# Patient Record
Sex: Male | Born: 1980
Health system: Southern US, Community
[De-identification: ages and names within clinical notes are randomized; demographics above are authoritative.]

## PROBLEM LIST (undated history)

## (undated) DIAGNOSIS — E079 Disorder of thyroid, unspecified: Secondary | ICD-10-CM

## (undated) DIAGNOSIS — C189 Malignant neoplasm of colon, unspecified: Secondary | ICD-10-CM

## (undated) DIAGNOSIS — I219 Acute myocardial infarction, unspecified: Secondary | ICD-10-CM

## (undated) DIAGNOSIS — F419 Anxiety disorder, unspecified: Secondary | ICD-10-CM

## (undated) DIAGNOSIS — M21371 Foot drop, right foot: Secondary | ICD-10-CM

## (undated) DIAGNOSIS — F4310 Post-traumatic stress disorder, unspecified: Secondary | ICD-10-CM

## (undated) DIAGNOSIS — I2699 Other pulmonary embolism without acute cor pulmonale: Secondary | ICD-10-CM

## (undated) DIAGNOSIS — K5090 Crohn's disease, unspecified, without complications: Secondary | ICD-10-CM

## (undated) DIAGNOSIS — F32A Depression, unspecified: Secondary | ICD-10-CM

## (undated) HISTORY — DX: Depression, unspecified: F32.A

## (undated) HISTORY — PX: COLON SURGERY: SHX602

## (undated) HISTORY — DX: Anxiety disorder, unspecified: F41.9

## (undated) HISTORY — DX: Post-traumatic stress disorder, unspecified: F43.10

## (undated) HISTORY — PX: OSTOMY: SHX5997

## (undated) HISTORY — DX: Foot drop, right foot: M21.371

## (undated) HISTORY — PX: ABDOMINAL SURGERY: SHX537

## (undated) HISTORY — PX: SMALL INTESTINE SURGERY: SHX150

## (undated) HISTORY — DX: Acute myocardial infarction, unspecified: I21.9

---

## 2019-04-18 ENCOUNTER — Encounter (HOSPITAL_COMMUNITY): Payer: Self-pay | Admitting: Urgent Care

## 2019-04-18 ENCOUNTER — Other Ambulatory Visit: Payer: Self-pay

## 2019-04-18 ENCOUNTER — Ambulatory Visit (HOSPITAL_COMMUNITY)
Admission: EM | Admit: 2019-04-18 | Discharge: 2019-04-18 | Disposition: A | Discharge status: Home / Self Care | Payer: Self-pay | Attending: Urgent Care | Admitting: Urgent Care

## 2019-04-18 DIAGNOSIS — K047 Periapical abscess without sinus: Secondary | ICD-10-CM

## 2019-04-18 DIAGNOSIS — R220 Localized swelling, mass and lump, head: Secondary | ICD-10-CM

## 2019-04-18 DIAGNOSIS — R519 Headache, unspecified: Secondary | ICD-10-CM

## 2019-04-18 HISTORY — DX: Disorder of thyroid, unspecified: E07.9

## 2019-04-18 HISTORY — DX: Crohn's disease, unspecified, without complications: K50.90

## 2019-04-18 MED ORDER — NAPROXEN 500 MG PO TABS: 500.0000 mg | ORAL_TABLET | Freq: Two times a day (BID) | ORAL | 0 refills | Status: DC

## 2019-04-18 MED ORDER — AMOXICILLIN-POT CLAVULANATE 875-125 MG PO TABS: 1.0000 | ORAL_TABLET | Freq: Two times a day (BID) | ORAL | 0 refills | Status: DC

## 2019-04-18 NOTE — ED Provider Notes (Signed)
[place]   MRN: [id number] DOB: 1980/10/06  Subjective:   Juan Hester is a 39 y.o. male presenting for 2-day history of cute onset moderate to severe worsening right sided facial pain, worse over lower jaw.  Patient states that he has broken teeth/molars on that side.  Denies having any dental care.  Denies taking chronic medications.  Allergies  Allergen Reactions  . Sulfa Antibiotics     Past Medical History:  Diagnosis Date  . Crohn disease ([place])   . Thyroid disease      Past Surgical History:  Procedure Laterality Date  . ABDOMINAL SURGERY    . COLON SURGERY    . SMALL INTESTINE SURGERY      History reviewed. No pertinent family history.  Social History   Tobacco Use  . Smoking status: Current Every Day Smoker    Packs/day: 1.00  . Smokeless tobacco: Never Used  Substance Use Topics  . Alcohol use: Yes  . Drug use: Not on file    Review of Systems  Constitutional: Negative for fever and malaise/fatigue.  HENT: Negative for congestion, ear pain, sinus pain and sore throat.   Eyes: Negative for discharge and redness.  Respiratory: Negative for cough, hemoptysis, shortness of breath and wheezing.   Cardiovascular: Negative for chest pain.  Gastrointestinal: Negative for abdominal pain, diarrhea, nausea and vomiting.  Genitourinary: Negative for dysuria, flank pain and hematuria.  Musculoskeletal: Negative for myalgias.  Skin: Negative for rash.  Neurological: Negative for dizziness, weakness and headaches.  Psychiatric/Behavioral: Negative for depression and substance abuse.     Objective:   Vitals: BP 118/79 (BP Location: Left Arm)   Pulse 78   Temp 98.2 F (36.8 C) (Oral)   Resp 16   Wt 260 lb (117.9 kg)   SpO2 99%   Physical Exam Constitutional:      General: He is not in acute distress.    Appearance: Normal appearance. He is well-developed and normal weight. He is not ill-appearing, toxic-appearing or diaphoretic.  HENT:     Head: Normocephalic and atraumatic.      Right Ear: Tympanic membrane, ear canal and external ear normal. There is no impacted cerumen.     Left Ear: Tympanic membrane, ear canal and external ear normal. There is no impacted cerumen.     Nose: Nose normal. No congestion or rhinorrhea.     Mouth/Throat:     Mouth: Mucous membranes are moist.     Pharynx: Oropharynx is clear. No oropharyngeal exudate or posterior oropharyngeal erythema.   Eyes:     General: No scleral icterus.       Right eye: No discharge.        Left eye: No discharge.     Extraocular Movements: Extraocular movements intact.     Conjunctiva/sclera: Conjunctivae normal.     Pupils: Pupils are equal, round, and reactive to light.  Cardiovascular:     Rate and Rhythm: Normal rate.  Pulmonary:     Effort: Pulmonary effort is normal.  Musculoskeletal:     Cervical back: Normal range of motion and neck supple. No rigidity. No muscular tenderness.  Neurological:     General: No focal deficit present.     Mental Status: He is alert and oriented to person, place, and time.  Psychiatric:        Mood and Affect: Mood normal.        Behavior: Behavior normal.        Thought Content: Thought content normal.  Judgment: Judgment normal.      Assessment and Plan :   1. Dental abscess   2. Facial pain   3. Facial swelling     Start Augmentin for dental abscess, use naproxen for pain and inflammation. Emphasized need for dental surgeon consult. Counseled patient on potential for adverse effects with medications prescribed/recommended today, strict ER and return-to-clinic precautions discussed, patient verbalized understanding.   Jaynee Eagles, [place] 04/18/19 1232

## 2019-04-18 NOTE — ED Triage Notes (Signed)
Pt states he has abscess on the left side of his face x 2 days.

## 2019-04-18 NOTE — Discharge Instructions (Addendum)
[facility] [phone] extension [phone]  Dr. Donn Pierini [phone] [place] [phone] [address] [facility] [place].  [facility] [phone] extension [phone] [place], [place], [address] First come first serve for the first 10 clients.  May do simple extractions only, no wisdom teeth or surgery.  You may try the second for Thursday of the month starting at [employer] You may call the school to see if they are still helping to provide dental care for emergent cases.

## 2019-04-26 ENCOUNTER — Encounter (HOSPITAL_COMMUNITY): Payer: Self-pay | Admitting: Emergency Medicine

## 2019-04-26 ENCOUNTER — Other Ambulatory Visit: Payer: Self-pay

## 2019-04-26 ENCOUNTER — Emergency Department (HOSPITAL_COMMUNITY)
Admission: EM | Admit: 2019-04-26 | Discharge: 2019-04-27 | Disposition: A | Discharge status: Home / Self Care | Payer: Self-pay | Attending: Emergency Medicine | Admitting: Emergency Medicine

## 2019-04-26 DIAGNOSIS — F17210 Nicotine dependence, cigarettes, uncomplicated: Secondary | ICD-10-CM | POA: Insufficient documentation

## 2019-04-26 DIAGNOSIS — Z433 Encounter for attention to colostomy: Secondary | ICD-10-CM | POA: Insufficient documentation

## 2019-04-26 DIAGNOSIS — Z85038 Personal history of other malignant neoplasm of large intestine: Secondary | ICD-10-CM | POA: Insufficient documentation

## 2019-04-26 NOTE — ED Triage Notes (Signed)
Patient requesting flange for his colostomy , he ran out of his supplies this week .

## 2019-04-26 NOTE — ED Notes (Signed)
A11 sent to main supply.

## 2019-04-27 ENCOUNTER — Telehealth: Payer: Self-pay | Admitting: Surgery

## 2019-04-27 NOTE — ED Notes (Signed)
Patient verbalizes understanding of discharge instructions. Opportunity for questioning and answers were provided. Armband removed by staff, pt discharged from ED. Pt. ambulatory and discharged home.  

## 2019-04-27 NOTE — Telephone Encounter (Signed)
ED CM received call concerning needing assistance with obtaining colostomy bags. ED CM provided patient with 2 hollister bags from the charity supply.

## 2019-04-27 NOTE — ED Provider Notes (Signed)
[facility] EMERGENCY DEPARTMENT Provider Note   CSN: [id number] Arrival date & time: 04/26/19  1839     History Chief Complaint  Patient presents with  . Requesting Colostomy Supplies    Juan Hester is a 39 y.o. male.  The history is provided by the patient and medical records.    39 y.o. M with hx of thryoid disease, crohn's disease, colon cancer s/p colostomy, presenting to the ED needing flange for colostomy bag.  States he was recently released from prison and currently does not have any insurance to help get his supplies.  He did sent off paperwork for his medicaid today.  He has 2 boxes of bags at home but no flanges.  He has interviews this week but cannot leave the house without having bag attached.  No other issues/complaints.  Flange [phone] is what he generally uses.  Past Medical History:  Diagnosis Date  . Crohn disease ([place])   . Thyroid disease     There are no problems to display for this patient.   Past Surgical History:  Procedure Laterality Date  . ABDOMINAL SURGERY    . COLON SURGERY    . SMALL INTESTINE SURGERY         No family history on file.  Social History   Tobacco Use  . Smoking status: Current Every Day Smoker    Packs/day: 1.00  . Smokeless tobacco: Never Used  Substance Use Topics  . Alcohol use: Yes  . Drug use: Not on file    Home Medications Prior to Admission medications   Medication Sig Start Date End Date Taking? Authorizing Provider  amoxicillin-clavulanate (AUGMENTIN) 875-125 MG tablet Take 1 tablet by mouth every 12 (twelve) hours. 04/18/19   Jaynee Eagles, PA-C  naproxen (NAPROSYN) 500 MG tablet Take 1 tablet (500 mg total) by mouth 2 (two) times daily. 04/18/19   Jaynee Eagles, PA-C    Allergies    Sulfa antibiotics  Review of Systems   Review of Systems  Constitutional:       Colostomy supplies  All other systems reviewed and are negative.   Physical Exam Updated Vital Signs BP 135/85 (BP  Location: Right Arm)   Pulse 79   Temp 97.8 F (36.6 C) (Oral)   Resp 13   SpO2 98%   Physical Exam Vitals and nursing note reviewed.  Constitutional:      Appearance: He is well-developed.  HENT:     Head: Normocephalic and atraumatic.  Eyes:     Conjunctiva/sclera: Conjunctivae normal.     Pupils: Pupils are equal, round, and reactive to light.  Cardiovascular:     Rate and Rhythm: Normal rate and regular rhythm.     Heart sounds: Normal heart sounds.  Pulmonary:     Effort: Pulmonary effort is normal.     Breath sounds: Normal breath sounds.  Abdominal:     General: Bowel sounds are normal.     Palpations: Abdomen is soft.  Musculoskeletal:        General: Normal range of motion.     Cervical back: Normal range of motion.  Skin:    General: Skin is warm and dry.  Neurological:     Mental Status: He is alert and oriented to person, place, and time.     ED Results / Procedures / Treatments   Labs (all labs ordered are listed, but only abnormal results are displayed) Labs Reviewed - No data to display  EKG None  Radiology  No results found.  Procedures Procedures (including critical care time)  Medications Ordered in ED Medications - No data to display  ED Course  I have reviewed the triage vital signs and the nursing notes.  Pertinent labs & imaging results that were available during my care of the patient were reviewed by me and considered in my medical decision making (see chart for details).    MDM Rules/Calculators/A&P  39 year old male presenting to the ED requesting supplies for his colostomy. History of colon cancer recently released from prison, does not have any insurance or medical care set up currently. He did send off paperwork for Medicaid but knows there will be a bit of a wait. He has colostomy bags but no flange.  He uses #[id number] but we do not have that particular one at this facility so we have offered most similar.  I have messaged case  management to help facilitate getting proper supplies arranged.  He can try to follow-up at the wellness clinic for now.  He may return here for any new/acute changes.  Final Clinical Impression(s) / ED Diagnoses Final diagnoses:  Encounter for attention to colostomy [facility])    Rx / DC Orders ED Discharge Orders    None       Larene Pickett, PA-C 04/27/19 0319    Merryl Hacker, MD 04/27/19 [phone]

## 2019-04-27 NOTE — Discharge Instructions (Addendum)
I have placed consult to case management to see if we can help get you the supplies you need.  They will contact you in the morning to help facilitate this. In the meantime, you can try to follow-up at the free clinic.  I have attached their contact information.

## 2019-05-05 ENCOUNTER — Ambulatory Visit: Payer: Self-pay | Attending: Family Medicine | Admitting: Physician Assistant

## 2019-05-05 ENCOUNTER — Other Ambulatory Visit: Payer: Self-pay

## 2019-05-05 DIAGNOSIS — E079 Disorder of thyroid, unspecified: Secondary | ICD-10-CM

## 2019-05-05 DIAGNOSIS — Z433 Encounter for attention to colostomy: Secondary | ICD-10-CM

## 2019-05-05 DIAGNOSIS — K50919 Crohn's disease, unspecified, with unspecified complications: Secondary | ICD-10-CM

## 2019-05-05 DIAGNOSIS — Z131 Encounter for screening for diabetes mellitus: Secondary | ICD-10-CM

## 2019-05-05 DIAGNOSIS — Z13220 Encounter for screening for lipoid disorders: Secondary | ICD-10-CM

## 2019-05-05 NOTE — Progress Notes (Signed)
Patient ID: Juan Hester, male   DOB: 1981/03/15, 39 y.o.   MRN: [id number]   Virtual Visit via Telephone Note  I connected with Juan Hester on 05/05/19 at 10:50 AM EST by telephone and verified that I am speaking with the correct person using two identifiers.   I discussed the limitations, risks, security and privacy concerns of performing an evaluation and management service by telephone and the availability of in person appointments. I also discussed with the patient that there may be a patient responsible charge related to this service. The patient expressed understanding and agreed to proceed.   History of Present Illness: Patient needing to establish care after release from prison.  Needs help getting colostomy supplies.  Needs labs.  Really hasn't had regular medical care in a while.  He has drank soda this morning but has not eaten.  Denies any other concerns or issues today.  No fever.  No abdominal pain.  Seen in ED  04/26/2019: 39 y.o. M with hx of thryoid disease, crohn's disease, colon cancer s/p colostomy, presenting to the ED needing flange for colostomy bag.  States he was recently released from prison and currently does not have any insurance to help get his supplies.  He did sent off paperwork for his medicaid today.  He has 2 boxes of bags at home but no flanges.  He has interviews this week but cannot leave the house without having bag attached.  No other issues/complaints.   Observations/Objective:  NAD.  A&Ox3   Assessment and Plan: 1. Crohn's disease with complication, unspecified gastrointestinal tract location [facility]) Supplies given.  Currently applying for medicaid.  I also advised him - Comprehensive metabolic panel - CBC with Differential/Platelet - Ambulatory referral to Social Work  2. Colostomy, evaluate ([place]) Supplies given - CBC with Differential/Platelet - Ambulatory referral to Social Work  3. Thyroid disease Was never put on meds-will assess -  Thyroid Panel With TSH  4. Screening cholesterol level - Lipid panel  5. Screening for diabetes mellitus I have had a lengthy discussion and provided education about insulin resistance and the intake of too much sugar/refined carbohydrates.  I have advised the patient to work at a goal of eliminating sugary drinks, candy, desserts, sweets, refined sugars, processed foods, and white carbohydrates.  The patient expresses understanding.   - Hemoglobin A1c    Follow Up Instructions: Assign PCP 4-6 weeks   I discussed the assessment and treatment plan with the patient. The patient was provided an opportunity to ask questions and all were answered. The patient agreed with the plan and demonstrated an understanding of the instructions.   The patient was advised to call back or seek an in-person evaluation if the symptoms worsen or if the condition fails to improve as anticipated.  I provided 11 minutes of non-face-to-face time during this encounter.   Freeman Caldron, PA-C

## 2019-05-05 NOTE — Progress Notes (Signed)
Pt. Is here for a hospital follow up.

## 2019-05-06 LAB — CBC WITH DIFFERENTIAL/PLATELET
Basophils Absolute: 0.1 10*3/uL (ref 0.0–0.2)
Basos: 1 %
EOS (ABSOLUTE): 0.2 10*3/uL (ref 0.0–0.4)
Eos: 2 %
Hematocrit: 47.4 % (ref 37.5–51.0)
Hemoglobin: 16.2 g/dL (ref 13.0–17.7)
Immature Grans (Abs): 0.1 10*3/uL (ref 0.0–0.1)
Immature Granulocytes: 1 %
Lymphocytes Absolute: 1.3 10*3/uL (ref 0.7–3.1)
Lymphs: 15 %
MCH: 30.5 pg (ref 26.6–33.0)
MCHC: 34.2 g/dL (ref 31.5–35.7)
MCV: 89 fL (ref 79–97)
Monocytes Absolute: 0.8 10*3/uL (ref 0.1–0.9)
Monocytes: 9 %
Neutrophils Absolute: 6.2 10*3/uL (ref 1.4–7.0)
Neutrophils: 72 %
Platelets: 202 10*3/uL (ref 150–450)
RBC: 5.32 x10E6/uL (ref 4.14–5.80)
RDW: 14.4 % (ref 11.6–15.4)
WBC: 8.7 10*3/uL (ref 3.4–10.8)

## 2019-05-06 LAB — COMPREHENSIVE METABOLIC PANEL
ALT: 61 IU/L — ABNORMAL HIGH (ref 0–44)
AST: 44 IU/L — ABNORMAL HIGH (ref 0–40)
Albumin/Globulin Ratio: 1.5 (ref 1.2–2.2)
Albumin: 4.4 g/dL (ref 4.0–5.0)
Alkaline Phosphatase: 164 IU/L — ABNORMAL HIGH (ref 39–117)
BUN/Creatinine Ratio: 13 (ref 9–20)
BUN: 16 mg/dL (ref 6–20)
Bilirubin Total: 0.4 mg/dL (ref 0.0–1.2)
CO2: 20 mmol/L (ref 20–29)
Calcium: 9.1 mg/dL (ref 8.7–10.2)
Chloride: 106 mmol/L (ref 96–106)
Creatinine, Ser: 1.28 mg/dL — ABNORMAL HIGH (ref 0.76–1.27)
GFR calc Af Amer: 82 mL/min/{1.73_m2} (ref >59)
GFR calc non Af Amer: 71 mL/min/{1.73_m2} (ref >59)
Globulin, Total: 3 g/dL (ref 1.5–4.5)
Glucose: 78 mg/dL (ref 65–99)
Potassium: 4 mmol/L (ref 3.5–5.2)
Sodium: 142 mmol/L (ref 134–144)
Total Protein: 7.4 g/dL (ref 6.0–8.5)

## 2019-05-06 LAB — LIPID PANEL
Chol/HDL Ratio: 2.8 ratio (ref 0.0–5.0)
Cholesterol, Total: 155 mg/dL (ref 100–199)
HDL: 55 mg/dL (ref >39)
LDL Chol Calc (NIH): 67 mg/dL (ref 0–99)
Triglycerides: 202 mg/dL — ABNORMAL HIGH (ref 0–149)
VLDL Cholesterol Cal: 33 mg/dL (ref 5–40)

## 2019-05-06 LAB — THYROID PANEL WITH TSH
Free Thyroxine Index: 3 (ref 1.2–4.9)
T3 Uptake Ratio: 33 % (ref 24–39)
T4, Total: 9 ug/dL (ref 4.5–12.0)
TSH: 0.493 u[IU]/mL (ref 0.450–4.500)

## 2019-05-06 LAB — HEMOGLOBIN A1C
Est. average glucose Bld gHb Est-mCnc: 97 mg/dL
Hgb A1c MFr Bld: 5 % (ref 4.8–5.6)

## 2019-05-07 ENCOUNTER — Telehealth (INDEPENDENT_AMBULATORY_CARE_PROVIDER_SITE_OTHER): Payer: Self-pay

## 2019-05-07 NOTE — Telephone Encounter (Signed)
Patient verified date of birth. He was advised to drink more water as his kidney test appears like he is a little dehydrated. Liver tests are slightly elevated; patient advised to avoid alcohol and products containing tylenol. All other results normal. He verbalized understanding of results. Nat Christen, CMA

## 2019-05-07 NOTE — Telephone Encounter (Signed)
-----   Message from [place] Donovan, [place] sent at 05/06/2019 10:56 AM EST ----- Please call patient.  Tell him to drink more water as his kidney test appears like he is a little dehydrated.  Avoid alcohol and products containing tylenol as liver tests are slightly elevated.  No anemia/blood count is normal.  No diabetes/blood sugars are normal.  Electrolytes are normal.  Thyroid studies are also normal.  Follow-up as planned.  Thanks, Freeman Caldron, PA-C

## 2019-05-22 ENCOUNTER — Emergency Department (HOSPITAL_COMMUNITY): Payer: Self-pay

## 2019-05-22 ENCOUNTER — Emergency Department (HOSPITAL_COMMUNITY)
Admission: EM | Admit: 2019-05-22 | Discharge: 2019-05-22 | Disposition: A | Discharge status: Home / Self Care | Payer: Self-pay | Attending: Emergency Medicine | Admitting: Emergency Medicine

## 2019-05-22 ENCOUNTER — Encounter (HOSPITAL_COMMUNITY): Payer: Self-pay | Admitting: *Deleted

## 2019-05-22 ENCOUNTER — Other Ambulatory Visit: Payer: Self-pay

## 2019-05-22 DIAGNOSIS — F17210 Nicotine dependence, cigarettes, uncomplicated: Secondary | ICD-10-CM | POA: Insufficient documentation

## 2019-05-22 DIAGNOSIS — C7800 Secondary malignant neoplasm of unspecified lung: Secondary | ICD-10-CM | POA: Insufficient documentation

## 2019-05-22 DIAGNOSIS — Z79899 Other long term (current) drug therapy: Secondary | ICD-10-CM | POA: Insufficient documentation

## 2019-05-22 DIAGNOSIS — M549 Dorsalgia, unspecified: Secondary | ICD-10-CM

## 2019-05-22 DIAGNOSIS — R202 Paresthesia of skin: Secondary | ICD-10-CM | POA: Insufficient documentation

## 2019-05-22 DIAGNOSIS — Z85038 Personal history of other malignant neoplasm of large intestine: Secondary | ICD-10-CM | POA: Insufficient documentation

## 2019-05-22 DIAGNOSIS — M545 Low back pain: Secondary | ICD-10-CM | POA: Insufficient documentation

## 2019-05-22 DIAGNOSIS — R109 Unspecified abdominal pain: Secondary | ICD-10-CM | POA: Insufficient documentation

## 2019-05-22 DIAGNOSIS — I2699 Other pulmonary embolism without acute cor pulmonale: Secondary | ICD-10-CM | POA: Insufficient documentation

## 2019-05-22 LAB — CBC WITH DIFFERENTIAL/PLATELET
Abs Immature Granulocytes: 0.06 10*3/uL (ref 0.00–0.07)
Basophils Absolute: 0.1 10*3/uL (ref 0.0–0.1)
Basophils Relative: 1 %
Eosinophils Absolute: 0.4 10*3/uL (ref 0.0–0.5)
Eosinophils Relative: 4 %
HCT: 45.9 % (ref 39.0–52.0)
Hemoglobin: 16.5 g/dL (ref 13.0–17.0)
Immature Granulocytes: 1 %
Lymphocytes Relative: 9 %
Lymphs Abs: 1 10*3/uL (ref 0.7–4.0)
MCH: 30.2 pg (ref 26.0–34.0)
MCHC: 35.9 g/dL (ref 30.0–36.0)
MCV: 83.9 fL (ref 80.0–100.0)
Monocytes Absolute: 0.9 10*3/uL (ref 0.1–1.0)
Monocytes Relative: 8 %
Neutro Abs: 8.6 10*3/uL — ABNORMAL HIGH (ref 1.7–7.7)
Neutrophils Relative %: 77 %
Platelets: 199 10*3/uL (ref 150–400)
RBC: 5.47 MIL/uL (ref 4.22–5.81)
RDW: 13.6 % (ref 11.5–15.5)
WBC: 11 10*3/uL — ABNORMAL HIGH (ref 4.0–10.5)
nRBC: 0 % (ref 0.0–0.2)

## 2019-05-22 LAB — COMPREHENSIVE METABOLIC PANEL
ALT: 58 U/L — ABNORMAL HIGH (ref 0–44)
AST: 47 U/L — ABNORMAL HIGH (ref 15–41)
Albumin: 3.6 g/dL (ref 3.5–5.0)
Alkaline Phosphatase: 161 U/L — ABNORMAL HIGH (ref 38–126)
Anion gap: 13 (ref 5–15)
BUN: 13 mg/dL (ref 6–20)
CO2: 22 mmol/L (ref 22–32)
Calcium: 8.7 mg/dL — ABNORMAL LOW (ref 8.9–10.3)
Chloride: 105 mmol/L (ref 98–111)
Creatinine, Ser: 1.22 mg/dL (ref 0.61–1.24)
GFR calc Af Amer: >60 mL/min (ref >60)
GFR calc non Af Amer: >60 mL/min (ref >60)
Glucose, Bld: 102 mg/dL — ABNORMAL HIGH (ref 70–99)
Potassium: 3.6 mmol/L (ref 3.5–5.1)
Sodium: 140 mmol/L (ref 135–145)
Total Bilirubin: 0.8 mg/dL (ref 0.3–1.2)
Total Protein: 7.5 g/dL (ref 6.5–8.1)

## 2019-05-22 LAB — D-DIMER, QUANTITATIVE: D-Dimer, Quant: 0.75 ug/mL-FEU — ABNORMAL HIGH (ref 0.00–0.50)

## 2019-05-22 MED ORDER — IOHEXOL 300 MG/ML  SOLN
125.0000 mL | Freq: Once | INTRAMUSCULAR | Status: AC | PRN
  Administered 2019-05-22: 125 mL via INTRAVENOUS

## 2019-05-22 MED ORDER — ONDANSETRON HCL 4 MG/2ML IJ SOLN
4.0000 mg | Freq: Once | INTRAMUSCULAR | Status: AC
  Administered 2019-05-22: 19:00:00 4 mg via INTRAVENOUS
  Filled 2019-05-22: qty 2

## 2019-05-22 MED ORDER — FENTANYL CITRATE (PF) 100 MCG/2ML IJ SOLN
100.0000 ug | Freq: Once | INTRAMUSCULAR | Status: AC
  Administered 2019-05-22: 19:00:00 100 ug via INTRAVENOUS
  Filled 2019-05-22: qty 2

## 2019-05-22 MED ORDER — SODIUM CHLORIDE 0.9 % IV BOLUS
1000.0000 mL | Freq: Once | INTRAVENOUS | Status: AC
  Administered 2019-05-22: 19:00:00 1000 mL via INTRAVENOUS

## 2019-05-22 MED ORDER — RIVAROXABAN (XARELTO) VTE STARTER PACK (15 & 20 MG): ORAL_TABLET | ORAL | 0 refills | Status: DC

## 2019-05-22 MED ORDER — METHOCARBAMOL 500 MG PO TABS: 500.0000 mg | ORAL_TABLET | Freq: Two times a day (BID) | ORAL | 0 refills | Status: DC

## 2019-05-22 MED ORDER — RIVAROXABAN 15 MG PO TABS
15.0000 mg | ORAL_TABLET | Freq: Once | ORAL | Status: AC
  Administered 2019-05-22: 15 mg via ORAL
  Filled 2019-05-22: qty 1

## 2019-05-22 MED ORDER — IOHEXOL 350 MG/ML SOLN
100.0000 mL | Freq: Once | INTRAVENOUS | Status: AC | PRN
  Administered 2019-05-22: 23:00:00 100 mL via INTRAVENOUS

## 2019-05-22 NOTE — ED Notes (Signed)
Pt pulse ox post ambulation was 98% on RA.

## 2019-05-22 NOTE — ED Provider Notes (Addendum)
[facility] EMERGENCY DEPARTMENT Provider Note   CSN: [id number] Arrival date & time: 05/22/19  1545     History Chief Complaint  Patient presents with  . Back Pain    Juan Hester is a 39 y.o. male past medical history of Crohn disease, colon cancer with colostomy, thyroid disease who presents for evaluation of left-sided back pain that began yesterday.  He states that he was standing at the kitchen yesterday and said that he started having some left lower back pain.  He felt like it spasmed hand caught him for about 20 to 30 seconds.  He states that he has continued to have pain.  He has been able to walk but does report worsening pain with ambulation.  He states that today, he felt like the pain was shooting up his left side of his back and into his arm.  He also reports feeling like his left upper extremity is "tingly.  He has no difficulty moving the arm but just states it "feels weird."  He denies any preceding trauma, injury to his back.  He does have a history of colon cancer and he told me that the cancer recently spread to his lungs.  He states he has a an appointment with a pulmonology and oncology doctor here but has not yet seen them.  He states he did have chemo but no radiation.  He denies any brain radiation. Denies fevers, weight loss, weakness of upper and lower extremities, bowel/bladder incontinence, saddle anesthesia, history of back surgery, history of IVDA.  He denies any abdominal pain, nausea/vomiting, dysuria, hematuria, chest pain, difficulty breathing.  The history is provided by the patient.       Past Medical History:  Diagnosis Date  . Crohn disease ([place])   . Thyroid disease     Patient Active Problem List   Diagnosis Date Noted  . Crohn's disease with complication ([place]) 99/83/3825  . Colostomy, evaluate ([place]) 05/05/2019    Past Surgical History:  Procedure Laterality Date  . ABDOMINAL SURGERY    . COLON SURGERY    . SMALL  INTESTINE SURGERY         History reviewed. No pertinent family history.  Social History   Tobacco Use  . Smoking status: Current Every Day Smoker    Packs/day: 1.00  . Smokeless tobacco: Never Used  Substance Use Topics  . Alcohol use: Yes  . Drug use: Not on file    Home Medications Prior to Admission medications   Medication Sig Start Date End Date Taking? Authorizing Provider  acetaminophen (TYLENOL) 500 MG tablet Take 1,000 mg by mouth every 6 (six) hours as needed for mild pain, moderate pain or headache.   Yes [provider]  ARIPIPRAZOLE PO Take 1 tablet by mouth daily.   Yes [provider]  DULoxetine HCl (CYMBALTA PO) Take 1 capsule by mouth daily.   Yes [provider]  inFLIXimab (REMICADE) 100 MG injection Inject 100 mg into the vein every 6 (six) weeks.   Yes [provider]  loperamide (IMODIUM) 2 MG capsule Take 2 mg by mouth as needed for diarrhea or loose stools.   Yes [provider]  naproxen (NAPROSYN) 500 MG tablet Take 1 tablet (500 mg total) by mouth 2 (two) times daily. 04/18/19  Yes Jaynee Eagles, PA-C  SIMETHICONE PO Take 1 tablet by mouth as needed (gas).   Yes [provider]  methocarbamol (ROBAXIN) 500 MG tablet Take 1 tablet (500 mg  total) by mouth 2 (two) times daily. 05/22/19   Volanda Napoleon, PA-C  Rivaroxaban 15 & 20 MG TBPK Follow package directions: Take one 80m tablet by mouth twice a day. On day 22, switch to one 271mtablet once a day. Take with food. 05/22/19   LaVolanda NapoleonPA-C    Allergies    Azathioprine, Ciprofloxacin, Metronidazole, and Sulfa antibiotics  Review of Systems   Review of Systems  Constitutional: Negative for fever.  Respiratory: Negative for cough and shortness of breath.   Cardiovascular: Negative for chest pain.  Gastrointestinal: Negative for abdominal pain, nausea and vomiting.  Genitourinary: Negative for dysuria and hematuria.  Musculoskeletal:  Positive for back pain.  Neurological: Positive for numbness (tingling). Negative for weakness and headaches.  All other systems reviewed and are negative.   Physical Exam Updated Vital Signs BP (!) 135/98   Pulse 78   Temp 98.1 F (36.7 C) (Oral)   Resp 18   Ht 6' 1"  (1.854 m)   Wt 117.9 kg   SpO2 98%   BMI 34.30 kg/m   Physical Exam Vitals and nursing note reviewed.  Constitutional:      Appearance: Normal appearance. He is well-developed.  HENT:     Head: Normocephalic and atraumatic.  Eyes:     General: Lids are normal.     Conjunctiva/sclera: Conjunctivae normal.     Pupils: Pupils are equal, round, and reactive to light.  Neck:     Comments: Full flexion/extension and lateral movement of neck fully intact. No bony midline tenderness. No deformities or crepitus.  Cardiovascular:     Rate and Rhythm: Normal rate and regular rhythm.     Pulses: Normal pulses.          Radial pulses are 2+ on the right side and 2+ on the left side.     Heart sounds: Normal heart sounds. No murmur. No friction rub. No gallop.   Pulmonary:     Effort: Pulmonary effort is normal.     Breath sounds: Normal breath sounds.     Comments: Lungs clear to auscultation bilaterally.  Symmetric chest rise.  No wheezing, rales, rhonchi. Abdominal:     Palpations: Abdomen is soft. Abdomen is not rigid.     Tenderness: There is no abdominal tenderness. There is no guarding.     Comments: Abdomen is soft, non-distended, non-tender. No rigidity, No guarding. No peritoneal signs.  Musculoskeletal:        General: Normal range of motion.     Cervical back: Full passive range of motion without pain.     Comments: No midline T-spine tenderness.  Tenderness palpation noted to the midline lower lumbar region that extends into the paraspinal muscles of the lower lumbar region on the left.  No deformity or crepitus noted.  Skin:    General: Skin is warm and dry.     Capillary Refill: Capillary refill takes  less than 2 seconds.  Neurological:     Mental Status: He is alert and oriented to person, place, and time.     Comments: Cranial nerves III-XII intact Follows commands, Moves all extremities  5/5 strength to BUE and BLE  Patient states that he can feel me touching him on his left upper extremity.  But he states he has a "tingling" type sensation diffusely.  Otherwise sensation intact throughout all major nerve distributions No gait abnormalities  No slurred speech. No facial droop.   Psychiatric:  Speech: Speech normal.     ED Results / Procedures / Treatments   Labs (all labs ordered are listed, but only abnormal results are displayed) Labs Reviewed  COMPREHENSIVE METABOLIC PANEL - Abnormal; Notable for the following components:      Result Value   Glucose, Bld 102 (*)    Calcium 8.7 (*)    AST 47 (*)    ALT 58 (*)    Alkaline Phosphatase 161 (*)    All other components within normal limits  CBC WITH DIFFERENTIAL/PLATELET - Abnormal; Notable for the following components:   WBC 11.0 (*)    Neutro Abs 8.6 (*)    All other components within normal limits  D-DIMER, QUANTITATIVE (NOT AT [facility]) - Abnormal; Notable for the following components:   D-Dimer, Quant 0.75 (*)    All other components within normal limits    EKG None  Radiology CT Head Wo Contrast  Result Date: 05/22/2019 CLINICAL DATA:  History of cancer EXAM: CT HEAD WITHOUT CONTRAST TECHNIQUE: Contiguous axial images were obtained from the base of the skull through the vertex without intravenous contrast. COMPARISON:  None. FINDINGS: Brain: No acute intracranial abnormality. Specifically, no hemorrhage, hydrocephalus, mass lesion, acute infarction, or significant intracranial injury. Vascular: No hyperdense vessel or unexpected calcification. Skull: No acute calvarial abnormality. Sinuses/Orbits: Kozel thickening within the paranasal sinuses. No air-fluid levels. Orbital soft tissues unremarkable. Other: None  IMPRESSION: No intracranial abnormality. Chronic sinusitis. Electronically Signed   By: Rolm Baptise M.D.   On: 05/22/2019 20:35   CT Chest W Contrast  Result Date: 05/22/2019 CLINICAL DATA:  Mid back pain. Flank pain. History of Crohn's disease. EXAM: CT CHEST, ABDOMEN, AND PELVIS WITH CONTRAST CT THORACIC AND LUMBAR SPINE WITHOUT CONTRAST. TECHNIQUE: Multidetector CT imaging of the chest, abdomen and pelvis was performed following the standard protocol during bolus administration of intravenous contrast. Multiplanar CT images of the thoracic and lumbar spine were reconstructed from contemporary CT of the Chest, Abdomen, and Pelvis CONTRAST:  177m OMNIPAQUE IOHEXOL 300 MG/ML  SOLN COMPARISON:  None. FINDINGS: CT CHEST FINDINGS Cardiovascular: There is aneurysmal dilatation of the ascending aorta which currently measures approximately 4 cm. There is no evidence for a thoracic aortic dissection. There is a questionable acute pulmonary embolus involving a segmental branch of the left lower lobe (axial series 3, image 38). Evaluation for pulmonary emboli is significantly limited by technique and contrast timing. There is a left-sided Port-A-Cath with tip terminating near the cavoatrial junction. Mediastinum/Nodes: --No mediastinal or hilar lymphadenopathy. --No axillary lymphadenopathy. --No supraclavicular lymphadenopathy. --Normal thyroid gland. --The esophagus is unremarkable Lungs/Pleura: There is a 3 mm pulmonary nodule in the peripheral left lower lobe (axial series 4, image 118). The lungs are otherwise clear without evidence for pneumothorax or focal infiltrate. The trachea is unremarkable. Musculoskeletal: No chest wall abnormality. No acute or significant osseous findings. CT ABDOMEN PELVIS FINDINGS Hepatobiliary: The liver is normal. Cholelithiasis without acute inflammation.There is no biliary ductal dilation. Pancreas: Normal contours without ductal dilatation. No peripancreatic fluid collection.  Spleen: The spleen is enlarged measuring approximately 13.4 cm craniocaudad. Adrenals/Urinary Tract: --Adrenal glands: No adrenal hemorrhage. --Right kidney/ureter: No hydronephrosis or perinephric hematoma. --Left kidney/ureter: No hydronephrosis or perinephric hematoma. --Urinary bladder: Unremarkable. Stomach/Bowel: --Stomach/Duodenum: No hiatal hernia or other gastric abnormality. Normal duodenal course and caliber. --Small bowel: There is wall thickening without evidence for active inflammation involving the terminal ileum. This is consistent with the patient's history of Crohn's disease. There is no evidence for active Crohn's disease  on this exam. There is no evidence for a small bowel obstruction. --Colon: The patient is status post prior partial colectomy. There is a left lower quadrant colostomy in place. There is no evidence for obstruction. The visualized portions of the colon are relatively decompressed which likely accounts for some apparent wall thickening. There is a fat containing peristomal hernia. --Appendix: The appendix is not reliably identified and may be surgically absent. Vascular/Lymphatic: Normal course and caliber of the major abdominal vessels. --No retroperitoneal lymphadenopathy. --No mesenteric lymphadenopathy. --No pelvic or inguinal lymphadenopathy. Reproductive: Unremarkable Other: There is ill-defined soft tissue in the presacral region measuring approximately 4.5 x 4.6 cm. This may represent post treatment changes from prior partial colectomy. There is a small fat containing umbilical hernia. There is no significant free air or free fluid. Musculoskeletal. No acute displaced fractures. IMPRESSION: 1. Questionable acute pulmonary embolus involving a segmental branch of the left lower lobe. Evaluation for pulmonary emboli is significantly limited by technique and contrast timing. 2. Ascending thoracic aortic aneurysm currently measures approximately 4 cm. Recommend annual imaging  followup by CTA or MRA. This recommendation follows 2010 ACCF/AHA/AATS/ACR/ASA/SCA/SCAI/SIR/STS/SVM Guidelines for the Diagnosis and Management of Patients with Thoracic Aortic Disease. Circulation. 2010; 121: N629-B284. Aortic aneurysm NOS (ICD10-I71.9) 3. 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 There is a fat containing peristomal hernia without evidence for obstruction. There is no evidence for active Crohn's disease on this exam. 4. Ill-defined soft tissue in the presacral region may represent post treatment changes from prior partial colectomy. There are no prior studies to compare to. Comparison to outside prior studies would be useful to confirm stability of this finding. 5. Splenomegaly. 6. Cholelithiasis without evidence for acute cholecystitis. 7. No acute fracture involving the thoracic or lumbar spine. 8. There is a 3 mm pulmonary nodule in the left lower lobe. No follow-up needed if patient is low-risk. Non-contrast chest CT can be considered in 12 months if patient is high-risk. This recommendation follows the consensus statement: Guidelines for Management of Incidental Pulmonary Nodules Detected on CT Images: From the Fleischner Society 2017; Radiology 2017; 284:228-243. 9. Well-positioned left subclavian Port-A-Cath. These results were called by telephone at the time of interpretation on 05/22/2019 at 8:49 pm to provider [facility] , who verbally acknowledged these results. Electronically Signed   By: Constance Holster M.D.   On: 05/22/2019 20:54   CT Angio Chest PE W and/or Wo Contrast  Result Date: 05/22/2019 CLINICAL DATA:  Abnormal D-dimer. Back pain and flank pain. History of Crohn's disease. EXAM: CT ANGIOGRAPHY CHEST WITH CONTRAST TECHNIQUE: Multidetector CT imaging of the chest was performed using the standard protocol during bolus administration of intravenous contrast. Multiplanar CT image reconstructions and MIPs were obtained to evaluate the  vascular anatomy. CONTRAST:  12m OMNIPAQUE IOHEXOL 350 MG/ML SOLN COMPARISON:  None. FINDINGS: Cardiovascular: Pulmonary arterial opacification is excellent. The study shows a moderate sized embolus in the left lower lobe pulmonary arterial tree serving the lingular region. There is a single small embolus in the right lower lobe pulmonary arterial tree. No aortic pathology. Heart size is normal. No pericardial fluid. No coronary artery calcification. Mediastinum/Nodes: No mass or adenopathy. Lungs/Pleura: The lungs are clear. Subcentimeter air cyst in the posterior left lower lobe. No pleural disease. Upper Abdomen: Negative Musculoskeletal: Normal Review of the MIP images confirms the above findings. IMPRESSION: Examination is positive for a single identifiable embolus on each side, visible in the left lower lobe pulmonary arterial tree, involving  the branches serving the lingula and in the right lower lobe pulmonary artery branches. No massive or submassive embolic disease or evidence of right ventricular strain. Electronically Signed   By: Nelson Chimes M.D.   On: 05/22/2019 22:52   CT Cervical Spine Wo Contrast  Result Date: 05/22/2019 CLINICAL DATA:  History of cancer EXAM: CT CERVICAL SPINE WITHOUT CONTRAST TECHNIQUE: Multidetector CT imaging of the cervical spine was performed without intravenous contrast. Multiplanar CT image reconstructions were also generated. COMPARISON:  None. FINDINGS: Alignment: Normal Skull base and vertebrae: No acute fracture. No primary bone lesion or focal pathologic process. Soft tissues and spinal canal: No prevertebral fluid or swelling. No visible canal hematoma. Disc levels:  Normal Upper chest: Negative Other: None IMPRESSION: Normal study.  In Electronically Signed   By: Rolm Baptise M.D.   On: 05/22/2019 20:36   CT ABDOMEN PELVIS W CONTRAST  Result Date: 05/22/2019 CLINICAL DATA:  Mid back pain. Flank pain. History of Crohn's disease. EXAM: CT CHEST, ABDOMEN, AND  PELVIS WITH CONTRAST CT THORACIC AND LUMBAR SPINE WITHOUT CONTRAST. TECHNIQUE: Multidetector CT imaging of the chest, abdomen and pelvis was performed following the standard protocol during bolus administration of intravenous contrast. Multiplanar CT images of the thoracic and lumbar spine were reconstructed from contemporary CT of the Chest, Abdomen, and Pelvis CONTRAST:  159m OMNIPAQUE IOHEXOL 300 MG/ML  SOLN COMPARISON:  None. FINDINGS: CT CHEST FINDINGS Cardiovascular: There is aneurysmal dilatation of the ascending aorta which currently measures approximately 4 cm. There is no evidence for a thoracic aortic dissection. There is a questionable acute pulmonary embolus involving a segmental branch of the left lower lobe (axial series 3, image 38). Evaluation for pulmonary emboli is significantly limited by technique and contrast timing. There is a left-sided Port-A-Cath with tip terminating near the cavoatrial junction. Mediastinum/Nodes: --No mediastinal or hilar lymphadenopathy. --No axillary lymphadenopathy. --No supraclavicular lymphadenopathy. --Normal thyroid gland. --The esophagus is unremarkable Lungs/Pleura: There is a 3 mm pulmonary nodule in the peripheral left lower lobe (axial series 4, image 118). The lungs are otherwise clear without evidence for pneumothorax or focal infiltrate. The trachea is unremarkable. Musculoskeletal: No chest wall abnormality. No acute or significant osseous findings. CT ABDOMEN PELVIS FINDINGS Hepatobiliary: The liver is normal. Cholelithiasis without acute inflammation.There is no biliary ductal dilation. Pancreas: Normal contours without ductal dilatation. No peripancreatic fluid collection. Spleen: The spleen is enlarged measuring approximately 13.4 cm craniocaudad. Adrenals/Urinary Tract: --Adrenal glands: No adrenal hemorrhage. --Right kidney/ureter: No hydronephrosis or perinephric hematoma. --Left kidney/ureter: No hydronephrosis or perinephric hematoma. --Urinary  bladder: Unremarkable. Stomach/Bowel: --Stomach/Duodenum: No hiatal hernia or other gastric abnormality. Normal duodenal course and caliber. --Small bowel: There is wall thickening without evidence for active inflammation involving the terminal ileum. This is consistent with the patient's history of Crohn's disease. There is no evidence for active Crohn's disease on this exam. There is no evidence for a small bowel obstruction. --Colon: The patient is status post prior partial colectomy. There is a left lower quadrant colostomy in place. There is no evidence for obstruction. The visualized portions of the colon are relatively decompressed which likely accounts for some apparent wall thickening. There is a fat containing peristomal hernia. --Appendix: The appendix is not reliably identified and may be surgically absent. Vascular/Lymphatic: Normal course and caliber of the major abdominal vessels. --No retroperitoneal lymphadenopathy. --No mesenteric lymphadenopathy. --No pelvic or inguinal lymphadenopathy. Reproductive: Unremarkable Other: There is ill-defined soft tissue in the presacral region measuring approximately 4.5 x 4.6 cm. This may  represent post treatment changes from prior partial colectomy. There is a small fat containing umbilical hernia. There is no significant free air or free fluid. Musculoskeletal. No acute displaced fractures. IMPRESSION: 1. Questionable acute pulmonary embolus involving a segmental branch of the left lower lobe. Evaluation for pulmonary emboli is significantly limited by technique and contrast timing. 2. Ascending thoracic aortic aneurysm currently measures approximately 4 cm. Recommend annual imaging followup by CTA or MRA. This recommendation follows 2010 ACCF/AHA/AATS/ACR/ASA/SCA/SCAI/SIR/STS/SVM Guidelines for the Diagnosis and Management of Patients with Thoracic Aortic Disease. Circulation. 2010; 121: Y850-Y774. Aortic aneurysm NOS (ICD10-I71.9) 3. Status post prior partial  colectomy with left lower quadrant colostomy in place. There is a fat containing peristomal hernia without evidence for obstruction. There is no evidence for active Crohn's disease on this exam. 4. Ill-defined soft tissue in the presacral region may represent post treatment changes from prior partial colectomy. There are no prior studies to compare to. Comparison to outside prior studies would be useful to confirm stability of this finding. 5. Splenomegaly. 6. Cholelithiasis without evidence for acute cholecystitis. 7. No acute fracture involving the thoracic or lumbar spine. 8. There is a 3 mm pulmonary nodule in the left lower lobe. No follow-up needed if patient is low-risk. Non-contrast chest CT can be considered in 12 months if patient is high-risk. This recommendation follows the consensus statement: Guidelines for Management of Incidental Pulmonary Nodules Detected on CT Images: From the Fleischner Society 2017; Radiology 2017; 284:228-243. 9. Well-positioned left subclavian Port-A-Cath. These results were called by telephone at the time of interpretation on 05/22/2019 at 8:49 pm to provider [facility] , who verbally acknowledged these results. Electronically Signed   By: Constance Holster M.D.   On: 05/22/2019 20:54   CT T-SPINE NO CHARGE  Result Date: 05/22/2019 CLINICAL DATA:  Mid back pain. Flank pain. History of Crohn's disease. EXAM: CT CHEST, ABDOMEN, AND PELVIS WITH CONTRAST CT THORACIC AND LUMBAR SPINE WITHOUT CONTRAST. TECHNIQUE: Multidetector CT imaging of the chest, abdomen and pelvis was performed following the standard protocol during bolus administration of intravenous contrast. Multiplanar CT images of the thoracic and lumbar spine were reconstructed from contemporary CT of the Chest, Abdomen, and Pelvis CONTRAST:  114m OMNIPAQUE IOHEXOL 300 MG/ML  SOLN COMPARISON:  None. FINDINGS: CT CHEST FINDINGS Cardiovascular: There is aneurysmal dilatation of the ascending aorta which currently  measures approximately 4 cm. There is no evidence for a thoracic aortic dissection. There is a questionable acute pulmonary embolus involving a segmental branch of the left lower lobe (axial series 3, image 38). Evaluation for pulmonary emboli is significantly limited by technique and contrast timing. There is a left-sided Port-A-Cath with tip terminating near the cavoatrial junction. Mediastinum/Nodes: --No mediastinal or hilar lymphadenopathy. --No axillary lymphadenopathy. --No supraclavicular lymphadenopathy. --Normal thyroid gland. --The esophagus is unremarkable Lungs/Pleura: There is a 3 mm pulmonary nodule in the peripheral left lower lobe (axial series 4, image 118). The lungs are otherwise clear without evidence for pneumothorax or focal infiltrate. The trachea is unremarkable. Musculoskeletal: No chest wall abnormality. No acute or significant osseous findings. CT ABDOMEN PELVIS FINDINGS Hepatobiliary: The liver is normal. Cholelithiasis without acute inflammation.There is no biliary ductal dilation. Pancreas: Normal contours without ductal dilatation. No peripancreatic fluid collection. Spleen: The spleen is enlarged measuring approximately 13.4 cm craniocaudad. Adrenals/Urinary Tract: --Adrenal glands: No adrenal hemorrhage. --Right kidney/ureter: No hydronephrosis or perinephric hematoma. --Left kidney/ureter: No hydronephrosis or perinephric hematoma. --Urinary bladder: Unremarkable. Stomach/Bowel: --Stomach/Duodenum: No hiatal hernia or other gastric abnormality. Normal duodenal  course and caliber. --Small bowel: There is wall thickening without evidence for active inflammation involving the terminal ileum. This is consistent with the patient's history of Crohn's disease. There is no evidence for active Crohn's disease on this exam. There is no evidence for a small bowel obstruction. --Colon: The patient is status post prior partial colectomy. There is a left lower quadrant colostomy in place. There  is no evidence for obstruction. The visualized portions of the colon are relatively decompressed which likely accounts for some apparent wall thickening. There is a fat containing peristomal hernia. --Appendix: The appendix is not reliably identified and may be surgically absent. Vascular/Lymphatic: Normal course and caliber of the major abdominal vessels. --No retroperitoneal lymphadenopathy. --No mesenteric lymphadenopathy. --No pelvic or inguinal lymphadenopathy. Reproductive: Unremarkable Other: There is ill-defined soft tissue in the presacral region measuring approximately 4.5 x 4.6 cm. This may represent post treatment changes from prior partial colectomy. There is a small fat containing umbilical hernia. There is no significant free air or free fluid. Musculoskeletal. No acute displaced fractures. IMPRESSION: 1. Questionable acute pulmonary embolus involving a segmental branch of the left lower lobe. Evaluation for pulmonary emboli is significantly limited by technique and contrast timing. 2. Ascending thoracic aortic aneurysm currently measures approximately 4 cm. Recommend annual imaging followup by CTA or MRA. This recommendation follows 2010 ACCF/AHA/AATS/ACR/ASA/SCA/SCAI/SIR/STS/SVM Guidelines for the Diagnosis and Management of Patients with Thoracic Aortic Disease. Circulation. 2010; 121: B638-L373. Aortic aneurysm NOS (ICD10-I71.9) 3. 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 There is a fat containing peristomal hernia without evidence for obstruction. There is no evidence for active Crohn's disease on this exam. 4. Ill-defined soft tissue in the presacral region may represent post treatment changes from prior partial colectomy. There are no prior studies to compare to. Comparison to outside prior studies would be useful to confirm stability of this finding. 5. Splenomegaly. 6. Cholelithiasis without evidence for acute cholecystitis. 7. No acute fracture  involving the thoracic or lumbar spine. 8. There is a 3 mm pulmonary nodule in the left lower lobe. No follow-up needed if patient is low-risk. Non-contrast chest CT can be considered in 12 months if patient is high-risk. This recommendation follows the consensus statement: Guidelines for Management of Incidental Pulmonary Nodules Detected on CT Images: From the Fleischner Society 2017; Radiology 2017; 284:228-243. 9. Well-positioned left subclavian Port-A-Cath. These results were called by telephone at the time of interpretation on 05/22/2019 at 8:49 pm to provider [facility] , who verbally acknowledged these results. Electronically Signed   By: Constance Holster M.D.   On: 05/22/2019 20:54   CT L-SPINE NO CHARGE  Result Date: 05/22/2019 CLINICAL DATA:  Mid back pain. Flank pain. History of Crohn's disease. EXAM: CT CHEST, ABDOMEN, AND PELVIS WITH CONTRAST CT THORACIC AND LUMBAR SPINE WITHOUT CONTRAST. TECHNIQUE: Multidetector CT imaging of the chest, abdomen and pelvis was performed following the standard protocol during bolus administration of intravenous contrast. Multiplanar CT images of the thoracic and lumbar spine were reconstructed from contemporary CT of the Chest, Abdomen, and Pelvis CONTRAST:  137m OMNIPAQUE IOHEXOL 300 MG/ML  SOLN COMPARISON:  None. FINDINGS: CT CHEST FINDINGS Cardiovascular: There is aneurysmal dilatation of the ascending aorta which currently measures approximately 4 cm. There is no evidence for a thoracic aortic dissection. There is a questionable acute pulmonary embolus involving a segmental branch of the left lower lobe (axial series 3, image 38). Evaluation for pulmonary emboli is significantly limited by technique and contrast timing. There  is a left-sided Port-A-Cath with tip terminating near the cavoatrial junction. Mediastinum/Nodes: --No mediastinal or hilar lymphadenopathy. --No axillary lymphadenopathy. --No supraclavicular lymphadenopathy. --Normal thyroid gland.  --The esophagus is unremarkable Lungs/Pleura: There is a 3 mm pulmonary nodule in the peripheral left lower lobe (axial series 4, image 118). The lungs are otherwise clear without evidence for pneumothorax or focal infiltrate. The trachea is unremarkable. Musculoskeletal: No chest wall abnormality. No acute or significant osseous findings. CT ABDOMEN PELVIS FINDINGS Hepatobiliary: The liver is normal. Cholelithiasis without acute inflammation.There is no biliary ductal dilation. Pancreas: Normal contours without ductal dilatation. No peripancreatic fluid collection. Spleen: The spleen is enlarged measuring approximately 13.4 cm craniocaudad. Adrenals/Urinary Tract: --Adrenal glands: No adrenal hemorrhage. --Right kidney/ureter: No hydronephrosis or perinephric hematoma. --Left kidney/ureter: No hydronephrosis or perinephric hematoma. --Urinary bladder: Unremarkable. Stomach/Bowel: --Stomach/Duodenum: No hiatal hernia or other gastric abnormality. Normal duodenal course and caliber. --Small bowel: There is wall thickening without evidence for active inflammation involving the terminal ileum. This is consistent with the patient's history of Crohn's disease. There is no evidence for active Crohn's disease on this exam. There is no evidence for a small bowel obstruction. --Colon: The patient is status post prior partial colectomy. There is a left lower quadrant colostomy in place. There is no evidence for obstruction. The visualized portions of the colon are relatively decompressed which likely accounts for some apparent wall thickening. There is a fat containing peristomal hernia. --Appendix: The appendix is not reliably identified and may be surgically absent. Vascular/Lymphatic: Normal course and caliber of the major abdominal vessels. --No retroperitoneal lymphadenopathy. --No mesenteric lymphadenopathy. --No pelvic or inguinal lymphadenopathy. Reproductive: Unremarkable Other: There is ill-defined soft tissue in  the presacral region measuring approximately 4.5 x 4.6 cm. This may represent post treatment changes from prior partial colectomy. There is a small fat containing umbilical hernia. There is no significant free air or free fluid. Musculoskeletal. No acute displaced fractures. IMPRESSION: 1. Questionable acute pulmonary embolus involving a segmental branch of the left lower lobe. Evaluation for pulmonary emboli is significantly limited by technique and contrast timing. 2. Ascending thoracic aortic aneurysm currently measures approximately 4 cm. Recommend annual imaging followup by CTA or MRA. This recommendation follows 2010 ACCF/AHA/AATS/ACR/ASA/SCA/SCAI/SIR/STS/SVM Guidelines for the Diagnosis and Management of Patients with Thoracic Aortic Disease. Circulation. 2010; 121: P295-J884. Aortic aneurysm NOS (ICD10-I71.9) 3. 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 There is a fat containing peristomal hernia without evidence for obstruction. There is no evidence for active Crohn's disease on this exam. 4. Ill-defined soft tissue in the presacral region may represent post treatment changes from prior partial colectomy. There are no prior studies to compare to. Comparison to outside prior studies would be useful to confirm stability of this finding. 5. Splenomegaly. 6. Cholelithiasis without evidence for acute cholecystitis. 7. No acute fracture involving the thoracic or lumbar spine. 8. There is a 3 mm pulmonary nodule in the left lower lobe. No follow-up needed if patient is low-risk. Non-contrast chest CT can be considered in 12 months if patient is high-risk. This recommendation follows the consensus statement: Guidelines for Management of Incidental Pulmonary Nodules Detected on CT Images: From the Fleischner Society 2017; Radiology 2017; 284:228-243. 9. Well-positioned left subclavian Port-A-Cath. These results were called by telephone at the time of interpretation on 05/22/2019  at 8:49 pm to provider [facility] , who verbally acknowledged these results. Electronically Signed   By: Constance Holster M.D.   On: 05/22/2019 20:54  Procedures Procedures (including critical care time)  Medications Ordered in ED Medications  sodium chloride 0.9 % bolus 1,000 mL (0 mLs Intravenous Stopped 05/22/19 2220)  fentaNYL (SUBLIMAZE) injection 100 mcg (100 mcg Intravenous Given 05/22/19 1854)  ondansetron (ZOFRAN) injection 4 mg (4 mg Intravenous Given 05/22/19 1854)  iohexol (OMNIPAQUE) 300 MG/ML solution 125 mL (125 mLs Intravenous Contrast Given 05/22/19 1917)  iohexol (OMNIPAQUE) 350 MG/ML injection 100 mL (100 mLs Intravenous Contrast Given 05/22/19 2236)  Rivaroxaban (XARELTO) tablet 15 mg (15 mg Oral Given 05/22/19 2334)    ED Course  I have reviewed the triage vital signs and the nursing notes.  Pertinent labs & imaging results that were available during my care of the patient were reviewed by me and considered in my medical decision making (see chart for details).    MDM Rules/Calculators/A&P                      39 year old male who presents for evaluation of back pain that began yesterday.  No preceding trauma, injury, fall.  No fevers.  No numbness/weakness.  On initial ED arrival, he is afebrile, nontoxic-appearing.  Vital signs are stable.  He has midline tenderness noted to the lower lumbar region that extend the left paraspinal muscles.  He has no midline T-spine tenderness but he feels like the pain shoots up into his left side.  Reports some associated tingling in his left upper extremity.  He does have a history of colon cancer for which he received chemo and had a colostomy.  He did not get any brain radiation.  He states he was recently told that the cancer is is in his lungs nail but has not started treatment.  Given his extensive history, will plan for imaging to ensure that this is not metastasis that is causing his symptoms.  CBC shows slight leukocytosis of  11.0.  CMP shows alk phos of 161.  Slight elevation of his liver enzymes at AST's-47 and ALT-58.  CT head negative for intracranial abnormality. CT C spine negative for any acute normality.  CT chest/abd/pelvis with contrast noted to have a questionable acute PE involving a segmental branch of the left lower lobe. He also has a ascending thoracic aortic aneurysm currently measures at 4 cm. He has known colostomy in place. He has also a 3 mm pulmonary nodule noted in the left lower lobe. No acute findings for T or L spine tenderness noted.  I discussed this finding with the radiologist.  The radiologist recommended obtaining a D-dimer.  If the D-dimer is negative, no further intervention needed but of his was positive, he would need a CTA to ensure that this is not a blood clot.  I discussed this with patient.  I discussed with him regarding all the results and follow-up.  He knew about the pulmonary nodules and stated that he had been told them before.  He did not know about the ascending thoracic aortic aneurysm.  He is agreeable.  Patient states that he cannot remember exactly what they told him regarding his lung cancer.  He states that this was done in [place].  He states he knows there was a spot in the left lung in the right lung.  D-dimer is positive.  We will plan to get a CTA.  CTA shows positive for single identifiable embolus on each side.  What is visible in the left lower lobe arterial tree involving the branches.  The other one is in the  right lobe pulmonary artery branches.  No massive or submassive embolic disease.  No evidence of right heart strain.  Patient has a PESI score of 78.  Which puts him in the low risk category.  Patient ambulated in the ED maintaining O2 sats of 98%.  I discussed with Dr. Darl Householder who personally evaluated patient.  At this time, patient is very well-appearing.  He does not have any chest pain or difficulty breathing.  He is hemodynamically stable with no signs of  tachycardia or hypoxia.  Will plan for case manager consult to ensure that he gets follow-up.  Patient started on the Xarelto starter pack.  I discussed with patient regarding the importance of follow-up.  Instructed patient that he will need to get further medication.  He is instructed to come back for any chest pain, difficulty breathing. Patient had ample opportunity for questions and discussion. All patient's questions were answered with full understanding. Strict return precautions discussed. Patient expresses understanding and agreement to plan.   Portions of this note were generated with Lobby[employer]. Dictation errors may occur despite best attempts at proofreading.  Final Clinical Impression(s) / ED Diagnoses Final diagnoses:  Back pain  Other acute pulmonary embolism, unspecified whether acute cor pulmonale present [facility])    Rx / DC Orders ED Discharge Orders         Ordered    Rivaroxaban 15 & 20 MG TBPK     05/22/19 2315    methocarbamol (ROBAXIN) 500 MG tablet  2 times daily     05/22/19 2325           Volanda Napoleon, PA-C 05/22/19 2355    Volanda Napoleon, PA-C 05/23/19 0018    Drenda Freeze, MD 05/26/19 [phone]

## 2019-05-22 NOTE — ED Triage Notes (Signed)
EMS reported Pt has Lt sided back pain that shoot up his body to his left arm.

## 2019-05-22 NOTE — ED Notes (Signed)
Patient verbalizes understanding of discharge instructions. Opportunity for questioning and answers were provided. Armband removed by staff, pt discharged from ED. Pt. ambulatory and discharged home.  

## 2019-05-22 NOTE — Discharge Instructions (Addendum)
As we discussed, we found pulmonary blood clots in your chest CTA today.  This needs treated for blood thinners.  I provided you with a prescription to initially start off but you will need another prescription.  As we discussed, case manager will help you arrange for a primary care appointment.  If you have not heard from them in a week, call the [facility] wellness clinic as you will need a refill of your medications.  Additionally, your back pain may be musculoskeletal in nature.  I provided you a muscle relaxer.  It is very important that you get follow-up with a primary care doctor.  I provided you a referral to [facility] wellness clinic.  Additionally, I reach out to her case manager who has your information will try and arrange you for an appointment.  If at any time, you have chest pain, difficulty breathing, you need to return to the emergency department immediately.

## 2019-05-22 NOTE — ED Triage Notes (Signed)
BP 16/11, HR 111, Resp 18 , SPO2 98 RA

## 2019-05-27 ENCOUNTER — Telehealth: Payer: Self-pay

## 2019-05-27 NOTE — Telephone Encounter (Signed)
Call placed to patient regarding ostomy supplies.  He said that he has the application for [facility] and need to complete it. In the meantime, he needs flanges.  Informed him that this clinic has some samples  63m and he thinks those will work.. informed him that he can come to the clinic to pick them up

## 2019-05-28 ENCOUNTER — Telehealth: Payer: Self-pay | Admitting: *Deleted

## 2019-05-28 NOTE — Telephone Encounter (Signed)
Patient stopped by the front office and picked up ostomy supplies from Care management coordinator.

## 2019-05-31 NOTE — Progress Notes (Signed)
Patient ID: Juan Hester, male   DOB: 1980-06-19, 39 y.o.   MRN: [id number]     Juan Hester, is a 39 y.o. male  TUU:[id number]  PHX:[id number]  DOB - [birth year]  Subjective:  Chief Complaint and HPI: Juan Hester is a 39 y.o. male here today  a follow up visit ED 05/22/2019 for PE.  He was started on xarelto.  However, he did not get the prescription due to cost.  He denies SOB, CP.  He continues to have back pain and the robaxin at 524m bid is not helping.  He requests soma.    [place]  He does not drink alcohol.  Denies abdominal pain except occasionally associated with Crohns dz.    From ED A/P: 39year old male who presents for evaluation of back pain that began yesterday.  No preceding trauma, injury, fall.  No fevers.  No numbness/weakness.  On initial ED arrival, he is afebrile, nontoxic-appearing.  Vital signs are stable.  He has midline tenderness noted to the lower lumbar region that extend the left paraspinal muscles.  He has no midline T-spine tenderness but he feels like the pain shoots up into his left side.  Reports some associated tingling in his left upper extremity.  He does have a history of colon cancer for which he received chemo and had a colostomy.  He did not get any brain radiation.  He states he was recently told that the cancer is is in his lungs nail but has not started treatment.  Given his extensive history, will plan for imaging to ensure that this is not metastasis that is causing his symptoms.  CBC shows slight leukocytosis of 11.0.  CMP shows alk phos of 161.  Slight elevation of his liver enzymes at AST's-47 and ALT-58.  CT head negative for intracranial abnormality. CT C spine negative for any acute normality.  CT chest/abd/pelvis with contrast noted to have a questionable acute PE involving a segmental branch of the left lower lobe. He also has a ascending thoracic aortic aneurysm currently  measures at 4 cm. He has known colostomy in place. He has also a 3 mm pulmonary nodule noted in the left lower lobe. No acute findings for T or L spine tenderness noted.  I discussed this finding with the radiologist.  The radiologist recommended obtaining a D-dimer.  If the D-dimer is negative, no further intervention needed but of his was positive, he would need a CTA to ensure that this is not a blood clot.  I discussed this with patient.  I discussed with him regarding all the results and follow-up.  He knew about the pulmonary nodules and stated that he had been told them before.  He did not know about the ascending thoracic aortic aneurysm.  He is agreeable.  Patient states that he cannot remember exactly what they told him regarding his lung cancer.  [place]  He states he knows there was a spot in the left lung in the right lung.  D-dimer is positive.  We will plan to get a CTA.  CTA shows positive for single identifiable embolus on each side.  What is visible in the left lower lobe arterial tree involving the branches.  The other one is in the right lobe pulmonary artery branches.  No massive or submassive embolic disease.  No evidence of right heart strain.  Patient has a PESI score of 78.  Which puts him in the low risk category.  Patient ambulated in the ED maintaining O2 sats of 98%.  I discussed with Dr. Darl Householder who personally evaluated patient.  At this time, patient is very well-appearing.  He does not have any chest pain or difficulty breathing.  He is hemodynamically stable with no signs of tachycardia or hypoxia.  Will plan for case manager consult to ensure that he gets follow-up.  Patient started on the Xarelto starter pack.  I discussed with patient regarding the importance of follow-up.  Instructed patient that he will need to get further medication.  He is instructed to come back for any chest pain, difficulty breathing. Patient had ample  opportunity for questions and discussion. All patient's questions were answered with full understanding. Strict return precautions discussed. Patient expresses understanding and agreement to plan.    ED/Hospital notes reviewed.   Social History:  Moved here 2 months ago from [place].  Not working  ROS:   Constitutional:  No f/c, No night sweats, No unexplained weight loss. EENT:  No vision changes, No blurry vision, No hearing changes. No mouth, throat, or ear problems.  Respiratory: No cough, No SOB Cardiac: No CP, no palpitations GI:  No abd pain, No N/V/D. GU: No Urinary s/sx Musculoskeletal: +back pain Neuro: No headache, no dizziness, no motor weakness.  Skin: No rash Endocrine:  No polydipsia. No polyuria.  Psych: Denies SI/HI  No problems updated.  ALLERGIES: Allergies  Allergen Reactions  . Azathioprine Other (See Comments)    Pt does not recall reaction   . Ciprofloxacin Other (See Comments)    Pt does not recall reaction   . Metronidazole Other (See Comments)    Pt does not recall reaction   . Sulfa Antibiotics Other (See Comments)    Pt does not recall reaction     PAST MEDICAL HISTORY: Past Medical History:  Diagnosis Date  . Crohn disease ([place])   . Thyroid disease     MEDICATIONS AT HOME: Prior to Admission medications   Medication Sig Start Date End Date Taking? Authorizing Provider  benztropine (COGENTIN) 2 MG tablet Take 2 mg by mouth daily.   Yes [provider]  hydrochlorothiazide (HYDRODIURIL) 25 MG tablet Take 25 mg by mouth daily.   Yes [provider]  loperamide (IMODIUM) 2 MG capsule Take 2 mg by mouth as needed for diarrhea or loose stools.   Yes [provider]  methocarbamol (ROBAXIN) 500 MG tablet Take 2 tablets (1,000 mg total) by mouth every 6 (six) hours as needed for muscle spasms. 06/02/19  Yes Marijayne Rauth M, PA-C  OLANZapine (ZYPREXA) 20 MG tablet Take 20 mg by mouth at bedtime.   Yes [provider]  SIMETHICONE PO Take 1 tablet by mouth as needed (gas).   Yes [provider]  acetaminophen (TYLENOL) 500 MG tablet Take 1,000 mg by mouth every 6 (six) hours as needed for mild pain, moderate pain or headache.    [provider]  ARIPIPRAZOLE PO Take 1 tablet by mouth daily.    [provider]  DULoxetine HCl (CYMBALTA PO) Take 1 capsule by mouth daily.    [provider]  inFLIXimab (REMICADE) 100 MG injection Inject 100 mg into the vein every 6 (six) weeks.    [provider]  naproxen (NAPROSYN) 500 MG tablet Take 1 tablet (500 mg total) by mouth 2 (two) times daily. Patient not taking: Reported on 06/02/2019 04/18/19   Jaynee Eagles, PA-C  rivaroxaban (XARELTO) 20 MG TABS tablet Take 1 tablet (20 mg total) by mouth daily with supper. After completing starter pack 06/02/19   Argentina Donovan, PA-C  Rivaroxaban 15 & 20 MG TBPK Follow package directions: Take one 62m tablet by mouth twice a day. On day 22, switch to one 231mtablet once a day. Take with food. 06/02/19   McArgentina DonovanPA-C     Objective:  EXAM:   Vitals:   06/02/19 1026  BP: 122/81  Pulse: 90  Resp: 17  Temp: 98 F (36.7 C)  SpO2: 97%  Weight: 262 lb (118.8 kg)  Height: 6' 1"  (1.854 m)    General appearance : A&OX3. NAD. Non-toxic-appearing HEENT: Atraumatic and Normocephalic.  PERRLA. EOM intact.  Neck: supple, no JVD. No cervical lymphadenopathy. No thyromegaly Chest/Lungs:  Breathing-non-labored, Good air entry bilaterally, breath sounds normal without rales, rhonchi, or wheezing  CVS: S1 S2 regular, no murmurs, gallops, rubs  Extremities: Bilateral Lower Ext shows no edema, both legs are warm to touch with = pulse throughout Back:  Full S&ROM.  Seems pain free with distraction.  There is paraspinus spasm in the thoracolumbar region.  Neg SLR B.   Neurology:  CN II-XII grossly intact, Non focal.   Psych:  TP linear. J/I WNL. Normal speech. Appropriate  eye contact and affect.  Skin:  No Rash  Data Review Lab Results  Component Value Date   HGBA1C 5.0 05/05/2019     Assessment & Plan   1. Multiple subsegmental pulmonary emboli without acute cor pulmonale (HCC) Must start xarelto!!!!  He will be able to get this for $10 or less here.   - rivaroxaban (XARELTO) 20 MG TABS tablet; Take 1 tablet (20 mg total) by mouth daily with supper. After completing starter pack  Dispense: 30 tablet; Refill: 4 - Rivaroxaban 15 & 20 MG TBPK; Follow package directions: Take one 1584mablet by mouth twice a day. On day 22, switch to one 26m52mblet once a day. Take with food.  Dispense: 51 each; Refill: 0  2. Muscle spasm Will increase dose - methocarbamol (ROBAXIN) 500 MG tablet; Take 2 tablets (1,000 mg total) by mouth every 6 (six) hours as needed for muscle spasms.  Dispense: 90 tablet; Refill: 0  3. [facility]'s disease with complication, [facility] tract [facility] once financial aid/medicaid is set up(in process)  4. Depression, unspecified depression type Continue current regimen-will also need referral for this - Ambulatory referral to Social Work  5. Hospital discharge follow-up No changes   Patient have been counseled extensively about nutrition and exercise  Return in about 1 month (around 06/30/2019) for assign PCP.  The patient was given clear instructions to go to ER or return to medical center if symptoms don't improve, worsen or new problems develop. The patient verbalized understanding. The patient was told to call to get lab results if they haven't heard anything in the next week.     AngeFreeman Caldron-C [facility] [place]/17/2021, 10:58 AM

## 2019-06-02 ENCOUNTER — Telehealth: Payer: Self-pay

## 2019-06-02 ENCOUNTER — Ambulatory Visit (HOSPITAL_BASED_OUTPATIENT_CLINIC_OR_DEPARTMENT_OTHER): Payer: Self-pay | Admitting: Licensed Clinical Social Worker

## 2019-06-02 ENCOUNTER — Other Ambulatory Visit: Payer: Self-pay

## 2019-06-02 ENCOUNTER — Ambulatory Visit: Payer: Self-pay | Attending: Family Medicine | Admitting: Physician Assistant

## 2019-06-02 VITALS — BP 122/81 | HR 90 | Temp 98.0°F | Resp 17 | Ht 73.0 in | Wt 262.0 lb

## 2019-06-02 DIAGNOSIS — F32A Depression, unspecified: Secondary | ICD-10-CM

## 2019-06-02 DIAGNOSIS — F411 Generalized anxiety disorder: Secondary | ICD-10-CM

## 2019-06-02 DIAGNOSIS — K50919 Crohn's disease, unspecified, with unspecified complications: Secondary | ICD-10-CM

## 2019-06-02 DIAGNOSIS — F331 Major depressive disorder, recurrent, moderate: Secondary | ICD-10-CM

## 2019-06-02 DIAGNOSIS — I2694 Multiple subsegmental pulmonary emboli without acute cor pulmonale: Secondary | ICD-10-CM

## 2019-06-02 DIAGNOSIS — Z09 Encounter for follow-up examination after completed treatment for conditions other than malignant neoplasm: Secondary | ICD-10-CM

## 2019-06-02 DIAGNOSIS — M62838 Other muscle spasm: Secondary | ICD-10-CM

## 2019-06-02 DIAGNOSIS — F329 Major depressive disorder, single episode, unspecified: Secondary | ICD-10-CM

## 2019-06-02 MED ORDER — RIVAROXABAN (XARELTO) VTE STARTER PACK (15 & 20 MG): ORAL_TABLET | ORAL | 0 refills | Status: DC

## 2019-06-02 MED ORDER — METHOCARBAMOL 500 MG PO TABS: 1000.0000 mg | ORAL_TABLET | Freq: Four times a day (QID) | ORAL | 0 refills | Status: DC | PRN

## 2019-06-02 MED ORDER — RIVAROXABAN 20 MG PO TABS: 20.0000 mg | ORAL_TABLET | Freq: Every day | ORAL | 4 refills | Status: DC

## 2019-06-02 MED FILL — METHOCARBAMOL 500 MG TABS: 500 | 11 days supply | Qty: 90 | Fill #0

## 2019-06-02 MED FILL — XARELTO 15 MG TABLET: 15 | 21 days supply | Qty: 42 | Fill #0

## 2019-06-02 NOTE — Telephone Encounter (Signed)
Met with the patient when he was in the clinic today.  He said that the flanges that he picked up last week are slightly small but will suffice.  He has submitted the application to [facility] for uninsured assistance with ostomy supplies. He has also applied for medicaid. Explained to him that if he is denied a referral can be made to [employer] [place] to guide him through the appeal process. He will need to let this CM or LCSW know about DSS final  decision regarding the application.

## 2019-06-02 NOTE — BH Specialist Note (Signed)
Integrated Behavioral Health Initial Visit  MRN: [id number] Name: Juan Hester  Number of [place] Clinician visits:: 1/6 Session Start time: 11:00 AM  Session End time: 11:20 AM Total time: 20  Type of Service: [place] Interpretor:No. Interpretor Name and Language: NA   Warm Hand Off Completed.       SUBJECTIVE: Juan Hester is a 39 y.o. male accompanied by self Patient was referred by Weyman Pedro for resources and anxiety. Patient reports the following symptoms/concerns: Pt reports difficulty managing anxiety. He returned to [place] approximately two months ago after serving a two year sentence out of state. Pt reports panic attacks, racing thoughts, and difficulty sleeping (2-3 hrs daily) Duration of problem: Ongoing, Pt was diagnosed with Anxiety and Depression in 2006; Severity of problem: moderate  OBJECTIVE: Mood: Anxious and Pleasant and Affect: Appropriate Risk of harm to self or others: No plan to harm self or others Pt has hx of auditory and visual hallucinations; however, symptoms have resolved since taking zyprexa  LIFE CONTEXT: Family and Social: Pt resides with mother School/Work: Pt has applied for [employer] and disability Self-Care: Pt is open to initiating psychiatry to assist in the management of mental health Life Changes: Pt has difficulty managing chronic physical and mental health conditions  GOALS ADDRESSED: Patient will: 1. Reduce symptoms of: anxiety and depression 2. Increase knowledge and/or ability of: coping skills and healthy habits  3. Demonstrate ability to: Increase healthy adjustment to current life circumstances and Increase adequate support systems for patient/family  INTERVENTIONS: Interventions utilized: Solution-Focused Strategies, Supportive Counseling, Psychoeducation and/or Health Education and Link to [employer]  Standardized Assessments completed: GAD-7 and PHQ  2&9  ASSESSMENT: Patient currently experiencing depression and anxiety triggered by psychosocial stressors. Pt has hx of auditory and visual hallucinations; however, symptoms have resolved since taking zyprexa. Denies SI/HI   Patient may benefit from psychiatry and therapy. Healthy coping skills were discussed and identified. Pt's parole officer has completed a referral to [facility] and assessment is scheduled for 06/03/19. Pt was encouraged to contact LCSW if a referral for behavioral health services is needed after his assessment. Pt verbalized understanding.  PLAN: 1. Follow up with behavioral health clinician on : Contact LCSW with behavioral health and/or resource needs 2. Behavioral recommendations: Utilize healthy coping skills and follow up with [facility] assessment for behavioral health services 3. Referral(s): [place] (In Clinic) 4. "From scale of 1-10, how likely are you to follow plan?":   Rebekah Chesterfield, LCSW 06/11/19 3:27 PM

## 2019-06-02 NOTE — Patient Instructions (Signed)
START YOUR XARELTO  Pulmonary Embolism  A pulmonary embolism (PE) is a sudden blockage or decrease of blood flow in one or both lungs. Most blockages come from a blood clot that forms in the vein of a lower leg, thigh, or arm (deep vein thrombosis, DVT) and travels to the lungs. A clot is blood that has thickened into a gel or solid. PE is a dangerous and life-threatening condition that needs to be treated right away. What are the causes? This condition is usually caused by a blood clot that forms in a vein and moves to the lungs. In rare cases, it may be caused by air, fat, part of a tumor, or other tissue that moves through the veins and into the lungs. What increases the risk? The following factors may make you more likely to develop this condition:  Experiencing a traumatic injury, such as breaking a hip or leg.  Having: ? A spinal cord injury. ? Orthopedic surgery, especially hip or knee replacement. ? Any major surgery. ? A stroke. ? DVT. ? Blood clots or blood clotting disease. ? Long-term (chronic) lung or heart disease. ? Cancer treated with chemotherapy. ? A central venous catheter.  Taking medicines that contain estrogen. These include birth control pills and hormone replacement therapy.  Being: ? Pregnant. ? In the period of time after your baby is delivered (postpartum). ? Older than age 90. ? Overweight. ? A smoker, especially if you have other risks. What are the signs or symptoms? Symptoms of this condition usually start suddenly and include:  Shortness of breath during activity or at rest.  Coughing, coughing up blood, or coughing up blood-tinged mucus.  Chest pain that is often worse with deep breaths.  Rapid or irregular heartbeat.  Feeling light-headed or dizzy.  Fainting.  Feeling anxious.  Fever.  Sweating.  Pain and swelling in a leg. This is a symptom of DVT, which can lead to PE. How is this diagnosed? This condition may be diagnosed  based on:  Your medical history.  A physical exam.  Blood tests.  CT pulmonary angiogram. This test checks blood flow in and around your lungs.  Ventilation-perfusion scan, also called a lung VQ scan. This test measures air flow and blood flow to the lungs.  An ultrasound of the legs. How is this treated? Treatment for this condition depends on many factors, such as the cause of your PE, your risk for bleeding or developing more clots, and other medical conditions you have. Treatment aims to remove, dissolve, or stop blood clots from forming or growing larger. Treatment may include:  Medicines, such as: ? Blood thinning medicines (anticoagulants) to stop clots from forming. ? Medicines that dissolve clots (thrombolytics).  Procedures, such as: ? Using a flexible tube to remove a blood clot (embolectomy) or to deliver medicine to destroy it (catheter-directed thrombolysis). ? Inserting a filter into a large vein that carries blood to the heart (inferior vena cava). This filter (vena cava filter) catches blood clots before they reach the lungs. ? Surgery to remove the clot (surgical embolectomy). This is rare. You may need a combination of immediate, long-term (up to 3 months after diagnosis), and extended (more than 3 months after diagnosis) treatments. Your treatment may continue for several months (maintenance therapy). You and your health care provider will work together to choose the treatment program that is best for you. Follow these instructions at home: Medicines  Take over-the-counter and prescription medicines only as told by your health care  provider.  If you are taking an anticoagulant medicine: ? Take the medicine every day at the same time each day. ? Understand what foods and drugs interact with your medicine. ? Understand the side effects of this medicine, including excessive bruising or bleeding. Ask your health care provider or pharmacist about other side  effects. General instructions  Wear a medical alert bracelet or carry a medical alert card that says you have had a PE and lists what medicines you take.  Ask your health care provider when you may return to your normal activities. Avoid sitting or lying for a long time without moving.  Maintain a healthy weight. Ask your health care provider what weight is healthy for you.  Do not use any products that contain nicotine or tobacco, such as cigarettes, e-cigarettes, and chewing tobacco. If you need help quitting, ask your health care provider.  Talk with your health care provider about any travel plans. It is important to make sure that you are still able to take your medicine while on trips.  Keep all follow-up visits as told by your health care provider. This is important. Contact a health care provider if:  You missed a dose of your blood thinner medicine. Get help right away if:  You have: ? New or increased pain, swelling, warmth, or redness in an arm or leg. ? Numbness or tingling in an arm or leg. ? Shortness of breath during activity or at rest. ? A fever. ? Chest pain. ? A rapid or irregular heartbeat. ? A severe headache. ? Vision changes. ? A serious fall or accident, or you hit your head. ? Stomach (abdominal) pain. ? Blood in your vomit, stool, or urine. ? A cut that will not stop bleeding.  You cough up blood.  You feel light-headed or dizzy.  You cannot move your arms or legs.  You are confused or have memory loss. These symptoms may represent a serious problem that is an emergency. Do not wait to see if the symptoms will go away. Get medical help right away. Call your local emergency services (911 in the [place] Do not drive yourself to the hospital. Summary  A pulmonary embolism (PE) is a sudden blockage or decrease of blood flow in one or both lungs. PE is a dangerous and life-threatening condition that needs to be treated right away.  Treatments for this  condition usually include medicines to thin your blood (anticoagulants) or medicines to break apart blood clots (thrombolytics).  If you are given blood thinners, it is important to take the medicine every day at the same time each day.  Understand what foods and drugs interact with any medicines that you are taking.  If you have signs of PE or DVT, call your local emergency services (911 in the [place] This information is not intended to replace advice given to you by your health care provider. Make sure you discuss any questions you have with your health care provider. Document Revised: 01/07/2018 Document Reviewed: 01/07/2018 Elsevier Patient Education  2020 [employer].

## 2019-06-02 NOTE — Progress Notes (Signed)
Hospital f /u   Pt stated currently taking Zyprexa/ updated med in the chart

## 2019-06-03 DIAGNOSIS — F17210 Nicotine dependence, cigarettes, uncomplicated: Secondary | ICD-10-CM | POA: Insufficient documentation

## 2019-06-03 DIAGNOSIS — F4310 Post-traumatic stress disorder, unspecified: Secondary | ICD-10-CM | POA: Insufficient documentation

## 2019-06-03 DIAGNOSIS — F250 Schizoaffective disorder, bipolar type: Secondary | ICD-10-CM | POA: Insufficient documentation

## 2019-06-06 ENCOUNTER — Emergency Department (HOSPITAL_COMMUNITY)
Admission: EM | Admit: 2019-06-06 | Discharge: 2019-06-06 | Disposition: A | Discharge status: Home / Self Care | Payer: Self-pay | Attending: Emergency Medicine | Admitting: Emergency Medicine

## 2019-06-06 ENCOUNTER — Encounter (HOSPITAL_COMMUNITY): Payer: Self-pay | Admitting: Emergency Medicine

## 2019-06-06 ENCOUNTER — Emergency Department (HOSPITAL_COMMUNITY): Payer: Self-pay

## 2019-06-06 DIAGNOSIS — F17200 Nicotine dependence, unspecified, uncomplicated: Secondary | ICD-10-CM | POA: Insufficient documentation

## 2019-06-06 DIAGNOSIS — Z85038 Personal history of other malignant neoplasm of large intestine: Secondary | ICD-10-CM | POA: Insufficient documentation

## 2019-06-06 DIAGNOSIS — I2699 Other pulmonary embolism without acute cor pulmonale: Secondary | ICD-10-CM | POA: Insufficient documentation

## 2019-06-06 DIAGNOSIS — Z7901 Long term (current) use of anticoagulants: Secondary | ICD-10-CM | POA: Insufficient documentation

## 2019-06-06 DIAGNOSIS — Z79899 Other long term (current) drug therapy: Secondary | ICD-10-CM | POA: Insufficient documentation

## 2019-06-06 HISTORY — DX: Other pulmonary embolism without acute cor pulmonale: I26.99

## 2019-06-06 LAB — BASIC METABOLIC PANEL
Anion gap: 11 (ref 5–15)
BUN: 12 mg/dL (ref 6–20)
CO2: 23 mmol/L (ref 22–32)
Calcium: 8.8 mg/dL — ABNORMAL LOW (ref 8.9–10.3)
Chloride: 106 mmol/L (ref 98–111)
Creatinine, Ser: 1.29 mg/dL — ABNORMAL HIGH (ref 0.61–1.24)
GFR calc Af Amer: >60 mL/min (ref >60)
GFR calc non Af Amer: >60 mL/min (ref >60)
Glucose, Bld: 102 mg/dL — ABNORMAL HIGH (ref 70–99)
Potassium: 3.4 mmol/L — ABNORMAL LOW (ref 3.5–5.1)
Sodium: 140 mmol/L (ref 135–145)

## 2019-06-06 LAB — CBC
HCT: 45.5 % (ref 39.0–52.0)
Hemoglobin: 15.6 g/dL (ref 13.0–17.0)
MCH: 30.2 pg (ref 26.0–34.0)
MCHC: 34.3 g/dL (ref 30.0–36.0)
MCV: 88.2 fL (ref 80.0–100.0)
Platelets: 194 10*3/uL (ref 150–400)
RBC: 5.16 MIL/uL (ref 4.22–5.81)
RDW: 14.3 % (ref 11.5–15.5)
WBC: 12.1 10*3/uL — ABNORMAL HIGH (ref 4.0–10.5)
nRBC: 0 % (ref 0.0–0.2)

## 2019-06-06 LAB — PROTIME-INR
INR: 1 (ref 0.8–1.2)
Prothrombin Time: 12.9 seconds (ref 11.4–15.2)

## 2019-06-06 LAB — TROPONIN I (HIGH SENSITIVITY): Troponin I (High Sensitivity): <2 ng/L (ref <18)

## 2019-06-06 MED ORDER — RIVAROXABAN 15 MG PO TABS
15.0000 mg | ORAL_TABLET | Freq: Once | ORAL | Status: AC
  Administered 2019-06-06: 15 mg via ORAL
  Filled 2019-06-06: qty 1

## 2019-06-06 MED ORDER — SODIUM CHLORIDE 0.9% FLUSH
3.0000 mL | Freq: Once | INTRAVENOUS | Status: AC
  Administered 2019-06-06: 22:00:00 3 mL via INTRAVENOUS

## 2019-06-06 NOTE — ED Provider Notes (Addendum)
[facility] EMERGENCY DEPARTMENT Provider Note   CSN: [id number] Arrival date & time: 06/06/19  2039     History Chief Complaint  Patient presents with  . Shortness of Breath    Juan Hester is a 39 y.o. male.  Patient with history of colon cancer, no current treatment, patient did have chemo and surgery, recent diagnosis of pulmonary emboli in the emergency room however patient unable to fill prescription due to financial reasons.  Initially the cost was substantial from 1 pharmacy greater than $500 however patient did go to [facility] wellness and they managed to work with social worker to get for $10 however patient said he still cannot afford it.  Patient's had persistent shortness of breath since that time.        Past Medical History:  Diagnosis Date  . Crohn disease ([place])   . PE (pulmonary thromboembolism) ([place])   . Thyroid disease     Patient Active Problem List   Diagnosis Date Noted  . Crohn's disease with complication ([place]) 50/93/2671  . Colostomy, evaluate ([place]) 05/05/2019    Past Surgical History:  Procedure Laterality Date  . ABDOMINAL SURGERY    . COLON SURGERY    . SMALL INTESTINE SURGERY         No family history on file.  Social History   Tobacco Use  . Smoking status: Current Every Day Smoker    Packs/day: 1.00  . Smokeless tobacco: Never Used  Substance Use Topics  . Alcohol use: Yes  . Drug use: Not Currently    Home Medications Prior to Admission medications   Medication Sig Start Date End Date Taking? Authorizing Provider  acetaminophen (TYLENOL) 500 MG tablet Take 1,000 mg by mouth every 6 (six) hours as needed for mild pain, moderate pain or headache.   Yes [provider]  benztropine (COGENTIN) 2 MG tablet Take 2 mg by mouth daily.   Yes [provider]  hydrochlorothiazide (HYDRODIURIL) 25 MG tablet Take 25 mg by mouth daily.   Yes [provider]  loperamide (IMODIUM) 2 MG  capsule Take 2 mg by mouth as needed for diarrhea or loose stools.   Yes [provider]  methocarbamol (ROBAXIN) 500 MG tablet Take 2 tablets (1,000 mg total) by mouth every 6 (six) hours as needed for muscle spasms. 06/02/19  Yes McClung, Angela M, PA-C  OLANZapine (ZYPREXA) 20 MG tablet Take 20 mg by mouth at bedtime.   Yes [provider]  simethicone (MYLICON) 80 MG chewable tablet Chew 80 mg by mouth every 6 (six) hours as needed for flatulence.   Yes [provider]  inFLIXimab (REMICADE) 100 MG injection Inject 100 mg into the vein every 6 (six) weeks.    [provider]  naproxen (NAPROSYN) 500 MG tablet Take 1 tablet (500 mg total) by mouth 2 (two) times daily. Patient not taking: Reported on 06/02/2019 04/18/19   Jaynee Eagles, PA-C  rivaroxaban (XARELTO) 20 MG TABS tablet Take 1 tablet (20 mg total) by mouth daily with supper. After completing starter pack 06/02/19   Argentina Donovan, PA-C  Rivaroxaban 15 & 20 MG TBPK Follow package directions: Take one 34m tablet by mouth twice a day. On day 22, switch to one 243mtablet once a day. Take with food. 06/02/19   McArgentina DonovanPA-C    Allergies    Azathioprine, Ciprofloxacin, Metronidazole, and Sulfa antibiotics  Review of Systems   Review of Systems  Constitutional:  Negative for chills and fever.  HENT: Negative for congestion.   Eyes: Negative for visual disturbance.  Respiratory: Positive for shortness of breath.   Cardiovascular: Negative for chest pain.  Gastrointestinal: Negative for abdominal pain and vomiting.  Genitourinary: Negative for dysuria and flank pain.  Musculoskeletal: Positive for back pain. Negative for neck pain and neck stiffness.  Skin: Negative for rash.  Neurological: Negative for light-headedness and headaches.    Physical Exam Updated Vital Signs BP (!) 125/96   Pulse 92   Temp 98.4 F (36.9 C) (Oral)   Resp 15   Ht 6' 1"  (1.854 m)   Wt 118 kg   SpO2 97%    BMI 34.32 kg/m   Physical Exam Vitals and nursing note reviewed.  Constitutional:      Appearance: He is well-developed.  HENT:     Head: Normocephalic and atraumatic.  Eyes:     General:        Right eye: No discharge.        Left eye: No discharge.     Conjunctiva/sclera: Conjunctivae normal.  Neck:     Trachea: No tracheal deviation.  Cardiovascular:     Rate and Rhythm: Normal rate and regular rhythm.  Pulmonary:     Effort: Pulmonary effort is normal.     Breath sounds: Normal breath sounds.  Abdominal:     General: There is no distension.     Palpations: Abdomen is soft.     Tenderness: There is no abdominal tenderness. There is no guarding.  Musculoskeletal:        General: Normal range of motion.     Cervical back: Normal range of motion and neck supple.     Right lower leg: No edema.     Left lower leg: No edema.  Skin:    General: Skin is warm.     Findings: No rash.  Neurological:     Mental Status: He is alert and oriented to person, place, and time.     ED Results / Procedures / Treatments   Labs (all labs ordered are listed, but only abnormal results are displayed) Labs Reviewed  BASIC METABOLIC PANEL - Abnormal; Notable for the following components:      Result Value   Potassium 3.4 (*)    Glucose, Bld 102 (*)    Creatinine, Ser 1.29 (*)    Calcium 8.8 (*)    All other components within normal limits  CBC - Abnormal; Notable for the following components:   WBC 12.1 (*)    All other components within normal limits  PROTIME-INR  TROPONIN I (HIGH SENSITIVITY)  TROPONIN I (HIGH SENSITIVITY)    EKG EKG Interpretation  Date/Time:  Sunday June 06 2019 20:44:42 EST Ventricular Rate:  106 PR Interval:  146 QRS Duration: 84 QT Interval:  342 QTC Calculation: 454 R Axis:   -20 Text Interpretation: Sinus tachycardia Otherwise normal ECG Confirmed by Elnora Morrison [phone]) on 06/06/2019 10:02:02 PM   Radiology DG Chest 2 View  Result Date:  06/06/2019 CLINICAL DATA:  39 year old male with chest pain. EXAM: CHEST - 2 VIEW COMPARISON:  Chest CT dated 05/22/2019. FINDINGS: Left pectoral Port-A-Cath with tip close to the cavoatrial junction. The lungs are clear. There is no pleural effusion or pneumothorax. The cardiac silhouette is within normal limits. No acute osseous pathology. IMPRESSION: No active cardiopulmonary disease. Electronically Signed   By: Anner Crete M.D.   On: 06/06/2019 21:08    Procedures Procedures (including critical  care time)  Medications Ordered in ED Medications  Rivaroxaban (XARELTO) tablet 15 mg (has no administration in time range)  sodium chloride flush (NS) 0.9 % injection 3 mL (3 mLs Intravenous Given 06/06/19 2145)    ED Course  I have reviewed the triage vital signs and the nursing notes.  Pertinent labs & imaging results that were available during my care of the patient were reviewed by me and considered in my medical decision making (see chart for details).    MDM Rules/Calculators/A&P                     Patient with colon cancer history presents for persistent shortness of breath and back pain since being diagnosed with pulmonary emboli.  Patient has not been able to fill his prescription and has had persistent symptoms.  Discussed the importance of filling prescription to help prevent worsening blood clots which may lead to death.  Patient currently lives with his mother.  Patient had blood work done on arrival overall unremarkable with normal hemoglobin, normal troponin.  Xarelto dose will be given in the emergency room.  We will send another message to social worker Mariann Laster to follow-up tomorrow morning. Pt has appt at 10 am tomorrow.   Patient stable for close outpatient follow-up to get prescription filled tomorrow. Final Clinical Impression(s) / ED Diagnoses Final diagnoses:  Pulmonary embolism and infarction [facility])    Rx / DC Orders ED Discharge Orders    None       Elnora Morrison, MD 06/06/19 2245    Elnora Morrison, MD 06/06/19 [phone]

## 2019-06-06 NOTE — ED Notes (Signed)
Pt taken for x ray.

## 2019-06-06 NOTE — Discharge Instructions (Addendum)
See [facility] and wellness for xarelto prescription. Do not take nsaids while on xarelto (ie ibuprofen, motrin, aleve, naproxen.Marland Kitchen) This is very important to thin your blood as you have blood clots that can end your life.

## 2019-06-06 NOTE — ED Triage Notes (Addendum)
Pt transported from home by [facility] for shob, back pain, pt dx with PE 1 week ago, pt unable to get medications d/t financial difficulty, IV est. Pt reports he still has not started Xarelto, pt states he does not have $10 to get meds. Pt states CP progressively worse since Friday.

## 2019-06-06 NOTE — ED Notes (Signed)
Patient verbalizes understanding of discharge instructions. Opportunity for questioning and answers were provided. Armband removed by staff, pt discharged from ED ambulatory to home.  

## 2019-06-07 LAB — TROPONIN I (HIGH SENSITIVITY): Troponin I (High Sensitivity): <2 ng/L (ref <18)

## 2019-06-10 ENCOUNTER — Telehealth: Payer: Self-pay

## 2019-06-10 NOTE — Telephone Encounter (Signed)
Message received from Laurena Slimmer, RN CM requesting assistance for patient with his xarelto as he did not have $10 for the medication.    Informed her that this CM  spoke to Kelli/ [facility] pharmacy tech who administers  the Patient Assistance Program. She said that the patient was eligible for a free prescription for the first fill. He did pick the xarelto up yesterday at no cost. He completed the application for financial assistance through the drug company when he was here. Vida Roller is processing the application now. If approved, he can receive xarelto free for 1 year. She said that may take a few weeks for approval.  She said he picked up the xarelto 15 mg which is to be taken for 3 weeks prior to increasing to 20 mg.  He would have to pay $10 for the 20 mg tablets if he is not approved by the drug company by then. He needs to apply for the [facility] Blue card and if approved it would be $10 but he could charge it.  They will not waive the $10.   Call placed to the patient and he confirmed that he has the xarelto and it was free of charge.  This CM explained to him that he needs to establish care at [facility]- he has an appt 07/01/2019 with Dr Chapman Fitch.   He also needs to apply for the [place] which will provide assistance with medication costs.  This is done with the [facility] Prior to meeting with the financial counselor, he needs to have a picture ID , a notarized letter of support as he has no income (this letter needs to include the name of the person providing support and how they are assisting), a copy of a utility bill with the current address, a copy of his food stamps award letter as well as a non -filing letter from the IRS as he has no income.  He stated that the did file taxes in 04/2019 for 2020 , even though he had no income, because he wanted to receive a stimulus check. Informed him that this CM will need check with the financial counselor regarding how to proceed with a non-filing  letter if he has filed for no income.

## 2019-06-17 MED FILL — !CYMBALTA 60MG CAPSULE: 60 | 30 days supply | Qty: 30 | Fill #0

## 2019-06-17 MED FILL — BENZTROPINE MES 2 MG TABLET: 2 | 30 days supply | Qty: 30 | Fill #0

## 2019-06-17 MED FILL — OLANZapine 20 MG TABS: 20 | 30 days supply | Qty: 30 | Fill #0

## 2019-06-17 MED FILL — DIVALPROEX SOD DR 500 MG TA: 500 | 30 days supply | Qty: 60 | Fill #0

## 2019-06-23 ENCOUNTER — Telehealth: Payer: Self-pay

## 2019-06-23 NOTE — Telephone Encounter (Signed)
Call placed to the patient and reminded him of the need to complete the [facility] Card application in order to obtain financial assistance with obtaining his medications at [facility]. Informed him that as per [facility] financial counselor he will need to have a copy of the tax return that he filed.  When he has all information collected he can call the financial counselor and schedule an appointment to meet to complete the [facility] Card application.  He stated that he understood.

## 2019-07-01 ENCOUNTER — Ambulatory Visit: Payer: Self-pay

## 2019-07-08 ENCOUNTER — Telehealth: Payer: Self-pay

## 2019-07-08 MED FILL — OLANZapine 20 MG TABS: 20 | 30 days supply | Qty: 30 | Fill #0

## 2019-07-08 NOTE — Telephone Encounter (Signed)
Call placed to reschedule appointment with Dr Chapman Fitch.   He has paperwork that needs to be completed for disability application.  He said that he will need time to make arrangements for transportation.   Appointment scheduled with Dr Chapman Fitch - 07/21/2019 @ 1050

## 2019-07-11 ENCOUNTER — Emergency Department (HOSPITAL_COMMUNITY): Payer: Self-pay

## 2019-07-11 ENCOUNTER — Other Ambulatory Visit: Payer: Self-pay

## 2019-07-11 ENCOUNTER — Emergency Department (HOSPITAL_COMMUNITY)
Admission: EM | Admit: 2019-07-11 | Discharge: 2019-07-11 | Disposition: A | Discharge status: Home / Self Care | Payer: Self-pay | Attending: Emergency Medicine | Admitting: Emergency Medicine

## 2019-07-11 ENCOUNTER — Encounter (HOSPITAL_COMMUNITY): Payer: Self-pay | Admitting: Emergency Medicine

## 2019-07-11 DIAGNOSIS — R071 Chest pain on breathing: Secondary | ICD-10-CM

## 2019-07-11 DIAGNOSIS — R0789 Other chest pain: Secondary | ICD-10-CM | POA: Insufficient documentation

## 2019-07-11 DIAGNOSIS — F17210 Nicotine dependence, cigarettes, uncomplicated: Secondary | ICD-10-CM | POA: Insufficient documentation

## 2019-07-11 DIAGNOSIS — Z79899 Other long term (current) drug therapy: Secondary | ICD-10-CM | POA: Insufficient documentation

## 2019-07-11 DIAGNOSIS — Z7901 Long term (current) use of anticoagulants: Secondary | ICD-10-CM | POA: Insufficient documentation

## 2019-07-11 DIAGNOSIS — R0602 Shortness of breath: Secondary | ICD-10-CM | POA: Insufficient documentation

## 2019-07-11 DIAGNOSIS — Z20822 Contact with and (suspected) exposure to covid-19: Secondary | ICD-10-CM | POA: Insufficient documentation

## 2019-07-11 LAB — BASIC METABOLIC PANEL
Anion gap: 13 (ref 5–15)
BUN: 13 mg/dL (ref 6–20)
CO2: 24 mmol/L (ref 22–32)
Calcium: 8.4 mg/dL — ABNORMAL LOW (ref 8.9–10.3)
Chloride: 102 mmol/L (ref 98–111)
Creatinine, Ser: 1.18 mg/dL (ref 0.61–1.24)
GFR calc Af Amer: >60 mL/min (ref >60)
GFR calc non Af Amer: >60 mL/min (ref >60)
Glucose, Bld: 122 mg/dL — ABNORMAL HIGH (ref 70–99)
Potassium: 3.6 mmol/L (ref 3.5–5.1)
Sodium: 139 mmol/L (ref 135–145)

## 2019-07-11 LAB — CBC
HCT: 41.8 % (ref 39.0–52.0)
Hemoglobin: 14.5 g/dL (ref 13.0–17.0)
MCH: 30.6 pg (ref 26.0–34.0)
MCHC: 34.7 g/dL (ref 30.0–36.0)
MCV: 88.2 fL (ref 80.0–100.0)
Platelets: 116 10*3/uL — ABNORMAL LOW (ref 150–400)
RBC: 4.74 MIL/uL (ref 4.22–5.81)
RDW: 13.2 % (ref 11.5–15.5)
WBC: 8.3 10*3/uL (ref 4.0–10.5)
nRBC: 0 % (ref 0.0–0.2)

## 2019-07-11 LAB — TROPONIN I (HIGH SENSITIVITY)
Troponin I (High Sensitivity): <2 ng/L (ref <18)
Troponin I (High Sensitivity): 3 ng/L (ref <18)

## 2019-07-11 LAB — POC SARS CORONAVIRUS 2 AG -  ED: SARS Coronavirus 2 Ag: NEGATIVE

## 2019-07-11 LAB — CBG MONITORING, ED: Glucose-Capillary: 116 mg/dL — ABNORMAL HIGH (ref 70–99)

## 2019-07-11 MED ORDER — ONDANSETRON HCL 4 MG/2ML IJ SOLN
4.0000 mg | Freq: Once | INTRAMUSCULAR | Status: AC
  Administered 2019-07-11: 4 mg via INTRAVENOUS
  Filled 2019-07-11: qty 2

## 2019-07-11 MED ORDER — MORPHINE SULFATE (PF) 4 MG/ML IV SOLN
4.0000 mg | Freq: Once | INTRAVENOUS | Status: AC
  Administered 2019-07-11: 4 mg via INTRAVENOUS
  Filled 2019-07-11: qty 1

## 2019-07-11 MED ORDER — IOHEXOL 350 MG/ML SOLN
75.0000 mL | Freq: Once | INTRAVENOUS | Status: AC | PRN
  Administered 2019-07-11: 75 mL via INTRAVENOUS

## 2019-07-11 NOTE — Discharge Instructions (Addendum)
Make sure to take your Xarelto medication.  You will need to continue to take 1 tablet daily.  Your doctor did order refills for that medication.  You can take tylenol as needed for pain

## 2019-07-11 NOTE — ED Triage Notes (Addendum)
-  PT presents by [facility] with reports of chest pain that started several weeks ago; increasingly worse in the last few days -Central chest pain radiates to back, rated 6/10 -also says he has nausea, denies vomiting  -He also reports shortness of breath with exertion. Says he was recently diagnosed with PE and takes Eliquis

## 2019-07-11 NOTE — ED Provider Notes (Signed)
[facility] EMERGENCY DEPARTMENT Provider Note   CSN: [id number] Arrival date & time: 07/11/19  0092     History Chief Complaint  Patient presents with  . Chest Pain  . Shortness of Breath    Juan Hester is a 39 y.o. male.  The history is provided by the patient.  Chest Pain Pain location:  Substernal area Pain quality: sharp   Pain radiates to:  Upper back Pain severity:  Moderate Onset quality:  Gradual Duration:  3 weeks Timing:  Constant Progression:  Worsening Context: breathing   Relieved by:  Nothing Worsened by:  Nothing Associated symptoms: shortness of breath   Shortness of Breath Associated symptoms: chest pain   Patient states he started having chest pain several weeks ago.  He was diagnosed with a pulmonary embolism on February 6.  He ended up having a CT scan that showed a single-vessel pulmonary embolism.  Patient was discharged home on oral anticoagulation.  Patient ended up returning back to the ED on February 21 because he was having financial issues affording the Xarelto.  Case management was involved and they ended up assisting him in getting his medications.  He was also given follow-up with [facility].  Patient presents to the ED because he now feels like he is having difficulty with his breathing.  This has progressed since his diagnosis.  He denies any fevers or chills.  No known Covid exposure.  He has noted some leg swelling.  Patient has been taking his anticoagulant medication     Past Medical History:  Diagnosis Date  . Crohn disease ([place])   . PE (pulmonary thromboembolism) ([place])   . Thyroid disease     Patient Active Problem List   Diagnosis Date Noted  . Crohn's disease with complication ([place]) 33/00/7622  . Colostomy, evaluate ([place]) 05/05/2019    Past Surgical History:  Procedure Laterality Date  . ABDOMINAL SURGERY    . COLON SURGERY    . SMALL INTESTINE SURGERY         No family history on  file.  Social History   Tobacco Use  . Smoking status: Current Every Day Smoker    Packs/day: 1.00  . Smokeless tobacco: Never Used  Substance Use Topics  . Alcohol use: Yes  . Drug use: Not Currently    Home Medications Prior to Admission medications   Medication Sig Start Date End Date Taking? Authorizing Provider  acetaminophen (TYLENOL) 500 MG tablet Take 1,000 mg by mouth every 6 (six) hours as needed for mild pain, moderate pain or headache.   Yes [provider]  divalproex (DEPAKOTE) 500 MG DR tablet Take 500 mg by mouth 2 (two) times daily. 06/17/19  Yes [provider]  DULoxetine (CYMBALTA) 60 MG capsule Take 60 mg by mouth every morning. 07/08/19  Yes [provider]  hydrochlorothiazide (HYDRODIURIL) 25 MG tablet Take 25 mg by mouth daily.   Yes [provider]  inFLIXimab (REMICADE) 100 MG injection Inject 100 mg into the vein every 6 (six) weeks.   Yes [provider]  loperamide (IMODIUM) 2 MG capsule Take 2 mg by mouth as needed for diarrhea or loose stools.   Yes [provider]  methocarbamol (ROBAXIN) 500 MG tablet Take 2 tablets (1,000 mg total) by mouth every 6 (six) hours as needed for muscle spasms. 06/02/19  Yes McClung, Angela M, PA-C  OLANZapine (ZYPREXA) 20 MG tablet Take 20 mg by mouth at bedtime.  Yes [provider]  rivaroxaban (XARELTO) 20 MG TABS tablet Take 1 tablet (20 mg total) by mouth daily with supper. After completing starter pack 06/02/19  Yes McClung, Angela M, PA-C  simethicone (MYLICON) 80 MG chewable tablet Chew 80 mg by mouth every 6 (six) hours as needed for flatulence.   Yes [provider]  Rivaroxaban 15 & 20 MG TBPK Follow package directions: Take one 1m tablet by mouth twice a day. On day 22, switch to one 222mtablet once a day. Take with food. Patient not taking: Reported on 07/11/2019 06/02/19   McArgentina DonovanPA-C    Allergies    Azathioprine, Ciprofloxacin,  Metronidazole, and Sulfa antibiotics  Review of Systems   Review of Systems  Respiratory: Positive for shortness of breath.   Cardiovascular: Positive for chest pain.  All other systems reviewed and are negative.   Physical Exam Updated Vital Signs BP (!) 130/94   Pulse 87   Temp 98 F (36.7 C) (Oral)   Resp 20   Ht 1.854 m (6' 1" )   Wt 117.9 kg   SpO2 95%   BMI 34.30 kg/m   Physical Exam Vitals and nursing note reviewed.  Constitutional:      General: He is not in acute distress.    Appearance: He is well-developed.  HENT:     Head: Normocephalic and atraumatic.     Right Ear: External ear normal.     Left Ear: External ear normal.  Eyes:     General: No scleral icterus.       Right eye: No discharge.        Left eye: No discharge.     Conjunctiva/sclera: Conjunctivae normal.  Neck:     Trachea: No tracheal deviation.  Cardiovascular:     Rate and Rhythm: Normal rate and regular rhythm.  Pulmonary:     Effort: Pulmonary effort is normal. No respiratory distress.     Breath sounds: Normal breath sounds. No stridor. No wheezing or rales.  Chest:     Chest wall: No edema.  Abdominal:     General: Bowel sounds are normal. There is no distension.     Palpations: Abdomen is soft.     Tenderness: There is no abdominal tenderness. There is no guarding or rebound.     Comments: Colostomy left lower quadrant  Musculoskeletal:        General: No tenderness.     Cervical back: Neck supple.     Right lower leg: Edema present.     Left lower leg: Edema present.     Comments: Mild pitting edema bilateral lower extremities  Skin:    General: Skin is warm and dry.     Findings: No rash.  Neurological:     Mental Status: He is alert.     Cranial Nerves: No cranial nerve deficit (no facial droop, extraocular movements intact, no slurred speech).     Sensory: No sensory deficit.     Motor: No abnormal muscle tone or seizure activity.     Coordination: Coordination normal.      ED Results / Procedures / Treatments   Labs (all labs ordered are listed, but only abnormal results are displayed) Labs Reviewed  BASIC METABOLIC PANEL - Abnormal; Notable for the following components:      Result Value   Glucose, Bld 122 (*)    Calcium 8.4 (*)    All other components within normal limits  CBC - Abnormal; Notable for  the following components:   Platelets 116 (*)    All other components within normal limits  CBG MONITORING, ED - Abnormal; Notable for the following components:   Glucose-Capillary 116 (*)    All other components within normal limits  POC SARS CORONAVIRUS 2 AG -  ED  TROPONIN I (HIGH SENSITIVITY)  TROPONIN I (HIGH SENSITIVITY)    EKG EKG Interpretation  Date/Time:  Sunday July 11 2019 09:15:47 EDT Ventricular Rate:  86 PR Interval:    QRS Duration: 94 QT Interval:  391 QTC Calculation: 468 R Axis:   -6 Text Interpretation: Sinus rhythm Low voltage, precordial leads No significant change since last tracing Confirmed by Dorie Rank [phone]) on 07/11/2019 9:27:26 AM   Radiology CT Angio Chest PE W and/or Wo Contrast  Result Date: 07/11/2019 CLINICAL DATA:  Chest pain and shortness of breath EXAM: CT ANGIOGRAPHY CHEST WITH CONTRAST TECHNIQUE: Multidetector CT imaging of the chest was performed using the standard protocol during bolus administration of intravenous contrast. Multiplanar CT image reconstructions and MIPs were obtained to evaluate the vascular anatomy. CONTRAST:  28m OMNIPAQUE IOHEXOL 350 MG/ML SOLN COMPARISON:  May 22, 2019 FINDINGS: Cardiovascular: Satisfactory opacification of the pulmonary arteries to the segmental level. No small filling defect is identified in a subsegmental right lower lobe pulmonary artery image 189 series 7 consistent with chronic infarct slightly decreased compared to prior CT of May 22, 2019. No acute pulmonary embolus is identified. Normal heart size. No pericardial effusion. Mediastinum/Nodes: No  enlarged mediastinal, hilar, or axillary lymph nodes. Thyroid gland, trachea, and esophagus demonstrate no significant findings. Lungs/Pleura: Atelectasis of the by a lateral posterior lung bases and bilateral posterior upper lobes are identified. There is no focal pneumonia. There are probable minimal bilateral pleural effusions. Upper Abdomen: Diffuse low density of the liver is identified without focal liver lesion. No acute abnormality is identified in the visualized upper abdominal structures. There is probably a gallstone in the gallbladder. No inflammation is noted around the gallbladder. Musculoskeletal: No acute abnormality. Review of the MIP images confirms the above findings. IMPRESSION: 1. No acute pulmonary embolus. 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 2. Atelectasis of the lateral posterior lung bases and bilateral posterior upper lobes. Probable minimal bilateral pleural effusions. 3. Fatty infiltration of liver. Electronically Signed   By: WAbelardo DieselM.D.   On: 07/11/2019 12:42   DG Chest Portable 1 View  Result Date: 07/11/2019 CLINICAL DATA:  Patient with chest pain. EXAM: PORTABLE CHEST 1 VIEW COMPARISON:  Chest radiograph 06/06/2019 FINDINGS: Port-A-Cath tip projects over the superior vena cava. Monitoring leads overlie the patient. Stable cardiac and mediastinal contours. Low lung volumes. No consolidative pulmonary opacities. No pleural effusion or pneumothorax. IMPRESSION: No acute cardiopulmonary process. Electronically Signed   By: DLovey NewcomerM.D.   On: 07/11/2019 09:53    Procedures Procedures (including critical care time)  Medications Ordered in ED Medications  morphine 4 MG/ML injection 4 mg (4 mg Intravenous Given 07/11/19 1019)  ondansetron (ZOFRAN) injection 4 mg (4 mg Intravenous Given 07/11/19 1116)  iohexol (OMNIPAQUE) 350 MG/ML injection 75 mL (75 mLs Intravenous  Contrast Given 07/11/19 1209)    ED Course  I have reviewed the triage vital signs and the nursing notes.  Pertinent labs & imaging results that were available during my care of the patient were reviewed by me and considered in my medical decision making (see chart for details).  Clinical Course as of Jul 10 1405  Sun Jul 11, 2019  1120 Laboratory tests and chest x-ray without acute findings   [JK]    Clinical Course User Index [JK] Dorie Rank, MD   MDM Rules/Calculators/A&P                      Pt presented to the ED with chest pain.  Hx of recent PE.  ED workup reassuring.  No sign of recurrent PE.  No PNA.  ACS unlikely.  Stable for dc.  Discussed importance of him taking his medications.  He needs to continue to take that daily and will be on it for several months.  Per EMR he does have refills available for that medication. Final Clinical Impression(s) / ED Diagnoses Final diagnoses:  Chest pain on breathing    Rx / DC Orders ED Discharge Orders    None       Dorie Rank, MD 07/11/19 1407

## 2019-07-11 NOTE — ED Notes (Signed)
Patient verbalizes understanding of discharge instructions. Opportunity for questioning and answers were provided. Armband removed by staff, pt discharged from ED.  

## 2019-07-14 ENCOUNTER — Ambulatory Visit: Payer: Self-pay | Admitting: Nurse Practitioner

## 2019-07-19 MED FILL — BENZTROPINE MES 2 MG TABLET: 2 | 30 days supply | Qty: 30 | Fill #0

## 2019-07-19 MED FILL — DULoxetine HCL 60 MG CPEP: 60 | 30 days supply | Qty: 30 | Fill #0

## 2019-07-19 MED FILL — DIVALPROEX SOD DR 500 MG TA: 500 | 30 days supply | Qty: 60 | Fill #0

## 2019-07-20 ENCOUNTER — Emergency Department (HOSPITAL_COMMUNITY): Payer: Self-pay

## 2019-07-20 ENCOUNTER — Emergency Department (HOSPITAL_COMMUNITY)
Admission: EM | Admit: 2019-07-20 | Discharge: 2019-07-20 | Disposition: A | Discharge status: Home / Self Care | Payer: Self-pay | Attending: Emergency Medicine | Admitting: Emergency Medicine

## 2019-07-20 ENCOUNTER — Other Ambulatory Visit: Payer: Self-pay

## 2019-07-20 DIAGNOSIS — Z5321 Procedure and treatment not carried out due to patient leaving prior to being seen by health care provider: Secondary | ICD-10-CM | POA: Insufficient documentation

## 2019-07-20 DIAGNOSIS — M25512 Pain in left shoulder: Secondary | ICD-10-CM | POA: Insufficient documentation

## 2019-07-20 DIAGNOSIS — M549 Dorsalgia, unspecified: Secondary | ICD-10-CM | POA: Insufficient documentation

## 2019-07-20 DIAGNOSIS — M79605 Pain in left leg: Secondary | ICD-10-CM | POA: Insufficient documentation

## 2019-07-20 NOTE — Progress Notes (Signed)
Subjective:    Patient ID: Juan Hester, male    DOB: 1980-07-11, 39 y.o.   MRN: [id number]  This is a complex 39 year old male who just arrived here from the [place] area and currently is living with relatives who presents for evaluation of pulmonary embolism history, Crohn's disease, history of colon carcinoma status post sigmoid resection with colostomy formation, and need for paperwork regarding patient's work status.  The patient currently is living in a hotel.  Patient arrived here in December.  The patient in [place] was under care by gastroenterologist was on Remicade for Crohn's disease and also being followed for his oncology status.  He is due for a colonoscopy at this time.  He works at [employer] he has history of bipolar disorder and had previously been on Cogentin Zyprexa, Depakote and Cymbalta.  Patient is also had major depression as well in the past.  The patient states he has significant volume output from his colostomy with a diarrheal type syndrome and he does have Imodium for this.  The patient has abdominal pain at times.  He has chronic fatigue.  He cannot stand and one place for prolonged periods of time.  He fell at work and his lower back has been difficult for him since that fall.  He was in the emergency room and underwent CT scan of the head and this was negative.  The patient is needing refills on his medications and he does not have insurance at this time.  Note there was an ER visit in February where a pulmonary embolism was diagnosed the patient infarct in the right lower lobe now is on Xarelto and needs refills on this medication  Patient also needs follow-up lab work at this visit  Past Medical History:  Diagnosis Date  . Crohn disease ([place])   . PE (pulmonary thromboembolism) ([place])   . Thyroid disease      History reviewed. No pertinent family history.   Social History   Socioeconomic History  . Marital status: Single    Spouse name: Not on file  .  Number of children: Not on file  . Years of education: Not on file  . Highest education level: Not on file  Occupational History  . Not on file  Tobacco Use  . Smoking status: Current Every Day Smoker    Packs/day: 1.00  . Smokeless tobacco: Never Used  Substance and Sexual Activity  . Alcohol use: Yes  . Drug use: Not Currently  . Sexual activity: Yes  Other Topics Concern  . Not on file  Social History Narrative  . Not on file   Social Determinants of Health   Financial Resource Strain:   . Difficulty of Paying Living Expenses:   Food Insecurity:   . Worried About Charity fundraiser in the Last Year:   . Arboriculturist in the Last Year:   Transportation Needs:   . Film/video editor (Medical):   Marland Kitchen Lack of Transportation (Non-Medical):   Physical Activity:   . Days of Exercise per Week:   . Minutes of Exercise per Session:   Stress:   . Feeling of Stress :   Social Connections:   . Frequency of Communication with Friends and Family:   . Frequency of Social Gatherings with Friends and Family:   . Attends Religious Services:   . Active Member of Clubs or Organizations:   . Attends Archivist Meetings:   Marland Kitchen Marital Status:   Intimate  Partner Violence:   . Fear of Current or Ex-Partner:   . Emotionally Abused:   Marland Kitchen Physically Abused:   . Sexually Abused:      Allergies  Allergen Reactions  . Azathioprine Other (See Comments)    Pt does not recall reaction   . Ciprofloxacin Other (See Comments)    Pt does not recall reaction   . Metronidazole Other (See Comments)    Pt does not recall reaction   . Sulfa Antibiotics Other (See Comments)    Pt does not recall reaction   . Barium-Containing Compounds Rash     Outpatient Medications Prior to Visit  Medication Sig Dispense Refill  . simethicone (MYLICON) 80 MG chewable tablet Chew 80 mg by mouth every 6 (six) hours as needed for flatulence.    . divalproex (DEPAKOTE) 500 MG DR tablet Take 500 mg by  mouth 2 (two) times daily.    . DULoxetine (CYMBALTA) 60 MG capsule Take 60 mg by mouth every morning.    . hydrochlorothiazide (HYDRODIURIL) 25 MG tablet Take 25 mg by mouth daily.    Marland Kitchen loperamide (IMODIUM) 2 MG capsule Take 2 mg by mouth as needed for diarrhea or loose stools.    . rivaroxaban (XARELTO) 20 MG TABS tablet Take 1 tablet (20 mg total) by mouth daily with supper. After completing starter pack 30 tablet 4  . acetaminophen (TYLENOL) 500 MG tablet Take 1,000 mg by mouth every 6 (six) hours as needed for mild pain, moderate pain or headache.    . inFLIXimab (REMICADE) 100 MG injection Inject 100 mg into the vein every 6 (six) weeks.    . methocarbamol (ROBAXIN) 500 MG tablet Take 2 tablets (1,000 mg total) by mouth every 6 (six) hours as needed for muscle spasms. (Patient not taking: Reported on 07/21/2019) 90 tablet 0  . OLANZapine (ZYPREXA) 20 MG tablet Take 20 mg by mouth at bedtime.    . Rivaroxaban 15 & 20 MG TBPK Follow package directions: Take one 28m tablet by mouth twice a day. On day 22, switch to one 221mtablet once a day. Take with food. (Patient not taking: Reported on 07/11/2019) 51 each 0   No facility-administered medications prior to visit.      Review of Systems  Constitutional: Positive for activity change.  HENT: Negative.   Eyes: Negative.   Respiratory: Negative for cough, shortness of breath and wheezing.   Cardiovascular: Negative for chest pain, palpitations and leg swelling.  Gastrointestinal: Positive for abdominal pain, diarrhea and nausea. Negative for abdominal distention, anal bleeding, blood in stool, constipation, rectal pain and vomiting.  Endocrine: Negative.   Genitourinary: Negative.   Musculoskeletal: Positive for back pain.  Neurological: Negative.   Psychiatric/Behavioral: Positive for decreased concentration, dysphoric mood and sleep disturbance. The patient is nervous/anxious.        Objective:   Physical Exam Vitals:   07/21/19  1034  BP: 109/83  Pulse: 99  Temp: 98.7 F (37.1 C)  SpO2: 96%  Weight: 264 lb 3.2 oz (119.8 kg)  Height: 6' 1"  (1.854 m)    Gen: Pleasant, well-nourished, in no distress,  normal affect  ENT: No lesions,  mouth clear,  oropharynx clear, no postnasal drip  Neck: No JVD, no TMG, no carotid bruits  Lungs: No use of accessory muscles, no dullness to percussion, clear without rales or rhonchi, Port-A-Cath placed in chest  Cardiovascular: RRR, heart sounds normal, no murmur or gallops, no peripheral edema  Abdomen: soft and NT, no  HSM,  BS normal, colostomy bag in place with watery stool in the bag  Musculoskeletal: No deformities, no cyanosis or clubbing  Neuro: alert, non focal  Skin: Warm, no lesions or rashes  CT Head Wo Contrast  Result Date: 07/20/2019 CLINICAL DATA:  Fall with head injury. Anticoagulated on Xarelto. EXAM: CT HEAD WITHOUT CONTRAST TECHNIQUE: Contiguous axial images were obtained from the base of the skull through the vertex without intravenous contrast. COMPARISON:  05/22/2019 head CT. FINDINGS: Brain: No evidence of parenchymal hemorrhage or extra-axial fluid collection. No mass lesion, mass effect, or midline shift. No CT evidence of acute infarction. Cerebral volume is age appropriate. No ventriculomegaly. Vascular: No acute abnormality. Skull: No evidence of calvarial fracture. Sinuses/Orbits: No fluid levels. Mucoperiosteal thickening in the maxillary sinuses bilaterally. Other:  The mastoid air cells are unopacified. IMPRESSION: 1. No evidence of acute intracranial abnormality. No evidence of calvarial fracture. 2. Paranasal sinusitis, chronic appearing. Electronically Signed   By: Ilona Sorrel M.D.   On: 07/20/2019 12:02    CT of chest previously obtained was reviewed and showed no pulmonary emboli and no nodules.  Note patient stated he thought there were nodules presents in previous CTs.  He has an old pulmonary infarct in the right lower lobe area this may  been where the nodule was present is not visualized now      Assessment & Plan:  I personally reviewed all images and lab data in the [facility] system as well as any outside material available during this office visit and agree with the  radiology impressions.   Multiple subsegmental pulmonary emboli without acute cor pulmonale (HCC) History of multiple segmental pulmonary emboli February 2021 now on Xarelto  Until we can determine the patient's cancer status we will continue Xarelto for now 20 mg daily and obtain refills for this patient  Crohn's disease with complication ([place]) History of Crohn's disease with complications patient was on Remicade  We will have the patient apply for financial assistance in the meantime will refer to gastroenterology for further evaluation  Acute bilateral low back pain without sciatica Status post fall with acute bilateral lower back pain  Exam is unrevealing  Plan will be to prescribe Lidoderm patch and cyclobenzaprine  Bipolar 1 disorder ([place]) Bipolar disorder currently treated in the past with Zyprexa Depakote and Cogentin we will refill these medications this visit and I have asked the patient to connect with licensed clinical social worker at this visit so we can get this patient into mental health care  Colostomy, evaluate ([place]) Colostomy needs to be reevaluated by gastroenterology  History of colon cancer History of colon cancer  The patient will need repeat examination to see what his colon cancer status is at this time   Diagnoses and all orders for this visit:  Multiple subsegmental pulmonary emboli without acute cor pulmonale (HCC) -     rivaroxaban (XARELTO) 20 MG TABS tablet; Take 1 tablet (20 mg total) by mouth daily with supper.  Bipolar 1 disorder (HCC)  Acute bilateral low back pain without sciatica  History of colon cancer -     Ambulatory referral to Gastroenterology  Crohn's disease of small and large intestines with  complication ([place]) -     Ambulatory referral to Gastroenterology  Colostomy, evaluate ([place]) -     Ambulatory referral to Gastroenterology  Other orders -     divalproex (DEPAKOTE) 500 MG DR tablet; Take 1 tablet (500 mg total) by mouth 2 (two) times daily. -  DULoxetine (CYMBALTA) 60 MG capsule; Take 1 capsule (60 mg total) by mouth every morning. -     hydrochlorothiazide (HYDRODIURIL) 25 MG tablet; Take 1 tablet (25 mg total) by mouth daily. -     loperamide (IMODIUM) 2 MG capsule; Take 1 capsule (2 mg total) by mouth as needed for diarrhea or loose stools. -     OLANZapine (ZYPREXA) 20 MG tablet; Take 1 tablet (20 mg total) by mouth at bedtime. -     cyclobenzaprine (FLEXERIL) 10 MG tablet; Take 1 tablet (10 mg total) by mouth 3 (three) times daily as needed for muscle spasms. -     lidocaine (LIDODERM) 5 %; Place 1 patch onto the skin daily. Remove & Discard patch within 12 hours or as directed by MD   Note I spent 15 minutes filling out the patient's paperwork for his attorney

## 2019-07-20 NOTE — ED Triage Notes (Addendum)
Pt here for continuing back, shoulder, and L leg pain after fall at work two days ago. Pt slipped in a puddle of water and hit shoulders and head, did not pass out. Reports nausea today. Pt currently on Xarelto for PE.

## 2019-07-20 NOTE — ED Notes (Signed)
No answer x 2 .

## 2019-07-21 ENCOUNTER — Other Ambulatory Visit: Payer: Self-pay

## 2019-07-21 ENCOUNTER — Ambulatory Visit: Payer: Self-pay | Attending: Family Medicine | Admitting: Critical Care Medicine

## 2019-07-21 ENCOUNTER — Encounter: Payer: Self-pay | Admitting: Critical Care Medicine

## 2019-07-21 ENCOUNTER — Ambulatory Visit: Payer: Self-pay | Admitting: Family Medicine

## 2019-07-21 VITALS — BP 109/83 | HR 99 | Temp 98.7°F | Ht 73.0 in | Wt 264.2 lb

## 2019-07-21 DIAGNOSIS — F319 Bipolar disorder, unspecified: Secondary | ICD-10-CM

## 2019-07-21 DIAGNOSIS — M545 Low back pain, unspecified: Secondary | ICD-10-CM

## 2019-07-21 DIAGNOSIS — I2694 Multiple subsegmental pulmonary emboli without acute cor pulmonale: Secondary | ICD-10-CM

## 2019-07-21 DIAGNOSIS — Z85038 Personal history of other malignant neoplasm of large intestine: Secondary | ICD-10-CM | POA: Insufficient documentation

## 2019-07-21 DIAGNOSIS — Z433 Encounter for attention to colostomy: Secondary | ICD-10-CM

## 2019-07-21 DIAGNOSIS — K50819 Crohn's disease of both small and large intestine with unspecified complications: Secondary | ICD-10-CM

## 2019-07-21 HISTORY — DX: Multiple subsegmental thrombotic pulmonary emboli without acute cor pulmonale: I26.94

## 2019-07-21 MED ORDER — DIVALPROEX SODIUM 500 MG PO DR TAB: 500.0000 mg | DELAYED_RELEASE_TABLET | Freq: Two times a day (BID) | ORAL | 3 refills | Status: DC

## 2019-07-21 MED ORDER — HYDROCHLOROTHIAZIDE 25 MG PO TABS: 25.0000 mg | ORAL_TABLET | Freq: Every day | ORAL | 4 refills | Status: DC

## 2019-07-21 MED ORDER — LOPERAMIDE HCL 2 MG PO CAPS: 2.0000 mg | ORAL_CAPSULE | ORAL | 3 refills | Status: DC | PRN

## 2019-07-21 MED ORDER — LIDOCAINE 5 % EX PTCH: 1.0000 | MEDICATED_PATCH | CUTANEOUS | 0 refills | Status: DC

## 2019-07-21 MED ORDER — RIVAROXABAN 20 MG PO TABS: 20.0000 mg | ORAL_TABLET | Freq: Every day | ORAL | 4 refills | Status: DC

## 2019-07-21 MED ORDER — DULOXETINE HCL 60 MG PO CPEP: 60.0000 mg | ORAL_CAPSULE | Freq: Every morning | ORAL | 4 refills | Status: DC

## 2019-07-21 MED ORDER — CYCLOBENZAPRINE HCL 10 MG PO TABS: 10.0000 mg | ORAL_TABLET | Freq: Three times a day (TID) | ORAL | 0 refills | Status: DC | PRN

## 2019-07-21 MED ORDER — OLANZAPINE 20 MG PO TABS: 20.0000 mg | ORAL_TABLET | Freq: Every day | ORAL | 4 refills | Status: DC

## 2019-07-21 MED FILL — LIDOCAINE PATCH 5%: 5 | 30 days supply | Qty: 30 | Fill #0

## 2019-07-21 MED FILL — CYCLOBENZAPRINE 10 MG TAB: 10 | 30 days supply | Qty: 30 | Fill #0

## 2019-07-21 MED FILL — HYDROCHLOROTHIAZIDE 25 MG T: 25 | 30 days supply | Qty: 30 | Fill #0

## 2019-07-21 MED FILL — XARELTO 20 MG TABLET: 20 | 30 days supply | Qty: 30 | Fill #0

## 2019-07-21 NOTE — Assessment & Plan Note (Signed)
History of Crohn's disease with complications patient was on Remicade  We will have the patient apply for financial assistance in the meantime will refer to gastroenterology for further evaluation

## 2019-07-21 NOTE — Assessment & Plan Note (Signed)
History of colon cancer  The patient will need repeat examination to see what his colon cancer status is at this time

## 2019-07-21 NOTE — Assessment & Plan Note (Signed)
Status post fall with acute bilateral lower back pain  Exam is unrevealing  Plan will be to prescribe Lidoderm patch and cyclobenzaprine

## 2019-07-21 NOTE — Progress Notes (Signed)
Discuss forms that needs to be completed  Per pt he have not been able to afford his Cymbalta, Remicade, Imodium, Robaxin and Zyprexa so he have not been taking them

## 2019-07-21 NOTE — Patient Instructions (Signed)
Refills to our pharmacy  Referral to GI made

## 2019-07-21 NOTE — Assessment & Plan Note (Signed)
Bipolar disorder currently treated in the past with Zyprexa Depakote and Cogentin we will refill these medications this visit and I have asked the patient to connect with licensed clinical social worker at this visit so we can get this patient into mental health care

## 2019-07-21 NOTE — Assessment & Plan Note (Signed)
History of multiple segmental pulmonary emboli February 2021 now on Xarelto  Until we can determine the patient's cancer status we will continue Xarelto for now 20 mg daily and obtain refills for this patient

## 2019-07-21 NOTE — Assessment & Plan Note (Signed)
Colostomy needs to be reevaluated by gastroenterology

## 2019-08-12 ENCOUNTER — Other Ambulatory Visit: Payer: Self-pay

## 2019-08-12 ENCOUNTER — Ambulatory Visit: Payer: Self-pay | Attending: Critical Care Medicine | Admitting: Critical Care Medicine

## 2019-08-12 ENCOUNTER — Encounter: Payer: Self-pay | Admitting: Critical Care Medicine

## 2019-08-12 DIAGNOSIS — I2694 Multiple subsegmental pulmonary emboli without acute cor pulmonale: Secondary | ICD-10-CM

## 2019-08-12 DIAGNOSIS — M5441 Lumbago with sciatica, right side: Secondary | ICD-10-CM

## 2019-08-12 DIAGNOSIS — M5442 Lumbago with sciatica, left side: Secondary | ICD-10-CM

## 2019-08-12 MED ORDER — PREDNISONE 20 MG PO TABS: ORAL_TABLET | ORAL | 0 refills | Status: DC

## 2019-08-12 MED ORDER — NAPROXEN 500 MG PO TABS: 500.0000 mg | ORAL_TABLET | Freq: Two times a day (BID) | ORAL | 0 refills | Status: DC

## 2019-08-12 MED FILL — NAPROXEN 500 MG TABLET: 500 | 30 days supply | Qty: 60 | Fill #0

## 2019-08-12 MED FILL — predniSONE 20 MG TABS: 20 | 5 days supply | Qty: 10 | Fill #0

## 2019-08-12 NOTE — Assessment & Plan Note (Signed)
Continue Xarelto as prescribed  We will watch for potential adverse reactions with Xarelto and the use of Naprosyn  Return for an in office exam later in May

## 2019-08-12 NOTE — Progress Notes (Signed)
Subjective:    Patient ID: Juan Hester, male    DOB: [birth year], 39 y.o.   MRN: [id number] Virtual Visit via Telephone Note  I connected with Juan Hester on 08/12/19 at  8:30 AM EDT by telephone and verified that I am speaking with the correct person using two identifiers.   Consent:  I discussed the limitations, risks, security and privacy concerns of performing an evaluation and management service by telephone and the availability of in person appointments. I also discussed with the patient that there may be a patient responsible charge related to this service. The patient expressed understanding and agreed to proceed.  Location of patient: The patient is at home  Location of provider: I am in the office  Persons participating in the televisit with the patient.   No one else on the call    History of Present Illness:   This is a complex 39 year old male who just arrived here from the [place] area and currently is living with relatives who presents for evaluation of pulmonary embolism history, Crohn's disease, history of colon carcinoma status post sigmoid resection with colostomy formation, and need for paperwork regarding patient's work status.  The patient currently is living in a hotel.  Patient arrived here in December.  The patient in [place] was under care by gastroenterologist was on Remicade for Crohn's disease and also being followed for his oncology status.  He is due for a colonoscopy at this time.  He works at [employer] he has history of bipolar disorder and had previously been on Cogentin Zyprexa, Depakote and Cymbalta.  Patient is also had major depression as well in the past.  The patient states he has significant volume output from his colostomy with a diarrheal type syndrome and he does have Imodium for this.  The patient has abdominal pain at times.  He has chronic fatigue.  He cannot stand and one place for prolonged periods of time.  He fell at work and his  lower back has been difficult for him since that fall.  He was in the emergency room and underwent CT scan of the head and this was negative.  The patient is needing refills on his medications and he does not have insurance at this time.  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  The patient states his back pain is worsening.  He did fall at work and March.  He states the pain was tolerable but now markedly worse if he stands for long periods of time.  He was not able to use a Lidoderm patch as he cannot reach the center part of his back.  He notes pain shooting down both legs.  He has some difficulty walking as well at this time.  There has not been any lumbar imaging studies done although when he went to the emergency room previously there was a CT scan of the abdomen was done and did not show changes in the lumbar spine but this was a limited study  Patient states his cyclobenzaprine is not useful but he is not taking it on a regular basis The patient has no change in his breathing status and is  maintaining his anticoagulant for history of pulmonary emboli.  The patient is yet to achieve a gastroenterology appointment as he needs the orange card first.  He has his financial paperwork together so make an appointment for the financial counselor  Back Pain Associated symptoms include abdominal pain. Pertinent negatives include no chest pain.   Past Medical History:  Diagnosis Date  . Crohn disease ([place])   . PE (pulmonary thromboembolism) ([place])   . Thyroid disease      History reviewed. No pertinent family history.   Social History   Socioeconomic History  . Marital status: Single    Spouse  name: Not on file  . Number of children: Not on file  . Years of education: Not on file  . Highest education level: Not on file  Occupational History  . Not on file  Tobacco Use  . Smoking status: Current Every Day Smoker    Packs/day: 1.00  . Smokeless tobacco: Never Used  Substance and Sexual Activity  . Alcohol use: Yes  . Drug use: Not Currently  . Sexual activity: Yes  Other Topics Concern  . Not on file  Social History Narrative  . Not on file   Social Determinants of Health   Financial Resource Strain:   . Difficulty of Paying Living Expenses:   Food Insecurity:   . Worried About Charity fundraiser in the Last Year:   . Arboriculturist in the Last Year:   Transportation Needs:   . Film/video editor (Medical):   Marland Kitchen Lack of Transportation (Non-Medical):   Physical Activity:   . Days of Exercise per Week:   . Minutes of Exercise per Session:   Stress:   . Feeling of Stress :   Social Connections:   . Frequency of Communication with Friends and Family:   . Frequency of Social Gatherings with Friends and Family:   . Attends Religious Services:   . Active Member of Clubs or Organizations:   . Attends Archivist Meetings:   Marland Kitchen Marital Status:   Intimate Partner Violence:   . Fear of Current or Ex-Partner:   . Emotionally Abused:   Marland Kitchen Physically Abused:   . Sexually Abused:      Allergies  Allergen Reactions  . Azathioprine Other (See Comments)    Pt does not recall reaction   . Ciprofloxacin Other (See Comments)    Pt does not recall reaction   . Metronidazole Other (See Comments)    Pt does not recall reaction   . Sulfa Antibiotics Other (See Comments)    Pt does not recall reaction   . Barium-Containing Compounds Rash     Outpatient Medications Prior to Visit  Medication Sig Dispense Refill  . cyclobenzaprine (FLEXERIL) 10 MG tablet Take 1 tablet (10 mg total) by mouth 3 (three) times daily as needed for muscle spasms. 30 tablet 0  .  divalproex (DEPAKOTE) 500 MG DR tablet Take 1 tablet (500 mg total) by mouth 2 (two) times daily. 60 tablet 3  . DULoxetine (CYMBALTA) 60 MG capsule Take 1 capsule (60 mg total) by mouth every morning. 30 capsule 4  . hydrochlorothiazide (HYDRODIURIL) 25 MG tablet Take 1 tablet (25 mg total) by mouth daily. 30 tablet 4  . inFLIXimab (REMICADE) 100 MG injection Inject 100 mg into the vein every 6 (six) weeks.    Marland Kitchen loperamide (IMODIUM) 2 MG capsule Take 1 capsule (2 mg total) by mouth as needed for diarrhea or loose  stools. 30 capsule 3  . OLANZapine (ZYPREXA) 20 MG tablet Take 1 tablet (20 mg total) by mouth at bedtime. 30 tablet 4  . rivaroxaban (XARELTO) 20 MG TABS tablet Take 1 tablet (20 mg total) by mouth daily with supper. 30 tablet 4  . simethicone (MYLICON) 80 MG chewable tablet Chew 80 mg by mouth every 6 (six) hours as needed for flatulence.    . lidocaine (LIDODERM) 5 % Place 1 patch onto the skin daily. Remove & Discard patch within 12 hours or as directed by MD 30 patch 0   No facility-administered medications prior to visit.      Review of Systems  Constitutional: Positive for activity change.  HENT: Negative.   Eyes: Negative.   Respiratory: Negative for cough, shortness of breath and wheezing.   Cardiovascular: Negative for chest pain, palpitations and leg swelling.  Gastrointestinal: Positive for abdominal pain, diarrhea and nausea. Negative for abdominal distention, anal bleeding, blood in stool, constipation, rectal pain and vomiting.  Endocrine: Negative.   Genitourinary: Negative.   Musculoskeletal: Positive for back pain.  Neurological: Negative.   Psychiatric/Behavioral: Positive for decreased concentration, dysphoric mood and sleep disturbance. The patient is nervous/anxious.        Objective:   Physical Exam  There were no vitals filed for this visit. No exam, telephone visit  CT of chest previously obtained was reviewed and showed no pulmonary emboli and  no nodules.  Note patient stated he thought there were nodules presents in previous CTs.  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   Acute bilateral low back pain without sciatica Acute back pain status post fall in the lumbosacral area with bilateral radiculopathy  Plan is to obtain a lumbosacral spine series and to begin naproxen 500 mg twice daily and use cyclobenzaprine on a scheduled basis 3 times daily also give pulse prednisone 40 mg a day for 5 days and discontinue  Once the patient achieves an orange card will make a referral to orthopedics and gastroenterology  Multiple subsegmental pulmonary emboli without acute cor pulmonale ([place]) Continue Xarelto as prescribed  We will watch for potential adverse reactions with Xarelto and the use of Naprosyn  Return for an in office exam later in May   Norval was seen today for back pain.  Diagnoses and all orders for this visit:  Acute bilateral low back pain with bilateral sciatica -     DG Lumbar Spine Complete; Future  Multiple subsegmental pulmonary emboli without acute cor pulmonale (HCC)  Other orders -     naproxen (NAPROSYN) 500 MG tablet; Take 1 tablet (500 mg total) by mouth 2 (two) times daily with a meal. -     predniSONE (DELTASONE) 20 MG tablet; Take 2 tablets daily for 5 days then stop    Follow Up Instructions: Patient knows to get a lumbar sacral spine film and we did send prescriptions to our pharmacy   I discussed the assessment and treatment plan with the patient. The patient was provided an opportunity to ask questions and all were answered. The patient agreed with the plan and demonstrated an understanding of the  instructions.   The patient was  advised to call back or seek an in-person evaluation if the symptoms worsen or if the condition fails to improve as anticipated.  I provided 30 minutes of non-face-to-face time during this encounter  including  median intraservice time , review of notes, labs, imaging, medications  and explaining diagnosis and management to the patient .    Asencion Noble, MD

## 2019-08-12 NOTE — Assessment & Plan Note (Signed)
Acute back pain status post fall in the lumbosacral area with bilateral radiculopathy  Plan is to obtain a lumbosacral spine series and to begin naproxen 500 mg twice daily and use cyclobenzaprine on a scheduled basis 3 times daily also give pulse prednisone 40 mg a day for 5 days and discontinue  Once the patient achieves an orange card will make a referral to orthopedics and gastroenterology

## 2019-08-12 NOTE — Progress Notes (Signed)
Had a slip at work and injured back.

## 2019-08-16 ENCOUNTER — Telehealth: Payer: Self-pay | Admitting: Critical Care Medicine

## 2019-08-16 MED ORDER — FUROSEMIDE 20 MG PO TABS: 20.0000 mg | ORAL_TABLET | Freq: Every day | ORAL | 3 refills | Status: DC

## 2019-08-16 MED FILL — NAPROXEN 500 MG TABLET: 500 | 30 days supply | Qty: 60 | Fill #0

## 2019-08-16 MED FILL — predniSONE 20 MG TABS: 20 | 5 days supply | Qty: 10 | Fill #0

## 2019-08-16 NOTE — Telephone Encounter (Signed)
The patient will start lasix 44m daily  Please add the patient to my schedule Wednesday face to face

## 2019-08-16 NOTE — Telephone Encounter (Signed)
Patient called in and requested to speak with pcp regarding the extreme swelling in his feet and legs. Patient stated he is unable to wear his shoes currently.Patient wondered if it was edema. Patient requested for a call at your earliest convenience.

## 2019-08-16 NOTE — Telephone Encounter (Signed)
Patinet called in and requested for an update regarding his MRI of the spine. Please follow up at your earliest convenience.

## 2019-08-17 ENCOUNTER — Emergency Department (HOSPITAL_COMMUNITY): Payer: Self-pay

## 2019-08-17 ENCOUNTER — Encounter (HOSPITAL_COMMUNITY): Payer: Self-pay

## 2019-08-17 ENCOUNTER — Emergency Department (HOSPITAL_COMMUNITY)
Admission: EM | Admit: 2019-08-17 | Discharge: 2019-08-17 | Disposition: A | Discharge status: Home / Self Care | Payer: Self-pay | Attending: Emergency Medicine | Admitting: Emergency Medicine

## 2019-08-17 DIAGNOSIS — F17210 Nicotine dependence, cigarettes, uncomplicated: Secondary | ICD-10-CM | POA: Insufficient documentation

## 2019-08-17 DIAGNOSIS — M546 Pain in thoracic spine: Secondary | ICD-10-CM | POA: Insufficient documentation

## 2019-08-17 DIAGNOSIS — R39198 Other difficulties with micturition: Secondary | ICD-10-CM | POA: Insufficient documentation

## 2019-08-17 DIAGNOSIS — M545 Low back pain, unspecified: Secondary | ICD-10-CM

## 2019-08-17 DIAGNOSIS — Z79899 Other long term (current) drug therapy: Secondary | ICD-10-CM | POA: Insufficient documentation

## 2019-08-17 DIAGNOSIS — Z86711 Personal history of pulmonary embolism: Secondary | ICD-10-CM | POA: Insufficient documentation

## 2019-08-17 DIAGNOSIS — Z933 Colostomy status: Secondary | ICD-10-CM | POA: Insufficient documentation

## 2019-08-17 DIAGNOSIS — R600 Localized edema: Secondary | ICD-10-CM | POA: Insufficient documentation

## 2019-08-17 DIAGNOSIS — E079 Disorder of thyroid, unspecified: Secondary | ICD-10-CM | POA: Insufficient documentation

## 2019-08-17 DIAGNOSIS — Z7901 Long term (current) use of anticoagulants: Secondary | ICD-10-CM | POA: Insufficient documentation

## 2019-08-17 NOTE — Telephone Encounter (Signed)
Patient is currently in the ED to be evaluated for back pain.

## 2019-08-17 NOTE — ED Provider Notes (Addendum)
Care assumed from S. Upstill PA-C at shift change pending neuro exam, chest xray, post void residual, rectal exam.  See her note for full H&P.    Briefly this is a 39 yo male with history of PE on Xarelto, colon cancer s/p colostomy presenting to emergency department with multiple complaints including back pain x 5 weeks after mechanical fall from standing, urinary retention x 2 weeks. Per patient PCP is planning for MRI this week but patient was unable to wait because of worsening pain.     Physical Exam  BP 116/60 (BP Location: Right Arm)   Pulse 100   Temp 98.2 F (36.8 C) (Oral)   Resp 16   SpO2 100%   Physical Exam   PE: Constitutional: well-developed, well-nourished, no apparent distress HENT: normocephalic, atraumatic. no cervical adenopathy Cardiovascular: normal rate and rhythm, distal pulses intact Pulmonary/Chest: effort normal; breath sounds clear and equal bilaterally; no wheezes or rales Abdominal: soft and non tender. Colostomy left lower quadrant Musculoskeletal: full ROM. Bilateral lower extremity edema Neurological: alert with goal directed thinking Mental Status:  Alert, oriented, thought content appropriate, able to give a coherent history. Speech fluent without evidence of aphasia. Able to follow 2 step commands without difficulty.  Cranial Nerves:  II:  Peripheral visual fields grossly normal, pupils equal, round, reactive to light III,IV, VI: ptosis not present, extra-ocular motions intact bilaterally  V,VII: smile symmetric, facial light touch sensation equal VIII: hearing grossly normal to voice  X: uvula elevates symmetrically  XI: bilateral shoulder shrug symmetric and strong XII: midline tongue extension without fassiculations Motor:  Normal tone. 5/5 in upper and lower extremities bilaterally including strong and equal grip strength and dorsiflexion/plantar flexion Sensory: Pinprick and light touch normal in all extremities.  Deep Tendon Reflexes: 2+  and symmetric in the biceps and patella Cerebellar: normal finger-to-nose with bilateral upper extremities Gait: normal gait and balance CV: distal pulses palpable throughout  Skin: warm and dry, no rash, no diaphoresis Psychiatric: normal mood and affect, normal behavior       ED Course/Procedures   DG Chest 2 View  Result Date: 08/17/2019 CLINICAL DATA:  Peripheral edema. EXAM: CHEST - 2 VIEW COMPARISON:  07/11/2019 and 06/06/2019 FINDINGS: Port-A-Cath in place with the tip in the superior vena cava just below the carina in good position. The heart size and pulmonary vascularity normal. The lungs are clear. No bone abnormality. No effusions. IMPRESSION: No acute abnormalities. Electronically Signed   By: Lorriane Shire M.D.   On: 08/17/2019 08:05    Clinical Course as of Aug 16 1000  Tue Aug 17, 2019  0730 Patient refused rectal exam   [KA]  0903 Consulted case management to discuss patient needing ostomy supplies.    [KA]    Clinical Course User Index [KA] Laakea Pereira, Harley Hallmark, PA-C     MDM  Patient received in sign out. Please see previous provider note to include MDM to this point   I viewed pt's chest xray and it does not suggest acute infectious processes or signs of volume overload. Patient is not hypoxic. His lungs are clear to auscultation in all fields. He was noted to be tachycardic to 115 in triage, during my exam and multiple reassessments heart rate has been <100, consistently in the 90s. He reports compliance with his Xarelto and has not missed any doses. Do not feel emergent CT scan is needed at this time.  Patient adamantly refused rectal exam. He tells me he would need to be  sedated in order to have it performed. I discussed the risks of not having it performed as we are concerned for cauda equina. He is agreeable to a visual exam. Exam was performed with chaperone present. He is able to squeeze his buttocks and denies saddle anesthesia. Neuro exam is otherwise  unremarkable. He is 2+ patellar reflexes bilaterally. He ambulates with steady gait.  Post void residual was performed and is 0. With his symptom of urinary retention x 2 weeks, a 0 post void residual, normal neuro exam feel that cauda equina is unlikely cause of back pain.   Patient needed ostomy supplies as he ran out at home and does not receive more until 08/28/2019. Case management was able to assist and provided patient with supplies.   The patient appears reasonably screened and/or stabilized for discharge and I doubt any other medical condition or other [facility] requiring further screening, evaluation, or treatment in the ED at this time prior to discharge. The patient is safe for discharge with strict return precautions discussed. Recommend pcp follow up for further evaluation of his pain. Patient is agreeable with plan of care.   Portions of this note were generated with Lobbyist. Dictation errors may occur despite best attempts at proofreading.     Cherre Robins, PA-C 08/17/19 1120    Flint Melter 08/17/19 1121    Maudie Flakes, MD 08/18/19 1030

## 2019-08-17 NOTE — ED Notes (Signed)
Pt given new flange and pt replaced himself. Social worker is working on any other supplies we may be able to send home with him.

## 2019-08-17 NOTE — ED Triage Notes (Signed)
Pt comes via [facility] for back pain that has been going on for 5 weeks after a fall he had at work, pt now having incontinence of bladder. Pt has appt with PCP on Wed who wants to get MRI but was unable to make it until then due to the pain.

## 2019-08-17 NOTE — ED Notes (Signed)
Pt came back in to ED from being outside and stated, "did you figure it out yet."

## 2019-08-17 NOTE — ED Notes (Signed)
Pt moved into trauma bay to get undressed and into a gown. Pt given a urinal and instructed to try and urinate and afterwards will do bladder scan.

## 2019-08-17 NOTE — ED Notes (Signed)
Ordered pt ostomy supplies from [employer]

## 2019-08-17 NOTE — ED Notes (Signed)
Patient verbalizes understanding of discharge instructions. Opportunity for questioning and answers were provided. Armband removed by staff, pt discharged from ED ambulatory to bus stop

## 2019-08-17 NOTE — ED Notes (Addendum)
Pt was asking how much longer it will be. Pt leaned on nurses desk and starting flicking things off his shirt towards EMT. Pt also had feces on hands. EMT asked pt to not be flicking things off his shirt towards tech. Pt stated, "what the fuck is wrong with you" and walked out.

## 2019-08-17 NOTE — ED Notes (Signed)
ED Provider at bedside. 

## 2019-08-17 NOTE — Progress Notes (Signed)
Patient came in due to no supplies for ostomy. History of chrones and colon Ca. Surgery was in [place] .Recently moved here to [place]. Is engaged with [employer]. Just got off the phone with [employer] before we spoke. They are sending him three months worth of suppliesPhylange. He has plenty of bags. Currently awaiting SSI and Medicaid approval. Consulted with Wound Ostomy Nurse Judeen Hammans. Will give him some phalanges today to get him through until shipment comes from [place]. He should then have all supplies needed. Will also Give him bus pass to get back to place of origin, across town at [facility]. Patient voiced that he is  extremely appreciative of all staff are doing to assist him

## 2019-08-17 NOTE — Discharge Instructions (Addendum)
Follow-up with your primary care doctor to further work-up and evaluate your back pain.  Return to the emergency room for any new or worsening symptoms.

## 2019-08-17 NOTE — ED Provider Notes (Signed)
[facility] Provider Note   CSN: [id number] Arrival date & time: 08/17/19  0418     History Chief Complaint  Patient presents with  . Back Pain    Juan Hester is a 39 y.o. male.  Patient with history of colon CA s/p colostomy, bipolar DO, recent bilateral low back and left thoracic back pain, being followed and evaluated by PCP Joya Gaskins), presents tonight with several concerns: He has ongoing pain in his back. His lower back pain is bilateral, constant, worse with activity, worsening the longer he is up and active. His thoracic back pain is left sided and he describes intermittent sharp shooting pain that is debilitating for several seconds then gets better. He reports that last night (08/16/19) he had an episode of the thoracic pain that caused his whole body to stiffen and become "rigid" to where he could not move. He also complains of difficulty urinating described as a quick onset feeling of having urgency to urinate but cannot go when he gets to the bathroom. There has been no incontinence. The last time he urinated was around 9:00 pm 08/16/19 and he reports full control. The difficulty urinating has been ongoing for the past 2 weeks. He also reports increased bilateral lower extremity edema, worsening over the last 2 weeks. He has had exertional SOB since being diagnosed with PE recently that he reports is worse now. He also complains that his colostomy bag is leaking and is requesting new supplies.  He denies fever at any time. No cough or chest pain. He reports having nausea with vomiting but only when his pain is extreme. He has been able to eat and drink per usual and take his medications regularly.  The history is provided by the patient. No language interpreter was used.  Back Pain Associated symptoms: no abdominal pain, no chest pain and no fever        Past Medical History:  Diagnosis Date  . Crohn disease ([place])   . PE (pulmonary  thromboembolism) ([place])   . Thyroid disease     Patient Active Problem List   Diagnosis Date Noted  . Multiple subsegmental pulmonary emboli without acute cor pulmonale ([place]) 07/21/2019  . Bipolar 1 disorder ([place]) 07/21/2019  . Acute bilateral low back pain without sciatica 07/21/2019  . History of colon cancer 07/21/2019  . Crohn's disease with complication ([place]) 02/06/8526  . Colostomy, evaluate ([place]) 05/05/2019    Past Surgical History:  Procedure Laterality Date  . ABDOMINAL SURGERY    . COLON SURGERY    . SMALL INTESTINE SURGERY         No family history on file.  Social History   Tobacco Use  . Smoking status: Current Every Day Smoker    Packs/day: 1.00  . Smokeless tobacco: Never Used  Substance Use Topics  . Alcohol use: Yes  . Drug use: Not Currently    Home Medications Prior to Admission medications   Medication Sig Start Date End Date Taking? Authorizing Provider  cyclobenzaprine (FLEXERIL) 10 MG tablet Take 1 tablet (10 mg total) by mouth 3 (three) times daily as needed for muscle spasms. 07/21/19   [name], MD  divalproex (DEPAKOTE) 500 MG DR tablet Take 1 tablet (500 mg total) by mouth 2 (two) times daily. 07/21/19   [name], MD  DULoxetine (CYMBALTA) 60 MG capsule Take 1 capsule (60 mg total) by mouth every morning. 07/21/19   [name], MD  furosemide (LASIX)  20 MG tablet Take 1 tablet (20 mg total) by mouth daily. 08/16/19   [name], MD  inFLIXimab (REMICADE) 100 MG injection Inject 100 mg into the vein every 6 (six) weeks.    [provider]  loperamide (IMODIUM) 2 MG capsule Take 1 capsule (2 mg total) by mouth as needed for diarrhea or loose stools. 07/21/19   [name], MD  naproxen (NAPROSYN) 500 MG tablet Take 1 tablet (500 mg total) by mouth 2 (two) times daily with a meal. 08/12/19   [name], MD  OLANZapine (ZYPREXA) 20 MG tablet Take 1 tablet (20 mg total) by mouth at bedtime. 07/21/19   [name], MD  predniSONE (DELTASONE) 20 MG tablet Take 2 tablets daily for 5 days then stop 08/12/19   [name], MD  rivaroxaban (XARELTO) 20 MG TABS tablet Take 1 tablet (20 mg total) by mouth daily with supper. 07/21/19   [name], MD  simethicone (MYLICON) 80 MG chewable tablet Chew 80 mg by mouth every 6 (six) hours as needed for flatulence.    [provider]    Allergies    Azathioprine, Ciprofloxacin, Metronidazole, Sulfa antibiotics, and Barium-containing compounds  Review of Systems   Review of Systems  Constitutional: Negative for chills and fever.  HENT: Negative.   Respiratory: Positive for shortness of breath.   Cardiovascular: Positive for leg swelling. Negative for chest pain.  Gastrointestinal: Negative for abdominal pain.  Genitourinary: Positive for difficulty urinating.  Musculoskeletal: Positive for back pain.  Skin: Negative.   Neurological: Negative.     Physical Exam Updated Vital Signs BP 125/81 (BP Location: Left Arm)   Pulse (!) 115   Temp 98.2 F (36.8 C) (Oral)   Resp 17   SpO2 97%   Physical Exam Vitals and nursing note reviewed.  Constitutional:      Appearance: He is well-developed.  HENT:     Head: Normocephalic.  Cardiovascular:     Rate and Rhythm: Regular rhythm. Tachycardia present.  Pulmonary:     Effort: Pulmonary effort is normal.     Breath sounds: Normal breath sounds. No wheezing, rhonchi or rales.  Abdominal:     General: Bowel sounds are normal.     Palpations: Abdomen is soft.     Tenderness: There is no abdominal tenderness. There is no guarding or rebound.  Musculoskeletal:        General: Normal range of motion.     Cervical back: Normal range of motion and neck supple.     Right lower leg: Edema present.     Left lower leg: Edema present.     Comments: Full lower extremity strength that is symmetric bilaterally.   Skin:    General: Skin is warm and dry.     Findings: No rash.   Neurological:     Mental Status: He is alert and oriented to person, place, and time.     Sensory: No sensory deficit.     ED Results / Procedures / Treatments   Labs (all labs ordered are listed, but only abnormal results are displayed) Labs Reviewed - No data to display  EKG None  Radiology No results found.  Procedures Procedures (including critical care time)  Medications Ordered in ED Medications - No data to display  ED Course  I have reviewed the triage vital signs and the nursing notes.  Pertinent labs & imaging results that were available during my care of the patient  were reviewed by me and considered in my medical decision making (see chart for details).    MDM Rules/Calculators/A&P                      Patient to the ED with multiple complaints as per HPI.   He reports worsening back pain with 2 week onset of urinary urgency, being unable to urinate at times, normal urination at others. In the setting of back pain will evaluate the patient for signs of cauda equina. He will need measurement of post-void residual, full neuro exam including eval of rectal tone. He is currently in a hallway bed and a full neuro exam is not possible. Have requested movement into a room for exam.   Chest x-ray performed due to peripheral edema and c/o worsening SOB. No hypoxia. He reports compliance with Xarelto for recently diagnosed PE. With peripheral edema, feel he needs evaluation for pulmonary edema, but if no hypoxia with ambulation, negative CXR, do not feel he will need repeat CTA.    Colostomy supplies are being sought by nursing.   Patient care signed out to [facility], [facility]--C, to complete evaluation and determine appropriate disposition.   Final Clinical Impression(s) / ED Diagnoses Final diagnoses:  None   1. Back pain 2. Peripheral edema 3. Colostomy complication  Rx / DC Orders ED Discharge Orders    None       Charlann Lange, PA-C 08/17/19  2003    Merryl Hacker, MD 09/12/19 2303

## 2019-08-18 ENCOUNTER — Ambulatory Visit (HOSPITAL_COMMUNITY)
Admission: RE | Admit: 2019-08-18 | Discharge: 2019-08-18 | Disposition: A | Discharge status: Home / Self Care | Payer: Self-pay | Source: Ambulatory Visit | Attending: Critical Care Medicine | Admitting: Critical Care Medicine

## 2019-08-18 ENCOUNTER — Other Ambulatory Visit: Payer: Self-pay | Admitting: Family Medicine

## 2019-08-18 ENCOUNTER — Encounter: Payer: Self-pay | Admitting: Critical Care Medicine

## 2019-08-18 ENCOUNTER — Other Ambulatory Visit: Payer: Self-pay

## 2019-08-18 ENCOUNTER — Ambulatory Visit: Payer: Self-pay | Attending: Critical Care Medicine | Admitting: Critical Care Medicine

## 2019-08-18 VITALS — BP 118/85 | HR 92 | Temp 98.0°F | Resp 18 | Ht 73.0 in | Wt 274.0 lb

## 2019-08-18 DIAGNOSIS — R600 Localized edema: Secondary | ICD-10-CM

## 2019-08-18 DIAGNOSIS — M545 Low back pain, unspecified: Secondary | ICD-10-CM

## 2019-08-18 DIAGNOSIS — R0609 Other forms of dyspnea: Secondary | ICD-10-CM

## 2019-08-18 DIAGNOSIS — Z114 Encounter for screening for human immunodeficiency virus [HIV]: Secondary | ICD-10-CM

## 2019-08-18 DIAGNOSIS — M5442 Lumbago with sciatica, left side: Secondary | ICD-10-CM | POA: Insufficient documentation

## 2019-08-18 DIAGNOSIS — R0600 Dyspnea, unspecified: Secondary | ICD-10-CM

## 2019-08-18 DIAGNOSIS — M5441 Lumbago with sciatica, right side: Secondary | ICD-10-CM | POA: Insufficient documentation

## 2019-08-18 MED ORDER — TRAMADOL HCL 50 MG PO TABS: 50.0000 mg | ORAL_TABLET | Freq: Three times a day (TID) | ORAL | 0 refills | Status: DC | PRN

## 2019-08-18 MED ORDER — NAPROXEN 500 MG PO TABS: 500.0000 mg | ORAL_TABLET | Freq: Two times a day (BID) | ORAL | 0 refills | Status: DC

## 2019-08-18 MED ORDER — METHOCARBAMOL 500 MG PO TABS: 500.0000 mg | ORAL_TABLET | Freq: Four times a day (QID) | ORAL | 1 refills | Status: DC | PRN

## 2019-08-18 MED ORDER — TRAMADOL HCL 50 MG PO TABS: 50.0000 mg | ORAL_TABLET | Freq: Three times a day (TID) | ORAL | 0 refills | Status: AC | PRN

## 2019-08-18 MED ORDER — FUROSEMIDE 20 MG PO TABS: 20.0000 mg | ORAL_TABLET | Freq: Every day | ORAL | 3 refills | Status: DC

## 2019-08-18 MED FILL — METHOCARBAMOL 500 MG TABS: 500 | 15 days supply | Qty: 60 | Fill #0

## 2019-08-18 MED FILL — traMADol HCL 50 MG TABS: 50 | 7 days supply | Qty: 20 | Fill #0

## 2019-08-18 NOTE — Patient Instructions (Addendum)
Start furosemide one daily Trial tramadol one every 8 hours as needed for pain,  Robaxin one 4 times a day for back spasm  Labs to day to evaluate your breathing and swelling and to do an HIV screen  Xray of back: obtain today at [facility] hospital department of radiology  Get your financial paperwork filled out and make appt with Clifton James the financial counselor so we can get you into Gastroenterology and a spine specialist and MRI of spine  Stop smoking, use nicotine lozenges 46m three times a day to stop smoking  Return Dr WJoya GaskinsVideo visit one week   Smoking Tobacco Information, Adult Smoking tobacco can be harmful to your health. Tobacco contains a poisonous (toxic), colorless chemical called nicotine. Nicotine is addictive. It changes the brain and can make it hard to stop smoking. Tobacco also has other toxic chemicals that can hurt your body and raise your risk of many cancers. How can smoking tobacco affect me? Smoking tobacco puts you at risk for:  Cancer. Smoking is most commonly associated with lung cancer, but can also lead to cancer in other parts of the body.  Chronic obstructive pulmonary disease (COPD). This is a long-term lung condition that makes it hard to breathe. It also gets worse over time.  High blood pressure (hypertension), heart disease, stroke, or heart attack.  Lung infections, such as pneumonia.  Cataracts. This is when the lenses in the eyes become clouded.  Digestive problems. This may include peptic ulcers, heartburn, and gastroesophageal reflux disease (GERD).  Oral health problems, such as gum disease and tooth loss.  Loss of taste and smell. Smoking can affect your appearance by causing:  Wrinkles.  Yellow or stained teeth, fingers, and fingernails. Smoking tobacco can also affect your social life, because:  It may be challenging to find places to smoke when away from home. Many workplaces, [employer] hotels, and public places are  tobacco-free.  Smoking is expensive. This is due to the cost of tobacco and the long-term costs of treating health problems from smoking.  Secondhand smoke may affect those around you. Secondhand smoke can cause lung cancer, breathing problems, and heart disease. Children of smokers have a higher risk for: ? Sudden infant death syndrome (SIDS). ? Ear infections. ? Lung infections. If you currently smoke tobacco, quitting now can help you:  Lead a longer and healthier life.  Look, smell, breathe, and feel better over time.  Save money.  Protect others from the harms of secondhand smoke. What actions can I take to prevent health problems? Quit smoking   Do not start smoking. Quit if you already do.  Make a plan to quit smoking and commit to it. Look for programs to help you and ask your health care provider for recommendations and ideas.  Set a date and write down all the reasons you want to quit.  Let your friends and family know you are quitting so they can help and support you. Consider finding friends who also want to quit. It can be easier to quit with someone else, so that you can support each other.  Talk with your health care provider about using nicotine replacement medicines to help you quit, such as gum, lozenges, patches, sprays, or pills.  Do not replace cigarette smoking with electronic cigarettes, which are commonly called e-cigarettes. The safety of e-cigarettes is not known, and some may contain harmful chemicals.  If you try to quit but return to smoking, stay positive. It is common to slip  up when you first quit, so take it one day at a time.  Be prepared for cravings. When you feel the urge to smoke, chew gum or suck on hard candy. Lifestyle  Stay busy and take care of your body.  Drink enough fluid to keep your urine pale yellow.  Get plenty of exercise and eat a healthy diet. This can help prevent weight gain after quitting.  Monitor your eating habits.  Quitting smoking can cause you to have a larger appetite than when you smoke.  Find ways to relax. Go out with friends or family to a movie or a restaurant where people do not smoke.  Ask your health care provider about having regular tests (screenings) to check for cancer. This may include blood tests, imaging tests, and other tests.  Find ways to manage your stress, such as meditation, yoga, or exercise. Where to find support To get support to quit smoking, consider:  Asking your health care provider for more information and resources.  Taking classes to learn more about quitting smoking.  Looking for local organizations that offer resources about quitting smoking.  Joining a support group for people who want to quit smoking in your local community.  Calling the smokefree.gov counselor helpline: 1-800-Quit-Now (731)212-1830) Where to find more information You may find more information about quitting smoking from:  HelpGuide.org: www.helpguide.org  https://hall.com/: smokefree.gov  American Lung Association: www.lung.org Contact a health care provider if you:  Have problems breathing.  Notice that your lips, nose, or fingers turn blue.  Have chest pain.  Are coughing up blood.  Feel faint or you pass out.  Have other health changes that cause you to worry. Summary  Smoking tobacco can negatively affect your health, the health of those around you, your finances, and your social life.  Do not start smoking. Quit if you already do. If you need help quitting, ask your health care provider.  Think about joining a support group for people who want to quit smoking in your local community. There are many effective programs that will help you to quit this behavior. This information is not intended to replace advice given to you by your health care provider. Make sure you discuss any questions you have with your health care provider. Document Revised: 12/25/2018 Document  Reviewed: 04/16/2016 Elsevier Patient Education  2020 [employer].

## 2019-08-18 NOTE — Progress Notes (Signed)
Per Pharmacy prescription is under Worker's comp and needs to be sent to outpatient. Original prescription from Dr Joya Gaskins; I have resent it to [facility].

## 2019-08-18 NOTE — Progress Notes (Signed)
Noted and thank you

## 2019-08-18 NOTE — Assessment & Plan Note (Signed)
Acute bilateral back pain from a fall the patient is yet to receive the x-ray of the spine he will receive this now.  The patient will also begin tramadol every 8 hours as needed for pain and Naprosyn 5 mg twice daily along with Robaxin 5 mg every 6 hours as needed for muscle spasticity  Once the patient gets financial assistance with an orange card we can get him referred to orthopedics and obtain an MRI

## 2019-08-18 NOTE — Progress Notes (Signed)
Subjective:    Patient ID: Juan Hester, male    DOB: Jan 17, 1981, 39 y.o.   MRN: [id number]  History of Present Illness:   This is a complex 39 year old male who just arrived here from the [place] area and currently is living with relatives who presents for evaluation of pulmonary embolism history, Crohn's disease, history of colon carcinoma status post sigmoid resection with colostomy formation, and need for paperwork regarding patient's work status.  The patient currently is living in a hotel.  Patient arrived here in December.  The patient in [place] was under care by gastroenterologist was on Remicade for Crohn's disease and also being followed for his oncology status.  He is due for a colonoscopy at this time.  He works at [employer] he has history of bipolar disorder and had previously been on Cogentin Zyprexa, Depakote and Cymbalta.  Patient is also had major depression as well in the past.  The patient states he has significant volume output from his colostomy with a diarrheal type syndrome and he does have Imodium for this.  The patient has abdominal pain at times.  He has chronic fatigue.  He cannot stand and one place for prolonged periods of time.  He fell at work and his lower back has been difficult for him since that fall.  He was in the emergency room and underwent CT scan of the head and this was negative.  The patient is needing refills on his medications and he does not have insurance at this time.  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  The patient states his back pain is worsening.  He did fall at work and March.  He states the pain was tolerable but now markedly worse if he  stands for long periods of time.  He was not able to use a Lidoderm patch as he cannot reach the center part of his back.  He notes pain shooting down both legs.  He has some difficulty walking as well at this time.  There has not been any lumbar imaging studies done although when he went to the emergency room previously there was a CT scan of the abdomen was done and did not show changes in the lumbar spine but this was a limited study  Patient states his cyclobenzaprine is not useful but he is not taking it on a regular basis The patient has no change in his breathing status and is maintaining his anticoagulant for history of pulmonary emboli.  The patient is yet to achieve a gastroenterology appointment as he needs the orange card first.  He has his financial paperwork together so make an appointment for the financial counselor  08/18/2019 The patient comes in with lower extremity edema and is just gone to the ER 1 day ago for progressive back pain which occurred on the job.  He has since been let go from the job he was working.  He has another job as well he works.  The patient is yet to receive his lumbar x-rays as ordered.  He does not have financial assistance as of yet to be able to achieve an MRI or orthopedics appointment.  Patient  notes increased edema in the lower extremities and some shortness of breath with exertion. Note I reviewed his CT scan of the chest March which did not show pulmonary emboli but did show atelectasis at the bases of the lungs.   Wt Readings from Last 3 Encounters: 08/18/19 : 274 lb (124.3 kg) 07/21/19 : 264 lb 3.2 oz (119.8 kg) 07/11/19 : 260 lb (117.9 kg)  Patient cannot sleep flat is still smoking half pack a day of cigarettes denies wheezing slight productive cough of green mucus no sinus complaints   Back Pain Associated symptoms include abdominal pain. Pertinent negatives include no chest pain.   Past Medical History:  Diagnosis Date  . Crohn disease  ([place])   . PE (pulmonary thromboembolism) ([place])   . Thyroid disease      History reviewed. No pertinent family history.   Social History   Socioeconomic History  . Marital status: Single    Spouse name: Not on file  . Number of children: Not on file  . Years of education: Not on file  . Highest education level: Not on file  Occupational History  . Not on file  Tobacco Use  . Smoking status: Current Every Day Smoker    Packs/day: 1.00  . Smokeless tobacco: Never Used  Substance and Sexual Activity  . Alcohol use: Yes  . Drug use: Not Currently  . Sexual activity: Yes  Other Topics Concern  . Not on file  Social History Narrative  . Not on file   Social Determinants of Health   Financial Resource Strain:   . Difficulty of Paying Living Expenses:   Food Insecurity:   . Worried About Charity fundraiser in the Last Year:   . Arboriculturist in the Last Year:   Transportation Needs:   . Film/video editor (Medical):   Marland Kitchen Lack of Transportation (Non-Medical):   Physical Activity:   . Days of Exercise per Week:   . Minutes of Exercise per Session:   Stress:   . Feeling of Stress :   Social Connections:   . Frequency of Communication with Friends and Family:   . Frequency of Social Gatherings with Friends and Family:   . Attends Religious Services:   . Active Member of Clubs or Organizations:   . Attends Archivist Meetings:   Marland Kitchen Marital Status:   Intimate Partner Violence:   . Fear of Current or Ex-Partner:   . Emotionally Abused:   Marland Kitchen Physically Abused:   . Sexually Abused:      Allergies  Allergen Reactions  . Azathioprine Other (See Comments)    Pt does not recall reaction   . Ciprofloxacin Other (See Comments)    Pt does not recall reaction   . Metronidazole Other (See Comments)    Pt does not recall reaction   . Sulfa Antibiotics Other (See Comments)    Pt does not recall reaction   . Barium-Containing Compounds Rash     Outpatient  Medications Prior to Visit  Medication Sig Dispense Refill  . divalproex (DEPAKOTE) 500 MG DR tablet Take 1 tablet (500 mg total) by mouth 2 (two) times daily. 60 tablet 3  . DULoxetine (CYMBALTA) 60 MG capsule Take 1 capsule (60 mg total) by mouth every morning. 30 capsule 4  . inFLIXimab (REMICADE) 100 MG injection Inject 100 mg into the vein every 6 (six) weeks.    Marland Kitchen loperamide (IMODIUM) 2 MG capsule Take 1 capsule (2 mg total)  by mouth as needed for diarrhea or loose stools. 30 capsule 3  . OLANZapine (ZYPREXA) 20 MG tablet Take 1 tablet (20 mg total) by mouth at bedtime. 30 tablet 4  . rivaroxaban (XARELTO) 20 MG TABS tablet Take 1 tablet (20 mg total) by mouth daily with supper. 30 tablet 4  . simethicone (MYLICON) 80 MG chewable tablet Chew 80 mg by mouth every 6 (six) hours as needed for flatulence.    . cyclobenzaprine (FLEXERIL) 10 MG tablet Take 1 tablet (10 mg total) by mouth 3 (three) times daily as needed for muscle spasms. 30 tablet 0  . furosemide (LASIX) 20 MG tablet Take 1 tablet (20 mg total) by mouth daily. (Patient not taking: Reported on 08/18/2019) 30 tablet 3  . naproxen (NAPROSYN) 500 MG tablet Take 1 tablet (500 mg total) by mouth 2 (two) times daily with a meal. (Patient not taking: Reported on 08/18/2019) 60 tablet 0  . predniSONE (DELTASONE) 20 MG tablet Take 2 tablets daily for 5 days then stop (Patient not taking: Reported on 08/18/2019) 10 tablet 0   No facility-administered medications prior to visit.      Review of Systems  Constitutional: Positive for activity change.  HENT: Negative.   Eyes: Negative.   Respiratory: Negative for cough, shortness of breath and wheezing.   Cardiovascular: Negative for chest pain, palpitations and leg swelling.  Gastrointestinal: Positive for abdominal pain, diarrhea and nausea. Negative for abdominal distention, anal bleeding, blood in stool, constipation, rectal pain and vomiting.  Endocrine: Negative.   Genitourinary:  Negative.   Musculoskeletal: Positive for back pain.  Neurological: Negative.   Psychiatric/Behavioral: Positive for decreased concentration, dysphoric mood and sleep disturbance. The patient is nervous/anxious.        Objective:   Physical Exam  Vitals:   08/18/19 1039  BP: 118/85  Pulse: 92  Resp: 18  Temp: 98 F (36.7 C)  SpO2: 98%  Weight: 274 lb (124.3 kg)  Height: 6' 1"  (1.854 m)    Gen: Pleasant, well-nourished, in no distress,  normal affect  ENT: No lesions,  mouth clear,  oropharynx clear, no postnasal drip  Neck: No JVD, no TMG, no carotid bruits  Lungs: No use of accessory muscles, no dullness to percussion, clear without rales or rhonchi  Cardiovascular: RRR, heart sounds normal, no murmur or gallops, 3+ peripheral edema  Abdomen: soft and NT, no HSM,  BS normal  Musculoskeletal: No deformities, no cyanosis or clubbing  Neuro: alert, non focal  Skin: Warm, no lesions or rashes  DG Chest 2 View  Result Date: 08/17/2019 CLINICAL DATA:  Peripheral edema. EXAM: CHEST - 2 VIEW COMPARISON:  07/11/2019 and 06/06/2019 FINDINGS: Port-A-Cath in place with the tip in the superior vena cava just below the carina in good position. The heart size and pulmonary vascularity normal. The lungs are clear. No bone abnormality. No effusions. IMPRESSION: No acute abnormalities. Electronically Signed   By: Lorriane Shire M.D.   On: 08/17/2019 08:05     CT of chest previously obtained was reviewed and showed no pulmonary emboli and no nodules.  Note patient stated he thought there were nodules presents in previous CTs.  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   Localized edema Lower extremity  edema with associated dyspnea on exertion  Will obtain basic metabolic panel, BNP, CBC,And D-dimer along with thyroid panel begin Lasix 20 mg daily,  Acute bilateral low back pain without sciatica Acute bilateral back pain from a fall the patient is yet to receive the x-ray of the spine he will receive this now.  The patient will also begin tramadol every 8 hours as needed for pain and Naprosyn 5 mg twice daily along with Robaxin 5 mg every 6 hours as needed for muscle spasticity  Once the patient gets financial assistance with an orange card we can get him referred to orthopedics and obtain an MRI   Diagnoses and all orders for this visit:  Localized edema -     Thyroid Panel With TSH -     Brain natriuretic peptide -     D-dimer, quantitative (not at [facility]) -     CBC with Differential/Platelet -     Comprehensive metabolic panel  Dyspnea on exertion -     Thyroid Panel With TSH -     Brain natriuretic peptide -     D-dimer, quantitative (not at [facility]) -     CBC with Differential/Platelet -     Comprehensive metabolic panel  Encounter for screening for HIV -     HIV Antibody (routine testing w rflx)  Acute bilateral low back pain without sciatica  Other orders -     furosemide (LASIX) 20 MG tablet; Take 1 tablet (20 mg total) by mouth daily. -     naproxen (NAPROSYN) 500 MG tablet; Take 1 tablet (500 mg total) by mouth 2 (two) times daily with a meal. -     methocarbamol (ROBAXIN) 500 MG tablet; Take 1 tablet (500 mg total) by mouth every 6 (six) hours as needed for muscle spasms. -     Discontinue: traMADol (ULTRAM) 50 MG tablet; Take 1 tablet (50 mg total) by mouth every 8 (eight) hours as needed for up to 5 days.  HIV study we also obtained

## 2019-08-18 NOTE — Assessment & Plan Note (Signed)
Lower extremity edema with associated dyspnea on exertion  Will obtain basic metabolic panel, BNP, CBC,And D-dimer along with thyroid panel begin Lasix 20 mg daily,

## 2019-08-18 NOTE — Progress Notes (Signed)
Patient with history of colon CA s/p colostomy, bipolar DO, C/o   bilateral low back and left thoracic back pain x 6 WKS BL swelling of the feet

## 2019-08-19 ENCOUNTER — Telehealth: Payer: Self-pay | Admitting: Critical Care Medicine

## 2019-08-19 DIAGNOSIS — M545 Low back pain, unspecified: Secondary | ICD-10-CM

## 2019-08-19 DIAGNOSIS — R600 Localized edema: Secondary | ICD-10-CM

## 2019-08-19 DIAGNOSIS — R7989 Other specified abnormal findings of blood chemistry: Secondary | ICD-10-CM

## 2019-08-19 LAB — COMPREHENSIVE METABOLIC PANEL
ALT: 136 IU/L — ABNORMAL HIGH (ref 0–44)
AST: 110 IU/L — ABNORMAL HIGH (ref 0–40)
Albumin/Globulin Ratio: 1.3 (ref 1.2–2.2)
Albumin: 3.9 g/dL — ABNORMAL LOW (ref 4.0–5.0)
Alkaline Phosphatase: 184 IU/L — ABNORMAL HIGH (ref 39–117)
BUN/Creatinine Ratio: 13 (ref 9–20)
BUN: 14 mg/dL (ref 6–20)
Bilirubin Total: 0.6 mg/dL (ref 0.0–1.2)
CO2: 22 mmol/L (ref 20–29)
Calcium: 8.9 mg/dL (ref 8.7–10.2)
Chloride: 103 mmol/L (ref 96–106)
Creatinine, Ser: 1.09 mg/dL (ref 0.76–1.27)
GFR calc Af Amer: 98 mL/min/{1.73_m2} (ref >59)
GFR calc non Af Amer: 85 mL/min/{1.73_m2} (ref >59)
Globulin, Total: 2.9 g/dL (ref 1.5–4.5)
Glucose: 91 mg/dL (ref 65–99)
Potassium: 4 mmol/L (ref 3.5–5.2)
Sodium: 142 mmol/L (ref 134–144)
Total Protein: 6.8 g/dL (ref 6.0–8.5)

## 2019-08-19 LAB — THYROID PANEL WITH TSH
Free Thyroxine Index: 3.3 (ref 1.2–4.9)
T3 Uptake Ratio: 33 % (ref 24–39)
T4, Total: 9.9 ug/dL (ref 4.5–12.0)
TSH: 0.659 u[IU]/mL (ref 0.450–4.500)

## 2019-08-19 LAB — CBC WITH DIFFERENTIAL/PLATELET
Basophils Absolute: 0 10*3/uL (ref 0.0–0.2)
Basos: 0 %
EOS (ABSOLUTE): 0.2 10*3/uL (ref 0.0–0.4)
Eos: 3 %
Hematocrit: 42.2 % (ref 37.5–51.0)
Hemoglobin: 14.5 g/dL (ref 13.0–17.7)
Immature Grans (Abs): 0.1 10*3/uL (ref 0.0–0.1)
Immature Granulocytes: 1 %
Lymphocytes Absolute: 0.6 10*3/uL — ABNORMAL LOW (ref 0.7–3.1)
Lymphs: 8 %
MCH: 31.7 pg (ref 26.6–33.0)
MCHC: 34.4 g/dL (ref 31.5–35.7)
MCV: 92 fL (ref 79–97)
Monocytes Absolute: 0.5 10*3/uL (ref 0.1–0.9)
Monocytes: 7 %
Neutrophils Absolute: 6.1 10*3/uL (ref 1.4–7.0)
Neutrophils: 81 %
Platelets: 141 10*3/uL — ABNORMAL LOW (ref 150–450)
RBC: 4.58 x10E6/uL (ref 4.14–5.80)
RDW: 15.8 % — ABNORMAL HIGH (ref 11.6–15.4)
WBC: 7.6 10*3/uL (ref 3.4–10.8)

## 2019-08-19 LAB — BRAIN NATRIURETIC PEPTIDE: BNP: 19.6 pg/mL (ref 0.0–100.0)

## 2019-08-19 LAB — D-DIMER, QUANTITATIVE: D-DIMER: 0.58 mg/L FEU — ABNORMAL HIGH (ref 0.00–0.49)

## 2019-08-19 LAB — HIV ANTIBODY (ROUTINE TESTING W REFLEX): HIV Screen 4th Generation wRfx: NONREACTIVE

## 2019-08-19 NOTE — Telephone Encounter (Signed)
Pt aware of results  Will order vascular doppler of LE and PT

## 2019-08-20 NOTE — Telephone Encounter (Signed)
Patient was informed, verbalized understanding.

## 2019-08-24 ENCOUNTER — Ambulatory Visit: Payer: MEDICAID | Attending: Critical Care Medicine | Admitting: Physical Therapy

## 2019-08-25 ENCOUNTER — Emergency Department (HOSPITAL_COMMUNITY): Payer: Self-pay

## 2019-08-25 ENCOUNTER — Emergency Department (HOSPITAL_COMMUNITY)
Admission: EM | Admit: 2019-08-25 | Discharge: 2019-08-25 | Disposition: A | Discharge status: Home / Self Care | Payer: Self-pay | Attending: Emergency Medicine | Admitting: Emergency Medicine

## 2019-08-25 ENCOUNTER — Other Ambulatory Visit: Payer: Self-pay

## 2019-08-25 ENCOUNTER — Encounter (HOSPITAL_COMMUNITY): Payer: Self-pay | Admitting: Emergency Medicine

## 2019-08-25 ENCOUNTER — Ambulatory Visit: Payer: Self-pay | Admitting: Critical Care Medicine

## 2019-08-25 DIAGNOSIS — R0602 Shortness of breath: Secondary | ICD-10-CM

## 2019-08-25 DIAGNOSIS — C189 Malignant neoplasm of colon, unspecified: Secondary | ICD-10-CM | POA: Insufficient documentation

## 2019-08-25 DIAGNOSIS — Z79899 Other long term (current) drug therapy: Secondary | ICD-10-CM | POA: Insufficient documentation

## 2019-08-25 DIAGNOSIS — F17210 Nicotine dependence, cigarettes, uncomplicated: Secondary | ICD-10-CM | POA: Insufficient documentation

## 2019-08-25 DIAGNOSIS — Z933 Colostomy status: Secondary | ICD-10-CM | POA: Insufficient documentation

## 2019-08-25 DIAGNOSIS — Z7901 Long term (current) use of anticoagulants: Secondary | ICD-10-CM | POA: Insufficient documentation

## 2019-08-25 DIAGNOSIS — K625 Hemorrhage of anus and rectum: Secondary | ICD-10-CM | POA: Insufficient documentation

## 2019-08-25 DIAGNOSIS — K5090 Crohn's disease, unspecified, without complications: Secondary | ICD-10-CM | POA: Insufficient documentation

## 2019-08-25 LAB — COMPREHENSIVE METABOLIC PANEL
ALT: 180 U/L — ABNORMAL HIGH (ref 0–44)
AST: 136 U/L — ABNORMAL HIGH (ref 15–41)
Albumin: 3.4 g/dL — ABNORMAL LOW (ref 3.5–5.0)
Alkaline Phosphatase: 140 U/L — ABNORMAL HIGH (ref 38–126)
Anion gap: 16 — ABNORMAL HIGH (ref 5–15)
BUN: 24 mg/dL — ABNORMAL HIGH (ref 6–20)
CO2: 21 mmol/L — ABNORMAL LOW (ref 22–32)
Calcium: 8.7 mg/dL — ABNORMAL LOW (ref 8.9–10.3)
Chloride: 101 mmol/L (ref 98–111)
Creatinine, Ser: 1.21 mg/dL (ref 0.61–1.24)
GFR calc Af Amer: >60 mL/min (ref >60)
GFR calc non Af Amer: >60 mL/min (ref >60)
Glucose, Bld: 121 mg/dL — ABNORMAL HIGH (ref 70–99)
Potassium: 3.7 mmol/L (ref 3.5–5.1)
Sodium: 138 mmol/L (ref 135–145)
Total Bilirubin: 1 mg/dL (ref 0.3–1.2)
Total Protein: 7.1 g/dL (ref 6.5–8.1)

## 2019-08-25 LAB — CBC WITH DIFFERENTIAL/PLATELET
Abs Immature Granulocytes: 0.24 10*3/uL — ABNORMAL HIGH (ref 0.00–0.07)
Basophils Absolute: 0.1 10*3/uL (ref 0.0–0.1)
Basophils Relative: 2 %
Eosinophils Absolute: 0.2 10*3/uL (ref 0.0–0.5)
Eosinophils Relative: 2 %
HCT: 46.2 % (ref 39.0–52.0)
Hemoglobin: 15.4 g/dL (ref 13.0–17.0)
Immature Granulocytes: 3 %
Lymphocytes Relative: 14 %
Lymphs Abs: 1.2 10*3/uL (ref 0.7–4.0)
MCH: 31.3 pg (ref 26.0–34.0)
MCHC: 33.3 g/dL (ref 30.0–36.0)
MCV: 93.9 fL (ref 80.0–100.0)
Monocytes Absolute: 1 10*3/uL (ref 0.1–1.0)
Monocytes Relative: 12 %
Neutro Abs: 5.9 10*3/uL (ref 1.7–7.7)
Neutrophils Relative %: 67 %
Platelets: 226 10*3/uL (ref 150–400)
RBC: 4.92 MIL/uL (ref 4.22–5.81)
RDW: 16.2 % — ABNORMAL HIGH (ref 11.5–15.5)
WBC: 8.7 10*3/uL (ref 4.0–10.5)
nRBC: 0 % (ref 0.0–0.2)

## 2019-08-25 LAB — PROTIME-INR
INR: 0.9 (ref 0.8–1.2)
Prothrombin Time: 11.9 seconds (ref 11.4–15.2)

## 2019-08-25 LAB — POC OCCULT BLOOD, ED: Fecal Occult Bld: POSITIVE — AB

## 2019-08-25 MED ORDER — ZINC OXIDE 11.3 % EX CREA
TOPICAL_CREAM | Freq: Two times a day (BID) | CUTANEOUS | Status: DC
  Filled 2019-08-25 (×2): qty 56

## 2019-08-25 MED ORDER — ZINC OXIDE 11.3 % EX CREA: 1.0000 "application " | TOPICAL_CREAM | Freq: Two times a day (BID) | CUTANEOUS | 1 refills | Status: DC

## 2019-08-25 MED ORDER — IOHEXOL 300 MG/ML  SOLN
100.0000 mL | Freq: Once | INTRAMUSCULAR | Status: AC | PRN
  Administered 2019-08-25: 100 mL via INTRAVENOUS

## 2019-08-25 NOTE — Progress Notes (Deleted)
Subjective:    Patient ID: Juan Hester, male    DOB: Apr 03, 1981, 39 y.o.   MRN: [id number]  History of Present Illness:   This is a complex 39 year old male who just arrived here from the [place] area and currently is living with relatives who presents for evaluation of pulmonary embolism history, Crohn's disease, history of colon carcinoma status post sigmoid resection with colostomy formation, and need for paperwork regarding patient's work status.  The patient currently is living in a hotel.  Patient arrived here in December.  The patient in [place] was under care by gastroenterologist was on Remicade for Crohn's disease and also being followed for his oncology status.  He is due for a colonoscopy at this time.  He works at [employer] he has history of bipolar disorder and had previously been on Cogentin Zyprexa, Depakote and Cymbalta.  Patient is also had major depression as well in the past.  The patient states he has significant volume output from his colostomy with a diarrheal type syndrome and he does have Imodium for this.  The patient has abdominal pain at times.  He has chronic fatigue.  He cannot stand and one place for prolonged periods of time.  He fell at work and his lower back has been difficult for him since that fall.  He was in the emergency room and underwent CT scan of the head and this was negative.  The patient is needing refills on his medications and he does not have insurance at this time.  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  The patient states his back pain is worsening.  He did fall at work and March.  He states the pain was tolerable but now markedly worse if he  stands for long periods of time.  He was not able to use a Lidoderm patch as he cannot reach the center part of his back.  He notes pain shooting down both legs.  He has some difficulty walking as well at this time.  There has not been any lumbar imaging studies done although when he went to the emergency room previously there was a CT scan of the abdomen was done and did not show changes in the lumbar spine but this was a limited study  Patient states his cyclobenzaprine is not useful but he is not taking it on a regular basis The patient has no change in his breathing status and is maintaining his anticoagulant for history of pulmonary emboli.  The patient is yet to achieve a gastroenterology appointment as he needs the orange card first.  He has his financial paperwork together so make an appointment for the financial counselor  08/18/2019 The patient comes in with lower extremity edema and is just gone to the ER 1 day ago for progressive back pain which occurred on the job.  He has since been let go from the job he was working.  He has another job as well he works.  The patient is yet to receive his lumbar x-rays as ordered.  He does not have financial assistance as of yet to be able to achieve an MRI or orthopedics appointment.  Patient  notes increased edema in the lower extremities and some shortness of breath with exertion. Note I reviewed his CT scan of the chest March which did not show pulmonary emboli but did show atelectasis at the bases of the lungs.   Wt Readings from Last 3 Encounters: 08/18/19 : 274 lb (124.3 kg) 07/21/19 : 264 lb 3.2 oz (119.8 kg) 07/11/19 : 260 lb (117.9 kg)  Patient cannot sleep flat is still smoking half pack a day of cigarettes denies wheezing slight productive cough of green mucus no sinus complaints  08/25/2019  Localized edema Lower extremity edema with associated dyspnea on exertion  Will obtain basic metabolic panel, BNP, CBC,And D-dimer along with  thyroid panel begin Lasix 20 mg daily,  Acute bilateral low back pain without sciatica Acute bilateral back pain from a fall the patient is yet to receive the x-ray of the spine he will receive this now.  The patient will also begin tramadol every 8 hours as needed for pain and Naprosyn 5 mg twice daily along with Robaxin 5 mg every 6 hours as needed for muscle spasticity  Once the patient gets financial assistance with an orange card we can get him referred to orthopedics and obtain an MRI   Diagnoses and all orders for this visit:  Localized edema -     Thyroid Panel With TSH -     Brain natriuretic peptide -     D-dimer, quantitative (not at [facility]) -     CBC with Differential/Platelet -     Comprehensive metabolic panel  Dyspnea on exertion -     Thyroid Panel With TSH -     Brain natriuretic peptide -     D-dimer, quantitative (not at [facility]) -     CBC with Differential/Platelet -     Comprehensive metabolic panel  Encounter for screening for HIV -     HIV Antibody (routine testing w rflx)  Acute bilateral low back pain without sciatica  Other orders -     furosemide (LASIX) 20 MG tablet; Take 1 tablet (20 mg total) by mouth daily. -     naproxen (NAPROSYN) 500 MG tablet; Take 1 tablet (500 mg total) by mouth 2 (two) times daily with a meal. -     methocarbamol (ROBAXIN) 500 MG tablet; Take 1 tablet (500 mg total) by mouth every 6 (six) hours as needed for muscle spasms. -     Discontinue: traMADol (ULTRAM) 50 MG tablet; Take 1 tablet (50 mg total) by mouth every 8 (eight) hours as needed for up to 5 days.  HIV study we also obtained   Back Pain Associated symptoms include abdominal pain. Pertinent negatives include no chest pain.   Past Medical History:  Diagnosis Date  . Crohn disease ([place])   . PE (pulmonary thromboembolism) ([place])   . Thyroid disease      No family history on file.   Social History   Socioeconomic History  . Marital status: Single    Spouse  name: Not on file  . Number of children: Not on file  . Years of education: Not on file  . Highest education level: Not on file  Occupational History  . Not on file  Tobacco Use  . Smoking status: Current Every Day Smoker    Packs/day: 1.00  . Smokeless tobacco: Never Used  Substance and Sexual Activity  . Alcohol use: Yes  . Drug use: Not Currently  . Sexual activity: Yes  Other Topics Concern  .  Not on file  Social History Narrative  . Not on file   Social Determinants of Health   Financial Resource Strain:   . Difficulty of Paying Living Expenses:   Food Insecurity:   . Worried About Charity fundraiser in the Last Year:   . Arboriculturist in the Last Year:   Transportation Needs:   . Film/video editor (Medical):   Marland Kitchen Lack of Transportation (Non-Medical):   Physical Activity:   . Days of Exercise per Week:   . Minutes of Exercise per Session:   Stress:   . Feeling of Stress :   Social Connections:   . Frequency of Communication with Friends and Family:   . Frequency of Social Gatherings with Friends and Family:   . Attends Religious Services:   . Active Member of Clubs or Organizations:   . Attends Archivist Meetings:   Marland Kitchen Marital Status:   Intimate Partner Violence:   . Fear of Current or Ex-Partner:   . Emotionally Abused:   Marland Kitchen Physically Abused:   . Sexually Abused:      Allergies  Allergen Reactions  . Azathioprine Other (See Comments)    Pt does not recall reaction   . Ciprofloxacin Other (See Comments)    Pt does not recall reaction   . Metronidazole Other (See Comments)    Pt does not recall reaction   . Sulfa Antibiotics Other (See Comments)    Pt does not recall reaction   . Barium-Containing Compounds Rash     Outpatient Medications Prior to Visit  Medication Sig Dispense Refill  . divalproex (DEPAKOTE) 500 MG DR tablet Take 1 tablet (500 mg total) by mouth 2 (two) times daily. 60 tablet 3  . DULoxetine (CYMBALTA) 60 MG  capsule Take 1 capsule (60 mg total) by mouth every morning. 30 capsule 4  . furosemide (LASIX) 20 MG tablet Take 1 tablet (20 mg total) by mouth daily. 30 tablet 3  . inFLIXimab (REMICADE) 100 MG injection Inject 100 mg into the vein every 6 (six) weeks.    Marland Kitchen loperamide (IMODIUM) 2 MG capsule Take 1 capsule (2 mg total) by mouth as needed for diarrhea or loose stools. 30 capsule 3  . methocarbamol (ROBAXIN) 500 MG tablet Take 1 tablet (500 mg total) by mouth every 6 (six) hours as needed for muscle spasms. 60 tablet 1  . naproxen (NAPROSYN) 500 MG tablet Take 1 tablet (500 mg total) by mouth 2 (two) times daily with a meal. 60 tablet 0  . OLANZapine (ZYPREXA) 20 MG tablet Take 1 tablet (20 mg total) by mouth at bedtime. 30 tablet 4  . rivaroxaban (XARELTO) 20 MG TABS tablet Take 1 tablet (20 mg total) by mouth daily with supper. 30 tablet 4  . simethicone (MYLICON) 80 MG chewable tablet Chew 80 mg by mouth every 6 (six) hours as needed for flatulence.     No facility-administered medications prior to visit.      Review of Systems  Constitutional: Positive for activity change.  HENT: Negative.   Eyes: Negative.   Respiratory: Negative for cough, shortness of breath and wheezing.   Cardiovascular: Negative for chest pain, palpitations and leg swelling.  Gastrointestinal: Positive for abdominal pain, diarrhea and nausea. Negative for abdominal distention, anal bleeding, blood in stool, constipation, rectal pain and vomiting.  Endocrine: Negative.   Genitourinary: Negative.   Musculoskeletal: Positive for back pain.  Neurological: Negative.   Psychiatric/Behavioral: Positive for decreased concentration, dysphoric  mood and sleep disturbance. The patient is nervous/anxious.        Objective:   Physical Exam  There were no vitals filed for this visit.  Gen: Pleasant, well-nourished, in no distress,  normal affect  ENT: No lesions,  mouth clear,  oropharynx clear, no postnasal  drip  Neck: No JVD, no TMG, no carotid bruits  Lungs: No use of accessory muscles, no dullness to percussion, clear without rales or rhonchi  Cardiovascular: RRR, heart sounds normal, no murmur or gallops, 3+ peripheral edema  Abdomen: soft and NT, no HSM,  BS normal  Musculoskeletal: No deformities, no cyanosis or clubbing  Neuro: alert, non focal  Skin: Warm, no lesions or rashes  No results found.   CT of chest previously obtained was reviewed and showed no pulmonary emboli and no nodules.  Note patient stated he thought there were nodules presents in previous CTs.  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   No problem-specific Assessment & Plan notes found for this encounter.   There are no diagnoses linked to this encounter.HIV study we also obtained

## 2019-08-25 NOTE — Discharge Instructions (Addendum)
As discussed, it is important that you follow-up with our gastroenterology colleagues. Please use the provided cream twice daily to the ulcerated area.  In addition, is very important to keep that area clean and dry.  Please use either normal saline or sterile water for this.  Return here for concerning changes in your condition.

## 2019-08-25 NOTE — ED Provider Notes (Signed)
[facility] DEPARTMENT Provider Note  CSN: [id number] Arrival date & time: 08/25/19  0715     History Chief Complaint  Patient presents with  . Rectal Bleeding    Juan Hester is a 39 y.o. male.  Juan Hester is a 39 year old male with a history of Crohn's and colon cancer status post chemoradiation & colectomy with colostomy and anal closure, PE on Xarelto and bipolar disorder who presents with rectal bleeding.   He reports a week or 2 of light rectal bleeding that has worsened such that blood now runs down his legs.  He reports he has not had rectal bleeding since a few days postop from the anal closure.  At that time his bowel was leaking into his intra-abdominal cavity and he went into septic shock.  He denies fevers or chills or abdominal pain.  He denies chest pain but reports chronic shortness of breath since his PEs that has progressively worsened over the last month especially with exertion.  He reports chronic but worsening dizziness without syncope.  He denies sore throat but endorses a dry throat secondary to dehydration.  He denies dysuria and falls.  He has chronic LE edema that he reports has worsened recently, especially on the L. He denies pain associated with the swelling but reports it can make it more difficult to walk. He denies a history of perianal Crohn's but does endorse fistulizing disease. Has recently been using naproxen due to acute lower back pain.  He takes his Xarelto daily.       Past Medical History:  Diagnosis Date  . Crohn disease ([place])   . PE (pulmonary thromboembolism) ([place])   . Thyroid disease   Colon cancer s/p chemoradiation and colectomy.  Patient Active Problem List   Diagnosis Date Noted  . Positive D dimer 08/19/2019  . Localized edema 08/18/2019  . Multiple subsegmental pulmonary emboli without acute cor pulmonale ([place]) 07/21/2019  . Bipolar 1 disorder ([place]) 07/21/2019  . Acute bilateral low back pain  without sciatica 07/21/2019  . History of colon cancer 07/21/2019  . Crohn's disease with complication ([place]) 10/09/9483  . Colostomy, evaluate ([place]) 05/05/2019    Past Surgical History:  Procedure Laterality Date  . ABDOMINAL SURGERY    . COLON SURGERY    . SMALL INTESTINE SURGERY       No family history on file. No family hx of htn or DM.  Social History   Tobacco Use  . Smoking status: Current Every Day Smoker    Packs/day: 1.00  . Smokeless tobacco: Never Used  Substance Use Topics  . Alcohol use: Yes  . Drug use: Not Currently   Home Medications Prior to Admission medications   Medication Sig Start Date End Date Taking? Authorizing Provider  clonazePAM (KLONOPIN) 0.5 MG tablet Take 0.5 mg by mouth 2 (two) times daily as needed for anxiety.   Yes [provider]  divalproex (DEPAKOTE) 500 MG DR tablet Take 1 tablet (500 mg total) by mouth 2 (two) times daily. 07/21/19  Yes [name], MD  DULoxetine (CYMBALTA) 60 MG capsule Take 1 capsule (60 mg total) by mouth every morning. 07/21/19  Yes [name], MD  methocarbamol (ROBAXIN) 500 MG tablet Take 1 tablet (500 mg total) by mouth every 6 (six) hours as needed for muscle spasms. 08/18/19  Yes [name], MD  naproxen (NAPROSYN) 500 MG tablet Take 1 tablet (500 mg total) by mouth 2 (two) times daily with a  meal. 08/18/19  Yes [name], MD  OLANZapine (ZYPREXA) 20 MG tablet Take 1 tablet (20 mg total) by mouth at bedtime. 07/21/19  Yes [name], MD  rivaroxaban (XARELTO) 20 MG TABS tablet Take 1 tablet (20 mg total) by mouth daily with supper. 07/21/19  Yes [name], MD  simethicone (MYLICON) 80 MG chewable tablet Chew 80 mg by mouth every 6 (six) hours as needed for flatulence.   Yes [provider]  traMADol (ULTRAM) 50 MG tablet Take 50 mg by mouth every 6 (six) hours as needed for moderate pain.   Yes [provider]  furosemide (LASIX) 20 MG tablet Take 1 tablet  (20 mg total) by mouth daily. 08/18/19   [name], MD  loperamide (IMODIUM) 2 MG capsule Take 1 capsule (2 mg total) by mouth as needed for diarrhea or loose stools. Patient not taking: Reported on 08/25/2019 07/21/19   [name], MD    Allergies    Azathioprine, Ciprofloxacin, Metronidazole, Sulfa antibiotics, and Barium-containing compounds  Review of Systems   Review of Systems  Constitutional: Negative for chills and fever.  HENT: Negative for sore throat.   Respiratory: Positive for shortness of breath.   Cardiovascular: Positive for leg swelling. Negative for chest pain.  Gastrointestinal: Negative for abdominal pain.       Rectal bleeding  Genitourinary: Negative for dysuria.  Musculoskeletal:       No falls  Neurological: Positive for dizziness. Negative for syncope.    Physical Exam Updated Vital Signs BP (!) 130/105   Pulse 88   Temp 98.2 F (36.8 C) (Oral)   Resp 18   Ht 6' 1"  (1.854 m)   Wt 122.5 kg   SpO2 97%   BMI 35.62 kg/m   Physical Exam Constitutional:      Appearance: Normal appearance.  HENT:     Head: Normocephalic and atraumatic.  Cardiovascular:     Rate and Rhythm: Regular rhythm. Tachycardia present.     Heart sounds: No murmur.     Comments: 2+ pitting edema Pulmonary:     Effort: Pulmonary effort is normal. No respiratory distress.     Breath sounds: Normal breath sounds. No wheezing or rales.  Abdominal:     Palpations: Abdomen is soft. Mass:       Comments: Hyperactive bowel sounds; LLQ ostomy in place productive of light brown stool; stool leaking out of ostomy; midline vertical surgical scar   Genitourinary:    Rectum: Guaiac result positive.     Comments: Non patent anus with possible dehiscence of closure and blood present Musculoskeletal:        General: Normal range of motion.     Cervical back: Normal range of motion.  Skin:    General: Skin is dry.  Neurological:     General: No focal deficit present.      Mental Status: He is alert and oriented to person, place, and time.  Psychiatric:        Mood and Affect: Mood normal.    ED Results / Procedures / Treatments   Labs (all labs ordered are listed, but only abnormal results are displayed) Labs Reviewed  COMPREHENSIVE METABOLIC PANEL - Abnormal; Notable for the following components:      Result Value   CO2 21 (*)    Glucose, Bld 121 (*)    BUN 24 (*)    Calcium 8.7 (*)    Albumin 3.4 (*)    AST  136 (*)    ALT 180 (*)    Alkaline Phosphatase 140 (*)    Anion gap 16 (*)    All other components within normal limits  CBC WITH DIFFERENTIAL/PLATELET - Abnormal; Notable for the following components:   RDW 16.2 (*)    Abs Immature Granulocytes 0.24 (*)    All other components within normal limits  POC OCCULT BLOOD, ED - Abnormal; Notable for the following components:   Fecal Occult Bld POSITIVE (*)    All other components within normal limits  PROTIME-INR    EKG EKG Interpretation  Date/Time:  Wednesday Aug 25 2019 08:02:41 EDT Ventricular Rate:  102 PR Interval:    QRS Duration: 89 QT Interval:  347 QTC Calculation: 452 R Axis:   5 Text Interpretation: Sinus tachycardia Baseline wander in lead(s) V2 No significant change since last tracing Confirmed by Blanchie Dessert [phone]) on 08/25/2019 8:04:28 AM   Radiology CT ABDOMEN PELVIS W CONTRAST  Result Date: 08/25/2019 CLINICAL DATA:  39 year old with acute onset of rectal bleeding that began earlier this morning. Personal history of colon cancer for which the patient underwent colectomy and descending colostomy approximately 2 years ago. Current history of Crohn's disease. EXAM: CT ABDOMEN AND PELVIS WITH CONTRAST TECHNIQUE: Multidetector CT imaging of the abdomen and pelvis was performed using the standard protocol following bolus administration of intravenous contrast. CONTRAST:  1105m OMNIPAQUE IOHEXOL 300 MG/ML IV. COMPARISON:  05/22/2019. FINDINGS: Lower chest: Respiratory  motion blurred images of the lung bases. Small bleb or air cyst in the LEFT LOWER LOBE. Visualized lung bases clear. Heart size normal. Hepatobiliary: Liver upper normal in size to mildly enlarged. Mildly irregular contour of the inferior RIGHT hepatic lobe. No focal hepatic parenchymal abnormality. Contracted gallbladder containing gallstones. No pericholecystic edema or inflammation. No biliary ductal dilation. Pancreas: While difficult to visualize in its entirety, visualized portions normal in appearance. Spleen: Borderline to mild splenomegaly, the spleen measuring approximately 14.8 x 5.1 x 16.1 cm, yielding a volume of approximately 420 mL. No focal splenic parenchymal abnormality. Adrenals/Urinary Tract: Normal appearing adrenal glands. Kidneys normal in size and appearance without focal parenchymal abnormality. No hydronephrosis. No evidence of urinary tract calculi. Urinary bladder decompressed and tethered to anterior abdominal wall due to scar from the prior colectomy. Stomach/Bowel: Stomach normal in appearance for the degree of distention. Mild wall thickening of several loops of jejunum and possibly proximal ileum in the LEFT mid abdomen and LEFT side of the pelvis. No significant adjacent edema/inflammation in the mesenteric fat. Prior LEFT hemicolectomy with a proximal descending colostomy in the LEFT mid abdomen. Expected stool burden in the remaining colon. Appendix not conspicuous. Vascular/Lymphatic: No visible aortoiliofemoral atherosclerosis. Widely patent visceral arteries. Normal-appearing portal venous and systemic venous systems. Note is made of a circumaortic LEFT renal vein. No pathologic lymphadenopathy. Reproductive: Prostate gland and seminal vesicles normal in size and appearance for age. Other: Presacral soft tissue thickening and stranding, presumably post-surgical scarring and prior radiation therapy (if performed), unchanged in appearance since the 05/22/2019 CT. No discernible  enhancing mass. 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 Incidental note of minimal BILATERAL gynecomastia. Musculoskeletal: Regional skeleton unremarkable without acute or significant osseous abnormality. IMPRESSION: 1. Mild wall thickening of several loops of jejunum and possibly proximal ileum in the LEFT mid abdomen and LEFT side of the pelvis, query mild enteritis/active Crohn's disease. No significant adjacent edema/inflammation in the mesenteric fat. 2. Presacral soft tissue thickening and stranding, presumably related to post-surgical scarring and  post radiation fibrosis (if the patient received prior radiation therapy), unchanged since the 05/22/2019 CT. 3. Descending colostomy in the LEFT mid abdomen. 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 4. Borderline to mild hepatosplenomegaly. Mildly irregular contour of the inferior RIGHT hepatic lobe, query early changes of cirrhosis. No focal hepatic parenchymal abnormality. 5. Cholelithiasis without evidence of acute cholecystitis. Electronically Signed   By: Evangeline Dakin M.D.   On: 08/25/2019 11:16   DG Chest Port 1 View  Result Date: 08/25/2019 CLINICAL DATA:  Rectal bleeding EXAM: PORTABLE CHEST 1 VIEW COMPARISON:  Aug 17, 2019 FINDINGS: Port-A-Cath tip is in the superior vena cava. No pneumothorax. Lungs are clear. Heart is upper normal in size with pulmonary vascularity normal. No adenopathy. No bone lesions. IMPRESSION: Port-A-Cath as described without pneumothorax. Lungs clear. Heart upper normal in size. Electronically Signed   By: Lowella Grip III M.D.   On: 08/25/2019 09:01    Procedures Procedures (including critical care time)  Medications Ordered in ED Medications  iohexol (OMNIPAQUE) 300 MG/ML solution 100 mL (100 mLs Intravenous Contrast Given 08/25/19 1044)    ED Course  I have reviewed the triage vital signs and the nursing notes.  Pertinent labs & imaging results  that were available during my care of the patient were reviewed by me and considered in my medical decision making (see chart for details).    MDM Rules/Calculators/A&P                      Juan Hester is a 39 year old male with a history of Crohn's and colon cancer status post chemoradiation & colectomy with colostomy and anal closure, PE on Xarelto and bipolar disorder who presents with rectal bleeding.   Pt w/ hx crohn's complicated by colon cancer s/p chemoradiation and colectomy w/ ostomy and anal closure here with rectal bleeding. No cancer records in our system. Of note he is on Xarelto for recent PE and recently began naproxen for acute lower back pain.  Given hx, recent imaging findings with soft tissue mass in presacral area, recent new lower back pain, rectal pressure and bleeding, will get CT Abdomen Pelvis W to evaluate for cancer recurrence vs surgical problems.   On exam, wound appears to be dehiscing with unclear etiology; may require surgery consult.  Bleeding could be secondary to combined use of Xarelto and naproxen although it wouldn't explain wound dehiscence. Will get CBC, CMP and PT/INR.  For chronic yet progressive SOB, will evaluate w/ EKG and chest x-ray to start. Patient is tachycardic and tachypnic. Unlikely a recurrent PE given pt is adherent to his Xarelto. No hx of CAD.    Final Clinical Impression(s) / ED Diagnoses Final diagnoses:  Rectal bleeding   CBC demonstrated normal Hgb and WBC.  CMP demonstrated worsening of his liver enzymes, previously noted to be elevated back in February without work up   EKG and chest x-ray unremarkable  CT Abdomen/Pelvis W demonstrated mild wall thickening of several loops of jejunum and possibly proximal ileum in the LEFT mid abdomen and LEFT side of the pelvis, concerning for mild enteritis/active Crohn's disease as well as borderline to mild hepatosplenomegaly, mildly irregular contour of the inferior RIGHT hepatic lobe,  concerning for early changes of cirrhosis. No focal hepatic parenchymal abnormality.  No concern for acute infection. HDS. Does not appear acutely sick. CT findings may be cause of current symptoms. Likely needs GI follow up outpatient. Will attempt to reach out to PCP.  Update: Unable to reach PCP via phone.  Have consulted GI for recommendations given imaging findings and unclear outpatient follow-up. They will come see him.  Of note, mother called stating patient threatened to kill her and himself back in March and needs to be committed. She endorsed having mental illness herself. Spoke to patient and he denies SI or HI and states he has a restraining order out against his mother and she is not supposed to have contact with him.   Will sign patient out to incoming provider.  Rx / DC Orders ED Discharge Orders    None       Al Decant, MD 08/25/19 9191    Blanchie Dessert, MD 08/26/19 1001

## 2019-08-25 NOTE — ED Provider Notes (Signed)
6:11 PM Patient awake, alert, speaking clearly. We reviewed findings, particularly consultation recommendations from our GI colleagues. Patient ready for, appropriate for discharge, with close outpatient follow-up in their office.   Carmin Muskrat, MD 08/25/19 [phone]

## 2019-08-25 NOTE — Consult Note (Signed)
[facility] Nurse ostomy consult note Stoma type/location: LLQ colostomy  Large abdominal hernia present.  Patient has gotten access to supplies and has 3 boxes of 2 piece flat pouches.  He refuses to try an ostomy belt. I explain the rationale with the hernia and that it could increase his wear time of one day.  He wants to stay in 2 piece.  He does agree to try a barrier ring.   Stomal assessment/size: oval:  2 cm x 3 cm moist and pink slightly budded Peristomal assessment: erythema and large abdominal hernia Treatment options for stomal/peristomal skin: barrier ring and 1 piece flexible flat pouch Output soft yellow stool Ostomy pouching: 1pc. Flat with barrier ring.  Education provided: refused belt  Agreed to barrier ring.  Applied warm pack to pouch after application.  Explained rationale to promote seal.  Enrolled patient in [place] program: Yes indigent program Will not follow at this time.  Please re-consult if needed.  Domenic Moras MSN, RN, FNP-BC [facility] Wound, Ostomy, Continence Nurse Pager [phone]

## 2019-08-25 NOTE — ED Triage Notes (Signed)
Per [facility] pt arrives ambulatory c/o rectal bleeding onset of 30 mins ago. Patient has colostomy bag x 2 years.

## 2019-08-25 NOTE — Consult Note (Addendum)
[facility] Consult: 2:33 PM 08/25/2019  LOS: 0 days    Referring Provider: Dr Maryan Rued in ED Primary Care Physician:  Juan Stain, MD Primary Gastroenterologist:  unassigned     Reason for Consultation: Rectal bleeding.  Crohn's disease, colon cancer.     HPI: Juan Hester is a 39 y.o. male.  Returned to [place] in late 2020.  No health insurance.. Crohn's disease of small and large bowel leading to surgery in 2005 in [place], says they removed half of his small intestine and half of his large intestine.  He took no meds for Crohns, chose to live in states where he could self treat w medical marijuana which controlled his symptoms.  While serving a prison sentence in 2018 through 2020, he was diagnosed with colon cancer.  Initially treated with chemo, then radiation then surgery.  01/2018 underwent surgery, ended up having what sounds like remnant colon removed, ostomy placement and anal closure.  Right after surgery he developed rectal bleeding and sepsis.  Patient was started on Remicade in February 2020, took this through October 2020 when he ran out of the medication and had no insurance or money to buy the Remicade on his own.  Has not had colonoscopy or any kind of endoscopy since the surgery. Elevated LFTs dating back to late January 2021  CTAP with contrast of 05/22/2019 showed partial colectomy with left lower quadrant colostomy in place, fat-containing parastomal hernia.  Presacral region ill-defined soft tissue, may be sequela of previous surgery.  Splenomegaly.  Cholelithiasis.  Normal-appearing liver.  LLL 3 mm pulmonary nodule.  4 cm ascending aortic aneurysm  Other issues include PE, on Xarelto.  Anxiety, bipolar disorder.  Back pain.  COVID-19 infection in 01/2019, not hospitalized and did not  require medications  About 3 days ago he [facility] after sustaining a work-related injury.  He was treated with surge prednisone of 40 mg a day for 4 days only, prescribed Naprosyn 500 mg bid and as needed tramadol.  He has been taking the Naprosyn but not the tramadol.  Previously was using Tylenol but that did not help the back pain. Additionally for a few weeks he has had lower extremity edema.  Generally poor appetite, forces himself to eat but no vomiting, no nausea.  Gained from 226 up to 273 #in the last 6 months. T bili 1.  Alk phos 140.  AST/ALT 136/180. Last week these were 0.6, 184, 110/136.    For about a week he has been having bleeding per rectum displayed as blood stains on his underwear.  Not large volume.  However this morning the bleeding accelerated and was dripping down his legs so he came to the ED.   Hgb 15.4.  Was 14.5 a week ago, and in late March.  In January and February Hb ranged from 16.5-15.6.  MCV 93. Platelets are 226, they were 116 in late March and 141 last week. INR 0.9 CTAP with contrast: Liver upper normal  to mildly enlarged in size.  Mildly irregular contour to the inferior right hepatic lobe.  Gallstones.  Pancreas difficult to visualize but as seen normal.  Borderline to mild splenomegaly.  Mild thickening in loops of jejunum and proximal ileum in the left abdomen/pelvis without adjacent edema inflammation.  Prior left hemicolectomy and proximal descending colostomy and left midabdomen.  Presacral soft tissue thickening and stranding presumably postsurgical scarring and/or due to previous radiation.  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  Minor bilateral gynecomastia.  Social history Works as a host at [employer].  About once a week he will consume a pint of 101 proof bourbon over the course of a few hours.  1 pack/day smoker.  Wife left the  patient and took the kids last year which was part of the reason he moved back to [place] where he has family.  Family history negative for colon cancer.       Past Medical History:  Diagnosis Date  . Crohn disease ([place])   . PE (pulmonary thromboembolism) ([place])   . Thyroid disease     Past Surgical History:  Procedure Laterality Date  . ABDOMINAL SURGERY    . COLON SURGERY    . SMALL INTESTINE SURGERY      Prior to Admission medications   Medication Sig Start Date End Date Taking? Authorizing Provider  clonazePAM (KLONOPIN) 0.5 MG tablet Take 0.5 mg by mouth 2 (two) times daily as needed for anxiety.   Yes [provider]  divalproex (DEPAKOTE) 500 MG DR tablet Take 1 tablet (500 mg total) by mouth 2 (two) times daily. 07/21/19  Yes Juan Stain, MD  DULoxetine (CYMBALTA) 60 MG capsule Take 1 capsule (60 mg total) by mouth every morning. 07/21/19  Yes Juan Stain, MD  methocarbamol (ROBAXIN) 500 MG tablet Take 1 tablet (500 mg total) by mouth every 6 (six) hours as needed for muscle spasms. 08/18/19  Yes Juan Stain, MD  naproxen (NAPROSYN) 500 MG tablet Take 1 tablet (500 mg total) by mouth 2 (two) times daily with a meal. 08/18/19  Yes Juan Stain, MD  OLANZapine (ZYPREXA) 20 MG tablet Take 1 tablet (20 mg total) by mouth at bedtime. 07/21/19  Yes Juan Stain, MD  rivaroxaban (XARELTO) 20 MG TABS tablet Take 1 tablet (20 mg total) by mouth daily with supper. 07/21/19  Yes Juan Stain, MD  simethicone (MYLICON) 80 MG chewable tablet Chew 80 mg by mouth every 6 (six) hours as needed for flatulence.   Yes [provider]  traMADol (ULTRAM) 50 MG tablet Take 50 mg by mouth every 6 (six) hours as needed for moderate pain.   Yes [provider]  furosemide (LASIX) 20 MG tablet Take 1 tablet (20 mg total) by mouth daily. 08/18/19   Juan Stain, MD  loperamide (IMODIUM) 2 MG capsule Take 1 capsule (2 mg total) by mouth as needed for  diarrhea or loose stools. Patient not taking: Reported on 08/25/2019 07/21/19   Juan Stain, MD    Scheduled Meds:  Infusions:  PRN Meds:    Allergies as of 08/25/2019 - Review Complete 08/25/2019  Allergen Reaction Noted  . Azathioprine Other (See Comments) 05/05/2019  . Ciprofloxacin Other (See Comments) 05/05/2019  . Metronidazole Other (See Comments) 05/05/2019  . Sulfa antibiotics Other (See Comments) 04/18/2019  . Barium-containing compounds Rash 07/20/2019    No family history on file.  Social History   Socioeconomic History  .  Marital status: Single    Spouse name: Not on file  . Number of children: Not on file  . Years of education: Not on file  . Highest education level: Not on file  Occupational History  . Not on file  Tobacco Use  . Smoking status: Current Every Day Smoker    Packs/day: 1.00  . Smokeless tobacco: Never Used  Substance and Sexual Activity  . Alcohol use: Yes  . Drug use: Not Currently  . Sexual activity: Yes  Other Topics Concern  . Not on file  Social History Narrative  . Not on file   Social Determinants of Health   Financial Resource Strain:   . Difficulty of Paying Living Expenses:   Food Insecurity:   . Worried About Charity fundraiser in the Last Year:   . Arboriculturist in the Last Year:   Transportation Needs:   . Film/video editor (Medical):   Marland Kitchen Lack of Transportation (Non-Medical):   Physical Activity:   . Days of Exercise per Week:   . Minutes of Exercise per Session:   Stress:   . Feeling of Stress :   Social Connections:   . Frequency of Communication with Friends and Family:   . Frequency of Social Gatherings with Friends and Family:   . Attends Religious Services:   . Active Member of Clubs or Organizations:   . Attends Archivist Meetings:   Marland Kitchen Marital Status:   Intimate Partner Violence:   . Fear of Current or Ex-Partner:   . Emotionally Abused:   Marland Kitchen Physically Abused:   . Sexually  Abused:     REVIEW OF SYSTEMS: Constitutional: Some weakness, no fatigue ENT:  No nose bleeds Pulm: Chronic cough. CV:  No palpitations, no LE edema.  GU:  No hematuria, no frequency GI: See HPI. Heme: Other than the rectal bleeding, no unusual or excessive bleeding Transfusions: None Neuro:  No headaches, no peripheral tingling or numbness.  No syncope, no seizures. Derm:  No itching, no rash or sores.  Endocrine:  No sweats or chills.  No polyuria or dysuria Immunization: Has not received vaccination for Covid. Travel:  None beyond local counties in last few months.    PHYSICAL EXAM: Vital signs in last 24 hours: Vitals:   08/25/19 1300 08/25/19 1330  BP: 125/84 (!) 130/105  Pulse: 93 88  Resp:    Temp:    SpO2: 96% 97%   Wt Readings from Last 3 Encounters:  08/25/19 122.5 kg  08/18/19 124.3 kg  07/21/19 119.8 kg    General: Obese, comfortable, alert Head: No facial asymmetry or swelling.  No signs of head trauma Eyes: No scleral icterus or conjunctival pallor. Ears: Not hard of hearing Nose: No congestion, no discharge Mouth: Some missing teeth.  Tongue midline.  Mucosa moist, pink, clear. Neck: No JVD, no masses, no thyromegaly Lungs: Clear bilaterally with quiet breathing, slight dry cough. Heart: RRR.  No MRG.  S1, S2 present Abdomen: Obese, somewhat tense.  Nontender.  Ostomy on the left with brown soft unformed stool.  Ostomy site beefy red healthy appearing.  Parastomal hernia.  Active bowel sounds.  Do not appreciate splenomegaly or hepatomegaly.  No bruits.   Rectal: The anus is stitched closed.  Linear tearing with slight blood at the anastomosis.  Rectum was inspected visually, not digitally Musc/Skeltl: No joint redness, swelling or gross deformity Extremities: No CCE. Neurologic: Oriented x3.  No asterixis, no limb weakness Skin: No  rashes, sores, telangiectasia Nodes: No cervical adenopathy Psych: Calm, cooperative, pleasant, fluid  speech  Intake/Output from previous day: No intake/output data recorded. Intake/Output this shift: No intake/output data recorded.  LAB RESULTS: Recent Labs    08/25/19 0820  WBC 8.7  HGB 15.4  HCT 46.2  PLT 226   BMET Lab Results  Component Value Date   NA 138 08/25/2019   NA 142 08/18/2019   NA 139 07/11/2019   K 3.7 08/25/2019   K 4.0 08/18/2019   K 3.6 07/11/2019   CL 101 08/25/2019   CL 103 08/18/2019   CL 102 07/11/2019   CO2 21 (L) 08/25/2019   CO2 22 08/18/2019   CO2 24 07/11/2019   GLUCOSE 121 (H) 08/25/2019   GLUCOSE 91 08/18/2019   GLUCOSE 122 (H) 07/11/2019   BUN 24 (H) 08/25/2019   BUN 14 08/18/2019   BUN 13 07/11/2019   CREATININE 1.21 08/25/2019   CREATININE 1.09 08/18/2019   CREATININE 1.18 07/11/2019   CALCIUM 8.7 (L) 08/25/2019   CALCIUM 8.9 08/18/2019   CALCIUM 8.4 (L) 07/11/2019   LFT Recent Labs    08/25/19 0820  PROT 7.1  ALBUMIN 3.4*  AST 136*  ALT 180*  ALKPHOS 140*  BILITOT 1.0   PT/INR Lab Results  Component Value Date   INR 0.9 08/25/2019   INR 1.0 06/06/2019   Hepatitis Panel No results for input(s): HEPBSAG, HCVAB, HEPAIGM, HEPBIGM in the last 72 hours. C-Diff No components found for: CDIFF Lipase  No results found for: LIPASE  Drugs of Abuse  No results found for: LABOPIA, COCAINSCRNUR, LABBENZ, AMPHETMU, THCU, LABBARB   RADIOLOGY STUDIES: CT ABDOMEN PELVIS W CONTRAST  Result Date: 08/25/2019 CLINICAL DATA:  39 year old with acute onset of rectal bleeding that began earlier this morning. Personal history of colon cancer for which the patient underwent colectomy and descending colostomy approximately 2 years ago. Current history of Crohn's disease. EXAM: CT ABDOMEN AND PELVIS WITH CONTRAST TECHNIQUE: Multidetector CT imaging of the abdomen and pelvis was performed using the standard protocol following bolus administration of intravenous contrast. CONTRAST:  150m OMNIPAQUE IOHEXOL 300 MG/ML IV. COMPARISON:   05/22/2019. FINDINGS: Lower chest: Respiratory motion blurred images of the lung bases. Small bleb or air cyst in the LEFT LOWER LOBE. Visualized lung bases clear. Heart size normal. Hepatobiliary: Liver upper normal in size to mildly enlarged. Mildly irregular contour of the inferior RIGHT hepatic lobe. No focal hepatic parenchymal abnormality. Contracted gallbladder containing gallstones. No pericholecystic edema or inflammation. No biliary ductal dilation. Pancreas: While difficult to visualize in its entirety, visualized portions normal in appearance. Spleen: Borderline to mild splenomegaly, the spleen measuring approximately 14.8 x 5.1 x 16.1 cm, yielding a volume of approximately 420 mL. No focal splenic parenchymal abnormality. Adrenals/Urinary Tract: Normal appearing adrenal glands. Kidneys normal in size and appearance without focal parenchymal abnormality. No hydronephrosis. No evidence of urinary tract calculi. Urinary bladder decompressed and tethered to anterior abdominal wall due to scar from the prior colectomy. Stomach/Bowel: Stomach normal in appearance for the degree of distention. Mild wall thickening of several loops of jejunum and possibly proximal ileum in the LEFT mid abdomen and LEFT side of the pelvis. No significant adjacent edema/inflammation in the mesenteric fat. Prior LEFT hemicolectomy with a proximal descending colostomy in the LEFT mid abdomen. Expected stool burden in the remaining colon. Appendix not conspicuous. Vascular/Lymphatic: No visible aortoiliofemoral atherosclerosis. Widely patent visceral arteries. Normal-appearing portal venous and systemic venous systems. Note is made of a circumaortic  LEFT renal vein. No pathologic lymphadenopathy. Reproductive: Prostate gland and seminal vesicles normal in size and appearance for age. Other: Presacral soft tissue thickening and stranding, presumably post-surgical scarring and prior radiation therapy (if performed), unchanged in  appearance since the 05/22/2019 CT. No discernible enhancing mass. 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 Incidental note of minimal BILATERAL gynecomastia. Musculoskeletal: Regional skeleton unremarkable without acute or significant osseous abnormality. IMPRESSION: 1. Mild wall thickening of several loops of jejunum and possibly proximal ileum in the LEFT mid abdomen and LEFT side of the pelvis, query mild enteritis/active Crohn's disease. No significant adjacent edema/inflammation in the mesenteric fat. 2. Presacral soft tissue thickening and stranding, presumably related to post-surgical scarring and post radiation fibrosis (if the patient received prior radiation therapy), unchanged since the 05/22/2019 CT. 3. Descending colostomy in the LEFT mid abdomen. 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 4. Borderline to mild hepatosplenomegaly. Mildly irregular contour of the inferior RIGHT hepatic lobe, query early changes of cirrhosis. No focal hepatic parenchymal abnormality. 5. Cholelithiasis without evidence of acute cholecystitis. Electronically Signed   By: Evangeline Dakin M.D.   On: 08/25/2019 11:16   DG Chest Port 1 View  Result Date: 08/25/2019 CLINICAL DATA:  Rectal bleeding EXAM: PORTABLE CHEST 1 VIEW COMPARISON:  Aug 17, 2019 FINDINGS: Port-A-Cath tip is in the superior vena cava. No pneumothorax. Lungs are clear. Heart is upper normal in size with pulmonary vascularity normal. No adenopathy. No bone lesions. IMPRESSION: Port-A-Cath as described without pneumothorax. Lungs clear. Heart upper normal in size. Electronically Signed   By: Lowella Grip III M.D.   On: 08/25/2019 09:01      IMPRESSION:   *   Rectal bleeding from linear, shallow ulcer at site of anal closure. CT scan today as well as in February show soft tissue thickening and stranding in the presacral region.  Patient has undergone previous radiation therapy.  *    Colon  cancer treated with chemo, then radiation, then colon resection, ostomy placement, anal closure in 2019.  *    Crohn's disease underwent what patient describes as small bowel and colonic resection 2005 in [place].  Not on any Crohn's meds until 05/2018 through 01/2019.  Lacked healthcare insurance and unable to afford to continue Remicade. Today's CT scan shows mild thickening in loops of jejunal and proximal ileum.  *   PE, on xarelto, ran out last week but unable to afford.      *     Elevated LFTs dating back to at least 04/2019. Today CTAP shows some irregularity to the inferior right hepatic lobe and borderline hepatomegaly as well as splenomegaly. Drinks a pint of high proof bourbon in 1 session about once a week  *   Nonobstructing parastomal hernia.  *    Multiple antibiotic allergies including ciprofloxacin, metronidazole, sulfa antibiotics.  Interestingly he is also allergic to azathioprine though he gives no history of having taken this medication, it possibly was used somewhere many years ago for his Crohn's disease    PLAN:     *   Topical therapy w zinc oxide and clean w NS bid to anal ulcer  *   Scope via ostomy?  Enteroscopy for eval of jejunal thickening??    Azucena Freed  08/25/2019, 2:33 PM Phone [phone]  Patient left ED for home just before I arrived to see him. So I am unable to give a full consult.  Based on chart review and  conversation with my PA, patient had perineal bleeding (had prior surgical oversew of anus, presumably with resection and permanent colostomy for malignancy). Local therapy with barrier cream recommended.  Not clear if active small bowel Crohn's, no oral contrast on CT that I personally reviewed.  Patient can be referred to our office for further management of Crohn's. Ongoing peri-anal bleeding at surgical site would require a general surgery or wound care consult.  Wilfrid Lund, MD [facility]

## 2019-08-26 ENCOUNTER — Telehealth: Payer: Self-pay | Admitting: Critical Care Medicine

## 2019-08-26 ENCOUNTER — Other Ambulatory Visit: Payer: Self-pay | Admitting: Critical Care Medicine

## 2019-08-26 DIAGNOSIS — I2694 Multiple subsegmental pulmonary emboli without acute cor pulmonale: Secondary | ICD-10-CM

## 2019-08-26 DIAGNOSIS — K50019 Crohn's disease of small intestine with unspecified complications: Secondary | ICD-10-CM

## 2019-08-26 DIAGNOSIS — K625 Hemorrhage of anus and rectum: Secondary | ICD-10-CM

## 2019-08-26 DIAGNOSIS — Z85038 Personal history of other malignant neoplasm of large intestine: Secondary | ICD-10-CM

## 2019-08-26 NOTE — Telephone Encounter (Signed)
Hi Alinda Sierras,   Do you need anything else to arrange this ASAP? Thank you!

## 2019-08-26 NOTE — Telephone Encounter (Signed)
I called this patient as he was in the emergency room yesterday with rectal bleeding.  Apparently where his rectum was sewn closed after he had his colectomy and area tore open with bleeding from this.  He is on the Naprosyn and Xarelto.  I am concerned about ongoing bleeding as he still bleeding this morning and I have asked him to stop his Naprosyn and Xarelto.  I been to have him assessed for potential inferior vena cava filter placement from radiology and he is encouraged to keep his follow-ups with gastroenterology which is scheduled for this week  CT scan of abdomen showed enteritis in the jejunum so he has active Crohn's disease in addition to the rectal bleeding

## 2019-08-27 ENCOUNTER — Telehealth: Payer: Self-pay | Admitting: Critical Care Medicine

## 2019-08-27 ENCOUNTER — Other Ambulatory Visit: Payer: Self-pay | Admitting: Critical Care Medicine

## 2019-08-27 DIAGNOSIS — K50019 Crohn's disease of small intestine with unspecified complications: Secondary | ICD-10-CM

## 2019-08-27 DIAGNOSIS — I2694 Multiple subsegmental pulmonary emboli without acute cor pulmonale: Secondary | ICD-10-CM

## 2019-08-27 DIAGNOSIS — K625 Hemorrhage of anus and rectum: Secondary | ICD-10-CM

## 2019-08-27 NOTE — Telephone Encounter (Signed)
I called the Pt to inform him th at is an Medicaid Pending alert on his Guarantor chart, that mean Pt has a balance over [phone] that [facility] want to Medicaid pay for it, I was trying to explain to the Pt what he need to do, he got upset and hang-up the phone, unable to finish the explanation and what he need to do

## 2019-08-27 NOTE — Telephone Encounter (Signed)
Mistake open encounter

## 2019-08-30 ENCOUNTER — Emergency Department (HOSPITAL_COMMUNITY)
Admission: EM | Admit: 2019-08-30 | Discharge: 2019-08-31 | Disposition: A | Discharge status: Home / Self Care | Payer: Self-pay | Attending: Emergency Medicine | Admitting: Emergency Medicine

## 2019-08-30 ENCOUNTER — Ambulatory Visit: Payer: Self-pay

## 2019-08-30 DIAGNOSIS — G8929 Other chronic pain: Secondary | ICD-10-CM

## 2019-08-30 DIAGNOSIS — M62830 Muscle spasm of back: Secondary | ICD-10-CM | POA: Insufficient documentation

## 2019-08-30 DIAGNOSIS — Z79899 Other long term (current) drug therapy: Secondary | ICD-10-CM | POA: Insufficient documentation

## 2019-08-30 DIAGNOSIS — M546 Pain in thoracic spine: Secondary | ICD-10-CM | POA: Insufficient documentation

## 2019-08-30 DIAGNOSIS — F17210 Nicotine dependence, cigarettes, uncomplicated: Secondary | ICD-10-CM | POA: Insufficient documentation

## 2019-08-30 DIAGNOSIS — Z85038 Personal history of other malignant neoplasm of large intestine: Secondary | ICD-10-CM | POA: Insufficient documentation

## 2019-08-31 ENCOUNTER — Encounter (HOSPITAL_COMMUNITY): Payer: Self-pay | Admitting: Emergency Medicine

## 2019-08-31 ENCOUNTER — Ambulatory Visit
Admission: RE | Admit: 2019-08-31 | Discharge: 2019-08-31 | Disposition: A | Discharge status: Home / Self Care | Payer: Self-pay | Source: Ambulatory Visit | Attending: Critical Care Medicine | Admitting: Critical Care Medicine

## 2019-08-31 ENCOUNTER — Other Ambulatory Visit: Payer: Self-pay

## 2019-08-31 DIAGNOSIS — I2694 Multiple subsegmental pulmonary emboli without acute cor pulmonale: Secondary | ICD-10-CM

## 2019-08-31 DIAGNOSIS — K625 Hemorrhage of anus and rectum: Secondary | ICD-10-CM

## 2019-08-31 DIAGNOSIS — K50019 Crohn's disease of small intestine with unspecified complications: Secondary | ICD-10-CM

## 2019-08-31 HISTORY — PX: IR RADIOLOGIST EVAL & MGMT: IMG5224

## 2019-08-31 LAB — COMPREHENSIVE METABOLIC PANEL
ALT: 104 U/L — ABNORMAL HIGH (ref 0–44)
AST: 63 U/L — ABNORMAL HIGH (ref 15–41)
Albumin: 3 g/dL — ABNORMAL LOW (ref 3.5–5.0)
Alkaline Phosphatase: 119 U/L (ref 38–126)
Anion gap: 12 (ref 5–15)
BUN: 21 mg/dL — ABNORMAL HIGH (ref 6–20)
CO2: 20 mmol/L — ABNORMAL LOW (ref 22–32)
Calcium: 8.4 mg/dL — ABNORMAL LOW (ref 8.9–10.3)
Chloride: 109 mmol/L (ref 98–111)
Creatinine, Ser: 1.05 mg/dL (ref 0.61–1.24)
GFR calc Af Amer: >60 mL/min (ref >60)
GFR calc non Af Amer: >60 mL/min (ref >60)
Glucose, Bld: 121 mg/dL — ABNORMAL HIGH (ref 70–99)
Potassium: 3.4 mmol/L — ABNORMAL LOW (ref 3.5–5.1)
Sodium: 141 mmol/L (ref 135–145)
Total Bilirubin: 0.5 mg/dL (ref 0.3–1.2)
Total Protein: 6.4 g/dL — ABNORMAL LOW (ref 6.5–8.1)

## 2019-08-31 LAB — CBC WITH DIFFERENTIAL/PLATELET
Abs Immature Granulocytes: 0.07 10*3/uL (ref 0.00–0.07)
Basophils Absolute: 0 10*3/uL (ref 0.0–0.1)
Basophils Relative: 1 %
Eosinophils Absolute: 0.1 10*3/uL (ref 0.0–0.5)
Eosinophils Relative: 2 %
HCT: 39.9 % (ref 39.0–52.0)
Hemoglobin: 13 g/dL (ref 13.0–17.0)
Immature Granulocytes: 1 %
Lymphocytes Relative: 11 %
Lymphs Abs: 0.7 10*3/uL (ref 0.7–4.0)
MCH: 30.9 pg (ref 26.0–34.0)
MCHC: 32.6 g/dL (ref 30.0–36.0)
MCV: 94.8 fL (ref 80.0–100.0)
Monocytes Absolute: 0.7 10*3/uL (ref 0.1–1.0)
Monocytes Relative: 11 %
Neutro Abs: 4.9 10*3/uL (ref 1.7–7.7)
Neutrophils Relative %: 74 %
Platelets: 107 10*3/uL — ABNORMAL LOW (ref 150–400)
RBC: 4.21 MIL/uL — ABNORMAL LOW (ref 4.22–5.81)
RDW: 16.1 % — ABNORMAL HIGH (ref 11.5–15.5)
WBC: 6.5 10*3/uL (ref 4.0–10.5)
nRBC: 0 % (ref 0.0–0.2)

## 2019-08-31 LAB — LACTIC ACID, PLASMA: Lactic Acid, Venous: 1.8 mmol/L (ref 0.5–1.9)

## 2019-08-31 MED ORDER — MORPHINE SULFATE (PF) 4 MG/ML IV SOLN
4.0000 mg | Freq: Once | INTRAVENOUS | Status: AC
  Administered 2019-08-31: 4 mg via INTRAVENOUS
  Filled 2019-08-31: qty 1

## 2019-08-31 NOTE — ED Notes (Signed)
Patient seen using the wheelchair to wheel himself to the street to smoke a cigarette. Patient advised to ask for help if it is necessary for him to get up but he should remain seated for his safety. Patient refuses to stay seated and has made multiple attempts to either get up or wheel himself outside and around the lobby. He was told if he injures himself he is doing it against medical advice because he was given instructions for his safety and he verbalized understanding.

## 2019-08-31 NOTE — ED Provider Notes (Signed)
[facility] Provider Note   CSN: [id number] Arrival date & time: 08/30/19  2353     History Chief Complaint  Patient presents with  . Back Pain    Juan Hester is a 39 y.o. male.  39 y.o male with a PMH of [place], PE, history of colon cancer presents to the ED with a chief complaint of back pain x 6 weeks. Patient reports pain to the lumbar spine, reports this has been ongoing for the past 6 weeks, he has been taking Robaxin, tramadol, steroid burst we will review bruising symptoms.  He was previously being treated by his PCP Dr. Asencion Noble.  He ports pain has become "excruciating ", states this is worse with ambulation, states he is unable to walk from more than a block.  He also endorses shortness of breath, states this has been ongoing, was diagnosed with a pulmonary embolism and placed on Xarelto, he discontinued this medication as he began to have rectal bleeding.  He does have a colostomy bag in place.  Denies any saddle anesthesias, does report left leg weakness.  No fevers, history of IV drug use, or previous history of cancer.  The history is provided by the patient and medical records.  Back Pain Associated symptoms: no abdominal pain, no fever and no headaches        Past Medical History:  Diagnosis Date  . Crohn disease ([place])   . PE (pulmonary thromboembolism) ([place])   . Thyroid disease     Patient Active Problem List   Diagnosis Date Noted  . Positive D dimer 08/19/2019  . Localized edema 08/18/2019  . Multiple subsegmental pulmonary emboli without acute cor pulmonale ([place]) 07/21/2019  . Bipolar 1 disorder ([place]) 07/21/2019  . Acute bilateral low back pain without sciatica 07/21/2019  . History of colon cancer 07/21/2019  . Crohn's disease with complication ([place]) 28/31/5176  . Colostomy, evaluate ([place]) 05/05/2019    Past Surgical History:  Procedure Laterality Date  . ABDOMINAL SURGERY    . COLON SURGERY    .  SMALL INTESTINE SURGERY         No family history on file.  Social History   Tobacco Use  . Smoking status: Current Every Day Smoker    Packs/day: 1.00  . Smokeless tobacco: Never Used  Substance Use Topics  . Alcohol use: Yes  . Drug use: Not Currently    Home Medications Prior to Admission medications   Medication Sig Start Date End Date Taking? Authorizing Provider  clonazePAM (KLONOPIN) 0.5 MG tablet Take 0.5 mg by mouth 2 (two) times daily as needed for anxiety.    [provider]  divalproex (DEPAKOTE) 500 MG DR tablet Take 1 tablet (500 mg total) by mouth 2 (two) times daily. 07/21/19   [name], MD  DULoxetine (CYMBALTA) 60 MG capsule Take 1 capsule (60 mg total) by mouth every morning. 07/21/19   [name], MD  furosemide (LASIX) 20 MG tablet Take 1 tablet (20 mg total) by mouth daily. 08/18/19   [name], MD  loperamide (IMODIUM) 2 MG capsule Take 1 capsule (2 mg total) by mouth as needed for diarrhea or loose stools. Patient not taking: Reported on 08/25/2019 07/21/19   [name], MD  methocarbamol (ROBAXIN) 500 MG tablet Take 1 tablet (500 mg total) by mouth every 6 (six) hours as needed for muscle spasms. 08/18/19   [name], MD  naproxen (NAPROSYN) 500 MG tablet  Take 1 tablet (500 mg total) by mouth 2 (two) times daily with a meal. 08/18/19   [name], MD  OLANZapine (ZYPREXA) 20 MG tablet Take 1 tablet (20 mg total) by mouth at bedtime. 07/21/19   [name], MD  rivaroxaban (XARELTO) 20 MG TABS tablet Take 1 tablet (20 mg total) by mouth daily with supper. 07/21/19   [name], MD  simethicone (MYLICON) 80 MG chewable tablet Chew 80 mg by mouth every 6 (six) hours as needed for flatulence.    [provider]  traMADol (ULTRAM) 50 MG tablet Take 50 mg by mouth every 6 (six) hours as needed for moderate pain.    [provider]  zinc oxide (BALMEX) 11.3 % CREA cream Apply 1 application  topically 2 (two) times daily. 08/25/19   Carmin Muskrat, MD    Allergies    Azathioprine, Ciprofloxacin, Metronidazole, Sulfa antibiotics, and Barium-containing compounds  Review of Systems   Review of Systems  Constitutional: Negative for fever.  HENT: Negative for sore throat.   Respiratory: Positive for shortness of breath.   Gastrointestinal: Negative for abdominal pain, nausea and vomiting.  Genitourinary: Negative for flank pain.  Musculoskeletal: Positive for back pain and gait problem.  Skin: Negative for pallor and wound.  Neurological: Negative for light-headedness and headaches.    Physical Exam Updated Vital Signs BP 134/75 (BP Location: Right Arm)   Pulse (!) 137   Temp 98.9 F (37.2 C) (Oral)   Resp 16   SpO2 95%   Physical Exam Vitals and nursing note reviewed.  Constitutional:      Appearance: Normal appearance. He is not ill-appearing or toxic-appearing.  HENT:     Head: Normocephalic and atraumatic.  Eyes:     Pupils: Pupils are equal, round, and reactive to light.  Cardiovascular:     Rate and Rhythm: Tachycardia present.  Pulmonary:     Effort: Pulmonary effort is normal.     Breath sounds: No wheezing or rales.  Abdominal:     General: Abdomen is flat.     Tenderness: There is no right CVA tenderness or left CVA tenderness.  Musculoskeletal:        General: Tenderness present.     Cervical back: Normal range of motion and neck supple.     Lumbar back: Spasms and tenderness present. No swelling, edema, deformity or lacerations. Normal range of motion.       Legs:  Neurological:     Mental Status: He is alert and oriented to person, place, and time.     Comments: RLE- KF,KE 5/5 strength LLE- HF, HE 5/5 strength Normal gait. No pronator drift. No leg drop.   CN I, II and VIII not tested. CN II-XII grossly intact bilaterally.        ED Results / Procedures / Treatments   Labs (all labs ordered are listed, but only abnormal results are  displayed) Labs Reviewed  CBC WITH DIFFERENTIAL/PLATELET - Abnormal; Notable for the following components:      Result Value   RBC 4.21 (*)    RDW 16.1 (*)    Platelets 107 (*)    All other components within normal limits  COMPREHENSIVE METABOLIC PANEL - Abnormal; Notable for the following components:   Potassium 3.4 (*)    CO2 20 (*)    Glucose, Bld 121 (*)    BUN 21 (*)    Calcium 8.4 (*)    Total Protein 6.4 (*)  Albumin 3.0 (*)    AST 63 (*)    ALT 104 (*)    All other components within normal limits  LACTIC ACID, PLASMA  LACTIC ACID, PLASMA    EKG EKG Interpretation  Date/Time:  Monday Aug 30 2019 23:59:47 EDT Ventricular Rate:  130 PR Interval:  146 QRS Duration: 74 QT Interval:  296 QTC Calculation: 435 R Axis:   -13 Text Interpretation: Sinus tachycardia Low voltage QRS Borderline ECG Confirmed by Thayer Jew [phone]) on 08/31/2019 5:29:55 AM   Radiology No results found.  Procedures Procedures (including critical care time)  Medications Ordered in ED Medications  morphine 4 MG/ML injection 4 mg (has no administration in time range)    ED Course  I have reviewed the triage vital signs and the nursing notes.  Pertinent labs & imaging results that were available during my care of the patient were reviewed by me and considered in my medical decision making (see chart for details).    MDM Rules/Calculators/A&P   Patient with extensive PMH presents to the ED with a chief complaint of thoracic back pain for the past 6 weeks. Patient reports he has been treated by his PCP with robaxin, ultram and steroid pack without improvement in symptoms.Reports pain is worsen with ambulation, states he "cannot walk". However, patient was seen ambulating around the ED with a steady gate. \  According to EMS report, patient was drinking a red bull along with smoking cigarettes when EMS arrived therefore arrived in the ED tachycardic with a heart in the 130s, he was  evaluated by me with a heart rate in the 115's.  No hypoxia noted, no tachypnea.  Does have pain in the thoracic spine, reports leg weakness however neurological wise he is intact.  And has been ambulatory in the ED.  Last CT L-spine that he obtained in February did not showed:  1. Questionable acute pulmonary embolus involving a segmental branch of the left lower lobe. Evaluation for pulmonary emboli is significantly limited by technique and contrast timing. 2. Ascending thoracic aortic aneurysm currently measures approximately 4 cm. Recommend annual imaging followup by CTA or MRA. This recommendation follows 2010 ACCF/AHA/AATS/ACR/ASA/SCA/SCAI/SIR/STS/SVM Guidelines for the Diagnosis and Management of Patients with Thoracic Aortic Disease. Circulation. 2010; 121: U272-Z366. Aortic aneurysm NOS (ICD10-I71.9) 3. 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 There is a fat containing peristomal hernia without evidence for obstruction. There is no evidence for active Crohn's disease on this exam. 4. Ill-defined soft tissue in the presacral region may represent post treatment changes from prior partial colectomy. There are no prior studies to compare to. Comparison to outside prior studies would be useful to confirm stability of this finding. 5. Splenomegaly. 6. Cholelithiasis without evidence for acute cholecystitis. 7. No acute fracture involving the thoracic or lumbar spine. 8. There is a 3 mm pulmonary nodule in the left lower lobe. No follow-up needed if patient is low-risk. Non-contrast chest CT can be considered in 12 months if patient is high-risk. This recommendation follows the consensus statement: Guidelines for Management of Incidental Pulmonary Nodules Detected on CT Images: From the Fleischner Society 2017; Radiology 2017; 284:228-243. 9. Well-positioned left subclavian Port-A-Cath.  Patient has discontinued his Xarelto therapy as he was  having some rectal bleeding.  He continues to smoke cigarettes along with noncompliant with medication which is likely not improving his pulmonary embolism diagnoses.  Addition of his labs showed CBC without any leukocytosis, hemoglobin is within normal limits.  CMP  with some mild hypokalemia, creatinine level is within normal limits.  LFTs are elevated but stable his previous visits.HR has decreased to 100, he is receiving pain medication for his back pain with improvement.  He does not have any fever, prior history of IV drug use or saddle anesthesia lower suspicion for abscess, or spine component. Suspect ongoing pain is likely MSK. He is being provided with pain control by his PCP, we discussed follow up with him. He reports having an appointment with him today. Return precautions discussed at length, patient provided with a replacement for his colostomy bag.    Portions of this note were generated with [employer]. Dictation errors may occur despite best attempts at proofreading.  Final Clinical Impression(s) / ED Diagnoses Final diagnoses:  Chronic bilateral thoracic back pain    Rx / DC Orders ED Discharge Orders    None       Janeece Fitting, PA-C 08/31/19 2409    Merryl Hacker, MD 08/31/19 [phone]

## 2019-08-31 NOTE — Consult Note (Signed)
Chief Complaint: Patient was consulted remotely today (TeleHealth) for IVC filter consult   Referring Physician(s): Asencion Noble, MD  History of Present Illness: Juan Hester is a 39 y.o. male with a complex medical history including Crohn's disease, colon cancer and pulmonary embolism.  Patient was diagnosed with small bilateral pulmonary emboli on a chest CT from 05/22/2019.  Patient was being treated with Xarelto.  Patient recently developed anal or perineal bleeding probably related to his previous surgical site.  The patient has a permanent colostomy.  Patient stopped taking the Xarelto due to the perineal bleeding and was referred to interventional radiology for IVC filter placement.  Recently, the patient has been complaining of the significant back pain and he was just in the emergency department this morning.  He says that his legs are giving out due to the back pain.  Patient is complaining of lower extremity edema but has not had a recent lower extremity venous duplex.  Patient's medical situation is also complicated by financial limitations.  Patient tells me that he had an MRI today while in the emergency department but I can not find any record of that MRI study and I called the MRI suite at [facility] and they did not have record of an MRI from earlier today.  Past Medical History:  Diagnosis Date  . Crohn disease ([place])   . PE (pulmonary thromboembolism) ([place])   . Thyroid disease     Past Surgical History:  Procedure Laterality Date  . ABDOMINAL SURGERY    . COLON SURGERY    . SMALL INTESTINE SURGERY      Allergies: Azathioprine, Ciprofloxacin, Metronidazole, Sulfa antibiotics, and Barium-containing compounds  Medications: Prior to Admission medications   Medication Sig Start Date End Date Taking? Authorizing Provider  clonazePAM (KLONOPIN) 0.5 MG tablet Take 0.5 mg by mouth 2 (two) times daily as needed for anxiety.    [provider]  divalproex  (DEPAKOTE) 500 MG DR tablet Take 1 tablet (500 mg total) by mouth 2 (two) times daily. 07/21/19   [name], MD  DULoxetine (CYMBALTA) 60 MG capsule Take 1 capsule (60 mg total) by mouth every morning. 07/21/19   [name], MD  furosemide (LASIX) 20 MG tablet Take 1 tablet (20 mg total) by mouth daily. 08/18/19   [name], MD  loperamide (IMODIUM) 2 MG capsule Take 1 capsule (2 mg total) by mouth as needed for diarrhea or loose stools. Patient not taking: Reported on 08/25/2019 07/21/19   [name], MD  methocarbamol (ROBAXIN) 500 MG tablet Take 1 tablet (500 mg total) by mouth every 6 (six) hours as needed for muscle spasms. 08/18/19   [name], MD  naproxen (NAPROSYN) 500 MG tablet Take 1 tablet (500 mg total) by mouth 2 (two) times daily with a meal. 08/18/19   [name], MD  OLANZapine (ZYPREXA) 20 MG tablet Take 1 tablet (20 mg total) by mouth at bedtime. 07/21/19   [name], MD  rivaroxaban (XARELTO) 20 MG TABS tablet Take 1 tablet (20 mg total) by mouth daily with supper. 07/21/19   [name], MD  simethicone (MYLICON) 80 MG chewable tablet Chew 80 mg by mouth every 6 (six) hours as needed for flatulence.    [provider]  traMADol (ULTRAM) 50 MG tablet Take 50 mg by mouth every 6 (six) hours as needed for moderate pain.    [provider]  zinc oxide (BALMEX) 11.3 %  CREA cream Apply 1 application topically 2 (two) times daily. 08/25/19   Carmin Muskrat, MD     No family history on file.  Social History   Socioeconomic History  . Marital status: Single    Spouse name: Not on file  . Number of children: Not on file  . Years of education: Not on file  . Highest education level: Not on file  Occupational History  . Not on file  Tobacco Use  . Smoking status: Current Every Day Smoker    Packs/day: 1.00  . Smokeless tobacco: Never Used  Substance and Sexual Activity  . Alcohol use: Yes  . Drug use: Not Currently   . Sexual activity: Yes  Other Topics Concern  . Not on file  Social History Narrative  . Not on file   Social Determinants of Health   Financial Resource Strain:   . Difficulty of Paying Living Expenses:   Food Insecurity:   . Worried About Charity fundraiser in the Last Year:   . Arboriculturist in the Last Year:   Transportation Needs:   . Film/video editor (Medical):   Marland Kitchen Lack of Transportation (Non-Medical):   Physical Activity:   . Days of Exercise per Week:   . Minutes of Exercise per Session:   Stress:   . Feeling of Stress :   Social Connections:   . Frequency of Communication with Friends and Family:   . Frequency of Social Gatherings with Friends and Family:   . Attends Religious Services:   . Active Member of Clubs or Organizations:   . Attends Archivist Meetings:   Marland Kitchen Marital Status:      Review of Systems  Gastrointestinal: Positive for anal bleeding.  Musculoskeletal: Positive for back pain.     Physical Exam No direct physical exam was performed   Imaging: DG Chest 2 View  Result Date: 08/17/2019 CLINICAL DATA:  Peripheral edema. EXAM: CHEST - 2 VIEW COMPARISON:  07/11/2019 and 06/06/2019 FINDINGS: Port-A-Cath in place with the tip in the superior vena cava just below the carina in good position. The heart size and pulmonary vascularity normal. The lungs are clear. No bone abnormality. No effusions. IMPRESSION: No acute abnormalities. Electronically Signed   By: Lorriane Shire M.D.   On: 08/17/2019 08:05   DG Lumbar Spine Complete  Result Date: 08/18/2019 CLINICAL DATA:  Fall, acute back pain EXAM: LUMBAR SPINE - COMPLETE 4+ VIEW COMPARISON:  CT lumbar spine, 3 6,021 FINDINGS: There is no evidence of lumbar spine fracture. Alignment is normal. Intervertebral disc spaces are maintained. IMPRESSION: No fracture or dislocation of the lumbar spine. Disc spaces and vertebral body heights are preserved. Electronically Signed   By: Eddie Candle  M.D.   On: 08/18/2019 20:54   CT ABDOMEN PELVIS W CONTRAST  Result Date: 08/25/2019 CLINICAL DATA:  39 year old with acute onset of rectal bleeding that began earlier this morning. Personal history of colon cancer for which the patient underwent colectomy and descending colostomy approximately 2 years ago. Current history of Crohn's disease. EXAM: CT ABDOMEN AND PELVIS WITH CONTRAST TECHNIQUE: Multidetector CT imaging of the abdomen and pelvis was performed using the standard protocol following bolus administration of intravenous contrast. CONTRAST:  145m OMNIPAQUE IOHEXOL 300 MG/ML IV. COMPARISON:  05/22/2019. FINDINGS: Lower chest: Respiratory motion blurred images of the lung bases. Small bleb or air cyst in the LEFT LOWER LOBE. Visualized lung bases clear. Heart size normal. Hepatobiliary: Liver upper normal in size  to mildly enlarged. Mildly irregular contour of the inferior RIGHT hepatic lobe. No focal hepatic parenchymal abnormality. Contracted gallbladder containing gallstones. No pericholecystic edema or inflammation. No biliary ductal dilation. Pancreas: While difficult to visualize in its entirety, visualized portions normal in appearance. Spleen: Borderline to mild splenomegaly, the spleen measuring approximately 14.8 x 5.1 x 16.1 cm, yielding a volume of approximately 420 mL. No focal splenic parenchymal abnormality. Adrenals/Urinary Tract: Normal appearing adrenal glands. Kidneys normal in size and appearance without focal parenchymal abnormality. No hydronephrosis. No evidence of urinary tract calculi. Urinary bladder decompressed and tethered to anterior abdominal wall due to scar from the prior colectomy. Stomach/Bowel: Stomach normal in appearance for the degree of distention. Mild wall thickening of several loops of jejunum and possibly proximal ileum in the LEFT mid abdomen and LEFT side of the pelvis. No significant adjacent edema/inflammation in the mesenteric fat. Prior LEFT hemicolectomy  with a proximal descending colostomy in the LEFT mid abdomen. Expected stool burden in the remaining colon. Appendix not conspicuous. Vascular/Lymphatic: No visible aortoiliofemoral atherosclerosis. Widely patent visceral arteries. Normal-appearing portal venous and systemic venous systems. Note is made of a circumaortic LEFT renal vein. No pathologic lymphadenopathy. Reproductive: Prostate gland and seminal vesicles normal in size and appearance for age. Other: Presacral soft tissue thickening and stranding, presumably post-surgical scarring and prior radiation therapy (if performed), unchanged in appearance since the 05/22/2019 CT. No discernible enhancing mass. 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 Incidental note of minimal BILATERAL gynecomastia. Musculoskeletal: Regional skeleton unremarkable without acute or significant osseous abnormality. IMPRESSION: 1. Mild wall thickening of several loops of jejunum and possibly proximal ileum in the LEFT mid abdomen and LEFT side of the pelvis, query mild enteritis/active Crohn's disease. No significant adjacent edema/inflammation in the mesenteric fat. 2. Presacral soft tissue thickening and stranding, presumably related to post-surgical scarring and post radiation fibrosis (if the patient received prior radiation therapy), unchanged since the 05/22/2019 CT. 3. Descending colostomy in the LEFT mid abdomen. 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 4. Borderline to mild hepatosplenomegaly. Mildly irregular contour of the inferior RIGHT hepatic lobe, query early changes of cirrhosis. No focal hepatic parenchymal abnormality. 5. Cholelithiasis without evidence of acute cholecystitis. Electronically Signed   By: Evangeline Dakin M.D.   On: 08/25/2019 11:16   DG Chest Port 1 View  Result Date: 08/25/2019 CLINICAL DATA:  Rectal bleeding EXAM: PORTABLE CHEST 1 VIEW COMPARISON:  Aug 17, 2019 FINDINGS: Port-A-Cath tip  is in the superior vena cava. No pneumothorax. Lungs are clear. Heart is upper normal in size with pulmonary vascularity normal. No adenopathy. No bone lesions. IMPRESSION: Port-A-Cath as described without pneumothorax. Lungs clear. Heart upper normal in size. Electronically Signed   By: Lowella Grip III M.D.   On: 08/25/2019 09:01    Labs:  CBC: Recent Labs    07/11/19 1000 08/18/19 1153 08/25/19 0820 08/31/19 0522  WBC 8.3 7.6 8.7 6.5  HGB 14.5 14.5 15.4 13.0  HCT 41.8 42.2 46.2 39.9  PLT 116* 141* 226 107*    COAGS: Recent Labs    06/06/19 2049 08/25/19 0820  INR 1.0 0.9    BMP: Recent Labs    07/11/19 1000 08/18/19 1153 08/25/19 0820 08/31/19 0522  NA 139 142 138 141  K 3.6 4.0 3.7 3.4*  CL 102 103 101 109  CO2 24 22 21* 20*  GLUCOSE 122* 91 121* 121*  BUN 13 14 24* 21*  CALCIUM 8.4* 8.9 8.7* 8.4*  CREATININE 1.18 1.09 1.21 1.05  GFRNONAA >60 85 >60 >60  GFRAA >60 98 >60 >60    LIVER FUNCTION TESTS: Recent Labs    05/22/19 1732 08/18/19 1153 08/25/19 0820 08/31/19 0522  BILITOT 0.8 0.6 1.0 0.5  AST 47* 110* 136* 63*  ALT 58* 136* 180* 104*  ALKPHOS 161* 184* 140* 119  PROT 7.5 6.8 7.1 6.4*  ALBUMIN 3.6 3.9* 3.4* 3.0*    TUMOR MARKERS: No results for input(s): AFPTM, CEA, CA199, CHROMGRNA in the last 8760 hours.  Assessment and Plan:  39 year old with history of Crohn's, colon cancer and diagnosed with small bilateral pulmonary emboli in February 2021.  Patient was on Xarelto for the pulmonary emboli but recently developed anal or perineal bleeding.  The patient has stopped the Xarelto due to the bleeding.  Patient says that the bleeding has stopped since he has been off the Xarelto.  However, the patient needs pulmonary embolism prophylaxis if he is going to stay off the Xarelto.  Patient was referred for an IVC filter placement.  I had a long discussion with the patient on the telephone about blood clots, pulmonary embolism, anticoagulation  and IVC filter.  I believe the patient has a good understanding of why he would need an IVC filter if he cannot be on anticoagulation.  We discussed the IVC filter procedure in depth.  I explained that we would place the filter through his neck or groin.  Explained that the filter is retrievable and would want to retrieve the filter in the future.  At this point, there are some unanswered questions.  Patient is scheduled to see Dr. Joya Gaskins tomorrow and see GI on Thursday.  Not clear how long the patient would need anticoagulation because there appears to be unanswered questions about his cancer status.  Also, not clear if the patient could restart his Xarelto at this time now that the bleeding has stopped.  In addition, the patient is complaining of leg swelling and severe back pain.  It would be useful if the patient can get a bilateral lower extremity venous duplex.  Patient says that he has been told that the blood clots are enlarging but I do not see any evidence of that based on his medical records.  In fact, the most recent CTA of the chest from 07/03/2019 did not show any evidence of acute pulmonary embolism.  If the patient needs pulmonary embolism prophylaxis and cannot restart his anticoagulation, we will schedule him for an IVC filter placement.  Patient is very concerned about the finances of this procedure and we will have to work with the hospital in order to figure that out.  In addition, the patient does not have anyone that can give him a ride to and from the hospital on the day of the procedure which is another obstacle we would have to overcome if we proceed with the procedure.  Thank you for this interesting consult.  I greatly enjoyed meeting Juan Hester and look forward to participating in their care.  A copy of this report was sent to the requesting provider on this date.  Electronically Signed: Burman Riis 08/31/2019, 3:45 PM   I spent a total of  30 Minutes   in remote  clinical  consultation, greater than 50% of which was counseling/coordinating care for pulmonary embolism management.    Visit type: Audio only (telephone). Audio (no video) only due to patient's lack of internet/smartphone capability. Alternative for in-person consultation at [facility], [address], [place], [place]. This visit type was conducted due to national recommendations for restrictions regarding the COVID-19 Pandemic (e.g. social distancing).  This format is felt to be most appropriate for this patient at this time.  All issues noted in this document were discussed and addressed.

## 2019-08-31 NOTE — ED Notes (Signed)
Patient seen being wheeled to the curb for a cigarette by another patient waiting for triage.

## 2019-08-31 NOTE — Discharge Instructions (Signed)
Your laboratory results were within normal limits today.  Please schedule an appt with your PCP, in order to obtain further management of your recurrent thoracic back pain.

## 2019-08-31 NOTE — ED Notes (Signed)
Patient states if he has to wait close to nine hours he will have to wheel himself to the curb for a few more cigarettes. Reminded patient that if he hurts himself in the wheelchair he would be responsible since he is acting against medical advice. Continues to wheel himself to the vending machine and around the lobby. Colostomy bag leaking through the paper scrubs.

## 2019-08-31 NOTE — ED Triage Notes (Signed)
Pt picked up for gas station, c/o of back pain.  Was drinking a redbull and smoking a cigarette when EMS arrived, they attributed that to his pulse of 125.  Also needs a colostomy bag.

## 2019-08-31 NOTE — ED Notes (Signed)
Patient given supplies to change colostomy bag. Patient also given clean scrubs. Patient stated the new bag didn't fit. Updated secretary to order different supplies for patient. The patient took the old bag off without having a replacement.

## 2019-08-31 NOTE — ED Notes (Signed)
Discharge instructions discussed with pt. Pt verbalized understanding. Pt stable and ambulatory. No signature pad available. 

## 2019-09-01 ENCOUNTER — Other Ambulatory Visit: Payer: Self-pay

## 2019-09-01 ENCOUNTER — Encounter: Payer: Self-pay | Admitting: Critical Care Medicine

## 2019-09-01 ENCOUNTER — Ambulatory Visit: Payer: Self-pay | Attending: Critical Care Medicine | Admitting: Critical Care Medicine

## 2019-09-01 VITALS — BP 127/82 | HR 107 | Temp 98.3°F | Resp 18 | Ht 73.0 in | Wt 273.0 lb

## 2019-09-01 DIAGNOSIS — F319 Bipolar disorder, unspecified: Secondary | ICD-10-CM

## 2019-09-01 DIAGNOSIS — I2694 Multiple subsegmental pulmonary emboli without acute cor pulmonale: Secondary | ICD-10-CM

## 2019-09-01 DIAGNOSIS — M546 Pain in thoracic spine: Secondary | ICD-10-CM

## 2019-09-01 DIAGNOSIS — M545 Low back pain, unspecified: Secondary | ICD-10-CM

## 2019-09-01 DIAGNOSIS — K50919 Crohn's disease, unspecified, with unspecified complications: Secondary | ICD-10-CM

## 2019-09-01 MED ORDER — ONDANSETRON HCL 4 MG PO TABS: 4.0000 mg | ORAL_TABLET | Freq: Three times a day (TID) | ORAL | 0 refills | Status: DC | PRN

## 2019-09-01 MED ORDER — AMOXICILLIN-POT CLAVULANATE 875-125 MG PO TABS
1.0000 | ORAL_TABLET | Freq: Two times a day (BID) | ORAL | 0 refills | Status: DC
Start: 2019-09-01 — End: 2019-10-16

## 2019-09-01 MED ORDER — OXYCODONE HCL 15 MG PO TABS: 15.0000 mg | ORAL_TABLET | ORAL | 0 refills | Status: DC | PRN

## 2019-09-01 NOTE — Assessment & Plan Note (Signed)
Bipolar disorder currently on Zyprexa and Depakote stable at this time

## 2019-09-01 NOTE — Assessment & Plan Note (Signed)
We will continue to hold Xarelto and study both lower extremities with vascular Doppler ultrasounds and we will hold off placing inferior vena cava filter for now

## 2019-09-01 NOTE — Progress Notes (Signed)
Subjective:    Patient ID: Juan Hester, male    DOB: 07-Apr-1981, 39 y.o.   MRN: [id number]  History of Present Illness:   This is a complex 39 year old male who just arrived here from the [place] area and currently is living with relatives who presents for evaluation of pulmonary embolism history, Crohn's disease, history of colon carcinoma status post sigmoid resection with colostomy formation, and need for paperwork regarding patient's work status.  The patient currently is living in a hotel.  Patient arrived here in December.  The patient in [place] was under care by gastroenterologist was on Remicade for Crohn's disease and also being followed for his oncology status.  He is due for a colonoscopy at this time.  He works at [employer] he has history of bipolar disorder and had previously been on Cogentin Zyprexa, Depakote and Cymbalta.  Patient is also had major depression as well in the past.  The patient states he has significant volume output from his colostomy with a diarrheal type syndrome and he does have Imodium for this.  The patient has abdominal pain at times.  He has chronic fatigue.  He cannot stand and one place for prolonged periods of time.  He fell at work and his lower back has been difficult for him since that fall.  He was in the emergency room and underwent CT scan of the head and this was negative.  The patient is needing refills on his medications and he does not have insurance at this time.  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  The patient states his back pain is worsening.  He did fall at work and March.  He states the pain was tolerable but now markedly worse if he  stands for long periods of time.  He was not able to use a Lidoderm patch as he cannot reach the center part of his back.  He notes pain shooting down both legs.  He has some difficulty walking as well at this time.  There has not been any lumbar imaging studies done although when he went to the emergency room previously there was a CT scan of the abdomen was done and did not show changes in the lumbar spine but this was a limited study  Patient states his cyclobenzaprine is not useful but he is not taking it on a regular basis The patient has no change in his breathing status and is maintaining his anticoagulant for history of pulmonary emboli.  The patient is yet to achieve a gastroenterology appointment as he needs the orange card first.  He has his financial paperwork together so make an appointment for the financial counselor  08/18/2019 The patient comes in with lower extremity edema and is just gone to the ER 1 day ago for progressive back pain which occurred on the job.  He has since been let go from the job he was working.  He has another job as well he works.  The patient is yet to receive his lumbar x-rays as ordered.  He does not have financial assistance as of yet to be able to achieve an MRI or orthopedics appointment.  Patient  notes increased edema in the lower extremities and some shortness of breath with exertion. Note I reviewed his CT scan of the chest March which did not show pulmonary emboli but did show atelectasis at the bases of the lungs.   Wt Readings from Last 3 Encounters: 08/18/19 : 274 lb (124.3 kg) 07/21/19 : 264 lb 3.2 oz (119.8 kg) 07/11/19 : 260 lb (117.9 kg)  Patient cannot sleep flat is still smoking half pack a day of cigarettes denies wheezing slight productive cough of green mucus no sinus complaints  09/01/2019 This patient is seen in return follow-up and has been to the emergency room twice since I last saw him several weeks ago.  Patient has continued pain  in the lower back.  He has a normal lumbar study.  He had fallen at work.  He is now been fired from his job.  He has retained an attorney for [employer].  The patient states his pain is more in the upper thoracic area as well as in the lumbar area at this time.  Note he also went to interventional radiology they recommend not starting in this patient with an in fear vena cava filter but instead study his lower extremities first.  He is yet to have a vascular study the lower extremities for DVT.  He is now off Xarelto because he was bleeding from an opening of the rectal closure from his previous total colectomy.  The patient still has some difficulty in that area but the bleeding is less off the Xarelto.  Patient is also seeing gastroenterology and they are awaiting further evaluation of the rectal tear.  The patient is yet to see a surgeon for this.  He is here today for follow-up.   Back Pain Pertinent negatives include no abdominal pain or chest pain.   Past Medical History:  Diagnosis Date  . Crohn disease ([place])   . PE (pulmonary thromboembolism) ([place])   . Thyroid disease      No family history on file.   Social History   Socioeconomic History  . Marital status: Single    Spouse name: Not on file  . Number of children: Not on file  . Years of education: Not on file  . Highest education level: Not on file  Occupational History  . Not on file  Tobacco Use  . Smoking status: Current Every Day Smoker    Packs/day: 1.00  . Smokeless tobacco: Never Used  Substance and Sexual Activity  . Alcohol use: Yes  . Drug use: Not Currently  . Sexual activity: Yes  Other Topics Concern  . Not on file  Social History Narrative  . Not on file   Social Determinants of Health   Financial Resource Strain:   . Difficulty of Paying Living Expenses:   Food Insecurity:   . Worried About Charity fundraiser in the Last Year:   . Arboriculturist in the Last Year:     Transportation Needs:   . Film/video editor (Medical):   Marland Kitchen Lack of Transportation (Non-Medical):   Physical Activity:   . Days of Exercise per Week:   . Minutes of Exercise per Session:   Stress:   . Feeling of Stress :   Social Connections:   . Frequency of Communication with Friends and Family:   . Frequency of Social Gatherings with Friends and Family:   . Attends Religious Services:   . Active Member of Clubs or Organizations:   .  Attends Archivist Meetings:   Marland Kitchen Marital Status:   Intimate Partner Violence:   . Fear of Current or Ex-Partner:   . Emotionally Abused:   Marland Kitchen Physically Abused:   . Sexually Abused:      Allergies  Allergen Reactions  . Azathioprine Other (See Comments)    Pt does not recall reaction   . Ciprofloxacin Other (See Comments)    Pt does not recall reaction   . Metronidazole Other (See Comments)    Pt does not recall reaction   . Sulfa Antibiotics Other (See Comments)    Pt does not recall reaction   . Barium-Containing Compounds Rash     Outpatient Medications Prior to Visit  Medication Sig Dispense Refill  . clonazePAM (KLONOPIN) 0.5 MG tablet Take 0.5 mg by mouth 2 (two) times daily as needed for anxiety.    . divalproex (DEPAKOTE) 500 MG DR tablet Take 1 tablet (500 mg total) by mouth 2 (two) times daily. 60 tablet 3  . DULoxetine (CYMBALTA) 60 MG capsule Take 1 capsule (60 mg total) by mouth every morning. 30 capsule 4  . furosemide (LASIX) 20 MG tablet Take 1 tablet (20 mg total) by mouth daily. 30 tablet 3  . methocarbamol (ROBAXIN) 500 MG tablet Take 1 tablet (500 mg total) by mouth every 6 (six) hours as needed for muscle spasms. 60 tablet 1  . OLANZapine (ZYPREXA) 20 MG tablet Take 1 tablet (20 mg total) by mouth at bedtime. 30 tablet 4  . rivaroxaban (XARELTO) 20 MG TABS tablet Take 1 tablet (20 mg total) by mouth daily with supper. 30 tablet 4  . simethicone (MYLICON) 80 MG chewable tablet Chew 80 mg by mouth every 6  (six) hours as needed for flatulence.    . zinc oxide (BALMEX) 11.3 % CREA cream Apply 1 application topically 2 (two) times daily. 85 g 1  . loperamide (IMODIUM) 2 MG capsule Take 1 capsule (2 mg total) by mouth as needed for diarrhea or loose stools. (Patient not taking: Reported on 08/25/2019) 30 capsule 3  . naproxen (NAPROSYN) 500 MG tablet Take 1 tablet (500 mg total) by mouth 2 (two) times daily with a meal. 60 tablet 0  . traMADol (ULTRAM) 50 MG tablet Take 50 mg by mouth every 6 (six) hours as needed for moderate pain.     No facility-administered medications prior to visit.      Review of Systems  Constitutional: Positive for activity change.  HENT: Negative.   Eyes: Negative.   Respiratory: Negative for cough, shortness of breath and wheezing.   Cardiovascular: Negative for chest pain, palpitations and leg swelling.  Gastrointestinal: Negative for abdominal distention, abdominal pain, anal bleeding, blood in stool, constipation, diarrhea, nausea, rectal pain and vomiting.  Endocrine: Negative.   Genitourinary: Negative.   Musculoskeletal: Positive for back pain and gait problem.  Psychiatric/Behavioral: Positive for decreased concentration and sleep disturbance. Negative for dysphoric mood. The patient is nervous/anxious.        Objective:   Physical Exam  Vitals:   09/01/19 0901  BP: 127/82  Pulse: (!) 107  Resp: 18  Temp: 98.3 F (36.8 C)  SpO2: 97%  Weight: 273 lb (123.8 kg)  Height: 6' 1"  (1.854 m)    Gen: Pleasant, well-nourished, in no distress,  normal affect  ENT: No lesions,  mouth clear,  oropharynx clear, no postnasal drip  Neck: No JVD, no TMG, no carotid bruits  Lungs: No use of accessory muscles, no dullness  to percussion, clear without rales or rhonchi  Cardiovascular: RRR, heart sounds normal, no murmur or gallops, 3+ peripheral edema  Abdomen: soft and NT, no HSM,  BS normal  Musculoskeletal: Point tenderness in the mid thoracic area and  also in the paraspinal area near the lumbar  Neuro: alert, non focal  Skin: Warm, no lesions or rashes  IR Radiologist Eval & Mgmt  Result Date: 08/31/2019 Please refer to notes tab for details about interventional procedure. (Op Note)    CT of chest previously obtained was reviewed and showed no pulmonary emboli and no nodules.  Note patient stated he thought there were nodules presents in previous CTs.  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   Acute bilateral low back pain without sciatica Continued persistent mid thoracic and lower lumbar pain.  As this is progressing to a Workmen's Comp. claim I am in a proceed to order an MRI of the thoracic and lumbar spine and hold Berneda Rose on this for that area I checked the [employer] and there are no recent prescriptions other than the ones I have issued for opiates  I did proceed with a prescription for oxycodone 15 mg every 4 hours as needed and dispensed 40 of those  The patient will sign a pain contract and also urine drug screen is obtained      Bipolar 1 disorder ([place]) Bipolar disorder currently on Zyprexa and Depakote stable at this time  Crohn's disease with complication ([place]) Crohn's disease status post total colectomy with ileostomy in place  Follow-up per gastroenterology  The rectum has been oversewn after the colectomy shows a small fissure and an area of purulence on exam  We will prescribe Augmentin for 10 days 1 twice daily and also topical Neosporin to the site and continue to hold Xarelto  Due to nausea will prescribe Zofran as well as needed  Multiple subsegmental pulmonary emboli without acute cor pulmonale  (HCC) We will continue to hold Xarelto and study both lower extremities with vascular Doppler ultrasounds and we will hold off placing inferior vena cava filter for now   Diagnoses and all orders for this visit:  Acute bilateral low back pain without sciatica -     MR THORACIC SPINE W WO CONTRAST; Future -     MR LUMBAR SPINE W WO CONTRAST; Future -     [id number] 11+Oxyco+Alc+Crt-Bund  Acute midline thoracic back pain -     MR THORACIC SPINE W WO CONTRAST; Future -     [id number] 11+Oxyco+Alc+Crt-Bund  Multiple subsegmental pulmonary emboli without acute cor pulmonale (HCC) -     VAS [place] LOWER EXTREMITY VENOUS (DVT); Future  Bipolar 1 disorder (HCC)  Crohn's disease with complication, unspecified gastrointestinal tract location ([place])  Other orders -     oxyCODONE (ROXICODONE) 15 MG immediate release tablet; Take 1 tablet (15 mg total) by mouth every 4 (four) hours as needed for pain. -     amoxicillin-clavulanate (AUGMENTIN) 875-125 MG tablet; Take 1 tablet by mouth 2 (two) times daily. -     ondansetron (ZOFRAN) 4 MG tablet; Take 1 tablet (4 mg total) by mouth every 8 (eight) hours  as needed for nausea or vomiting.

## 2019-09-01 NOTE — Patient Instructions (Addendum)
Start oxycodone 72m every 4hours as needed  zofran as needed for nausea  Urine drug screen and sign drug control substance contract  MRI of back made  Venous doppler of lower extremites ordered  Take augmentin antibiotic for rectum and apply ointment on sore area  Stay off xFrontierfor now  Return two weeks face to face

## 2019-09-01 NOTE — Assessment & Plan Note (Addendum)
Crohn's disease status post total colectomy with ileostomy in place  Follow-up per gastroenterology  The rectum has been oversewn after the colectomy shows a small fissure and an area of purulence on exam  We will prescribe Augmentin for 10 days 1 twice daily and also topical Neosporin to the site and continue to hold Xarelto  Due to nausea will prescribe Zofran as well as needed

## 2019-09-01 NOTE — Assessment & Plan Note (Signed)
Continued persistent mid thoracic and lower lumbar pain.  As this is progressing to a Workmen's Comp. claim I am in a proceed to order an MRI of the thoracic and lumbar spine and hold Juan Hester on this for that area I checked the [employer] and there are no recent prescriptions other than the ones I have issued for opiates  I did proceed with a prescription for oxycodone 15 mg every 4 hours as needed and dispensed 40 of those  The patient will sign a pain contract and also urine drug screen is obtained

## 2019-09-01 NOTE — Progress Notes (Signed)
Hospital F /u   Dealing with anxiety and depression/ does not have thoughts of harming himself nor a plan  MRI Scheduled with Anthony  VAS [place] scheduled  Both to be done at [facility]

## 2019-09-02 ENCOUNTER — Ambulatory Visit: Payer: Self-pay | Admitting: Gastroenterology

## 2019-09-03 ENCOUNTER — Other Ambulatory Visit: Payer: Self-pay

## 2019-09-03 ENCOUNTER — Ambulatory Visit (HOSPITAL_COMMUNITY)
Admission: RE | Admit: 2019-09-03 | Discharge: 2019-09-03 | Disposition: A | Discharge status: Home / Self Care | Payer: Self-pay | Source: Ambulatory Visit | Attending: Critical Care Medicine | Admitting: Critical Care Medicine

## 2019-09-03 DIAGNOSIS — R600 Localized edema: Secondary | ICD-10-CM | POA: Insufficient documentation

## 2019-09-03 DIAGNOSIS — R7989 Other specified abnormal findings of blood chemistry: Secondary | ICD-10-CM | POA: Insufficient documentation

## 2019-09-03 MED FILL — AMOX-CLAV 875-125 MG TABLET: 875-125 | 10 days supply | Qty: 20 | Fill #0

## 2019-09-03 MED FILL — ONDANSETRON HCL 4 MG TABLET: 4 | 6 days supply | Qty: 20 | Fill #0

## 2019-09-03 NOTE — Progress Notes (Signed)
Lower extremity venous has been completed.   Preliminary results in [facility].   Abram Sander 09/03/2019 9:56 AM

## 2019-09-09 LAB — DRUG SCREEN 764883 11+OXYCO+ALC+CRT-BUND
Amphetamines, Urine: NEGATIVE ng/mL
BENZODIAZ UR QL: NEGATIVE ng/mL
Barbiturate: NEGATIVE ng/mL
Cocaine (Metabolite): NEGATIVE ng/mL
Creatinine: 222.9 mg/dL (ref 20.0–300.0)
Ethanol: NEGATIVE %
Meperidine: NEGATIVE ng/mL
Methadone Screen, Urine: NEGATIVE ng/mL
OPIATE SCREEN URINE: NEGATIVE ng/mL
Oxycodone/Oxymorphone, Urine: NEGATIVE ng/mL
Phencyclidine: NEGATIVE ng/mL
Propoxyphene: NEGATIVE ng/mL
Tramadol: NEGATIVE ng/mL
pH, Urine: 5.9 (ref 4.5–8.9)

## 2019-09-09 LAB — CANNABINOID CONFIRMATION, UR
CANNABINOIDS: POSITIVE — AB
Carboxy THC GC/MS Conf: 80 ng/mL

## 2019-09-15 ENCOUNTER — Other Ambulatory Visit: Payer: Self-pay

## 2019-09-15 ENCOUNTER — Ambulatory Visit (HOSPITAL_COMMUNITY)
Admission: RE | Admit: 2019-09-15 | Discharge: 2019-09-15 | Disposition: A | Discharge status: Home / Self Care | Payer: Self-pay | Source: Ambulatory Visit | Attending: Critical Care Medicine | Admitting: Critical Care Medicine

## 2019-09-15 ENCOUNTER — Ambulatory Visit (HOSPITAL_COMMUNITY): Payer: Self-pay

## 2019-09-15 DIAGNOSIS — M545 Low back pain, unspecified: Secondary | ICD-10-CM

## 2019-09-15 MED ORDER — GADOBUTROL 1 MMOL/ML IV SOLN
10.0000 mL | Freq: Once | INTRAVENOUS | Status: AC | PRN
  Administered 2019-09-15: 10 mL via INTRAVENOUS

## 2019-09-16 ENCOUNTER — Telehealth: Payer: Self-pay | Admitting: Critical Care Medicine

## 2019-09-16 DIAGNOSIS — M545 Low back pain, unspecified: Secondary | ICD-10-CM

## 2019-09-16 NOTE — Telephone Encounter (Signed)
I connected with this patient by phone and given the results of his MRI he has foraminal impingement L5-S1 and L4-L5 along with congenital short pedicles  I been referred this patient to orthopedic spine for further evaluation and he is applying for Medicaid through disability which has not yet occurred he is not a candidate for the orange card at [facility] health discount because of this application  He no longer has rectal bleeding I asked him to resume his Xarelto if he bleeds again to call if he rebleeds and to stop the blood thinner at that time he is not a candidate for inferior vena cava filter  The patient does need an appointment please establish that for me thank you

## 2019-09-16 NOTE — Telephone Encounter (Signed)
Appt made on 10/06/2019

## 2019-09-20 ENCOUNTER — Telehealth: Payer: Self-pay

## 2019-09-20 MED ORDER — OXYCODONE HCL 15 MG PO TABS: 15.0000 mg | ORAL_TABLET | ORAL | 0 refills | Status: DC | PRN

## 2019-09-20 NOTE — Telephone Encounter (Signed)
1) Medication(s) Requested (by name): OXYCODONE  2) Pharmacy of Choice: [facility] [address]  3) Special Requests: per pt for continued back pain.    Approved medications will be sent to the pharmacy, we will reach out if there is an issue.  Requests made after 3pm may not be addressed until the following business day!  If a patient is unsure of the name of the medication(s) please note and ask patient to call back when they are able to provide all info, do not send to responsible party until all information is available!

## 2019-09-20 NOTE — Telephone Encounter (Signed)
Oxycodone refilled.  PDMP reviewed

## 2019-09-21 ENCOUNTER — Telehealth: Payer: Self-pay | Admitting: Critical Care Medicine

## 2019-09-21 MED ORDER — OXYCODONE HCL 15 MG PO TABS: 15.0000 mg | ORAL_TABLET | ORAL | 0 refills | Status: DC | PRN

## 2019-09-21 NOTE — Telephone Encounter (Signed)
Called pt.

## 2019-09-21 NOTE — Telephone Encounter (Signed)
1) Medication(s) Requested (by name):oxyCODONE (ROXICODONE) 15 MG immediate release tablet    2) Pharmacy [facility]: [facility] [address]   3) Special Requests:   Approved medications will be sent to the pharmacy, we will reach out if there is an issue.  Requests made after 3pm may not be addressed until the following business day!  If a patient is unsure of the name of the medication(s) please note and ask patient to call back when they are able to provide all info, do not send to responsible party until all information is available!

## 2019-09-21 NOTE — Telephone Encounter (Signed)
Let pt know [facility] [address]

## 2019-09-28 ENCOUNTER — Ambulatory Visit (HOSPITAL_COMMUNITY): Payer: Self-pay | Admitting: Licensed Clinical Social Worker

## 2019-09-28 ENCOUNTER — Other Ambulatory Visit: Payer: Self-pay

## 2019-09-28 ENCOUNTER — Telehealth (HOSPITAL_COMMUNITY): Payer: Self-pay | Admitting: Psychiatry

## 2019-10-03 ENCOUNTER — Other Ambulatory Visit: Payer: Self-pay

## 2019-10-03 MED ORDER — ONDANSETRON HCL 4 MG PO TABS: 4.0000 mg | ORAL_TABLET | Freq: Three times a day (TID) | ORAL | 0 refills | Status: DC | PRN

## 2019-10-04 ENCOUNTER — Other Ambulatory Visit: Payer: Self-pay | Admitting: Critical Care Medicine

## 2019-10-04 MED ORDER — OXYCODONE HCL 15 MG PO TABS: 15.0000 mg | ORAL_TABLET | ORAL | 0 refills | Status: DC | PRN

## 2019-10-04 NOTE — Progress Notes (Signed)
Med refill

## 2019-10-05 ENCOUNTER — Ambulatory Visit (INDEPENDENT_AMBULATORY_CARE_PROVIDER_SITE_OTHER): Payer: Self-pay | Admitting: Orthopaedic Surgery

## 2019-10-05 ENCOUNTER — Encounter: Payer: Self-pay | Admitting: Orthopaedic Surgery

## 2019-10-05 VITALS — BP 151/103 | HR 101 | Ht 73.0 in | Wt 273.0 lb

## 2019-10-05 DIAGNOSIS — G8929 Other chronic pain: Secondary | ICD-10-CM

## 2019-10-05 DIAGNOSIS — M545 Low back pain, unspecified: Secondary | ICD-10-CM

## 2019-10-05 DIAGNOSIS — M5126 Other intervertebral disc displacement, lumbar region: Secondary | ICD-10-CM

## 2019-10-05 NOTE — Progress Notes (Signed)
Office Visit Note   Patient: Juan Hester           Date of Birth: 04-04-1981           MRN: [id number] Visit Date: 10/05/2019              Requested by: [name], MD [address],  [place] [address] PCP: [name], MD   Assessment & Plan: Visit Diagnoses:  1. Chronic bilateral low back pain, unspecified whether sciatica present   2. Acute bilateral low back pain without sciatica   3. Protrusion of lumbar intervertebral disc     Plan: We discussed the pain medication he is currently taking.  Although he was able to get off of it in the past we reviewed potential problems with narcotic usage.  Patient does have some congenital short pedicles contributing to moderate narrowing at L5-S1.  He does not have severe compression and I would expect he should get some relief with an epidural.  He is overweight needs to work on a walking program and gradually lose weight.  Patient to follow-up with me several weeks after his epidural.  Follow-Up Instructions: No follow-ups on file.   Orders:  Orders Placed This Encounter  Procedures  . Ambulatory referral to Physical Medicine Rehab   No orders of the defined types were placed in this encounter.     Procedures: No procedures performed   Clinical Data: No additional findings.   Subjective: Chief Complaint  Patient presents with  . Lower Back - Pain    HPI 39 year old male referred by Dr. Asencion Noble with problems with back pain.  Patient had back pain that radiates into his buttocks states the pain in his back is been 4 out of 10 at times when sharp at 7 or 8 out of 10.  He stands on his feet when he is working and states occasionally feels like electrical feeling shooting into his left leg.  He has had history of Crohn's disease history of pulmonary embolism and also colon cancer.  At 1 point he was on oxycodone for extended period of time and states he took himself off of it.  He has the oxycodone  15 mg which he states he takes so that he can work.  MRI scan has been done and is available for review.  Review of Systems possible bipolar disorder Crohn's disease previous colostomy history of pulmonary embolism otherwise noncontributory to HPI.   Objective: Vital Signs: BP (!) 151/103   Pulse (!) 101   Ht 6' 1"  (1.854 m)   Wt 273 lb (123.8 kg)   BMI 36.02 kg/m   Physical Exam Constitutional:      Appearance: He is well-developed.  HENT:     Head: Normocephalic and atraumatic.  Eyes:     Pupils: Pupils are equal, round, and reactive to light.  Neck:     Thyroid: No thyromegaly.     Trachea: No tracheal deviation.  Cardiovascular:     Rate and Rhythm: Normal rate.  Pulmonary:     Effort: Pulmonary effort is normal.     Breath sounds: No wheezing.  Abdominal:     General: Bowel sounds are normal.     Palpations: Abdomen is soft.  Skin:    General: Skin is warm and dry.     Capillary Refill: Capillary refill takes less than 2 seconds.  Neurological:     Mental Status: He is alert and oriented to person, place,  and time.  Psychiatric:        Behavior: Behavior normal.        Thought Content: Thought content normal.        Judgment: Judgment normal.     Ortho Exam patient has negative straight leg raising 90 degrees negative logroll to the hips.  Knee and ankle jerk are intact he is able to heel and toe walk.  No knee effusion no joint deformity no pitting edema.  Specialty Comments:  No specialty comments available.  Imaging: CLINICAL DATA:  Mid and low back pain.  Prior fall.  EXAM: MRI LUMBAR SPINE WITHOUT AND WITH CONTRAST  TECHNIQUE: Multiplanar and multiecho pulse sequences of the lumbar spine were obtained without and with intravenous contrast.  CONTRAST:  75m GADAVIST GADOBUTROL 1 MMOL/ML IV SOLN  COMPARISON:  08/25/2019 CT abdomen and lumbar spine CT from 05/22/2019  FINDINGS: Segmentation: The lowest lumbar type non-rib-bearing vertebra  is labeled as L5.  Alignment:  3 mm degenerative retrolisthesis at L5-S1  Vertebrae: No significant vertebral marrow edema is identified. No significant abnormal vertebral enhancement. Mild prominence of epidural adipose tissues. Mild disc desiccation at L5-S1.  Conus medullaris and cauda equina: Conus extends to the L1-2 level. Conus and cauda equina appear normal.  Paraspinal and other soft tissues: Unremarkable  Disc levels:  L1-2: Unremarkable.  L2-3: Unremarkable.  L3-4: Unremarkable.  L4-5: Mild left and borderline right foraminal stenosis due to short pedicles and mild disc bulge.  L5-S1: Moderate right and mild left foraminal stenosis due to short pedicles, mild facet arthropathy, and mild intervertebral spurring. There is also a central disc protrusion not directly contributing to the impingement.  IMPRESSION: 1. 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   Electronically Signed   By: WVan ClinesM.D.   On: 09/16/2019 10:00    PMFS History: Patient Active Problem List   Diagnosis Date Noted  . Protrusion of lumbar intervertebral disc 10/05/2019  . Localized edema 08/18/2019  . Multiple subsegmental pulmonary emboli without acute cor pulmonale ([place] 07/21/2019  . Bipolar 1 disorder ([place] 07/21/2019  . Acute bilateral low back pain without sciatica 07/21/2019  . History of colon cancer 07/21/2019  . Crohn's disease with complication ([place] [id number] . Colostomy, evaluate ([place] 05/05/2019   Past Medical History:  Diagnosis Date  . Crohn disease ([place]   . PE (pulmonary thromboembolism) ([place]   . Thyroid disease     No family history on file.  Past Surgical History:  Procedure Laterality Date  . ABDOMINAL SURGERY    . COLON SURGERY    . IR RADIOLOGIST EVAL & MGMT  08/31/2019  . SMALL  INTESTINE SURGERY     Social History   Occupational History  . Not on file  Tobacco Use  . Smoking status: Current Every Day Smoker    Packs/day: 1.00  . Smokeless tobacco: Never Used  Vaping Use  . Vaping Use: Never used  Substance and Sexual Activity  . Alcohol use: Yes  . Drug use: Not Currently  . Sexual activity: Yes

## 2019-10-06 ENCOUNTER — Ambulatory Visit: Payer: Self-pay | Admitting: Critical Care Medicine

## 2019-10-12 ENCOUNTER — Ambulatory Visit (INDEPENDENT_AMBULATORY_CARE_PROVIDER_SITE_OTHER): Payer: No Payment, Other | Admitting: Licensed Clinical Social Worker

## 2019-10-12 ENCOUNTER — Other Ambulatory Visit: Payer: Self-pay

## 2019-10-12 DIAGNOSIS — F3164 Bipolar disorder, current episode mixed, severe, with psychotic features: Secondary | ICD-10-CM
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
## 2019-10-15 ENCOUNTER — Other Ambulatory Visit: Payer: Self-pay

## 2019-10-15 ENCOUNTER — Emergency Department (HOSPITAL_COMMUNITY)
Admission: EM | Admit: 2019-10-15 | Discharge: 2019-10-16 | Disposition: A | Discharge status: Home / Self Care | Payer: Self-pay | Attending: Emergency Medicine | Admitting: Emergency Medicine

## 2019-10-15 ENCOUNTER — Ambulatory Visit (HOSPITAL_COMMUNITY)
Admission: RE | Admit: 2019-10-15 | Discharge: 2019-10-15 | Disposition: A | Discharge status: Home / Self Care | Payer: Self-pay | Attending: Psychiatry | Admitting: Psychiatry

## 2019-10-15 ENCOUNTER — Encounter (HOSPITAL_COMMUNITY): Payer: Self-pay | Admitting: Emergency Medicine

## 2019-10-15 ENCOUNTER — Telehealth (HOSPITAL_COMMUNITY): Payer: Self-pay | Admitting: Licensed Clinical Social Worker

## 2019-10-15 DIAGNOSIS — R441 Visual hallucinations: Secondary | ICD-10-CM | POA: Insufficient documentation

## 2019-10-15 DIAGNOSIS — F1010 Alcohol abuse, uncomplicated: Secondary | ICD-10-CM | POA: Diagnosis present

## 2019-10-15 DIAGNOSIS — R440 Auditory hallucinations: Secondary | ICD-10-CM | POA: Insufficient documentation

## 2019-10-15 DIAGNOSIS — M545 Low back pain: Secondary | ICD-10-CM | POA: Insufficient documentation

## 2019-10-15 DIAGNOSIS — Z20822 Contact with and (suspected) exposure to covid-19: Secondary | ICD-10-CM | POA: Insufficient documentation

## 2019-10-15 DIAGNOSIS — R4689 Other symptoms and signs involving appearance and behavior: Secondary | ICD-10-CM | POA: Insufficient documentation

## 2019-10-15 DIAGNOSIS — F3131 Bipolar disorder, current episode depressed, mild: Secondary | ICD-10-CM | POA: Diagnosis present

## 2019-10-15 DIAGNOSIS — Z79899 Other long term (current) drug therapy: Secondary | ICD-10-CM | POA: Insufficient documentation

## 2019-10-15 DIAGNOSIS — E669 Obesity, unspecified: Secondary | ICD-10-CM | POA: Insufficient documentation

## 2019-10-15 LAB — COMPREHENSIVE METABOLIC PANEL
ALT: 153 U/L — ABNORMAL HIGH (ref 0–44)
AST: 163 U/L — ABNORMAL HIGH (ref 15–41)
Albumin: 4.2 g/dL (ref 3.5–5.0)
Alkaline Phosphatase: 175 U/L — ABNORMAL HIGH (ref 38–126)
Anion gap: 16 — ABNORMAL HIGH (ref 5–15)
BUN: 6 mg/dL (ref 6–20)
CO2: 22 mmol/L (ref 22–32)
Calcium: 9.9 mg/dL (ref 8.9–10.3)
Chloride: 101 mmol/L (ref 98–111)
Creatinine, Ser: 1.28 mg/dL — ABNORMAL HIGH (ref 0.61–1.24)
GFR calc Af Amer: >60 mL/min (ref >60)
GFR calc non Af Amer: >60 mL/min (ref >60)
Glucose, Bld: 110 mg/dL — ABNORMAL HIGH (ref 70–99)
Potassium: 3.3 mmol/L — ABNORMAL LOW (ref 3.5–5.1)
Sodium: 139 mmol/L (ref 135–145)
Total Bilirubin: 1.2 mg/dL (ref 0.3–1.2)
Total Protein: 8.2 g/dL — ABNORMAL HIGH (ref 6.5–8.1)

## 2019-10-15 LAB — RAPID URINE DRUG SCREEN, HOSP PERFORMED
Amphetamines: NOT DETECTED
Barbiturates: NOT DETECTED
Benzodiazepines: NOT DETECTED
Cocaine: NOT DETECTED
Opiates: NOT DETECTED
Tetrahydrocannabinol: NOT DETECTED

## 2019-10-15 LAB — ACETAMINOPHEN LEVEL: Acetaminophen (Tylenol), Serum: <10 ug/mL — ABNORMAL LOW (ref 10–30)

## 2019-10-15 LAB — CBC
HCT: 47.1 % (ref 39.0–52.0)
Hemoglobin: 16.5 g/dL (ref 13.0–17.0)
MCH: 31.5 pg (ref 26.0–34.0)
MCHC: 35 g/dL (ref 30.0–36.0)
MCV: 90.1 fL (ref 80.0–100.0)
Platelets: 228 10*3/uL (ref 150–400)
RBC: 5.23 MIL/uL (ref 4.22–5.81)
RDW: 13.7 % (ref 11.5–15.5)
WBC: 10.2 10*3/uL (ref 4.0–10.5)
nRBC: 0 % (ref 0.0–0.2)

## 2019-10-15 LAB — ETHANOL: Alcohol, Ethyl (B): 242 mg/dL — ABNORMAL HIGH (ref <10)

## 2019-10-15 LAB — SALICYLATE LEVEL: Salicylate Lvl: <7 mg/dL — ABNORMAL LOW (ref 7.0–30.0)

## 2019-10-15 MED ORDER — ZIPRASIDONE MESYLATE 20 MG IM SOLR
20.0000 mg | Freq: Once | INTRAMUSCULAR | Status: DC
  Filled 2019-10-15: qty 20

## 2019-10-15 MED ORDER — ACETAMINOPHEN 325 MG PO TABS: 650.0000 mg | ORAL_TABLET | Freq: Four times a day (QID) | ORAL | Status: DC | PRN
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
## 2019-10-15 NOTE — Telephone Encounter (Signed)
On 10/14/19 this clinician found a vm on personal mobile phone used for private practice from this pt. Can only guess he got phone # from an online search. He did not state his name but stated his phone # and enough information to verify identity. He stated he was having increased difficulty and recalled talking about whether or not he might need hospitalization during initial assessment on 6/30. He asked for a return call. He continued to call multiple times, left one more vm essentially same info. This clinician called pt back approx 5pm from [facility] phone. Call went to pt's vm. Left a detailed message regarding pt being able to come to crisis center open 24 hours for eval with phone number and address provided slowly two times.

## 2019-10-15 NOTE — Discharge Planning (Signed)
RNCM consulted regarding medication assistance for uninsured pt.  Awaiting results of TTS consult.

## 2019-10-15 NOTE — ED Notes (Signed)
Pt pulled off his colostomy bag (report from previous RN stated that he has done this multiple times today). Pt given new supplies and is currently placing new colostomy on.

## 2019-10-15 NOTE — ED Notes (Signed)
Patient removed colostomy bag. Fecal matter leaking all over room, sink, bed etc. Attempted to redirect patient to bed. Patient refused, stated he is fine. Patient then walked to staff computer and began pushing buttons on keyboard. Writer educated patient he cannot be on computer. Patient got back into bed at this time.

## 2019-10-15 NOTE — ED Notes (Signed)
Pt wanded by security. 

## 2019-10-15 NOTE — ED Notes (Signed)
Patient refused to participate in TTS consult. Closed his eyes, laid on bed, and would not respond to any questions.

## 2019-10-15 NOTE — Consult Note (Signed)
  Attempted to assess patient, patient refuses to participate at this time. MD made aware. Will attempt again when patient more awake/alert.

## 2019-10-15 NOTE — BH Assessment (Addendum)
-  Pt came to [facility] via Mobile Crisis Management.  He reportedly was suicidal and homicidal.  He told [facility] that he had taken his night meds (depakote, zyprexa, seroquel) and drank a 5th of liquor.   AC Wynonia Hazard and clinician called Talbot Grumbling, NP and described situation to her.  She said that patient needed to be assessed.  Clinician went to [facility] lobby where [facility] and a nurse were with patient, along with [facility].    Pt is stumbling and has unsteady gait.  He barely answers any questions and is very somnolent.  Pt has an ostomy bag which is leaking and he had to be cleaned up.   AC Wynonia Hazard contacted EMS for transport to [facility] for med clearance.  Clinician informed Talbot Grumbling, NP.  NP said that was fine, that he needed to go ahead to [facility] for med clearance.  TTS assessment was not able to be completed due to patient's presentation.

## 2019-10-15 NOTE — ED Notes (Signed)
Pt ambulating to restroom unassisted with a steady gait.

## 2019-10-15 NOTE — ED Triage Notes (Signed)
Patient took prescribed medication and a fifth of liquor. Patient was at [facility] and they sent him over to [facility]. Patient has a colostomy bag. Patient states he wants to kill himself.

## 2019-10-16 DIAGNOSIS — F3131 Bipolar disorder, current episode depressed, mild: Secondary | ICD-10-CM

## 2019-10-16 DIAGNOSIS — F1010 Alcohol abuse, uncomplicated: Secondary | ICD-10-CM

## 2019-10-16 HISTORY — DX: Alcohol abuse, uncomplicated: F10.10

## 2019-10-16 LAB — SARS CORONAVIRUS 2 BY RT PCR (HOSPITAL ORDER, PERFORMED IN ~~LOC~~ HOSPITAL LAB): SARS Coronavirus 2: NEGATIVE

## 2019-10-16 MED ORDER — QUETIAPINE FUMARATE 100 MG PO TABS: 200.0000 mg | ORAL_TABLET | Freq: Every day | ORAL | Status: DC

## 2019-10-16 MED ORDER — RIVAROXABAN 20 MG PO TABS
20.0000 mg | ORAL_TABLET | Freq: Every day | ORAL | Status: DC
  Filled 2019-10-16: qty 1

## 2019-10-16 MED ORDER — FUROSEMIDE 20 MG PO TABS
20.0000 mg | ORAL_TABLET | Freq: Every day | ORAL | Status: DC
  Filled 2019-10-16: qty 1

## 2019-10-16 MED ORDER — QUETIAPINE FUMARATE 200 MG PO TABS: 200.0000 mg | ORAL_TABLET | Freq: Every day | ORAL | 0 refills | Status: DC

## 2019-10-16 MED ORDER — DIVALPROEX SODIUM 500 MG PO DR TAB
500.0000 mg | DELAYED_RELEASE_TABLET | Freq: Two times a day (BID) | ORAL | Status: DC
  Administered 2019-10-16: 500 mg via ORAL
  Filled 2019-10-16: qty 1

## 2019-10-16 MED ORDER — QUETIAPINE FUMARATE 100 MG PO TABS: 100.0000 mg | ORAL_TABLET | Freq: Every day | ORAL | Status: DC
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
## 2019-10-16 NOTE — ED Notes (Signed)
Pt DC d off unit to home per provider. Pt alert, calm, cooperative, no s/s of distress. DC information and resources given to and reviewed with pt, pt acknowledged understanding. Belongings given to pt. Pt ambulatory off unit, escorted by NT. Pt transported by taxi

## 2019-10-16 NOTE — ED Provider Notes (Signed)
Emergency Medicine Observation Re-evaluation Note  Juan Hester is a 39 y.o. male, seen on rounds today.  Pt initially presented to the ED for complaints of Suicidal Currently, the patient has been seen by behavioral health service line.  Awaiting disposition.  Physical Exam  BP 118/83 (BP Location: Left Arm)   Pulse 75   Temp 98.4 F (36.9 C) (Oral)   Resp 18   Ht 1.854 m (6' 1" )   Wt 136.1 kg   SpO2 96%   BMI 39.58 kg/m  Physical Exam  ED Course / MDM  EKG:    I have reviewed the labs performed to date as well as medications administered while in observation.  Recent changes in the last 24 hours include patient has been stable. Plan  Current plan is for placement. Patient is under full IVC at this time.   Lacretia Leigh, MD 10/16/19 1002

## 2019-10-16 NOTE — ED Notes (Signed)
Pt has been sleeping this AM.  Pt assessed by provider. Sitter at bedside.

## 2019-10-16 NOTE — Discharge Instructions (Signed)
Follow up with [facility]

## 2019-10-18 NOTE — Progress Notes (Addendum)
Subjective:    Patient ID: Juan Hester, male    DOB: 1981-01-12, 39 y.o.   MRN: [id number] Virtual Visit via Video Note  I connected with@ on 10/19/19 at@ by a video enabled telemedicine application and verified that I am speaking with the correct person using two identifiers.   Consent:  I discussed the limitations, risks, security and privacy concerns of performing an evaluation and management service by video visit and the availability of in person appointments. I also discussed with the patient that there may be a patient responsible charge related to this service. The patient expressed understanding and agreed to proceed.  Location of patient: pt at home  Location of provider: I am in my office   Persons participating in the televisit with the patient.   No one else on the call    History of Present Illness:   This is a complex 39 year old male who just arrived here from the [place] area and currently is living with relatives who presents for evaluation of pulmonary embolism history, Crohn's disease, history of colon carcinoma status post sigmoid resection with colostomy formation, and need for paperwork regarding patient's work status.  The patient currently is living in a hotel.  Patient arrived here in December.  The patient in [place] was under care by gastroenterologist was on Remicade for Crohn's disease and also being followed for his oncology status.  He is due for a colonoscopy at this time.  He works at [employer] he has history of bipolar disorder and had previously been on Cogentin Zyprexa, Depakote and Cymbalta.  Patient is also had major depression as well in the past.  The patient states he has significant volume output from his colostomy with a diarrheal type syndrome and he does have Imodium for this.  The patient has abdominal pain at times.  He has chronic fatigue.  He cannot stand and one place for prolonged periods of time.  He fell at work and his lower back  has been difficult for him since that fall.  He was in the emergency room and underwent CT scan of the head and this was negative.  The patient is needing refills on his medications and he does not have insurance at this time.  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  The patient states his back pain is worsening.  He did fall at work and March.  He states the pain was tolerable but now markedly worse if he stands for long periods of time.  He was not able to use a Lidoderm patch as he cannot reach the center part of his back.  He notes pain shooting down both legs.  He has some difficulty walking as well at this time.  There has not been any lumbar imaging studies done although when he went to the emergency room previously there was a CT scan of the abdomen was done and did not show changes in the lumbar spine but this was a limited study  Patient states his cyclobenzaprine is not useful but he is not taking it on a regular basis The patient has no change in his breathing status and is maintaining his  anticoagulant for history of pulmonary emboli.  The patient is yet to achieve a gastroenterology appointment as he needs the orange card first.  He has his financial paperwork together so make an appointment for the financial counselor  08/18/2019 The patient comes in with lower extremity edema and is just gone to the ER 1 day ago for progressive back pain which occurred on the job.  He has since been let go from the job he was working.  He has another job as well he works.  The patient is yet to receive his lumbar x-rays as ordered.  He does not have financial assistance as of yet to be able to achieve an  MRI or orthopedics appointment.  Patient notes increased edema in the lower extremities and some shortness of breath with exertion. Note I reviewed his CT scan of the chest March which did not show pulmonary emboli but did show atelectasis at the bases of the lungs.   Wt Readings from Last 3 Encounters: 08/18/19 : 274 lb (124.3 kg) 07/21/19 : 264 lb 3.2 oz (119.8 kg) 07/11/19 : 260 lb (117.9 kg)  Patient cannot sleep flat is still smoking half pack a day of cigarettes denies wheezing slight productive cough of green mucus no sinus complaints  09/01/2019 This patient is seen in return follow-up and has been to the emergency room twice since I last saw him several weeks ago.  Patient has continued pain in the lower back.  He has a normal lumbar study.  He had fallen at work.  He is now been fired from his job.  He has retained an attorney for [employer].  The patient states his pain is more in the upper thoracic area as well as in the lumbar area at this time.  Note he also went to interventional radiology they recommend not starting in this patient with an in fear vena cava filter but instead study his lower extremities first.  He is yet to have a vascular study the lower extremities for DVT.  He is now off Xarelto because he was bleeding from an opening of the rectal closure from his previous total colectomy.  The patient still has some difficulty in that area but the bleeding is less off the Xarelto.  Patient is also seeing gastroenterology and they are awaiting further evaluation of the rectal tear.  The patient is yet to see a surgeon for this.  He is here today for follow-up.  10/19/2019 This patient is seen in return follow-up and notes continued low back pain with radiculopathy down the left lower extremity.  He saw orthopedics and they recommended an injection however they said that they could not perform it under his Workmen's Comp. agreement.  He is unable to afford any  type of procedure as he is self-pay at this time.  He is also wishing a second opinion on his back.  In addition to this he has had increased depression and mood swings and this ultimately resulted in increased alcohol use when he was with a friend over the weekend and when the friend left him he became extremely despondent and started thinking of suicidal ideation.  He called for help and was brought to the emergency room where he had a 36-hour stay in involuntary commitment until he can be cleared by psychiatry which did occurred he was just discharged 24 hours ago.  He was given a paper prescription for Seroquel and also for his Depakote however he cannot afford these  medications at this time.  He was also not given a refill on Cymbalta and has run out of the Cymbalta.  He also has run out of his oxycodone as well for his back pain.  He does have a supply of Xarelto and maintains this 20 mg daily without any additional bleeding noted in the rectal area.  When he came to the ER he was very much confused had been drinking excessively and somewhat altered he did clear this out and he has not had further drinking since that time.  He states when he uses the oxycodone he only takes it about 3-4 times daily.  He states the cyclobenzaprine was of no use to him.  He has had several falls because of left-sided weakness in the lower extremity.  He feels like there is electric shock going down his leg and this resulted in the falls.  He has bruised his left shoulder with an injury and a fall in his bathroom. He was unable to obtain transportation therefore this visit was done via video Note the psychiatry doctor did discontinue his Zyprexa in favor of use of Seroquel 200 mg daily.  He does have follow-up with a behavioral therapist and psychiatrist in the next [facility].  Back Pain Pertinent negatives include no abdominal pain or chest pain.   Past Medical History:    Diagnosis Date  . Crohn disease ([place])   . PE (pulmonary thromboembolism) ([place])   . Thyroid disease      History reviewed. No pertinent family history.   Social History   Socioeconomic History  . Marital status: Divorced    Spouse name: Not on file  . Number of children: Not on file  . Years of education: Not on file  . Highest education level: Not on file  Occupational History  . Not on file  Tobacco Use  . Smoking status: Current Every Day Smoker    Packs/day: 1.00  . Smokeless tobacco: Never Used  Vaping Use  . Vaping Use: Never used  Substance and Sexual Activity  . Alcohol use: Yes  . Drug use: Not Currently  . Sexual activity: Yes  Other Topics Concern  . Not on file  Social History Narrative  . Not on file   Social Determinants of Health   Financial Resource Strain:   . Difficulty of Paying Living Expenses:   Food Insecurity:   . Worried About Charity fundraiser in the Last Year:   . Arboriculturist in the Last Year:   Transportation Needs:   . Film/video editor (Medical):   Marland Kitchen Lack of Transportation (Non-Medical):   Physical Activity:   . Days of Exercise per Week:   . Minutes of Exercise per Session:   Stress:   . Feeling of Stress :   Social Connections:   . Frequency of Communication with Friends and Family:   . Frequency of Social Gatherings with Friends and Family:   . Attends Religious Services:   . Active Member of Clubs or Organizations:   . Attends Archivist Meetings:   Marland Kitchen Marital Status:   Intimate Partner Violence:   . Fear of Current or Ex-Partner:   . Emotionally Abused:   Marland Kitchen Physically Abused:   . Sexually Abused:      Allergies  Allergen Reactions  . Azathioprine Other (See Comments)    Pt does not recall reaction    . Ciprofloxacin Other (See  Comments)    Pt does not recall reaction    . Mesalamine Other (See Comments)    Pt does not recall reaction  . Metronidazole Other (See Comments)    Pt does not  recall reaction   . Sulfa Antibiotics Other (See Comments)    Pt does not recall reaction   . Barium Sulfate Rash  . Barium-Containing Compounds Rash     Outpatient Medications Prior to Visit  Medication Sig Dispense Refill  . rivaroxaban (XARELTO) 20 MG TABS tablet Take 1 tablet (20 mg total) by mouth daily with supper. 30 tablet 4  . benztropine (COGENTIN) 2 MG tablet Take 2 mg by mouth daily.    . divalproex (DEPAKOTE) 500 MG DR tablet Take 1 tablet (500 mg total) by mouth 2 (two) times daily. 60 tablet 3  . naproxen (NAPROSYN) 500 MG tablet Take 500 mg by mouth 2 (two) times daily with a meal.    . QUEtiapine (SEROQUEL) 200 MG tablet Take 1 tablet (200 mg total) by mouth at bedtime. 30 tablet 0   No facility-administered medications prior to visit.      Review of Systems  Constitutional: Positive for activity change.  HENT: Negative.   Eyes: Negative.   Respiratory: Negative for cough, shortness of breath and wheezing.   Cardiovascular: Negative for chest pain, palpitations and leg swelling.  Gastrointestinal: Negative for abdominal distention, abdominal pain, anal bleeding, blood in stool, constipation, diarrhea, nausea, rectal pain and vomiting.  Endocrine: Negative.   Genitourinary: Negative.   Musculoskeletal: Positive for back pain and gait problem.  Psychiatric/Behavioral: Positive for agitation, behavioral problems, decreased concentration, dysphoric mood, sleep disturbance and suicidal ideas. Negative for self-injury. The patient is nervous/anxious and is hyperactive.        Objective:   Physical Exam  There were no vitals filed for this visit.  No exam is this is a video visit however the patient is observed on the video visit to be calm and in no acute distress     Assessment & Plan:  I personally reviewed all images and lab data in the [facility] system as well as any outside material available during this office visit and agree with the  radiology impressions.    Lumbar radiculopathy Acute on chronic lumbar radiculopathy with MRI showing protrusion of lumbar intervertebral disc  The patient was offered a steroid injection at orthopedics however the patient could not obtain this as apparently Workmen's Comp. did not cover this  The patient would like a second opinion I had like to refer him to Dr. Erline Levine of neurosurgery who has seen her patients in the past to see if he can help this patient with his lumbar pain and review his MRI  I will refill the oxycodone at 15 mg every 6 hours as needed I given him 40 of these  I did check the [facility] drug database and all of the prior prescriptions are in the system and no other prescribers are seen  The patient has signed a pain contract in the past and his drug screen is negative as well a Narcan prescription was given with this prescription  Multiple subsegmental pulmonary emboli without acute cor pulmonale ([place]) History of pulmonary emboli for now would like to keep the Xarelto on board at 20 mg daily it can be held however for 3 to 4 days prior to any planned neurosurgical injection or procedure  Protrusion of lumbar intervertebral disc As per radiculopathy assessment  Alcohol abuse Alcohol  abuse which the patient states was on the basis of severe depression and anxiety he currently states he is not drinking at this time  Bipolar affective disorder, currently depressed, mild ([place]) Bipolar disorder with ongoing depression now on Depakote and Seroquel however he cannot afford these medications  I will provide the Depakote and Seroquel for the patient by way of medication assistance program at the [facility] outpatient pharmacy  Note since Zyprexa has been discontinued I will discontinue Cogentin  Schizoaffective disorder, bipolar type ([place]) Schizoaffective disorder with bipolar type again will obtain Seroquel and Depakote and discontinue further Zyprexa  I will refill the  Cymbalta  Suicidal ideation The patient was cleared for discharge from the emergency room over the weekend and currently is not suicidal on my exam he does have follow-up with psychiatry   Diagnoses and all orders for this visit:  Lumbar radiculopathy -     Cancel: Ambulatory referral to Neurosurgery -     Ambulatory referral to Neurosurgery  Multiple subsegmental pulmonary emboli without acute cor pulmonale ([facility])  Protrusion of lumbar intervertebral disc  Alcohol abuse  Bipolar affective disorder, currently depressed, mild ([place])  Schizoaffective disorder, bipolar type ([place])  Suicidal ideation  Other orders -     QUEtiapine (SEROQUEL) 200 MG tablet; Take 1 tablet (200 mg total) by mouth at bedtime. -     divalproex (DEPAKOTE) 500 MG DR tablet; Take 1 tablet (500 mg total) by mouth 2 (two) times daily. -     DULoxetine (CYMBALTA) 60 MG capsule; Take 1 capsule (60 mg total) by mouth daily. -     oxyCODONE (ROXICODONE) 15 MG immediate release tablet; Take 1 tablet (15 mg total) by mouth every 6 (six) hours as needed for pain. -     naloxone (NARCAN) nasal spray 4 mg/0.1 mL; Use as needed for overdose symptoms from opiate     Follow Up Instructions: The patient knows a follow-up visit we made face-to-face in 3 weeks   I discussed the assessment and treatment plan with the patient. The patient was provided an opportunity to ask questions and all were answered. The patient agreed with the plan and demonstrated an understanding of the instructions.   The patient was advised to call back or seek an in-person evaluation if the symptoms worsen or if the condition fails to improve as anticipated.  I provided 30 minutes of non-face-to-face time during this encounter  including  median intraservice time , review of notes, labs, imaging, medications  and explaining diagnosis and management to the patient .    Asencion Noble, MD

## 2019-10-19 ENCOUNTER — Ambulatory Visit: Payer: Self-pay | Attending: Critical Care Medicine | Admitting: Critical Care Medicine

## 2019-10-19 ENCOUNTER — Encounter: Payer: Self-pay | Admitting: Critical Care Medicine

## 2019-10-19 ENCOUNTER — Other Ambulatory Visit: Payer: Self-pay

## 2019-10-19 DIAGNOSIS — F250 Schizoaffective disorder, bipolar type: Secondary | ICD-10-CM

## 2019-10-19 DIAGNOSIS — I2694 Multiple subsegmental pulmonary emboli without acute cor pulmonale: Secondary | ICD-10-CM

## 2019-10-19 DIAGNOSIS — M5126 Other intervertebral disc displacement, lumbar region: Secondary | ICD-10-CM

## 2019-10-19 DIAGNOSIS — M5416 Radiculopathy, lumbar region: Secondary | ICD-10-CM | POA: Insufficient documentation

## 2019-10-19 DIAGNOSIS — F1010 Alcohol abuse, uncomplicated: Secondary | ICD-10-CM

## 2019-10-19 DIAGNOSIS — R45851 Suicidal ideations: Secondary | ICD-10-CM | POA: Insufficient documentation

## 2019-10-19 DIAGNOSIS — F3131 Bipolar disorder, current episode depressed, mild: Secondary | ICD-10-CM

## 2019-10-19 MED ORDER — DULOXETINE HCL 60 MG PO CPEP: 60.0000 mg | ORAL_CAPSULE | Freq: Every day | ORAL | 3 refills | Status: DC

## 2019-10-19 MED ORDER — QUETIAPINE FUMARATE 200 MG PO TABS: 200.0000 mg | ORAL_TABLET | Freq: Every day | ORAL | 0 refills | Status: DC

## 2019-10-19 MED ORDER — DIVALPROEX SODIUM 500 MG PO DR TAB: 500.0000 mg | DELAYED_RELEASE_TABLET | Freq: Two times a day (BID) | ORAL | 3 refills | Status: DC

## 2019-10-19 MED ORDER — OXYCODONE HCL 15 MG PO TABS: 15.0000 mg | ORAL_TABLET | Freq: Four times a day (QID) | ORAL | 0 refills | Status: DC | PRN

## 2019-10-19 MED ORDER — NALOXONE HCL 4 MG/0.1ML NA LIQD: NASAL | 0 refills | Status: DC

## 2019-10-19 MED FILL — QUETIAPINE FUMARATE 200 MG: 200 | 30 days supply | Qty: 30 | Fill #0

## 2019-10-19 MED FILL — DULoxetine HCL 60 MG CPEP: 60 | 30 days supply | Qty: 30 | Fill #0

## 2019-10-19 MED FILL — DIVALPROEX SOD DR 500 MG TA: 500 | 30 days supply | Qty: 60 | Fill #0

## 2019-10-19 NOTE — Assessment & Plan Note (Signed)
History of pulmonary emboli for now would like to keep the Xarelto on board at 20 mg daily it can be held however for 3 to 4 days prior to any planned neurosurgical injection or procedure

## 2019-10-19 NOTE — Assessment & Plan Note (Addendum)
Acute on chronic lumbar radiculopathy with MRI showing protrusion of lumbar intervertebral disc  The patient was offered a steroid injection at orthopedics however the patient could not obtain this as apparently Workmen's Comp. did not cover this  The patient would like a second opinion I had like to refer him to Dr. Erline Levine of neurosurgery who has seen her patients in the past to see if he can help this patient with his lumbar pain and review his MRI  I will refill the oxycodone at 15 mg every 6 hours as needed I given him 40 of these  I did check the [facility] drug database and all of the prior prescriptions are in the system and no other prescribers are seen  The patient has signed a pain contract in the past and his drug screen is negative as well a Narcan prescription was given with this prescription

## 2019-10-19 NOTE — Patient Instructions (Signed)
Back Exercises:   Back Exercises These exercises help to make your trunk and back strong. They also help to keep the lower back flexible. Doing these exercises can help to prevent back pain or lessen existing pain.  If you have back pain, try to do these exercises 2-3 times each day or as told by your doctor.  As you get better, do the exercises once each day. Repeat the exercises more often as told by your doctor.  To stop back pain from coming back, do the exercises once each day, or as told by your doctor. Exercises Single knee to chest Do these steps 3-5 times in a row for each leg: 1. Lie on your back on a firm bed or the floor with your legs stretched out. 2. Bring one knee to your chest. 3. Grab your knee or thigh with both hands and hold them it in place. 4. Pull on your knee until you feel a gentle stretch in your lower back or buttocks. 5. Keep doing the stretch for 10-30 seconds. 6. Slowly let go of your leg and straighten it. Pelvic tilt Do these steps 5-10 times in a row: 1. Lie on your back on a firm bed or the floor with your legs stretched out. 2. Bend your knees so they point up to the ceiling. Your feet should be flat on the floor. 3. Tighten your lower belly (abdomen) muscles to press your lower back against the floor. This will make your tailbone point up to the ceiling instead of pointing down to your feet or the floor. 4. Stay in this position for 5-10 seconds while you gently tighten your muscles and breathe evenly. Cat-cow Do these steps until your lower back bends more easily: 1. Get on your hands and knees on a firm surface. Keep your hands under your shoulders, and keep your knees under your hips. You may put padding under your knees. 2. Let your head hang down toward your chest. Tighten (contract) the muscles in your belly. Point your tailbone toward the floor so your lower back becomes rounded like the back of a cat. 3. Stay in this position for 5  seconds. 4. Slowly lift your head. Let the muscles of your belly relax. Point your tailbone up toward the ceiling so your back forms a sagging arch like the back of a cow. 5. Stay in this position for 5 seconds.  Press-ups Do these steps 5-10 times in a row: 1. Lie on your belly (face-down) on the floor. 2. Place your hands near your head, about shoulder-width apart. 3. While you keep your back relaxed and keep your hips on the floor, slowly straighten your arms to raise the top half of your body and lift your shoulders. Do not use your back muscles. You may change where you place your hands in order to make yourself more comfortable. 4. Stay in this position for 5 seconds. 5. Slowly return to lying flat on the floor.  Bridges Do these steps 10 times in a row: 1. Lie on your back on a firm surface. 2. Bend your knees so they point up to the ceiling. Your feet should be flat on the floor. Your arms should be flat at your sides, next to your body. 3. Tighten your butt muscles and lift your butt off the floor until your waist is almost as high as your knees. If you do not feel the muscles working in your butt and the back of your thighs, slide  your feet 1-2 inches farther away from your butt. 4. Stay in this position for 3-5 seconds. 5. Slowly lower your butt to the floor, and let your butt muscles relax. If this exercise is too easy, try doing it with your arms crossed over your chest. Belly crunches Do these steps 5-10 times in a row: 1. Lie on your back on a firm bed or the floor with your legs stretched out. 2. Bend your knees so they point up to the ceiling. Your feet should be flat on the floor. 3. Cross your arms over your chest. 4. Tip your chin a little bit toward your chest but do not bend your neck. 5. Tighten your belly muscles and slowly raise your chest just enough to lift your shoulder blades a tiny bit off of the floor. Avoid raising your body higher than that, because it can  put too much stress on your low back. 6. Slowly lower your chest and your head to the floor. Back lifts Do these steps 5-10 times in a row: 1. Lie on your belly (face-down) with your arms at your sides, and rest your forehead on the floor. 2. Tighten the muscles in your legs and your butt. 3. Slowly lift your chest off of the floor while you keep your hips on the floor. Keep the back of your head in line with the curve in your back. Look at the floor while you do this. 4. Stay in this position for 3-5 seconds. 5. Slowly lower your chest and your face to the floor. Contact a doctor if:  Your back pain gets a lot worse when you do an exercise.  Your back pain does not get better 2 hours after you exercise. If you have any of these problems, stop doing the exercises. Do not do them again unless your doctor says it is okay. Get help right away if:  You have sudden, very bad back pain. If this happens, stop doing the exercises. Do not do them again unless your doctor says it is okay. This information is not intended to replace advice given to you by your health care provider. Make sure you discuss any questions you have with your health care provider. Document Revised: 12/25/2017 Document Reviewed: 12/25/2017 Elsevier Patient Education  2020 [employer].

## 2019-10-19 NOTE — Assessment & Plan Note (Signed)
Alcohol abuse which the patient states was on the basis of severe depression and anxiety he currently states he is not drinking at this time

## 2019-10-19 NOTE — Assessment & Plan Note (Signed)
As per radiculopathy assessment

## 2019-10-19 NOTE — Assessment & Plan Note (Signed)
Schizoaffective disorder with bipolar type again will obtain Seroquel and Depakote and discontinue further Zyprexa  I will refill the Cymbalta

## 2019-10-19 NOTE — Assessment & Plan Note (Addendum)
Bipolar disorder with ongoing depression now on Depakote and Seroquel however he cannot afford these medications  I will provide the Depakote and Seroquel for the patient by way of medication assistance program at the [facility] outpatient pharmacy  Note since Zyprexa has been discontinued I will discontinue Cogentin

## 2019-10-19 NOTE — Assessment & Plan Note (Signed)
The patient was cleared for discharge from the emergency room over the weekend and currently is not suicidal on my exam he does have follow-up with psychiatry

## 2019-10-20 ENCOUNTER — Telehealth (INDEPENDENT_AMBULATORY_CARE_PROVIDER_SITE_OTHER): Payer: No Payment, Other | Admitting: Psychiatric/Mental Health

## 2019-10-20 ENCOUNTER — Other Ambulatory Visit: Payer: Self-pay | Admitting: Critical Care Medicine

## 2019-10-20 ENCOUNTER — Encounter (HOSPITAL_COMMUNITY): Payer: Self-pay | Admitting: Psychiatric/Mental Health

## 2019-10-20 DIAGNOSIS — F319 Bipolar disorder, unspecified: Secondary | ICD-10-CM | POA: Diagnosis not present

## 2019-10-20 DIAGNOSIS — M5416 Radiculopathy, lumbar region: Secondary | ICD-10-CM

## 2019-10-20 DIAGNOSIS — F1010 Alcohol abuse, uncomplicated: Secondary | ICD-10-CM

## 2019-10-20 DIAGNOSIS — F4310 Post-traumatic stress disorder, unspecified: Secondary | ICD-10-CM

## 2019-10-20 DIAGNOSIS — F331 Major depressive disorder, recurrent, moderate: Secondary | ICD-10-CM

## 2019-10-20 DIAGNOSIS — F250 Schizoaffective disorder, bipolar type: Secondary | ICD-10-CM | POA: Diagnosis not present

## 2019-10-20 DIAGNOSIS — F411 Generalized anxiety disorder: Secondary | ICD-10-CM

## 2019-10-20 DIAGNOSIS — K50919 Crohn's disease, unspecified, with unspecified complications: Secondary | ICD-10-CM

## 2019-10-20 DIAGNOSIS — F329 Major depressive disorder, single episode, unspecified: Secondary | ICD-10-CM | POA: Insufficient documentation

## 2019-10-20 DIAGNOSIS — G894 Chronic pain syndrome: Secondary | ICD-10-CM

## 2019-10-20 MED ORDER — HYDROXYZINE HCL 25 MG PO TABS: 25.0000 mg | ORAL_TABLET | Freq: Three times a day (TID) | ORAL | 3 refills | Status: DC

## 2019-10-20 MED FILL — hydrOXYzine HCL 25 MG TABS: 25 | 30 days supply | Qty: 90 | Fill #0

## 2019-10-20 NOTE — Progress Notes (Signed)
Psychiatric Initial Adult Assessment  Virtual Visit via Video Note  I connected with  Juan Hester  on 10/20/19  by a video enabled telemedicine application and verified that I am speaking with the correct person using two identifiers.  Location: Patient: Home Provider: Home office  I discussed the limitations of evaluation and management by telemedicine and the availability of in person appointments. The patient expressed understanding and agreed to proceed.   I provided 45 minutes of non-face-to-face time during this encounter.  Patient Identification: [name] MRN:  [id number] Date of Evaluation:  10/24/2019 Referral Source: [facility] Chief Complaint:  "Im not doing to well" Visit Diagnosis:    ICD-10-CM   1. GAD (generalized anxiety disorder)  F41.1 hydrOXYzine (ATARAX/VISTARIL) 25 MG tablet  2. Bipolar 1 disorder (HCC)  F31.9   3. Schizoaffective disorder, bipolar type ([place])  F25.0   4. Moderate episode of recurrent major depressive disorder (HCC)  F33.1   5. PTSD (post-traumatic stress disorder)  F43.10     History of Present Illness: Juan Hester is a 39 year-old male seen today for initial psych evaluation.  Patient was referred to outpatient psychiatry by [facility] for medication management. On assessment Juan Hester reveals that he has not been taking his medication due to affordability.  He states that his PCP has set him up with the program that he can get his medical prescriptions for free.  He states that at this time his PCP has agreed to prescribe his psych medications.  Currently his psych medications are Seroquel 200 mg taken at bedtime Depakote 500 mg twice daily, and Cymbalta 60 mg daily.  His PCP has already prescribes his medication for the next 3 months.  At this time patient states his inability to focus and also he states constant anxiety.  Writer has suggested a trial of hydroxyzine 25 mg to be taken 3 times daily for anxiety as a solution to manage symptoms of anxiety.  Patient  agrees to try trial of hydroxyzine.  Patient was admitted to the hospital on July 2 for suicidal ideations.  He was discharged on July 3.  At the time of hospitalization patient was intoxicated and stated that he was going to commit suicide by overdose however he did admit that he did not have the means to do so.  Patient has a number of suicide attempts ranging from 2017-2018 with the last attempt being overdosed on pills and alcohol.  1 attempt was by firearm however the gun misfired.  At this time he does contract for safety he denies suicidal ideation he denies homicidal ideation as he has restarted his meds recently.  [facility] and patient was informed that he can also receive his psychiatric meds from [facility] in addition to his medical medication.  Patient has agreed to follow-up with writer for the next 12 weeks and has agreed to have writer write the prescriptions for his psychiatric medications from that point on.  Patient has agreed to remain on his current medication regimen no further concerns at this time.  Associated Signs/Symptoms: Depression Symptoms:  depressed mood, (Hypo) Manic Symptoms:  Elevated Mood, Anxiety Symptoms:  Social Anxiety, Psychotic Symptoms:  Paranoia, PTSD Symptoms: NA  Past Psychiatric History: MDD substance abuse   Previous Psychotropic Medications: Yes   Substance Abuse History in the last 12 months:  Yes.    Consequences of Substance Abuse: Medical Consequences:  hospitalization  Past Medical History:  Past Medical History:  Diagnosis Date  . Crohn disease ([place])   . PE (pulmonary thromboembolism) ([place])   . Thyroid disease     Past Surgical History:  Procedure Laterality Date  . ABDOMINAL SURGERY    . COLON SURGERY    . IR RADIOLOGIST EVAL & MGMT  08/31/2019  . SMALL INTESTINE SURGERY      Family Psychiatric History: unknown  Family History: History reviewed. No  pertinent family history.  Social History:   Social History   Socioeconomic History  . Marital status: Divorced    Spouse name: Not on file  . Number of children: Not on file  . Years of education: Not on file  . Highest education level: Not on file  Occupational History  . Not on file  Tobacco Use  . Smoking status: Current Every Day Smoker    Packs/day: 1.00  . Smokeless tobacco: Never Used  Vaping Use  . Vaping Use: Never used  Substance and Sexual Activity  . Alcohol use: Yes  . Drug use: Not Currently  . Sexual activity: Yes  Other Topics Concern  . Not on file  Social History Narrative  . Not on file   Social Determinants of Health   Financial Resource Strain:   . Difficulty of Paying Living Expenses:   Food Insecurity:   . Worried About Charity fundraiser in the Last Year:   . Arboriculturist in the Last Year:   Transportation Needs:   . Film/video editor (Medical):   Marland Kitchen Lack of Transportation (Non-Medical):   Physical Activity:   . Days of Exercise per Week:   . Minutes of Exercise per Session:   Stress:   . Feeling of Stress :   Social Connections:   . Frequency of Communication with Friends and Family:   . Frequency of Social Gatherings with Friends and Family:   . Attends Religious Services:   . Active Member of Clubs or Organizations:   . Attends Archivist Meetings:   Marland Kitchen Marital Status:     Additional Social History: unknown  Allergies:   Allergies  Allergen Reactions  . Azathioprine Other (See Comments)    Pt does not recall reaction    . Ciprofloxacin Other (See Comments)    Pt does not recall reaction    . Mesalamine Other (See Comments)    Pt does not recall reaction  . Metronidazole Other (See Comments)    Pt does not recall reaction   . Sulfa Antibiotics Other (See Comments)    Pt does not recall reaction   . Barium Sulfate Rash  . Barium-Containing Compounds Rash    Metabolic Disorder Labs: Lab Results   Component Value Date   HGBA1C 5.0 05/05/2019   No results found for: PROLACTIN Lab Results  Component Value Date   CHOL 155 05/05/2019   TRIG 202 (H) 05/05/2019   HDL 55 05/05/2019   CHOLHDL 2.8 05/05/2019   LDLCALC 67 05/05/2019   Lab Results  Component Value Date   TSH 0.659 08/18/2019    Therapeutic Level Labs: No results found for: LITHIUM No results found for: CBMZ No results found for: VALPROATE  Current Medications: Current Outpatient Medications  Medication Sig Dispense Refill  . divalproex (DEPAKOTE) 500 MG DR tablet Take 1 tablet (500 mg total) by mouth 2 (two) times daily. 60 tablet 3  . DULoxetine (CYMBALTA) 60 MG capsule Take 1 capsule (60 mg total) by mouth daily. 30 capsule 3  . hydrOXYzine (ATARAX/VISTARIL)  25 MG tablet Take 1 tablet (25 mg total) by mouth 3 (three) times daily. 90 tablet 3  . naloxone (NARCAN) nasal spray 4 mg/0.1 mL Use as needed for overdose symptoms from opiate 1 each 0  . oxyCODONE (ROXICODONE) 15 MG immediate release tablet Take 1 tablet (15 mg total) by mouth every 6 (six) hours as needed for pain. 40 tablet 0  . QUEtiapine (SEROQUEL) 200 MG tablet Take 1 tablet (200 mg total) by mouth at bedtime. 30 tablet 0  . rivaroxaban (XARELTO) 20 MG TABS tablet Take 1 tablet (20 mg total) by mouth daily with supper. 30 tablet 4   No current facility-administered medications for this visit.    Musculoskeletal: Strength & Muscle Tone: within normal limits Gait & Station: normal Patient leans: N/A  Psychiatric Specialty Exam: Review of Systems  Psychiatric/Behavioral: Positive for dysphoric mood and suicidal ideas. Negative for hallucinations. The patient is nervous/anxious. The patient is not hyperactive.   All other systems reviewed and are negative.   There were no vitals taken for this visit.There is no height or weight on file to calculate BMI.  General Appearance: Casual  Eye Contact:  Good  Speech:  Clear and Coherent  Volume:   Normal  Mood:  NA  Affect:  Appropriate and Congruent  Thought Process:  Coherent and Descriptions of Associations: Intact  Orientation:  Full (Time, Place, and Person)  Thought Content:  WDL and Logical  Suicidal Thoughts:  No  Homicidal Thoughts:  No  Memory:  Immediate;   Fair  Judgement:  Intact  Insight:  Fair  Psychomotor Activity:  Normal  Concentration:  Concentration: Fair  Recall:  Good  Fund of Knowledge:Good  Language: Good  Akathisia:  NA  Handed:  Right  AIMS (if indicated):  not done  Assets:  Communication Skills Desire for Improvement Physical Health Social Support  ADL's:  Intact  Cognition: WNL  Sleep:  Poor   Screenings: GAD-7     Office Visit from 09/01/2019 in [place] Office Visit from 08/18/2019 in [place] Office Visit from 07/21/2019 in [place] Office Visit from 06/02/2019 in [place]  Total GAD-7 Score 21 16 14 12     PHQ2-9     Office Visit from 09/01/2019 in [place] Office Visit from 08/18/2019 in [place] Office Visit from 07/21/2019 in [place] Office Visit from 06/02/2019 in [place] Office Visit from 05/05/2019 in [place]  PHQ-2 Total Score 4 2 2 1  0  PHQ-9 Total Score 15 9 9 10  --      Assessment and Plan: Patient has agreed to continue with his Seroquel 200 mg to be taken at bedtime, Depakote 500 mg 2 times a day, and Cymbalta 60 mg daily.  Writer has also added hydroxyzine 25 mg to be taken 3 times a day as needed for anxiety.  Patient has agreed to continue with current medication regimen with the addition of hydroxyzine to manage anxiety symptoms.  Patient also agreed to follow-up in 12 weeks.      1. GAD (generalized anxiety disorder)  F41.1 hydrOXYzine  (ATARAX/VISTARIL) 25 MG tablet  2. Bipolar 1 disorder (HCC)  F31.9   3. Schizoaffective disorder, bipolar type ([place])  F25.0   4. Moderate episode of recurrent major depressive disorder (  [place])  F33.1   5. PTSD (post-traumatic stress disorder)  F43.10     Deloria Lair, NP 7/11/202111:20 PM

## 2019-10-26 ENCOUNTER — Ambulatory Visit: Payer: Self-pay | Attending: Critical Care Medicine

## 2019-10-26 ENCOUNTER — Other Ambulatory Visit: Payer: Self-pay

## 2019-10-26 DIAGNOSIS — K50919 Crohn's disease, unspecified, with unspecified complications: Secondary | ICD-10-CM

## 2019-10-26 DIAGNOSIS — G894 Chronic pain syndrome: Secondary | ICD-10-CM

## 2019-10-26 DIAGNOSIS — M5416 Radiculopathy, lumbar region: Secondary | ICD-10-CM

## 2019-10-27 LAB — HEPATIC FUNCTION PANEL
ALT: 80 IU/L — ABNORMAL HIGH (ref 0–44)
AST: 56 IU/L — ABNORMAL HIGH (ref 0–40)
Albumin: 3.9 g/dL — ABNORMAL LOW (ref 4.0–5.0)
Alkaline Phosphatase: 192 IU/L — ABNORMAL HIGH (ref 48–121)
Bilirubin Total: 0.8 mg/dL (ref 0.0–1.2)
Bilirubin, Direct: 0.29 mg/dL (ref 0.00–0.40)
Total Protein: 7 g/dL (ref 6.0–8.5)

## 2019-10-27 LAB — URINALYSIS
Bilirubin, UA: NEGATIVE
Glucose, UA: NEGATIVE
Ketones, UA: NEGATIVE
Leukocytes,UA: NEGATIVE
Nitrite, UA: NEGATIVE
RBC, UA: NEGATIVE
Specific Gravity, UA: 1.022 (ref 1.005–1.030)
Urobilinogen, Ur: 0.2 mg/dL (ref 0.2–1.0)
pH, UA: 6 (ref 5.0–7.5)

## 2019-10-27 NOTE — Telephone Encounter (Signed)
Scheduled for 11/01/2019 as telephone appt

## 2019-10-28 ENCOUNTER — Ambulatory Visit (INDEPENDENT_AMBULATORY_CARE_PROVIDER_SITE_OTHER): Payer: No Payment, Other | Admitting: Licensed Clinical Social Worker

## 2019-10-28 ENCOUNTER — Other Ambulatory Visit: Payer: Self-pay

## 2019-10-28 DIAGNOSIS — F3160 Bipolar disorder, current episode mixed, unspecified: Secondary | ICD-10-CM

## 2019-10-29 NOTE — Progress Notes (Signed)
   THERAPIST PROGRESS NOTE  Session Time: 44 min  Participation Level: Active  Behavioral Response: CasualAlertNegative and Irritable  Type of Therapy: Individual Therapy  Treatment Goals addressed: Anxiety and Coping  Interventions: Supportive and Other: Assessment  Summary: Juan Hester is a 39 y.o. male who presents with hx of Anx/Dep. Pt presents today as anxious and irritable. LCSW reviewed last contact with pt calling this clinician's private practice phone he must have gotten online and leaving a vm. LCSW asked pt to get pen and paper. Provided phone and address of [facility] with review of services including 24/7 crisis unit. Pt writes info down and verbalizes understanding of how to contact this clinician as needed. LCSW assessed for pt's recent trip to ED for suicidal ideation. He states "There is nothing to tell". Additional exploration reveals pt was IVC and per him held for 36 hrs before being released. He was not pleased with experience. When asked about meds he becomes quite angry with profane language talking about med management appt here and being given "an antihistamine". States he wasted his money and threw meds in the trash. He is angry he cannot get what he thinks he needs to manage extreme panic attacks. Reports he went to see Dr. Joya Gaskins at [facility] where he got prescriptions for Seroquel, Depakote and Cymbalta. He is sleeping better but feels panic attacks are still not controlled. He states the only thing that has stopped his panic attacks is using cannabis, which he refuses to use in [place] as it is not legal here. Explored what brought him to [facility]. He states to be near fam. Explored fam relationships/support, which are described to be limited. Has not spoken to his mother since March and has disparaging comments about her. Asked if pt has thought about moving to another state as an option. Pt says he has goal of moving back out toward [place] at some point saying  he also does not do well with the heat/humidity of [place]. Assessed for other worries/concerns/suicidality. Pt reports his thoughts about self harm are "50/50". He denies intent or plan. Again mentioned 24/7 crisis center. Pt stressed by finances and states he lost his second job d/t panic attacks at work so he now has no income. Reports his fam would not help financially. Confirms rent paid until Sep. He is actively looking for employment. Assessed for food stamps which pt states he has. Pt continues to cope with panic by enduring until symptoms subside. Pt used to be able to be distracted by electronic games but "I lost interest". He does not seem willing to retry this distraction. He is again very vocal about not mentioning any "meditation or zen crap" to him. LCSW reviewed poc with pt's verbal understanding prior to end of session.     Suicidal/Homicidal: Yeswithout intent/plan  Therapist Response: Pt irritable and poorly cooperative.  Plan: Return again in 2 weeks.  Diagnosis: Axis I: Anxiety with Depression    Axis II: Deferred  Hermine Messick, LCSW 10/29/2019

## 2019-11-01 ENCOUNTER — Encounter: Payer: Self-pay | Admitting: Critical Care Medicine

## 2019-11-01 ENCOUNTER — Ambulatory Visit: Payer: Self-pay | Attending: Critical Care Medicine | Admitting: Critical Care Medicine

## 2019-11-01 ENCOUNTER — Ambulatory Visit (INDEPENDENT_AMBULATORY_CARE_PROVIDER_SITE_OTHER)
Admission: RE | Admit: 2019-11-01 | Discharge: 2019-11-01 | Disposition: A | Discharge status: Home / Self Care | Payer: Self-pay | Source: Ambulatory Visit

## 2019-11-01 ENCOUNTER — Other Ambulatory Visit: Payer: Self-pay

## 2019-11-01 DIAGNOSIS — I2694 Multiple subsegmental pulmonary emboli without acute cor pulmonale: Secondary | ICD-10-CM

## 2019-11-01 DIAGNOSIS — M5416 Radiculopathy, lumbar region: Secondary | ICD-10-CM

## 2019-11-01 DIAGNOSIS — R45851 Suicidal ideations: Secondary | ICD-10-CM

## 2019-11-01 DIAGNOSIS — F410 Panic disorder [episodic paroxysmal anxiety] without agoraphobia: Secondary | ICD-10-CM

## 2019-11-01 DIAGNOSIS — F411 Generalized anxiety disorder: Secondary | ICD-10-CM

## 2019-11-01 DIAGNOSIS — F1010 Alcohol abuse, uncomplicated: Secondary | ICD-10-CM

## 2019-11-01 DIAGNOSIS — F319 Bipolar disorder, unspecified: Secondary | ICD-10-CM

## 2019-11-01 DIAGNOSIS — R079 Chest pain, unspecified: Secondary | ICD-10-CM

## 2019-11-01 NOTE — Assessment & Plan Note (Signed)
Continue Xarelto for now

## 2019-11-01 NOTE — Progress Notes (Signed)
Subjective:    Patient ID: Juan Hester, male    DOB: November 01, 1980, 39 y.o.   MRN: [id number] Virtual Visit via Video Note  I connected with@ on 11/01/19 at@ by a video enabled telemedicine application and verified that I am speaking with the correct person using two identifiers.   Consent:  I discussed the limitations, risks, security and privacy concerns of performing an evaluation and management service by video visit and the availability of in person appointments. I also discussed with the patient that there may be a patient responsible charge related to this service. The patient expressed understanding and agreed to proceed.  Location of patient: pt at home  Location of provider: I am in my office   Persons participating in the televisit with the patient.   No one else on the call    History of Present Illness:   This is a complex 39 year old male who just arrived here from the [place] area and currently is living with relatives who presents for evaluation of pulmonary embolism history, Crohn's disease, history of colon carcinoma status post sigmoid resection with colostomy formation, and need for paperwork regarding patient's work status.  The patient currently is living in a hotel.  Patient arrived here in December.  The patient in [place] was under care by gastroenterologist was on Remicade for Crohn's disease and also being followed for his oncology status.  He is due for a colonoscopy at this time.  He works at [employer] he has history of bipolar disorder and had previously been on Cogentin Zyprexa, Depakote and Cymbalta.  Patient is also had major depression as well in the past.  The patient states he has significant volume output from his colostomy with a diarrheal type syndrome and he does have Imodium for this.  The patient has abdominal pain at times.  He has chronic fatigue.  He cannot stand and one place for prolonged periods of time.  He fell at work and his lower back  has been difficult for him since that fall.  He was in the emergency room and underwent CT scan of the head and this was negative.  The patient is needing refills on his medications and he does not have insurance at this time.  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  The patient states his back pain is worsening.  He did fall at work and March.  He states the pain was tolerable but now markedly worse if he stands for long periods of time.  He was not able to use a Lidoderm patch as he cannot reach the center part of his back.  He notes pain shooting down both legs.  He has some difficulty walking as well at this time.  There has not been any lumbar imaging studies done although when he went to the emergency room previously there was a CT scan of the abdomen was done and did not show changes in the lumbar spine but this was a limited study  Patient states his cyclobenzaprine is not useful but he is not taking it on a regular basis The patient has no change in his breathing status and is maintaining his  anticoagulant for history of pulmonary emboli.  The patient is yet to achieve a gastroenterology appointment as he needs the orange card first.  He has his financial paperwork together so make an appointment for the financial counselor  08/18/2019 The patient comes in with lower extremity edema and is just gone to the ER 1 day ago for progressive back pain which occurred on the job.  He has since been let go from the job he was working.  He has another job as well he works.  The patient is yet to receive his lumbar x-rays as ordered.  He does not have financial assistance as of yet to be able to achieve an  MRI or orthopedics appointment.  Patient notes increased edema in the lower extremities and some shortness of breath with exertion. Note I reviewed his CT scan of the chest March which did not show pulmonary emboli but did show atelectasis at the bases of the lungs.   Wt Readings from Last 3 Encounters: 08/18/19 : 274 lb (124.3 kg) 07/21/19 : 264 lb 3.2 oz (119.8 kg) 07/11/19 : 260 lb (117.9 kg)  Patient cannot sleep flat is still smoking half pack a day of cigarettes denies wheezing slight productive cough of green mucus no sinus complaints  09/01/2019 This patient is seen in return follow-up and has been to the emergency room twice since I last saw him several weeks ago.  Patient has continued pain in the lower back.  He has a normal lumbar study.  He had fallen at work.  He is now been fired from his job.  He has retained an attorney for [employer].  The patient states his pain is more in the upper thoracic area as well as in the lumbar area at this time.  Note he also went to interventional radiology they recommend not starting in this patient with an in fear vena cava filter but instead study his lower extremities first.  He is yet to have a vascular study the lower extremities for DVT.  He is now off Xarelto because he was bleeding from an opening of the rectal closure from his previous total colectomy.  The patient still has some difficulty in that area but the bleeding is less off the Xarelto.  Patient is also seeing gastroenterology and they are awaiting further evaluation of the rectal tear.  The patient is yet to see a surgeon for this.  He is here today for follow-up.  10/19/2019 This patient is seen in return follow-up and notes continued low back pain with radiculopathy down the left lower extremity.  He saw orthopedics and they recommended an injection however they said that they could not perform it under his Workmen's Comp. agreement.  He is unable to afford any  type of procedure as he is self-pay at this time.  He is also wishing a second opinion on his back.  In addition to this he has had increased depression and mood swings and this ultimately resulted in increased alcohol use when he was with a friend over the weekend and when the friend left him he became extremely despondent and started thinking of suicidal ideation.  He called for help and was brought to the emergency room where he had a 36-hour stay in involuntary commitment until he can be cleared by psychiatry which did occurred he was just discharged 24 hours ago.  He was given a paper prescription for Seroquel and also for his Depakote however he cannot afford these  medications at this time.  He was also not given a refill on Cymbalta and has run out of the Cymbalta.  He also has run out of his oxycodone as well for his back pain.  He does have a supply of Xarelto and maintains this 20 mg daily without any additional bleeding noted in the rectal area.  When he came to the ER he was very much confused had been drinking excessively and somewhat altered he did clear this out and he has not had further drinking since that time.  He states when he uses the oxycodone he only takes it about 3-4 times daily.  He states the cyclobenzaprine was of no use to him.  He has had several falls because of left-sided weakness in the lower extremity.  He feels like there is electric shock going down his leg and this resulted in the falls.  He has bruised his left shoulder with an injury and a fall in his bathroom. He was unable to obtain transportation therefore this visit was done via video Note the psychiatry doctor did discontinue his Zyprexa in favor of use of Seroquel 200 mg daily.  He does have follow-up with a behavioral therapist and psychiatrist in the next 24 hours [facility].  11/01/2019 This is a video visit and follow-up from her previous video visit several weeks ago.   The patient attempted to get to see neurosurgery but they were going to charge her co-pay he was not able to achieve that visit.  The patient is no longer drinking alcohol regularly.  He still has significant anxiety.  He did see the mental health providers at the clinic and they prescribed hydroxyzine for anxiety and this has not helped his symptoms.  He is wishing to get back on the some type of benzodiazepine.  He is seeing a Social worker at this time.  He denies any bleeding from the rectal area.  He still has low back pain.  He is using the oxycodone as needed for pain and only minimally using this.  He has called his lawyer deceiving get Workmen's Comp. to cover the neurosurgical evaluation.  Past Medical History:  Diagnosis Date  . Crohn disease ([place])   . PE (pulmonary thromboembolism) ([place])   . Thyroid disease      No family history on file.   Social History   Socioeconomic History  . Marital status: Divorced    Spouse name: Not on file  . Number of children: Not on file  . Years of education: Not on file  . Highest education level: Not on file  Occupational History  . Not on file  Tobacco Use  . Smoking status: Current Every Day Smoker    Packs/day: 1.00  . Smokeless tobacco: Never Used  Vaping Use  . Vaping Use: Never used  Substance and Sexual Activity  . Alcohol use: Yes  . Drug use: Not Currently  . Sexual activity: Yes  Other Topics Concern  . Not on file  Social History Narrative  . Not on file   Social Determinants of Health   Financial Resource Strain:   . Difficulty of Paying Living Expenses:   Food Insecurity:   . Worried About Charity fundraiser in the Last Year:   . Arboriculturist in the Last Year:   Transportation Needs:   . Film/video editor (Medical):   Marland Kitchen Lack of Transportation (Non-Medical):   Physical Activity:   .  Days of Exercise per Week:   . Minutes of Exercise per Session:   Stress:   . Feeling of Stress :   Social Connections:     . Frequency of Communication with Friends and Family:   . Frequency of Social Gatherings with Friends and Family:   . Attends Religious Services:   . Active Member of Clubs or Organizations:   . Attends Archivist Meetings:   Marland Kitchen Marital Status:   Intimate Partner Violence:   . Fear of Current or Ex-Partner:   . Emotionally Abused:   Marland Kitchen Physically Abused:   . Sexually Abused:      Allergies  Allergen Reactions  . Azathioprine Other (See Comments)    Pt does not recall reaction    . Ciprofloxacin Other (See Comments)    Pt does not recall reaction    . Mesalamine Other (See Comments)    Pt does not recall reaction  . Metronidazole Other (See Comments)    Pt does not recall reaction   . Sulfa Antibiotics Other (See Comments)    Pt does not recall reaction   . Barium Sulfate Rash  . Barium-Containing Compounds Rash     Outpatient Medications Prior to Visit  Medication Sig Dispense Refill  . divalproex (DEPAKOTE) 500 MG DR tablet Take 1 tablet (500 mg total) by mouth 2 (two) times daily. 60 tablet 3  . DULoxetine (CYMBALTA) 60 MG capsule Take 1 capsule (60 mg total) by mouth daily. 30 capsule 3  . hydrOXYzine (ATARAX/VISTARIL) 25 MG tablet Take 1 tablet (25 mg total) by mouth 3 (three) times daily. 90 tablet 3  . naloxone (NARCAN) nasal spray 4 mg/0.1 mL Use as needed for overdose symptoms from opiate 1 each 0  . oxyCODONE (ROXICODONE) 15 MG immediate release tablet Take 1 tablet (15 mg total) by mouth every 6 (six) hours as needed for pain. 40 tablet 0  . QUEtiapine (SEROQUEL) 200 MG tablet Take 1 tablet (200 mg total) by mouth at bedtime. 30 tablet 0  . rivaroxaban (XARELTO) 20 MG TABS tablet Take 1 tablet (20 mg total) by mouth daily with supper. 30 tablet 4   No facility-administered medications prior to visit.      Review of Systems  Constitutional: Positive for activity change.  HENT: Negative.   Eyes: Negative.   Respiratory: Negative for cough, shortness  of breath and wheezing.   Cardiovascular: Negative for palpitations and leg swelling.  Gastrointestinal: Negative for abdominal distention, anal bleeding, blood in stool, constipation, diarrhea, nausea, rectal pain and vomiting.  Endocrine: Negative.   Genitourinary: Negative.   Musculoskeletal: Positive for gait problem.  Psychiatric/Behavioral: Positive for agitation, behavioral problems, decreased concentration, dysphoric mood, sleep disturbance and suicidal ideas. Negative for self-injury. The patient is nervous/anxious and is hyperactive.        Objective:   Physical Exam  There were no vitals filed for this visit.  No exam is this is a video visit however the patient is observed on the video visit to be calm and in no acute distress and is at his apartment     Assessment & Plan:  I personally reviewed all images and lab data in the [facility] system as well as any outside material available during this office visit and agree with the  radiology impressions.   GAD (generalized anxiety disorder) History of bipolar disorder with associated severe anxiety disorder  Patient is on the Seroquel and Depakote in hopes to stabilize his mood  Psychiatry wishing  to avoid benzodiazepines at this time.  History of alcohol use and suicidality suspect drove this decision which appears to be appropriate the patient states hydroxyzine is not improving his symptoms and I will ask psychiatry to see any other measures can be taken short of benzodiazepines in this patient    Alcohol abuse Patient states he is not drinking alcohol at this time I have asked him to follow-up with his counselor  Lumbar radiculopathy Lumbar radiculopathy I will see if the neurosurgeon will be willing to see the patient without a co-pay we will also see if we get Workmen's Comp. to cover  Multiple subsegmental pulmonary emboli without acute cor pulmonale ([place]) Continue Xarelto for now  Suicidal ideation No evidence of  active suicidality at this visit  Bipolar 1 disorder ([place]) Treatment per psychiatry at this time with Seroquel and Depakote   Boen was seen today for medication management.  Diagnoses and all orders for this visit:  GAD (generalized anxiety disorder)  Alcohol abuse  Lumbar radiculopathy  Multiple subsegmental pulmonary emboli without acute cor pulmonale ([facility])  Suicidal ideation  Bipolar 1 disorder ([facility])     Follow Up Instructions: The patient knows a follow-up visit will be made in 1 month we will also message psychiatry to see if any other measures can be taken   I discussed the assessment and treatment plan with the patient. The patient was provided an opportunity to ask questions and all were answered. The patient agreed with the plan and demonstrated an understanding of the instructions.   The patient was advised to call back or seek an in-person evaluation if the symptoms worsen or if the condition fails to improve as anticipated.  I provided 30 minutes of non-face-to-face time during this encounter  including  median intraservice time , review of notes, labs, imaging, medications  and explaining diagnosis and management to the patient .    Asencion Noble, MD

## 2019-11-01 NOTE — Assessment & Plan Note (Signed)
History of bipolar disorder with associated severe anxiety disorder  Patient is on the Seroquel and Depakote in hopes to stabilize his mood  Psychiatry wishing to avoid benzodiazepines at this time.  History of alcohol use and suicidality suspect drove this decision which appears to be appropriate the patient states hydroxyzine is not improving his symptoms and I will ask psychiatry to see any other measures can be taken short of benzodiazepines in this patient

## 2019-11-01 NOTE — Assessment & Plan Note (Signed)
Treatment per psychiatry at this time with Seroquel and Depakote

## 2019-11-01 NOTE — Assessment & Plan Note (Signed)
Lumbar radiculopathy I will see if the neurosurgeon will be willing to see the patient without a co-pay we will also see if we get Workmen's Comp. to cover

## 2019-11-01 NOTE — ED Provider Notes (Addendum)
Juan Hester  Virtual Visit via Video Note:  Juan Hester  initiated request for Telemedicine visit with [facility] team. I connected with Juan Hester  on 11/01/2019 at 4:02 PM  for a synchronized telemedicine visit using a video enabled HIPPA compliant telemedicine application. I verified that I am speaking with Juan Hester  using two identifiers. Bettey Mare, NP  was physically located in a [facility] Urgent care site and Juan Hester was located at a different location.   The limitations of evaluation and management by telemedicine as well as the availability of in-person appointments were discussed. Patient was informed that he  may incur a bill ( including co-pay) for this virtual visit encounter. Juan Hester  expressed understanding and gave verbal consent to proceed with virtual visit.   [id number] 11/01/19 Arrival Time: 69  CC: ANXIETY SUBJECTIVE: History from: patient.  Juan Hester is a 39 y.o. male who presents with complaint of ongoing panic attacks.  Reports that he lost his job for this 3 days ago.  Patient has a history of PTSD, MDD, GAD, suicidal ideation, nicotine dependence, schizoaffective disorder, alcohol abuse, bipolar affective disorder and bipolar 1.  Patient is currently being managed by [facility] as well as with Dr. Asencion Noble.  Upon chart review, patient has had a televisit with behavioral health on 10/28/2019 and had a telemedicine visit with Dr. Joya Gaskins today.  Per Dr. Bettina Gavia note, no benzos were prescribed under psychiatry's advice.  Patient is asking for benzos today.  Patient is also reporting chest pain and panic attacks are lasting about 30 minutes.  Patient reports that he is insulted that he was given hydroxyzine to help with anxiety, as he states "it is only an antihistamine."  Denies fever, chills, fatigue, ear pain, sinus pain, rhinorrhea, nasal congestion, cough, SOB, wheezing, chest pain, nausea,  rash, changes in bowel or bladder habits.    ROS: As per HPI.  All other pertinent ROS negative.     Past Medical History:  Diagnosis Date  . Crohn disease ([place])   . PE (pulmonary thromboembolism) ([place])   . Thyroid disease    Past Surgical History:  Procedure Laterality Date  . ABDOMINAL SURGERY    . COLON SURGERY    . IR RADIOLOGIST EVAL & MGMT  08/31/2019  . SMALL INTESTINE SURGERY     Allergies  Allergen Reactions  . Azathioprine Other (See Comments)    Pt does not recall reaction    . Ciprofloxacin Other (See Comments)    Pt does not recall reaction    . Mesalamine Other (See Comments)    Pt does not recall reaction  . Metronidazole Other (See Comments)    Pt does not recall reaction   . Sulfa Antibiotics Other (See Comments)    Pt does not recall reaction   . Barium Sulfate Rash  . Barium-Containing Compounds Rash   No current facility-administered medications on file prior to encounter.   Current Outpatient Medications on File Prior to Encounter  Medication Sig Dispense Refill  . divalproex (DEPAKOTE) 500 MG DR tablet Take 1 tablet (500 mg total) by mouth 2 (two) times daily. 60 tablet 3  . DULoxetine (CYMBALTA) 60 MG capsule Take 1 capsule (60 mg total) by mouth daily. 30 capsule 3  . hydrOXYzine (ATARAX/VISTARIL) 25 MG tablet Take 1 tablet (25 mg total) by mouth 3 (three) times daily. 90 tablet 3  . naloxone (NARCAN) nasal spray 4 mg/0.1 mL Use as needed for  overdose symptoms from opiate 1 each 0  . oxyCODONE (ROXICODONE) 15 MG immediate release tablet Take 1 tablet (15 mg total) by mouth every 6 (six) hours as needed for pain. 40 tablet 0  . QUEtiapine (SEROQUEL) 200 MG tablet Take 1 tablet (200 mg total) by mouth at bedtime. 30 tablet 0  . rivaroxaban (XARELTO) 20 MG TABS tablet Take 1 tablet (20 mg total) by mouth daily with supper. 30 tablet 4   Social History   Socioeconomic History  . Marital status: Divorced    Spouse name: Not on file  . Number of  children: Not on file  . Years of education: Not on file  . Highest education level: Not on file  Occupational History  . Not on file  Tobacco Use  . Smoking status: Current Every Day Smoker    Packs/day: 1.00  . Smokeless tobacco: Never Used  Vaping Use  . Vaping Use: Never used  Substance and Sexual Activity  . Alcohol use: Yes  . Drug use: Not Currently  . Sexual activity: Yes  Other Topics Concern  . Not on file  Social History Narrative  . Not on file   Social Determinants of Health   Financial Resource Strain:   . Difficulty of Paying Living Expenses:   Food Insecurity:   . Worried About Charity fundraiser in the Last Year:   . Arboriculturist in the Last Year:   Transportation Needs:   . Film/video editor (Medical):   Marland Kitchen Lack of Transportation (Non-Medical):   Physical Activity:   . Days of Exercise per Week:   . Minutes of Exercise per Session:   Stress:   . Feeling of Stress :   Social Connections:   . Frequency of Communication with Friends and Family:   . Frequency of Social Gatherings with Friends and Family:   . Attends Religious Services:   . Active Member of Clubs or Organizations:   . Attends Archivist Meetings:   Marland Kitchen Marital Status:   Intimate Partner Violence:   . Fear of Current or Ex-Partner:   . Emotionally Abused:   Marland Kitchen Physically Abused:   . Sexually Abused:    No family history on file.  OBJECTIVE:   There were no vitals filed for this visit.  General appearance: alert; no distress Eyes: EOMI grossly HENT: normocephalic; atraumatic Neck: supple with FROM Lungs: normal respiratory effort; speaking in full sentences without difficulty Extremities: moves extremities without difficulty Skin: No obvious rashes Neurologic: No facial asymmetries Psychological: alert and cooperative; normal mood and affect  ASSESSMENT & PLAN:  1. Chest pain, unspecified type   2. Panic attack     No orders of the defined types were  placed in this encounter.    Discussed with patient that managing his anxiety and panic attacks is not something that we can do in an urgent care setting He is being managed by psych, and he recently had a visit with them He also had a telemed visit with his primary care provider If these providers are not willing to prescribe benzos, I cannot overstep and prescribed them either Discussed with patient that it would be the most appropriate to follow-up with behavioral health as he is already an established patient Discussed my limitations and my ability to help him today in an urgent care setting over video visit It would be most appropriate for him to be seen in person by someone who was already following him  If symptoms are getting worse, he is instructed to go to the ER Take OTC ibuprofen or tylenol as needed for pain Follow up with PCP if symptoms persists Return or go to ER if patient has any new or worsening symptoms such as fever, chills, nausea, vomiting, worsening sore throat, cough, abdominal pain, chest pain, changes in bowel or bladder habits  I discussed the assessment and treatment plan with the patient. The patient was provided an opportunity to ask questions and all were answered. The patient agreed with the plan and demonstrated an understanding of the instructions.   The patient was advised to call back or seek an in-person evaluation if the symptoms worsen or if the condition fails to improve as anticipated.  I provided 20 minutes of non-face-to-face time during this encounter.  Bettey Mare, NP  11/01/2019 4:02 PM         Faustino Congress, NP 11/01/19 1558    Faustino Congress, NP 11/01/19 [phone]

## 2019-11-01 NOTE — Assessment & Plan Note (Signed)
Patient states he is not drinking alcohol at this time I have asked him to follow-up with his counselor

## 2019-11-01 NOTE — Assessment & Plan Note (Signed)
No evidence of active suicidality at this visit

## 2019-11-01 NOTE — Discharge Instructions (Signed)
Follow up with Psych  If symptoms are getting worse, I would have you go to the ER

## 2019-11-03 ENCOUNTER — Other Ambulatory Visit: Payer: Self-pay | Admitting: Critical Care Medicine

## 2019-11-03 MED ORDER — NAPROXEN 500 MG PO TABS
500.0000 mg | ORAL_TABLET | Freq: Two times a day (BID) | ORAL | 0 refills | Status: DC
Start: 2019-11-03 — End: 2019-12-15

## 2019-11-03 MED FILL — NAPROXEN 500 MG TABLET: 500 | 30 days supply | Qty: 60 | Fill #0

## 2019-11-03 NOTE — Progress Notes (Signed)
Refill on naprosyn

## 2019-11-04 LAB — DRUG SCREEN 764883 11+OXYCO+ALC+CRT-BUND
Amphetamines, Urine: NEGATIVE ng/mL
BENZODIAZ UR QL: NEGATIVE ng/mL
Barbiturate: NEGATIVE ng/mL
Cocaine (Metabolite): NEGATIVE ng/mL
Creatinine: 264.1 mg/dL (ref 20.0–300.0)
Ethanol: NEGATIVE %
Meperidine: NEGATIVE ng/mL
Methadone Screen, Urine: NEGATIVE ng/mL
OPIATE SCREEN URINE: NEGATIVE ng/mL
Oxycodone/Oxymorphone, Urine: NEGATIVE ng/mL
Phencyclidine: NEGATIVE ng/mL
Propoxyphene: NEGATIVE ng/mL
Tramadol: NEGATIVE ng/mL
pH, Urine: 6.3 (ref 4.5–8.9)

## 2019-11-04 LAB — CANNABINOID CONFIRMATION, UR
CANNABINOIDS: POSITIVE — AB
Carboxy THC GC/MS Conf: 60 ng/mL

## 2019-11-08 ENCOUNTER — Other Ambulatory Visit: Payer: Self-pay | Admitting: Critical Care Medicine

## 2019-11-08 ENCOUNTER — Telehealth: Payer: Self-pay | Admitting: Critical Care Medicine

## 2019-11-08 MED ORDER — OXYCODONE HCL 15 MG PO TABS: 15.0000 mg | ORAL_TABLET | Freq: Four times a day (QID) | ORAL | 0 refills | Status: DC | PRN

## 2019-11-08 MED FILL — oxyCODONE HCL 15 MG TABS: 15 | 10 days supply | Qty: 40 | Fill #0

## 2019-11-08 NOTE — Telephone Encounter (Signed)
Pt aware, Rx sent to his [facility].

## 2019-11-08 NOTE — Telephone Encounter (Signed)
oxyCODONE (ROXICODONE) 15 MG immediate release tablet  [name], MD [name], MD  Outpatient Medication Detail   Disp Refills Start End   oxyCODONE (ROXICODONE) 15 MG immediate release tablet 40 tablet 0 11/08/2019       Erin at [facility] called and states this script needs to be rewritten as coming from congregational nursing or this will cost pt $90.00  [place], [place] - [name]. Phone:  [phone]  Fax:  [phone]

## 2019-11-08 NOTE — Progress Notes (Signed)
refills  

## 2019-11-08 NOTE — Telephone Encounter (Signed)
Erin at [facility] called and states this script needs to be rewritten as coming from congregational nursing or this will cost pt $90.00

## 2019-11-11 ENCOUNTER — Other Ambulatory Visit: Payer: Self-pay

## 2019-11-11 ENCOUNTER — Ambulatory Visit (INDEPENDENT_AMBULATORY_CARE_PROVIDER_SITE_OTHER): Payer: No Payment, Other | Admitting: Licensed Clinical Social Worker

## 2019-11-11 DIAGNOSIS — F3164 Bipolar disorder, current episode mixed, severe, with psychotic features: Secondary | ICD-10-CM | POA: Diagnosis not present

## 2019-11-12 NOTE — Progress Notes (Signed)
   THERAPIST PROGRESS NOTE  Session Time: 58 min  Participation Level: Active  Behavioral Response: CasualAlertNegative, Angry, Anxious and Dysphoric  Type of Therapy: Individual Therapy  Treatment Goals addressed: Anger and Coping  Interventions: Supportive and Other: Grief counseling, Communication styles  Summary: Juan Hester is a 39 y.o. male who presents with hx of Bipolar Disorder. This date pt signs on for video session. He is initially calm and mostly pleasant. He then begins to express c/o about how time was spent in last session. He reports he does not need to talk about current stressors like work as he knows what he needs to do about such matters. He then starts talking about work. States he wants "a career not a job" and wants to return to school. 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 LCSW praised goal of going to school and assessed for how pt would like to spend time this session. Pt brings up the loss of his fam. Pt struggles with the fact his spouse left (2016,went to [place]) with the children, who are now 38 and 11, and the dream he had about a nice family life. LCSW assisted to process thoughts and feelings r/t grief. Attmepted to provide some grief education/counseling. Pt states "no body understands grief". LCSW assessed for suicidal ideation. Pt admits to some suicidal thoughts, denies current plan. He states "I have a philosophical belief about suicide". He shares he does not feel it is wrong and begins to talk about the bible. LCSW comments "everyone has their own belief system". Juan Hester becomes very angry and is yelling after this comment. He states "Everyone shouldn't have there own belief system, it should all be the same!" He agrees, however, people's perceptions can be different. He continues to yell and says "You're fucking stupid". LCSW immediately interrupted pt and advised he is not to talk to this clinician that way ever again. Pt apologizes and says he was not  talking about this clinician he was speaking in general. He changes his demeanor, he stops yelling. LCSW went on to assess for other worries concerns he wished to address. Pt shares he is having some fears but is reluctant to provide details. LCSW encouraged him to be as open as possible. He reveals visual and auditory hallucintations. Hearing children laughing, which he describes as good and bad. He reports seeing a frightening vision of a "cape with a head" floating through his closed and locked door while he was lying in bed watching TV. He states it was so frightening he felt like he would be better off sleeping on the sidewalk outside. Pt states he has not had hallucinations this bad in 22 yrs. Assessed for meds. Pt feels meds need adjusting but his next appt is weeks out. Advised LCSW would see if he can be seen earlier via open access. LCSW reminded pt of 24/7 crisis unit at this facility. Reviwed poc. Pt agrees to futuristic planning. Pt begins to cry and again apologized for the "misunderstanding" earlier in session, states "I don't want you to be mad at me". LCSW assured him his apology was already accepted and that would remain in the past. Pt states appreciation.    Suicidal/Homicidal: Yeswithout intent/plan  Therapist Response: Pt somewhat open to care.  Plan: Return again in 2 weeks.  Diagnosis: Axis I: Bipolar, mixed    Axis II: Deferred   Hermine Messick, LCSW 11/12/2019

## 2019-11-18 ENCOUNTER — Telehealth (INDEPENDENT_AMBULATORY_CARE_PROVIDER_SITE_OTHER): Payer: No Payment, Other | Admitting: Psychiatry

## 2019-11-18 ENCOUNTER — Encounter (HOSPITAL_COMMUNITY): Payer: Self-pay | Admitting: Psychiatry

## 2019-11-18 ENCOUNTER — Other Ambulatory Visit: Payer: Self-pay

## 2019-11-18 ENCOUNTER — Other Ambulatory Visit: Payer: Self-pay | Admitting: Critical Care Medicine

## 2019-11-18 DIAGNOSIS — F3131 Bipolar disorder, current episode depressed, mild: Secondary | ICD-10-CM

## 2019-11-18 DIAGNOSIS — F411 Generalized anxiety disorder: Secondary | ICD-10-CM

## 2019-11-18 MED ORDER — QUETIAPINE FUMARATE 50 MG PO TABS: 50.0000 mg | ORAL_TABLET | Freq: Two times a day (BID) | ORAL | 2 refills | Status: DC

## 2019-11-18 MED ORDER — DIVALPROEX SODIUM 500 MG PO DR TAB: 500.0000 mg | DELAYED_RELEASE_TABLET | Freq: Two times a day (BID) | ORAL | 3 refills | Status: DC

## 2019-11-18 MED ORDER — QUETIAPINE FUMARATE 300 MG PO TABS: 300.0000 mg | ORAL_TABLET | Freq: Every day | ORAL | 2 refills | Status: DC

## 2019-11-18 MED ORDER — DULOXETINE HCL 60 MG PO CPEP: 60.0000 mg | ORAL_CAPSULE | Freq: Every day | ORAL | 3 refills | Status: DC

## 2019-11-18 MED FILL — QUETIAPINE FUMARATE 50 MG T: 50 | 30 days supply | Qty: 60 | Fill #0

## 2019-11-18 MED FILL — DULoxetine HCL 60 MG CPEP: 60 | 30 days supply | Qty: 30 | Fill #0

## 2019-11-18 MED FILL — DIVALPROEX SOD DR 500 MG TA: 500 | 30 days supply | Qty: 60 | Fill #0

## 2019-11-18 MED FILL — QUETIAPINE FUMARATE 300 MG: 300 | 30 days supply | Qty: 30 | Fill #0

## 2019-11-18 NOTE — Progress Notes (Signed)
[facility] MD/PA/NP OP Progress Note Virtual Visit via Video Note  I connected with Juan Hester on 11/18/19 at 11:00 AM EDT by a video enabled telemedicine application and verified that I am speaking with the correct person using two identifiers.  Location: Patient: Home Provider: Clinic   I discussed the limitations of evaluation and management by telemedicine and the availability of in person appointments. The patient expressed understanding and agreed to proceed.  I provided 30 minutes of non-face-to-face time during this encounter.    11/18/2019 12:56 PM Juan Hester  MRN:  [id number]  Chief Complaint:  "Im having panic attacks 4 to 5 times a day"  HPI: 39 year old male seen today for follow up psychiatric evaluation.  He has a psychiatric history of bipolar 1 disorder, bipolar affective disorder, PTSD, schizoaffective disorder, anxiety, depression, and suicidal ideation.  He is currently being managed on Celexa 60 mg daily, hydroxyzine 25 mg 3 times daily, Seroquel 200 mg at bedtime, and Depakote 500 mg twice daily.  Today the patient notes that he has been having increased anxiety and panic attacks.  Patient is irritable during the exam.  He notes that he was offended when he found out that he was prescribed hydroxyzine which is an antihistamine.  He informed Probation officer that Klonopin was effective in the past that he dislikes that providers will not prescribe to him.  Provider informed patient that a benzodiazepine is not generally the first-line of medication prescribed.  He endorsed understanding however notes that he is not a drug addict and will not abuse prescribed medications.   During assessment patient notes that at times he feels depressed however reports that he is more anxious.  He notes that his anxiety interferes with his daily life.  He notes that he recently lost his job at [employer].  He notes that he had a panic attack at work where he had increased respiration,  increased heart rate, crying spells, abdominal pain, chest.  He notes that during the daytime he has panic attacks 4-5 times a day.  Recently he restarted a job at a [employer] and notes that he would like medications to help manage his psychiatric conditions.     Writer asked patient if he found Seroquel effective in managing his symptoms of anxiety, mood, and sleep.  He notes that he no longer has panic attacks at night however notes that he only sleeps 3 to 4 hours a night.  Writer suggested taking BuSpar in conjunction with hydroxyzine.  He notes that he does not want to take either medication and requested Klonopin.  Provider informed patient that at this time Klonopin would not be prescribed.  Patient notes that he has marijuana medicinally in the past and has recently started using that substance again.  He notes however that it is been ineffective in managing his anxiety.  Patient informed provider that he experiences visual and auditory hallucinations.  He denies SI/HI/VAH or paranoia.  Patient is agreeable to increasing Seroquel 200 mg to 300 mg at bedtime to help improve sleep, mood, psychosis, and anxiety.  He is also agreeable to start Seroquel 50 mg twice daily to help with symptoms of anxiety and psychosis.  He will continue all other medications as prescribed.   Visit Diagnosis:    ICD-10-CM   1. Bipolar affective disorder, currently depressed, mild (HCC)  F31.31 divalproex (DEPAKOTE) 500 MG DR tablet    DULoxetine (CYMBALTA) 60 MG capsule    QUEtiapine (SEROQUEL) 300 MG tablet  QUEtiapine (SEROQUEL) 50 MG tablet  2. GAD (generalized anxiety disorder)  F41.1 DULoxetine (CYMBALTA) 60 MG capsule    QUEtiapine (SEROQUEL) 50 MG tablet    Past Psychiatric History: Bipolar disorder, alcohol use (patient notes that he never had an issue with substance use and this diagnosis is incorrect), anxiety, depression, and suicidal ideation.  Past Medical History:  Past Medical History:   Diagnosis Date  . Crohn disease ([place])   . PE (pulmonary thromboembolism) ([place])   . Thyroid disease     Past Surgical History:  Procedure Laterality Date  . ABDOMINAL SURGERY    . COLON SURGERY    . IR RADIOLOGIST EVAL & MGMT  08/31/2019  . SMALL INTESTINE SURGERY      Family Psychiatric History:  unknown  Family History: History reviewed. No pertinent family history.  Social History:  Social History   Socioeconomic History  . Marital status: Divorced    Spouse name: Not on file  . Number of children: Not on file  . Years of education: Not on file  . Highest education level: Not on file  Occupational History  . Not on file  Tobacco Use  . Smoking status: Current Every Day Smoker    Packs/day: 1.00  . Smokeless tobacco: Never Used  Vaping Use  . Vaping Use: Never used  Substance and Sexual Activity  . Alcohol use: Yes  . Drug use: Not Currently  . Sexual activity: Yes  Other Topics Concern  . Not on file  Social History Narrative  . Not on file   Social Determinants of Health   Financial Resource Strain:   . Difficulty of Paying Living Expenses:   Food Insecurity:   . Worried About Charity fundraiser in the Last Year:   . Arboriculturist in the Last Year:   Transportation Needs:   . Film/video editor (Medical):   Marland Kitchen Lack of Transportation (Non-Medical):   Physical Activity:   . Days of Exercise per Week:   . Minutes of Exercise per Session:   Stress:   . Feeling of Stress :   Social Connections:   . Frequency of Communication with Friends and Family:   . Frequency of Social Gatherings with Friends and Family:   . Attends Religious Services:   . Active Member of Clubs or Organizations:   . Attends Archivist Meetings:   Marland Kitchen Marital Status:     Allergies:  Allergies  Allergen Reactions  . Azathioprine Other (See Comments)    Pt does not recall reaction    . Ciprofloxacin Other (See Comments)    Pt does not recall reaction    .  Mesalamine Other (See Comments)    Pt does not recall reaction  . Metronidazole Other (See Comments)    Pt does not recall reaction   . Sulfa Antibiotics Other (See Comments)    Pt does not recall reaction   . Barium Sulfate Rash  . Barium-Containing Compounds Rash    Metabolic Disorder Labs: Lab Results  Component Value Date   HGBA1C 5.0 05/05/2019   No results found for: PROLACTIN Lab Results  Component Value Date   CHOL 155 05/05/2019   TRIG 202 (H) 05/05/2019   HDL 55 05/05/2019   CHOLHDL 2.8 05/05/2019   LDLCALC 67 05/05/2019   Lab Results  Component Value Date   TSH 0.659 08/18/2019   TSH 0.493 05/05/2019    Therapeutic Level Labs: No results found for: LITHIUM No results  found for: VALPROATE No components found for:  CBMZ  Current Medications: Current Outpatient Medications  Medication Sig Dispense Refill  . divalproex (DEPAKOTE) 500 MG DR tablet Take 1 tablet (500 mg total) by mouth 2 (two) times daily. 60 tablet 3  . DULoxetine (CYMBALTA) 60 MG capsule Take 1 capsule (60 mg total) by mouth daily. 30 capsule 3  . naloxone (NARCAN) nasal spray 4 mg/0.1 mL Use as needed for overdose symptoms from opiate 1 each 0  . naproxen (NAPROSYN) 500 MG tablet Take 1 tablet (500 mg total) by mouth 2 (two) times daily with a meal. 60 tablet 0  . oxyCODONE (ROXICODONE) 15 MG immediate release tablet Take 1 tablet (15 mg total) by mouth every 6 (six) hours as needed for pain. 40 tablet 0  . QUEtiapine (SEROQUEL) 300 MG tablet Take 1 tablet (300 mg total) by mouth at bedtime. 30 tablet 2  . QUEtiapine (SEROQUEL) 50 MG tablet Take 1 tablet (50 mg total) by mouth 2 (two) times daily. 60 tablet 2  . rivaroxaban (XARELTO) 20 MG TABS tablet Take 1 tablet (20 mg total) by mouth daily with supper. 30 tablet 4   No current facility-administered medications for this visit.     Musculoskeletal: Strength & Muscle Tone: Unable to assess due to telehealth visit [place]: Unable to  assess due to telehealth visit Patient leans: N/A  Psychiatric Specialty Exam: Review of Systems  There were no vitals taken for this visit.There is no height or weight on file to calculate BMI.  General Appearance: Well Groomed  Eye Contact:  Good  Speech:  Clear and Coherent and Normal Rate  Volume:  Normal  Mood:  Anxious and Irritable  Affect:  Congruent  Thought Process:  Coherent, Goal Directed and Linear  Orientation:  Full (Time, Place, and Person)  Thought Content: Logical and Hallucinations: Auditory Visual   Suicidal Thoughts:  No  Homicidal Thoughts:  No  Memory:  Immediate;   Good Recent;   Good Remote;   Good  Judgement:  Good  Insight:  Good  Psychomotor Activity:  Normal  Concentration:  Concentration: Good and Attention Span: Good  Recall:  Good  Fund of Knowledge: Good  Language: Good  Akathisia:  No  Handed:  Right  AIMS (if indicated): Not done  Assets:  Communication Skills Desire for Improvement Financial Resources/Insurance Housing Social Support  ADL's:  Intact  Cognition: WNL  Sleep:  Poor   Screenings: GAD-7     Office Visit from 09/01/2019 in [facility] Office Visit from 08/18/2019 in [facility] Office Visit from 07/21/2019 in [facility] Office Visit from 06/02/2019 in [facility]  Total GAD-7 Score 21 16 14 12     PHQ2-9     Office Visit from 09/01/2019 in [facility] Office Visit from 08/18/2019 in [facility] Office Visit from 07/21/2019 in [facility] Office Visit from 06/02/2019 in [facility] Office Visit from 05/05/2019 in [facility]  PHQ-2 Total Score 4 2 2 1  0  PHQ-9 Total Score 15 9 9 10  --       Assessment and Plan: Patient endorses increased anxiety and poor sleep.  He  also endorses visual auditory hallucinations however notes that they have improved since starting Seroquel.  He is agreeable to increasing  Seroquel 200 mg to 300 mg at bedtime to help improve sleep, psychosis, and anxiety.  He is also agreeable to starting Seroquel 50 mg twice daily to help with symptoms of anxiety and mood.  He will continue all other medications as prescribed.  1. Bipolar affective disorder, currently depressed, mild (HCC)  Continue- divalproex (DEPAKOTE) 500 MG DR tablet; Take 1 tablet (500 mg total) by mouth 2 (two) times daily.  Dispense: 60 tablet; Refill: 3 Continue- DULoxetine (CYMBALTA) 60 MG capsule; Take 1 capsule (60 mg total) by mouth daily.  Dispense: 30 capsule; Refill: 3 Increased- QUEtiapine (SEROQUEL) 300 MG tablet; Take 1 tablet (300 mg total) by mouth at bedtime.  Dispense: 30 tablet; Refill: 2 Start- QUEtiapine (SEROQUEL) 50 MG tablet; Take 1 tablet (50 mg total) by mouth 2 (two) times daily.  Dispense: 60 tablet; Refill: 2  2. GAD (generalized anxiety disorder)  Continuw- DULoxetine (CYMBALTA) 60 MG capsule; Take 1 capsule (60 mg total) by mouth daily.  Dispense: 30 capsule; Refill: 3 Start- QUEtiapine (SEROQUEL) 50 MG tablet; Take 1 tablet (50 mg total) by mouth 2 (two) times daily.  Dispense: 60 tablet; Refill: 2  Follow-up in 1 month   Salley Slaughter, NP 11/18/2019, 12:56 PM

## 2019-11-25 ENCOUNTER — Ambulatory Visit (INDEPENDENT_AMBULATORY_CARE_PROVIDER_SITE_OTHER): Payer: No Payment, Other | Admitting: Licensed Clinical Social Worker

## 2019-11-25 ENCOUNTER — Other Ambulatory Visit: Payer: Self-pay

## 2019-11-25 DIAGNOSIS — F3164 Bipolar disorder, current episode mixed, severe, with psychotic features: Secondary | ICD-10-CM | POA: Diagnosis not present

## 2019-11-26 NOTE — Progress Notes (Signed)
THERAPIST PROGRESS NOTE  Session Time: 45 min Virtual Visit via Video Note  I connected with Juan Hester on 11/26/19 at  9:00 AM EDT by a video enabled telemedicine application and verified that I am speaking with the correct person using two identifiers.  Location: Patient: Home Provider: [facility]   I discussed the limitations of evaluation and management by telemedicine and the availability of in person appointments. The patient expressed understanding and agreed to proceed. I discussed the assessment and treatment plan with the patient. The patient was provided an opportunity to ask questions and all were answered. The patient agreed with the plan and demonstrated an understanding of the instructions.  I provided 45 minutes of non-face-to-face time during this encounter. Hermine Messick, LCSW  Participation Level: Active  Behavioral Response: CasualAlertNegative, Depressed and Dysphoric  Type of Therapy: Individual Therapy  Treatment Goals addressed: Coping  Interventions: Solution Focused and Supportive  Summary: Juan Hester is a 39 y.o. male who presents with hx of Bipolar Disorder. This date LCSW sends link for pt to sign on to session via text. When he does not sign on LCSW phones pt. He advises he could not connect as usual through My Chart as the button to start session was not present. He reports he cannot use cell phone, must use lap top.  Pt states he tried to call but the person he spoke with could not help him. He does not know who he spoke to. LCSW advises of plan to speak with admin staff and be back in touch. He is frustrated but agrees. Pt was apparently scheduled for in person session for some reason, which was changed to video. Phoned pt to let him know he should be able to sign on via My Chart now. He does so. Pt presents as depressed and dysphoric. LCSW assessed for med management session that got moved up. He states he had appt and "It was a waist of time".  He goes on about not being able to get the medication he feels he needs to control his panic attacks. Continues to be very angry about this. He states he is "fed up with people". LCSW acknowledged his frustration. Assessed for how pt would like to spend remainder of session time and he states "I don't know". LCSW assessed for any movement on pt's goal of returning to school. Pt advises he cannot go to school as he lacks income, does not have "stable housing". Last rent payment by [employer] is Sep. Pt remains without work. Disability case in appeal with legal assistance. LCSW refers pt to [employer] for possible housing help. He already has food stamps. Informed pt of local shelters in worse case scenario. LCSW assessed for SI, paranoia and hallucinations. Pt admits to SI yet states he has no means and no plan. This date he brings up a desire to live since he had his CA dx in 2019 saying "When you think you could die it makes you think more about living". He also mentions his kids as a reason for living. Pt continues to have audio and visual hallucinations he reports he expects as part of his illness. LCSW assessed for any contact with fam. He reports some texting with kids, negative from dtr. He feels they do not want to be in touch with him. Again addressed what a difficult loss this has been and still is. Brief review of coping. Session time limited d/t tech issues. LCSW reviewed poc prior to close of  session. Reminded pt of this facility being open 24/7 for crisis as needed.       Suicidal/Homicidal: Yeswithout intent/plan  Therapist Response: Pt remains somewhat open to care. Assess musical talents he mentions.  Plan: Return again for next avail.  Diagnosis: Axis I: Bipolar, mixed    Axis II: Deferred  Hermine Messick, LCSW 11/26/2019

## 2019-11-30 ENCOUNTER — Other Ambulatory Visit: Payer: Self-pay | Admitting: Critical Care Medicine

## 2019-11-30 MED ORDER — OXYCODONE HCL 15 MG PO TABS: 15.0000 mg | ORAL_TABLET | Freq: Four times a day (QID) | ORAL | 0 refills | Status: DC | PRN

## 2019-11-30 MED FILL — QUETIAPINE FUMARATE 50 MG T: 50 | 30 days supply | Qty: 60 | Fill #0

## 2019-11-30 MED FILL — oxyCODONE HCL 15 MG TABS: 15 | 10 days supply | Qty: 40 | Fill #0

## 2019-11-30 MED FILL — QUETIAPINE FUMARATE 300 MG: 300 | 30 days supply | Qty: 30 | Fill #0

## 2019-12-02 ENCOUNTER — Other Ambulatory Visit: Payer: Self-pay

## 2019-12-02 ENCOUNTER — Encounter: Payer: Self-pay | Admitting: Critical Care Medicine

## 2019-12-02 ENCOUNTER — Ambulatory Visit: Payer: Self-pay | Attending: Critical Care Medicine | Admitting: Critical Care Medicine

## 2019-12-02 DIAGNOSIS — M5416 Radiculopathy, lumbar region: Secondary | ICD-10-CM

## 2019-12-02 DIAGNOSIS — I2694 Multiple subsegmental pulmonary emboli without acute cor pulmonale: Secondary | ICD-10-CM

## 2019-12-02 DIAGNOSIS — F3131 Bipolar disorder, current episode depressed, mild: Secondary | ICD-10-CM

## 2019-12-02 NOTE — Progress Notes (Signed)
Subjective:    Patient ID: Juan Hester, male    DOB: August 05, 1980, 39 y.o.   MRN: [id number] Virtual Visit via Video Note  I connected with@ on 12/03/19 at@ by a video enabled telemedicine application and verified that I am speaking with the correct person using two identifiers.   Consent:  I discussed the limitations, risks, security and privacy concerns of performing an evaluation and management service by video visit and the availability of in person appointments. I also discussed with the patient that there may be a patient responsible charge related to this service. The patient expressed understanding and agreed to proceed.  Location of patient: pt at home  Location of provider: I am in my office   Persons participating in the televisit with the patient.   No one else on the call    History of Present Illness:   This is a complex 40 year old male who just arrived here from the [place] area and currently is living with relatives who presents for evaluation of pulmonary embolism history, Crohn's disease, history of colon carcinoma status post sigmoid resection with colostomy formation, and need for paperwork regarding patient's work status.  The patient currently is living in a hotel.  Patient arrived here in December.  The patient in [place] was under care by gastroenterologist was on Remicade for Crohn's disease and also being followed for his oncology status.  He is due for a colonoscopy at this time.  He works at [employer] he has history of bipolar disorder and had previously been on Cogentin Zyprexa, Depakote and Cymbalta.  Patient is also had major depression as well in the past.  The patient states he has significant volume output from his colostomy with a diarrheal type syndrome and he does have Imodium for this.  The patient has abdominal pain at times.  He has chronic fatigue.  He cannot stand and one place for prolonged periods of time.  He fell at work and his lower back  has been difficult for him since that fall.  He was in the emergency room and underwent CT scan of the head and this was negative.  The patient is needing refills on his medications and he does not have insurance at this time.  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  The patient states his back pain is worsening.  He did fall at work and March.  He states the pain was tolerable but now markedly worse if he stands for long periods of time.  He was not able to use a Lidoderm patch as he cannot reach the center part of his back.  He notes pain shooting down both legs.  He has some difficulty walking as well at this time.  There has not been any lumbar imaging studies done although when he went to the emergency room previously there was a CT scan of the abdomen was done and did not show changes in the lumbar spine but this was a limited study  Patient states his cyclobenzaprine is not useful but he is not taking it on a regular basis The patient has no change in his breathing status and is maintaining his  anticoagulant for history of pulmonary emboli.  The patient is yet to achieve a gastroenterology appointment as he needs the orange card first.  He has his financial paperwork together so make an appointment for the financial counselor  08/18/2019 The patient comes in with lower extremity edema and is just gone to the ER 1 day ago for progressive back pain which occurred on the job.  He has since been let go from the job he was working.  He has another job as well he works.  The patient is yet to receive his lumbar x-rays as ordered.  He does not have financial assistance as of yet to be able to achieve an  MRI or orthopedics appointment.  Patient notes increased edema in the lower extremities and some shortness of breath with exertion. Note I reviewed his CT scan of the chest March which did not show pulmonary emboli but did show atelectasis at the bases of the lungs.   Wt Readings from Last 3 Encounters: 08/18/19 : 274 lb (124.3 kg) 07/21/19 : 264 lb 3.2 oz (119.8 kg) 07/11/19 : 260 lb (117.9 kg)  Patient cannot sleep flat is still smoking half pack a day of cigarettes denies wheezing slight productive cough of green mucus no sinus complaints  09/01/2019 This patient is seen in return follow-up and has been to the emergency room twice since I last saw him several weeks ago.  Patient has continued pain in the lower back.  He has a normal lumbar study.  He had fallen at work.  He is now been fired from his job.  He has retained an attorney for [employer].  The patient states his pain is more in the upper thoracic area as well as in the lumbar area at this time.  Note he also went to interventional radiology they recommend not starting in this patient with an in fear vena cava filter but instead study his lower extremities first.  He is yet to have a vascular study the lower extremities for DVT.  He is now off Xarelto because he was bleeding from an opening of the rectal closure from his previous total colectomy.  The patient still has some difficulty in that area but the bleeding is less off the Xarelto.  Patient is also seeing gastroenterology and they are awaiting further evaluation of the rectal tear.  The patient is yet to see a surgeon for this.  He is here today for follow-up.  10/19/2019 This patient is seen in return follow-up and notes continued low back pain with radiculopathy down the left lower extremity.  He saw orthopedics and they recommended an injection however they said that they could not perform it under his Workmen's Comp. agreement.  He is unable to afford any  type of procedure as he is self-pay at this time.  He is also wishing a second opinion on his back.  In addition to this he has had increased depression and mood swings and this ultimately resulted in increased alcohol use when he was with a friend over the weekend and when the friend left him he became extremely despondent and started thinking of suicidal ideation.  He called for help and was brought to the emergency room where he had a 36-hour stay in involuntary commitment until he can be cleared by psychiatry which did occurred he was just discharged 24 hours ago.  He was given a paper prescription for Seroquel and also for his Depakote however he cannot afford these  medications at this time.  He was also not given a refill on Cymbalta and has run out of the Cymbalta.  He also has run out of his oxycodone as well for his back pain.  He does have a supply of Xarelto and maintains this 20 mg daily without any additional bleeding noted in the rectal area.  When he came to the ER he was very much confused had been drinking excessively and somewhat altered he did clear this out and he has not had further drinking since that time.  He states when he uses the oxycodone he only takes it about 3-4 times daily.  He states the cyclobenzaprine was of no use to him.  He has had several falls because of left-sided weakness in the lower extremity.  He feels like there is electric shock going down his leg and this resulted in the falls.  He has bruised his left shoulder with an injury and a fall in his bathroom. He was unable to obtain transportation therefore this visit was done via video Note the psychiatry doctor did discontinue his Zyprexa in favor of use of Seroquel 200 mg daily.  He does have follow-up with a behavioral therapist and psychiatrist in the next 24 hours [facility].  11/01/2019 This is a video visit and follow-up from her previous video visit several weeks ago.   The patient attempted to get to see neurosurgery but they were going to charge her co-pay he was not able to achieve that visit.  The patient is no longer drinking alcohol regularly.  He still has significant anxiety.  He did see the mental health providers at the clinic and they prescribed hydroxyzine for anxiety and this has not helped his symptoms.  He is wishing to get back on the some type of benzodiazepine.  He is seeing a Social worker at this time.  He denies any bleeding from the rectal area.  He still has low back pain.  He is using the oxycodone as needed for pain and only minimally using this.  He has called his lawyer deceiving get Workmen's Comp. to cover the neurosurgical evaluation.  12/02/2019 I attempted to have a video visit with this patient he was in his hotel and appeared to be under some influence.  He could not focus or listen properly to the interaction.  He was stating he was having new onset shoulder pain and a cough.  He states his back is still tender.  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  He is trying to save up his money so that he can undergo spinal evaluations.  He is on a fairly high dose of oxycodone and this will need to be reduced.  I discussed with him the need and the importance of changing his pain medication.  He took this under advisement.  It was difficult to proceed with any other elements of this visit so I told the patient we would follow-up with him in the office setting  Past Medical History:  Diagnosis Date  . Crohn disease ([place])   . PE (pulmonary thromboembolism) ([place])   . Thyroid disease      History reviewed. No pertinent family history.   Social History   Socioeconomic History  . Marital status: Divorced    Spouse name: Not on file  . Number of children: Not on file  . Years of education: Not on file  . Highest education level: Not  on file  Occupational History  . Not on file  Tobacco Use  . Smoking status: Current Every Day  Smoker    Packs/day: 1.00  . Smokeless tobacco: Never Used  Vaping Use  . Vaping Use: Never used  Substance and Sexual Activity  . Alcohol use: Yes  . Drug use: Not Currently  . Sexual activity: Yes  Other Topics Concern  . Not on file  Social History Narrative  . Not on file   Social Determinants of Health   Financial Resource Strain:   . Difficulty of Paying Living Expenses: Not on file  Food Insecurity:   . Worried About Charity fundraiser in the Last Year: Not on file  . Ran Out of Food in the Last Year: Not on file  Transportation Needs:   . Lack of Transportation (Medical): Not on file  . Lack of Transportation (Non-Medical): Not on file  Physical Activity:   . Days of Exercise per Week: Not on file  . Minutes of Exercise per Session: Not on file  Stress:   . Feeling of Stress : Not on file  Social Connections:   . Frequency of Communication with Friends and Family: Not on file  . Frequency of Social Gatherings with Friends and Family: Not on file  . Attends Religious Services: Not on file  . Active Member of Clubs or Organizations: Not on file  . Attends Archivist Meetings: Not on file  . Marital Status: Not on file  Intimate Partner Violence:   . Fear of Current or Ex-Partner: Not on file  . Emotionally Abused: Not on file  . Physically Abused: Not on file  . Sexually Abused: Not on file     Allergies  Allergen Reactions  . Azathioprine Other (See Comments)    Pt does not recall reaction    . Ciprofloxacin Other (See Comments)    Pt does not recall reaction    . Mesalamine Other (See Comments)    Pt does not recall reaction  . Metronidazole Other (See Comments)    Pt does not recall reaction   . Sulfa Antibiotics Other (See Comments)    Pt does not recall reaction   . Barium Sulfate Rash  . Barium-Containing Compounds Rash     Outpatient Medications Prior to Visit  Medication Sig Dispense Refill  . divalproex (DEPAKOTE) 500 MG DR  tablet Take 1 tablet (500 mg total) by mouth 2 (two) times daily. 60 tablet 3  . DULoxetine (CYMBALTA) 60 MG capsule Take 1 capsule (60 mg total) by mouth daily. 30 capsule 3  . naloxone (NARCAN) nasal spray 4 mg/0.1 mL Use as needed for overdose symptoms from opiate 1 each 0  . naproxen (NAPROSYN) 500 MG tablet Take 1 tablet (500 mg total) by mouth 2 (two) times daily with a meal. 60 tablet 0  . oxyCODONE (ROXICODONE) 15 MG immediate release tablet Take 1 tablet (15 mg total) by mouth every 6 (six) hours as needed for pain. 40 tablet 0  . QUEtiapine (SEROQUEL) 300 MG tablet Take 1 tablet (300 mg total) by mouth at bedtime. 30 tablet 2  . QUEtiapine (SEROQUEL) 50 MG tablet Take 1 tablet (50 mg total) by mouth 2 (two) times daily. 60 tablet 2  . rivaroxaban (XARELTO) 20 MG TABS tablet Take 1 tablet (20 mg total) by mouth daily with supper. 30 tablet 4   No facility-administered medications prior to visit.      Review of Systems  Constitutional: Positive for activity change.  HENT: Negative.   Eyes: Negative.   Respiratory: Negative for cough, shortness of breath and wheezing.   Cardiovascular: Negative for palpitations and leg swelling.  Gastrointestinal: Negative for abdominal distention, anal bleeding, blood in stool, constipation, diarrhea, nausea, rectal pain and vomiting.  Endocrine: Negative.   Genitourinary: Negative.   Musculoskeletal: Positive for gait problem.  Psychiatric/Behavioral: Positive for agitation, behavioral problems, decreased concentration, dysphoric mood, sleep disturbance and suicidal ideas. Negative for self-injury. The patient is nervous/anxious and is hyperactive.        Objective:   Physical Exam  There were no vitals filed for this visit.  No exam is this is a video visit however the patient is observed on the video visit to be calm and in no acute distress and is at his apartment however he was not able to complete the interview as he had difficulty  hearing and also did not appear to be focused at this time     Assessment & Plan:  I personally reviewed all images and lab data in the [facility] system as well as any outside material available during this office visit and agree with the  radiology impressions.   Multiple subsegmental pulmonary emboli without acute cor pulmonale ([place]) Plan to continue Xarelto for now  Lumbar radiculopathy Severe lumbar radiculopathy hopefully will be able to get to spinal surgeon soon  Bipolar affective disorder, currently depressed, mild ([place]) Bipolar disorder under care of psychiatry now on high-dose Seroquel   Diagnoses and all orders for this visit:  Multiple subsegmental pulmonary emboli without acute cor pulmonale ([facility])  Lumbar radiculopathy  Bipolar affective disorder, currently depressed, mild ([place])     Follow Up Instructions: The patient knows a follow-up visit will be made in 1 month   I discussed the assessment and treatment plan with the patient. The patient was provided an opportunity to ask questions and all were answered. The patient agreed with the plan and demonstrated an understanding of the instructions.   The patient was advised to call back or seek an in-person evaluation if the symptoms worsen or if the condition fails to improve as anticipated.  I provided 30 minutes of non-face-to-face time during this encounter  including  median intraservice time , review of notes, labs, imaging, medications  and explaining diagnosis and management to the patient .    Asencion Noble, MD

## 2019-12-03 NOTE — Assessment & Plan Note (Signed)
Bipolar disorder under care of psychiatry now on high-dose Seroquel

## 2019-12-03 NOTE — Assessment & Plan Note (Signed)
Plan to continue Xarelto for now

## 2019-12-03 NOTE — Assessment & Plan Note (Signed)
Severe lumbar radiculopathy hopefully will be able to get to spinal surgeon soon

## 2019-12-13 MED FILL — Divalproex Sodium Tab Delayed Release 500 MG: ORAL | Qty: 500 | Status: AC

## 2019-12-14 ENCOUNTER — Other Ambulatory Visit: Payer: Self-pay

## 2019-12-14 ENCOUNTER — Other Ambulatory Visit: Payer: Self-pay | Admitting: Critical Care Medicine

## 2019-12-14 NOTE — Telephone Encounter (Signed)
Copied from [place] [id number]. Topic: Quick Communication - Rx Refill/Question >> Dec 14, 2019 10:28 AM Yvette Rack wrote: Medication: oxyCODONE (ROXICODONE) 15 MG immediate release tablet  Has the patient contacted their pharmacy? no  Preferred Pharmacy (with phone number or street name): [facility] [phone] - [place], [address] [place] Phone: [phone]  Fax: [phone]  Agent: Please be advised that RX refills may take up to 3 business days. We ask that you follow-up with your pharmacy.

## 2019-12-14 NOTE — Telephone Encounter (Signed)
Requested medication (s) are due for refill today: no  Requested medication (s) are on the active medication list: yes   Last refill:  11/30/2019  Future visit scheduled: yes   Notes to clinic:  this refill cannot be delegated    Requested Prescriptions  Pending Prescriptions Disp Refills   oxyCODONE (ROXICODONE) 15 MG immediate release tablet 40 tablet 0    Sig: Take 1 tablet (15 mg total) by mouth every 6 (six) hours as needed for pain.      Not Delegated - Analgesics:  Opioid Agonists Failed - 12/14/2019 10:38 AM      Failed - This refill cannot be delegated      Passed - Urine Drug Screen completed in last 360 days.      Passed - Valid encounter within last 6 months    Recent Outpatient Visits           1 week ago Lumbar radiculopathy   [place] [name], MD   1 month ago GAD (generalized anxiety disorder)   [place] [name], MD   1 month ago Lumbar radiculopathy   [place], Patrick E, MD   3 months ago Acute bilateral low back pain without sciatica   [facility] [name], MD   3 months ago Localized edema   [place] [name], MD

## 2019-12-15 ENCOUNTER — Encounter: Payer: Self-pay | Admitting: Critical Care Medicine

## 2019-12-15 ENCOUNTER — Ambulatory Visit: Payer: Self-pay | Attending: Critical Care Medicine | Admitting: Critical Care Medicine

## 2019-12-15 ENCOUNTER — Other Ambulatory Visit: Payer: Self-pay

## 2019-12-15 VITALS — BP 110/70 | HR 117 | Temp 98.1°F | Resp 18 | Ht 73.0 in | Wt 251.0 lb

## 2019-12-15 DIAGNOSIS — F411 Generalized anxiety disorder: Secondary | ICD-10-CM

## 2019-12-15 DIAGNOSIS — I2694 Multiple subsegmental pulmonary emboli without acute cor pulmonale: Secondary | ICD-10-CM

## 2019-12-15 DIAGNOSIS — L03119 Cellulitis of unspecified part of limb: Secondary | ICD-10-CM

## 2019-12-15 DIAGNOSIS — IMO0002 Reserved for concepts with insufficient information to code with codable children: Secondary | ICD-10-CM

## 2019-12-15 DIAGNOSIS — M5416 Radiculopathy, lumbar region: Secondary | ICD-10-CM

## 2019-12-15 DIAGNOSIS — L02619 Cutaneous abscess of unspecified foot: Secondary | ICD-10-CM

## 2019-12-15 DIAGNOSIS — F3131 Bipolar disorder, current episode depressed, mild: Secondary | ICD-10-CM

## 2019-12-15 DIAGNOSIS — M5126 Other intervertebral disc displacement, lumbar region: Secondary | ICD-10-CM

## 2019-12-15 DIAGNOSIS — M21371 Foot drop, right foot: Secondary | ICD-10-CM

## 2019-12-15 MED ORDER — DULOXETINE HCL 60 MG PO CPEP: 60.0000 mg | ORAL_CAPSULE | Freq: Every day | ORAL | 3 refills | Status: DC

## 2019-12-15 MED ORDER — DOXYCYCLINE HYCLATE 100 MG PO TABS: 100.0000 mg | ORAL_TABLET | Freq: Two times a day (BID) | ORAL | 0 refills | Status: DC

## 2019-12-15 MED ORDER — OXYCODONE HCL 15 MG PO TABS: 15.0000 mg | ORAL_TABLET | Freq: Four times a day (QID) | ORAL | 0 refills | Status: DC | PRN

## 2019-12-15 MED ORDER — RIVAROXABAN 20 MG PO TABS: 20.0000 mg | ORAL_TABLET | Freq: Every day | ORAL | 4 refills | Status: DC

## 2019-12-15 MED FILL — oxyCODONE HCL 15 MG TABS: 15 | 10 days supply | Qty: 40 | Fill #0

## 2019-12-15 MED FILL — DULoxetine HCL 60 MG CPEP: 60 | 30 days supply | Qty: 30 | Fill #0

## 2019-12-15 MED FILL — DOXYCYCLINE HYCLATE 100 MG: 100 | 10 days supply | Qty: 20 | Fill #0

## 2019-12-15 MED FILL — XARELTO 20 MG TABLET: 20 | 30 days supply | Qty: 30 | Fill #0

## 2019-12-15 NOTE — Assessment & Plan Note (Signed)
Sudden onset of right foot drop rest of neurologic exam unremarkable  Referral to orthopedics to evaluate spine

## 2019-12-15 NOTE — Assessment & Plan Note (Signed)
Cellulitis anterior aspect of the right foot from an abrasion the secondarily infected Will give doxycycline 100 mg 1 twice daily for 7 days

## 2019-12-15 NOTE — Assessment & Plan Note (Signed)
I have referred this patient back to orthopedics hopefully will achieve this visit  Due to chronic pain I did refill the oxycodone

## 2019-12-15 NOTE — Progress Notes (Signed)
Subjective:    Patient ID: Juan Hester, male    DOB: 10-12-1980, 39 y.o.   MRN: [id number]    History of Present Illness:   First OV 07/21/19 This is a complex 39 year old male who just arrived here from the [place] area and currently is living with relatives who presents for evaluation of pulmonary embolism history, Crohn's disease, history of colon carcinoma status post sigmoid resection with colostomy formation, and need for paperwork regarding patient's work status.  The patient currently is living in a hotel.  Patient arrived here in December.  The patient in [place] was under care by gastroenterologist was on Remicade for Crohn's disease and also being followed for his oncology status.  He is due for a colonoscopy at this time.  He works at [employer] he has history of bipolar disorder and had previously been on Cogentin Zyprexa, Depakote and Cymbalta.  Patient is also had major depression as well in the past.  The patient states he has significant volume output from his colostomy with a diarrheal type syndrome and he does have Imodium for this.  The patient has abdominal pain at times.  He has chronic fatigue.  He cannot stand and one place for prolonged periods of time.  He fell at work and his lower back has been difficult for him since that fall.  He was in the emergency room and underwent CT scan of the head and this was negative.  The patient is needing refills on his medications and he does not have insurance at this time.  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  The patient states his back pain is worsening.  He did fall at work and March.  He states the pain was tolerable but now  markedly worse if he stands for long periods of time.  He was not able to use a Lidoderm patch as he cannot reach the center part of his back.  He notes pain shooting down both legs.  He has some difficulty walking as well at this time.  There has not been any lumbar imaging studies done although when he went to the emergency room previously there was a CT scan of the abdomen was done and did not show changes in the lumbar spine but this was a limited study  Patient states his cyclobenzaprine is not useful but he is not taking it on a regular basis The patient has no change in his breathing status and is maintaining his anticoagulant for history of pulmonary emboli.  The patient is yet to achieve a gastroenterology appointment as he needs the orange card first.  He has his financial paperwork together so make an appointment for the financial counselor  08/18/2019 The patient comes in with lower extremity edema and is just gone to the ER 1 day ago for progressive back pain which occurred on the job.  He has since been let go from the job he was working.  He has another job as well he works.  The patient is yet to receive his lumbar x-rays as ordered.  He does not have financial assistance as of yet to be able to achieve an MRI  or orthopedics appointment.  Patient notes increased edema in the lower extremities and some shortness of breath with exertion. Note I reviewed his CT scan of the chest March which did not show pulmonary emboli but did show atelectasis at the bases of the lungs.   Wt Readings from Last 3 Encounters: 08/18/19 : 274 lb (124.3 kg) 07/21/19 : 264 lb 3.2 oz (119.8 kg) 07/11/19 : 260 lb (117.9 kg)  Patient cannot sleep flat is still smoking half pack a day of cigarettes denies wheezing slight productive cough of green mucus no sinus complaints  09/01/2019 This patient is seen in return follow-up and has been to the emergency room twice since I last saw him several weeks ago.   Patient has continued pain in the lower back.  He has a normal lumbar study.  He had fallen at work.  He is now been fired from his job.  He has retained an attorney for [employer].  The patient states his pain is more in the upper thoracic area as well as in the lumbar area at this time.  Note he also went to interventional radiology they recommend not starting in this patient with an in fear vena cava filter but instead study his lower extremities first.  He is yet to have a vascular study the lower extremities for DVT.  He is now off Xarelto because he was bleeding from an opening of the rectal closure from his previous total colectomy.  The patient still has some difficulty in that area but the bleeding is less off the Xarelto.  Patient is also seeing gastroenterology and they are awaiting further evaluation of the rectal tear.  The patient is yet to see a surgeon for this.  He is here today for follow-up.  10/19/2019 This patient is seen in return follow-up and notes continued low back pain with radiculopathy down the left lower extremity.  He saw orthopedics and they recommended an injection however they said that they could not perform it under his Workmen's Comp. agreement.  He is unable to afford any type of procedure as he is self-pay at this time.  He is also wishing a second opinion on his back.  In addition to this he has had increased depression and mood swings and this ultimately resulted in increased alcohol use when he was with a friend over the weekend and when the friend left him he became extremely despondent and started thinking of suicidal ideation.  He called for help and was brought to the emergency room where he had a 36-hour stay in involuntary commitment until he can be cleared by psychiatry which did occurred he was just discharged 24 hours ago.  He was given a paper prescription for Seroquel and also for his Depakote however he cannot afford these medications at  this time.  He was also not given a refill on Cymbalta and has run out of the Cymbalta.  He also has run out of his oxycodone as well for his back pain.  He does have a supply of Xarelto and maintains this 20 mg daily without any additional bleeding noted in the rectal area.  When he came to the ER he was very much confused had been drinking excessively and somewhat altered he did clear this out and he has not had further drinking since that time.  He states when he uses the oxycodone he only takes it about 3-4 times daily.  He states the cyclobenzaprine was of no use  to him.  He has had several falls because of left-sided weakness in the lower extremity.  He feels like there is electric shock going down his leg and this resulted in the falls.  He has bruised his left shoulder with an injury and a fall in his bathroom. He was unable to obtain transportation therefore this visit was done via video Note the psychiatry doctor did discontinue his Zyprexa in favor of use of Seroquel 200 mg daily.  He does have follow-up with a behavioral therapist and psychiatrist in the next 24 hours [facility].  11/01/2019 This is a video visit and follow-up from her previous video visit several weeks ago.  The patient attempted to get to see neurosurgery but they were going to charge her co-pay he was not able to achieve that visit.  The patient is no longer drinking alcohol regularly.  He still has significant anxiety.  He did see the mental health providers at the clinic and they prescribed hydroxyzine for anxiety and this has not helped his symptoms.  He is wishing to get back on the some type of benzodiazepine.  He is seeing a Social worker at this time.  He denies any bleeding from the rectal area.  He still has low back pain.  He is using the oxycodone as needed for pain and only minimally using this.  He has called his lawyer deceiving get Workmen's Comp. to cover the neurosurgical  evaluation.  12/02/2019 I attempted to have a video visit with this patient he was in his hotel and appeared to be under some influence.  He could not focus or listen properly to the interaction.  He was stating he was having new onset shoulder pain and a cough.  He states his back is still tender.  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  He is trying to save up his money so that he can undergo spinal evaluations.  He is on a fairly high dose of oxycodone and this will need to be reduced.  I discussed with him the need and the importance of changing his pain medication.  He took this under advisement.  It was difficult to proceed with any other elements of this visit so I told the patient we would follow-up with him in the office setting  12/15/2019 This patient returns in follow-up and has severe pain and now is developed foot drop on the right lower extremity.  He has yet to make his orthopedic appointment.  He has lost 35 pounds in weight.  He has a sore on the dorsal aspect of his right foot with surrounding erythema that he has noticed.  His pain is worse in the lower back area.  Note he does have significant lumbar disease on the MRI we previously have obtained.  He is out of his pain medications at this time.  I explained to him in order to maintain him on pain medicine we need to have him come in for regular face-to-face visit so that we can obtain a urine drug screen.  He says he is not using other substances.  He does state that his mental health became a little unbalanced when he stopped taking some of his medicines but now is back on his mental health medications He states on the Xarelto he occasionally has some blood from his colostomy area and rectal areas  The patient says the foot drop occurred several weeks ago and he  had to crawl about his apartment and developed sores on his feet and knees he says he is able to walk better now it suddenly changed   Past Medical History:   Diagnosis Date  . Crohn disease ([place])   . PE (pulmonary thromboembolism) ([place])   . Thyroid disease      History reviewed. No pertinent family history.   Social History   Socioeconomic History  . Marital status: Divorced    Spouse name: Not on file  . Number of children: Not on file  . Years of education: Not on file  . Highest education level: Not on file  Occupational History  . Not on file  Tobacco Use  . Smoking status: Current Every Day Smoker    Packs/day: 1.00  . Smokeless tobacco: Never Used  Vaping Use  . Vaping Use: Never used  Substance and Sexual Activity  . Alcohol use: Yes  . Drug use: Not Currently  . Sexual activity: Yes  Other Topics Concern  . Not on file  Social History Narrative  . Not on file   Social Determinants of Health   Financial Resource Strain:   . Difficulty of Paying Living Expenses: Not on file  Food Insecurity:   . Worried About Charity fundraiser in the Last Year: Not on file  . Ran Out of Food in the Last Year: Not on file  Transportation Needs:   . Lack of Transportation (Medical): Not on file  . Lack of Transportation (Non-Medical): Not on file  Physical Activity:   . Days of Exercise per Week: Not on file  . Minutes of Exercise per Session: Not on file  Stress:   . Feeling of Stress : Not on file  Social Connections:   . Frequency of Communication with Friends and Family: Not on file  . Frequency of Social Gatherings with Friends and Family: Not on file  . Attends Religious Services: Not on file  . Active Member of Clubs or Organizations: Not on file  . Attends Archivist Meetings: Not on file  . Marital Status: Not on file  Intimate Partner Violence:   . Fear of Current or Ex-Partner: Not on file  . Emotionally Abused: Not on file  . Physically Abused: Not on file  . Sexually Abused: Not on file     Allergies  Allergen Reactions  . Azathioprine Other (See Comments)    Pt does not recall reaction    .  Ciprofloxacin Other (See Comments)    Pt does not recall reaction    . Mesalamine Other (See Comments)    Pt does not recall reaction  . Metronidazole Other (See Comments)    Pt does not recall reaction   . Sulfa Antibiotics Other (See Comments)    Pt does not recall reaction   . Barium Sulfate Rash  . Barium-Containing Compounds Rash     Outpatient Medications Prior to Visit  Medication Sig Dispense Refill  . divalproex (DEPAKOTE) 500 MG DR tablet Take 1 tablet (500 mg total) by mouth 2 (two) times daily. 60 tablet 3  . naloxone (NARCAN) nasal spray 4 mg/0.1 mL Use as needed for overdose symptoms from opiate 1 each 0  . QUEtiapine (SEROQUEL) 300 MG tablet Take 1 tablet (300 mg total) by mouth at bedtime. 30 tablet 2  . QUEtiapine (SEROQUEL) 50 MG tablet Take 1 tablet (50 mg total) by mouth 2 (two) times daily. 60 tablet 2  . DULoxetine (CYMBALTA) 60 MG  capsule Take 1 capsule (60 mg total) by mouth daily. 30 capsule 3  . naproxen (NAPROSYN) 500 MG tablet Take 1 tablet (500 mg total) by mouth 2 (two) times daily with a meal. 60 tablet 0  . oxyCODONE (ROXICODONE) 15 MG immediate release tablet Take 1 tablet (15 mg total) by mouth every 6 (six) hours as needed for pain. 40 tablet 0  . rivaroxaban (XARELTO) 20 MG TABS tablet Take 1 tablet (20 mg total) by mouth daily with supper. 30 tablet 4   No facility-administered medications prior to visit.      Review of Systems  Constitutional: Positive for activity change.  HENT: Negative.   Eyes: Negative.   Respiratory: Negative for cough, shortness of breath and wheezing.   Cardiovascular: Negative for palpitations and leg swelling.  Gastrointestinal: Negative for abdominal distention, anal bleeding, blood in stool, constipation, diarrhea, nausea, rectal pain and vomiting.  Endocrine: Negative.   Genitourinary: Negative.   Musculoskeletal: Positive for gait problem.  Psychiatric/Behavioral: Positive for agitation, behavioral problems,  decreased concentration, dysphoric mood, sleep disturbance and suicidal ideas. Negative for self-injury. The patient is nervous/anxious and is hyperactive.        Objective:   Physical Exam  Vitals:   12/15/19 1005  BP: 110/70  Pulse: (!) 117  Resp: 18  Temp: 98.1 F (36.7 C)  SpO2: 98%  Weight: 251 lb (113.9 kg)  Height: 6' 1"  (1.854 m)    Gen: Pleasant, well-nourished, in no distress,  normal affect  ENT: No lesions,  mouth clear,  oropharynx clear, no postnasal drip  Neck: No JVD, no TMG, no carotid bruits  Lungs: No use of accessory muscles, no dullness to percussion, clear without rales or rhonchi  Cardiovascular: RRR, heart sounds normal, no murmur or gallops, no peripheral edema  Abdomen: soft and NT, no HSM,  BS normal  Musculoskeletal: No deformities, no cyanosis or clubbing  Neuro: alert, non focal  Skin: Warm, there is a quarter size scabbed over circular lesion on the dorsal aspect of the right foot behind the right great toe there is surrounding erythema and tenderness there are also areas of scattered scabs on both knees for which the patient said he acquired these when he was crawling in his apartment when he could not walk     Assessment & Plan:  I personally reviewed all images and lab data in the [facility] system as well as any outside material available during this office visit and agree with the  radiology impressions.   Multiple subsegmental pulmonary emboli without acute cor pulmonale (HCC) Chronic pulmonary emboli on Xarelto may yet need to discontinue Xarelto bleeding continues we will follow-up CBC  Protrusion of lumbar intervertebral disc I have referred this patient back to orthopedics hopefully will achieve this visit  Due to chronic pain I did refill the oxycodone  Abscess or cellulitis of foot Cellulitis anterior aspect of the right foot from an abrasion the secondarily infected Will give doxycycline 100 mg 1 twice daily for 7 days  Right  foot drop Sudden onset of right foot drop rest of neurologic exam unremarkable  Referral to orthopedics to evaluate spine   Diagnoses and all orders for this visit:  Lumbar radiculopathy -     Ambulatory referral to Orthopedic Surgery  Multiple subsegmental pulmonary emboli without acute cor pulmonale (HCC) -     rivaroxaban (XARELTO) 20 MG TABS tablet; Take 1 tablet (20 mg total) by mouth daily with supper. -     CBC  with Differential/Platelet -     Comprehensive metabolic panel  Bipolar affective disorder, currently depressed, mild (HCC) -     DULoxetine (CYMBALTA) 60 MG capsule; Take 1 capsule (60 mg total) by mouth daily.  GAD (generalized anxiety disorder) -     DULoxetine (CYMBALTA) 60 MG capsule; Take 1 capsule (60 mg total) by mouth daily.  Right foot drop -     Ambulatory referral to Orthopedic Surgery  Abscess or cellulitis of foot -     CBC with Differential/Platelet  Protrusion of lumbar intervertebral disc  Other orders -     oxyCODONE (ROXICODONE) 15 MG immediate release tablet; Take 1 tablet (15 mg total) by mouth every 6 (six) hours as needed for pain. -     doxycycline (VIBRA-TABS) 100 MG tablet; Take 1 tablet (100 mg total) by mouth 2 (two) times daily.

## 2019-12-15 NOTE — Assessment & Plan Note (Signed)
Chronic pulmonary emboli on Xarelto may yet need to discontinue Xarelto bleeding continues we will follow-up CBC

## 2019-12-15 NOTE — Patient Instructions (Addendum)
Another referral to Ortho care will be made the co-pay is $100 we have no way of covering that you will have to cover that since you have not been able to apply for financial assistance  You will need to expedite your disability situation if not you will have to cease trying to get disabled and go through the financial assistance program I have no other recourse for you  I sent 1 more refill on the Roxicodone  Please take doxycycline twice a day until gone for the infection on your right foot  Please continue to follow-up with your mental health providers and take your mental health medications  Next visit with Dr. Joya Gaskins will be a video visit this will be in 1 month  A drug screen and labs were obtained  Blood counts obtained today  Dressing for the foot was obtained

## 2019-12-16 ENCOUNTER — Other Ambulatory Visit: Payer: Self-pay

## 2019-12-16 ENCOUNTER — Encounter (HOSPITAL_COMMUNITY): Payer: Self-pay | Admitting: *Deleted

## 2019-12-16 ENCOUNTER — Encounter: Payer: Self-pay | Admitting: Critical Care Medicine

## 2019-12-16 ENCOUNTER — Inpatient Hospital Stay (HOSPITAL_COMMUNITY): Payer: Self-pay

## 2019-12-16 ENCOUNTER — Inpatient Hospital Stay (HOSPITAL_COMMUNITY)
Admission: EM | Admit: 2019-12-16 | Discharge: 2019-12-20 | DRG: 682 | Disposition: A | Discharge status: Home / Self Care | Payer: Self-pay | Source: Ambulatory Visit | Attending: Internal Medicine | Admitting: Internal Medicine

## 2019-12-16 DIAGNOSIS — K567 Ileus, unspecified: Secondary | ICD-10-CM | POA: Diagnosis present

## 2019-12-16 DIAGNOSIS — Z882 Allergy status to sulfonamides status: Secondary | ICD-10-CM

## 2019-12-16 DIAGNOSIS — Z933 Colostomy status: Secondary | ICD-10-CM

## 2019-12-16 DIAGNOSIS — Z883 Allergy status to other anti-infective agents status: Secondary | ICD-10-CM

## 2019-12-16 DIAGNOSIS — C189 Malignant neoplasm of colon, unspecified: Secondary | ICD-10-CM | POA: Diagnosis present

## 2019-12-16 DIAGNOSIS — N179 Acute kidney failure, unspecified: Principal | ICD-10-CM

## 2019-12-16 DIAGNOSIS — Z6834 Body mass index (BMI) 34.0-34.9, adult: Secondary | ICD-10-CM

## 2019-12-16 DIAGNOSIS — M5126 Other intervertebral disc displacement, lumbar region: Secondary | ICD-10-CM

## 2019-12-16 DIAGNOSIS — K50919 Crohn's disease, unspecified, with unspecified complications: Secondary | ICD-10-CM

## 2019-12-16 DIAGNOSIS — M545 Low back pain: Secondary | ICD-10-CM | POA: Diagnosis present

## 2019-12-16 DIAGNOSIS — K435 Parastomal hernia without obstruction or  gangrene: Secondary | ICD-10-CM | POA: Diagnosis present

## 2019-12-16 DIAGNOSIS — E079 Disorder of thyroid, unspecified: Secondary | ICD-10-CM | POA: Diagnosis present

## 2019-12-16 DIAGNOSIS — G8929 Other chronic pain: Secondary | ICD-10-CM | POA: Diagnosis present

## 2019-12-16 DIAGNOSIS — R112 Nausea with vomiting, unspecified: Secondary | ICD-10-CM | POA: Diagnosis present

## 2019-12-16 DIAGNOSIS — L089 Local infection of the skin and subcutaneous tissue, unspecified: Secondary | ICD-10-CM

## 2019-12-16 DIAGNOSIS — F17210 Nicotine dependence, cigarettes, uncomplicated: Secondary | ICD-10-CM | POA: Diagnosis present

## 2019-12-16 DIAGNOSIS — E860 Dehydration: Secondary | ICD-10-CM | POA: Diagnosis present

## 2019-12-16 DIAGNOSIS — R1114 Bilious vomiting: Secondary | ICD-10-CM

## 2019-12-16 DIAGNOSIS — E876 Hypokalemia: Secondary | ICD-10-CM

## 2019-12-16 DIAGNOSIS — Z85038 Personal history of other malignant neoplasm of large intestine: Secondary | ICD-10-CM

## 2019-12-16 DIAGNOSIS — F1010 Alcohol abuse, uncomplicated: Secondary | ICD-10-CM

## 2019-12-16 DIAGNOSIS — R000 Tachycardia, unspecified: Secondary | ICD-10-CM | POA: Diagnosis present

## 2019-12-16 DIAGNOSIS — M21371 Foot drop, right foot: Secondary | ICD-10-CM | POA: Diagnosis present

## 2019-12-16 DIAGNOSIS — I4581 Long QT syndrome: Secondary | ICD-10-CM | POA: Diagnosis present

## 2019-12-16 DIAGNOSIS — K5090 Crohn's disease, unspecified, without complications: Secondary | ICD-10-CM | POA: Diagnosis present

## 2019-12-16 DIAGNOSIS — M79606 Pain in leg, unspecified: Secondary | ICD-10-CM | POA: Diagnosis present

## 2019-12-16 DIAGNOSIS — Z888 Allergy status to other drugs, medicaments and biological substances status: Secondary | ICD-10-CM

## 2019-12-16 DIAGNOSIS — IMO0002 Reserved for concepts with insufficient information to code with codable children: Secondary | ICD-10-CM | POA: Diagnosis present

## 2019-12-16 DIAGNOSIS — Z20822 Contact with and (suspected) exposure to covid-19: Secondary | ICD-10-CM | POA: Diagnosis present

## 2019-12-16 DIAGNOSIS — L03119 Cellulitis of unspecified part of limb: Secondary | ICD-10-CM

## 2019-12-16 DIAGNOSIS — L02619 Cutaneous abscess of unspecified foot: Secondary | ICD-10-CM

## 2019-12-16 DIAGNOSIS — Z86711 Personal history of pulmonary embolism: Secondary | ICD-10-CM | POA: Diagnosis present

## 2019-12-16 DIAGNOSIS — E669 Obesity, unspecified: Secondary | ICD-10-CM | POA: Diagnosis present

## 2019-12-16 DIAGNOSIS — Z7901 Long term (current) use of anticoagulants: Secondary | ICD-10-CM

## 2019-12-16 DIAGNOSIS — F250 Schizoaffective disorder, bipolar type: Secondary | ICD-10-CM

## 2019-12-16 DIAGNOSIS — S90811A Abrasion, right foot, initial encounter: Secondary | ICD-10-CM

## 2019-12-16 DIAGNOSIS — K859 Acute pancreatitis without necrosis or infection, unspecified: Secondary | ICD-10-CM | POA: Diagnosis present

## 2019-12-16 DIAGNOSIS — Z79899 Other long term (current) drug therapy: Secondary | ICD-10-CM

## 2019-12-16 DIAGNOSIS — Z9049 Acquired absence of other specified parts of digestive tract: Secondary | ICD-10-CM

## 2019-12-16 HISTORY — DX: Acute kidney failure, unspecified: N17.9

## 2019-12-16 HISTORY — DX: Malignant neoplasm of colon, unspecified: C18.9

## 2019-12-16 LAB — CBC WITH DIFFERENTIAL/PLATELET
Basophils Absolute: 0.1 10*3/uL (ref 0.0–0.2)
Basos: 1 %
EOS (ABSOLUTE): 0.2 10*3/uL (ref 0.0–0.4)
Eos: 1 %
Hematocrit: 42.4 % (ref 37.5–51.0)
Hemoglobin: 14.9 g/dL (ref 13.0–17.7)
Immature Grans (Abs): 0.8 10*3/uL — ABNORMAL HIGH (ref 0.0–0.1)
Immature Granulocytes: 4 %
Lymphocytes Absolute: 0.8 10*3/uL (ref 0.7–3.1)
Lymphs: 4 %
MCH: 31.2 pg (ref 26.6–33.0)
MCHC: 35.1 g/dL (ref 31.5–35.7)
MCV: 89 fL (ref 79–97)
Monocytes Absolute: 1.5 10*3/uL — ABNORMAL HIGH (ref 0.1–0.9)
Monocytes: 8 %
Neutrophils Absolute: 16.6 10*3/uL — ABNORMAL HIGH (ref 1.4–7.0)
Neutrophils: 82 %
Platelets: 178 10*3/uL (ref 150–450)
RBC: 4.77 x10E6/uL (ref 4.14–5.80)
RDW: 14.4 % (ref 11.6–15.4)
WBC: 20.1 10*3/uL (ref 3.4–10.8)

## 2019-12-16 LAB — CBC
HCT: 40.7 % (ref 39.0–52.0)
Hemoglobin: 14.2 g/dL (ref 13.0–17.0)
MCH: 31.6 pg (ref 26.0–34.0)
MCHC: 34.9 g/dL (ref 30.0–36.0)
MCV: 90.4 fL (ref 80.0–100.0)
Platelets: 158 10*3/uL (ref 150–400)
RBC: 4.5 MIL/uL (ref 4.22–5.81)
RDW: 13.5 % (ref 11.5–15.5)
WBC: 15.4 10*3/uL — ABNORMAL HIGH (ref 4.0–10.5)
nRBC: 0 % (ref 0.0–0.2)

## 2019-12-16 LAB — COMPREHENSIVE METABOLIC PANEL
ALT: 39 IU/L (ref 0–44)
ALT: 37 U/L (ref 0–44)
AST: 32 IU/L (ref 0–40)
AST: 38 U/L (ref 15–41)
Albumin/Globulin Ratio: 1.4 (ref 1.2–2.2)
Albumin: 4.6 g/dL (ref 4.0–5.0)
Albumin: 3.5 g/dL (ref 3.5–5.0)
Alkaline Phosphatase: 130 IU/L — ABNORMAL HIGH (ref 48–121)
Alkaline Phosphatase: 91 U/L (ref 38–126)
Anion gap: 14 (ref 5–15)
BUN/Creatinine Ratio: 11 (ref 9–20)
BUN: 36 mg/dL — ABNORMAL HIGH (ref 6–20)
BUN: 34 mg/dL — ABNORMAL HIGH (ref 6–20)
Bilirubin Total: 0.6 mg/dL (ref 0.0–1.2)
CO2: 22 mmol/L (ref 20–29)
CO2: 27 mmol/L (ref 22–32)
Calcium: 9.9 mg/dL (ref 8.7–10.2)
Calcium: 8.9 mg/dL (ref 8.9–10.3)
Chloride: 87 mmol/L — ABNORMAL LOW (ref 96–106)
Chloride: 91 mmol/L — ABNORMAL LOW (ref 98–111)
Creatinine, Ser: 3.2 mg/dL — ABNORMAL HIGH (ref 0.76–1.27)
Creatinine, Ser: 3.6 mg/dL — ABNORMAL HIGH (ref 0.61–1.24)
GFR calc Af Amer: 27 mL/min/{1.73_m2} — ABNORMAL LOW (ref >59)
GFR calc Af Amer: 23 mL/min — ABNORMAL LOW (ref >60)
GFR calc non Af Amer: 23 mL/min/{1.73_m2} — ABNORMAL LOW (ref >59)
GFR calc non Af Amer: 20 mL/min — ABNORMAL LOW (ref >60)
Globulin, Total: 3.2 g/dL (ref 1.5–4.5)
Glucose, Bld: 123 mg/dL — ABNORMAL HIGH (ref 70–99)
Glucose: 127 mg/dL — ABNORMAL HIGH (ref 65–99)
Potassium: 2.9 mmol/L — ABNORMAL LOW (ref 3.5–5.2)
Potassium: 2.6 mmol/L — CL (ref 3.5–5.1)
Sodium: 132 mmol/L — ABNORMAL LOW (ref 134–144)
Sodium: 132 mmol/L — ABNORMAL LOW (ref 135–145)
Total Bilirubin: 1 mg/dL (ref 0.3–1.2)
Total Protein: 7.8 g/dL (ref 6.0–8.5)
Total Protein: 7.9 g/dL (ref 6.5–8.1)

## 2019-12-16 LAB — LIPASE, BLOOD: Lipase: 278 U/L — ABNORMAL HIGH (ref 11–51)

## 2019-12-16 LAB — MAGNESIUM: Magnesium: 1.5 mg/dL — ABNORMAL LOW (ref 1.7–2.4)

## 2019-12-16 MED ORDER — MORPHINE SULFATE (PF) 2 MG/ML IV SOLN
2.0000 mg | INTRAVENOUS | Status: DC | PRN
  Administered 2019-12-17 (×3): 4 mg via INTRAVENOUS
  Administered 2019-12-17 – 2019-12-18 (×2): 2 mg via INTRAVENOUS
  Administered 2019-12-18: 4 mg via INTRAVENOUS
  Filled 2019-12-16: qty 1
  Filled 2019-12-16 (×2): qty 2
  Filled 2019-12-16: qty 1
  Filled 2019-12-16 (×2): qty 2

## 2019-12-16 MED ORDER — FOLIC ACID 1 MG PO TABS
1.0000 mg | ORAL_TABLET | Freq: Every day | ORAL | Status: DC
  Administered 2019-12-17 – 2019-12-20 (×4): 1 mg via ORAL
  Filled 2019-12-16 (×4): qty 1

## 2019-12-16 MED ORDER — ACETAMINOPHEN 650 MG RE SUPP: 650.0000 mg | Freq: Four times a day (QID) | RECTAL | Status: DC | PRN

## 2019-12-16 MED ORDER — SODIUM CHLORIDE 0.9 % IV BOLUS
1000.0000 mL | Freq: Once | INTRAVENOUS | Status: AC
  Administered 2019-12-16: 1000 mL via INTRAVENOUS

## 2019-12-16 MED ORDER — THIAMINE HCL 100 MG/ML IJ SOLN: 100.0000 mg | Freq: Every day | INTRAMUSCULAR | Status: DC

## 2019-12-16 MED ORDER — LORAZEPAM 1 MG PO TABS: 1.0000 mg | ORAL_TABLET | ORAL | Status: DC | PRN

## 2019-12-16 MED ORDER — POTASSIUM CHLORIDE 10 MEQ/100ML IV SOLN
10.0000 meq | INTRAVENOUS | Status: AC
  Administered 2019-12-16 – 2019-12-17 (×3): 10 meq via INTRAVENOUS
  Filled 2019-12-16 (×4): qty 100

## 2019-12-16 MED ORDER — CEFAZOLIN SODIUM-DEXTROSE 2-4 GM/100ML-% IV SOLN
2.0000 g | Freq: Three times a day (TID) | INTRAVENOUS | Status: DC
  Administered 2019-12-17 – 2019-12-18 (×5): 2 g via INTRAVENOUS
  Filled 2019-12-16 (×5): qty 100

## 2019-12-16 MED ORDER — QUETIAPINE FUMARATE 100 MG PO TABS
300.0000 mg | ORAL_TABLET | Freq: Every day | ORAL | Status: DC
  Administered 2019-12-17 – 2019-12-19 (×4): 300 mg via ORAL
  Filled 2019-12-16: qty 3
  Filled 2019-12-16: qty 1
  Filled 2019-12-16: qty 3
  Filled 2019-12-16: qty 1
  Filled 2019-12-16: qty 3

## 2019-12-16 MED ORDER — SIMETHICONE 80 MG PO CHEW: 80.0000 mg | CHEWABLE_TABLET | Freq: Four times a day (QID) | ORAL | Status: DC | PRN

## 2019-12-16 MED ORDER — QUETIAPINE FUMARATE 50 MG PO TABS
50.0000 mg | ORAL_TABLET | Freq: Two times a day (BID) | ORAL | Status: DC
  Administered 2019-12-17 – 2019-12-20 (×7): 50 mg via ORAL
  Filled 2019-12-16 (×9): qty 1

## 2019-12-16 MED ORDER — MAGNESIUM SULFATE IN D5W 1-5 GM/100ML-% IV SOLN
1.0000 g | Freq: Once | INTRAVENOUS | Status: AC
  Administered 2019-12-16: 1 g via INTRAVENOUS
  Filled 2019-12-16: qty 100

## 2019-12-16 MED ORDER — MORPHINE SULFATE (PF) 4 MG/ML IV SOLN
4.0000 mg | Freq: Once | INTRAVENOUS | Status: AC
  Administered 2019-12-16: 4 mg via INTRAVENOUS
  Filled 2019-12-16: qty 1

## 2019-12-16 MED ORDER — THIAMINE HCL 100 MG PO TABS
100.0000 mg | ORAL_TABLET | Freq: Every day | ORAL | Status: DC
  Administered 2019-12-17 – 2019-12-20 (×4): 100 mg via ORAL
  Filled 2019-12-16 (×4): qty 1

## 2019-12-16 MED ORDER — LORAZEPAM 2 MG/ML IJ SOLN: 1.0000 mg | INTRAMUSCULAR | Status: DC | PRN

## 2019-12-16 MED ORDER — HEPARIN (PORCINE) 25000 UT/250ML-% IV SOLN
1800.0000 [IU]/h | INTRAVENOUS | Status: DC
  Administered 2019-12-17: 1600 [IU]/h via INTRAVENOUS
  Administered 2019-12-17: 1800 [IU]/h via INTRAVENOUS
  Filled 2019-12-16 (×3): qty 250

## 2019-12-16 MED ORDER — ADULT MULTIVITAMIN W/MINERALS CH
1.0000 | ORAL_TABLET | Freq: Every day | ORAL | Status: DC
  Administered 2019-12-17 – 2019-12-20 (×4): 1 via ORAL
  Filled 2019-12-16 (×4): qty 1

## 2019-12-16 MED ORDER — ONDANSETRON HCL 4 MG/2ML IJ SOLN
4.0000 mg | Freq: Four times a day (QID) | INTRAMUSCULAR | Status: DC | PRN
  Administered 2019-12-17: 4 mg via INTRAVENOUS
  Filled 2019-12-16 (×2): qty 2

## 2019-12-16 MED ORDER — ACETAMINOPHEN 325 MG PO TABS: 650.0000 mg | ORAL_TABLET | Freq: Four times a day (QID) | ORAL | Status: DC | PRN

## 2019-12-16 MED ORDER — DULOXETINE HCL 60 MG PO CPEP
60.0000 mg | ORAL_CAPSULE | Freq: Every day | ORAL | Status: DC
  Administered 2019-12-17 – 2019-12-20 (×4): 60 mg via ORAL
  Filled 2019-12-16 (×5): qty 1

## 2019-12-16 MED ORDER — LACTATED RINGERS IV SOLN: INTRAVENOUS | Status: DC

## 2019-12-16 MED ORDER — POTASSIUM CHLORIDE CRYS ER 20 MEQ PO TBCR
40.0000 meq | EXTENDED_RELEASE_TABLET | Freq: Once | ORAL | Status: AC
  Administered 2019-12-17: 40 meq via ORAL
  Filled 2019-12-16: qty 2

## 2019-12-16 MED ORDER — ONDANSETRON HCL 4 MG PO TABS: 4.0000 mg | ORAL_TABLET | Freq: Four times a day (QID) | ORAL | Status: DC | PRN

## 2019-12-16 MED ORDER — DIVALPROEX SODIUM 500 MG PO DR TAB
500.0000 mg | DELAYED_RELEASE_TABLET | Freq: Two times a day (BID) | ORAL | Status: DC
  Administered 2019-12-17 – 2019-12-20 (×8): 500 mg via ORAL
  Filled 2019-12-16: qty 1
  Filled 2019-12-16: qty 2
  Filled 2019-12-16: qty 1
  Filled 2019-12-16: qty 2
  Filled 2019-12-16 (×4): qty 1

## 2019-12-16 NOTE — ED Notes (Signed)
Dr Dorothyann Gibbs notified of critical lab: Potassium 2.6

## 2019-12-16 NOTE — ED Provider Notes (Signed)
[facility] EMERGENCY DEPARTMENT Provider Note   CSN: [id number] Arrival date & time: 12/16/19  1025     History Chief Complaint  Patient presents with   Abnormal Lab    Juan Hester is a 39 y.o. male with a past medical history of Crohn's disease, colon cancer status post sigmoid resection with colostomy, prior PE on Xarelto presenting to the ED from PCPs office for abnormal lab work. He went to his PCPs office today for a follow-up visit. He was complaining of lower back pain and wound to his right foot. For the prior 10 days (up until 3 days ago) states that he was fasting where he did not eat anything but did drink plenty of fluids. States that he was doing this "for spiritual reasons and because I could not afford food." 3 days ago started having some vomiting which prompted him to stop fasting. He states that he was vomiting up bile. He did not take any of his home medications for the past 3 days as he was concerned that he would vomit afterwards. Denies any abdominal pain or diarrhea. PCP check labs today and found to have an AKI, sent to the ER for further evaluation. He was also told to have an x-ray done of his right foot to evaluate for ?osteomyelitis. He denies any chest pain, shortness of breath, headache, blurry vision, numbness in arms or legs, urinary symptoms. He was told in the past that he had "abnormal kidney function but it was just coming and going."  HPI     Past Medical History:  Diagnosis Date   Crohn disease ([place])    PE (pulmonary thromboembolism) ([place])    Thyroid disease     Patient Active Problem List   Diagnosis Date Noted   Acute renal failure ([place]) 12/16/2019   Hypokalemia 12/16/2019   Right foot drop 12/15/2019   Abscess or cellulitis of foot 12/15/2019   GAD (generalized anxiety disorder) 10/20/2019   MDD (major depressive disorder) 10/20/2019   PTSD (post-traumatic stress disorder) 10/20/2019   Lumbar radiculopathy  10/19/2019   Bipolar affective disorder, currently depressed, mild ([place]) 10/16/2019   Protrusion of lumbar intervertebral disc 10/05/2019   Localized edema 08/18/2019   Multiple subsegmental pulmonary emboli without acute cor pulmonale ([place]) 07/21/2019   Bipolar 1 disorder ([place]) 07/21/2019   History of colon cancer 07/21/2019   Post-traumatic stress disorder, unspecified 06/03/2019   Schizoaffective disorder, bipolar type ([place]) 06/03/2019   Nicotine dependence, cigarettes, uncomplicated 63/84/5364   Crohn's disease with complication ([place]) 68/06/2120   Colostomy, evaluate ([place]) 05/05/2019    Past Surgical History:  Procedure Laterality Date   ABDOMINAL SURGERY     COLON SURGERY     IR RADIOLOGIST EVAL & MGMT  08/31/2019   SMALL INTESTINE SURGERY         History reviewed. No pertinent family history.  Social History   Tobacco Use   Smoking status: Current Every Day Smoker    Packs/day: 1.00   Smokeless tobacco: Never Used  Vaping Use   Vaping Use: Never used  Substance Use Topics   Alcohol use: Yes   Drug use: Not Currently    Home Medications Prior to Admission medications   Medication Sig Start Date End Date Taking? Authorizing Provider  diphenoxylate-atropine (LOMOTIL) 2.5-0.025 MG tablet Take 1 tablet by mouth in the morning, at noon, in the evening, and at bedtime.   Yes [provider]  divalproex (DEPAKOTE) 500 MG DR tablet Take 1 tablet (  500 mg total) by mouth 2 (two) times daily. 11/18/19  Yes [name], MD  doxycycline (VIBRA-TABS) 100 MG tablet Take 1 tablet (100 mg total) by mouth 2 (two) times daily. 12/15/19  Yes [name], MD  DULoxetine (CYMBALTA) 60 MG capsule Take 1 capsule (60 mg total) by mouth daily. 12/15/19  Yes [name], MD  oxyCODONE (ROXICODONE) 15 MG immediate release tablet Take 1 tablet (15 mg total) by mouth every 6 (six) hours as needed for pain. 12/15/19  Yes [name], MD  QUEtiapine  (SEROQUEL) 300 MG tablet Take 1 tablet (300 mg total) by mouth at bedtime. 11/18/19  Yes [name], MD  QUEtiapine (SEROQUEL) 50 MG tablet Take 1 tablet (50 mg total) by mouth 2 (two) times daily. 11/18/19  Yes [name], MD  rivaroxaban (XARELTO) 20 MG TABS tablet Take 1 tablet (20 mg total) by mouth daily with supper. 12/15/19  Yes [name], MD  simethicone (MYLICON) 80 MG chewable tablet Chew 80 mg by mouth every 6 (six) hours as needed for flatulence.   Yes [provider]  naloxone [facility]) nasal spray 4 mg/0.1 mL Use as needed for overdose symptoms from opiate 10/19/19   [name], MD    Allergies    Azathioprine, Ciprofloxacin, Mesalamine, Metronidazole, Sulfa antibiotics, Barium sulfate, and Barium-containing compounds  Review of Systems   Review of Systems  Constitutional: Negative for appetite change, chills and fever.  HENT: Negative for ear pain, rhinorrhea, sneezing and sore throat.   Eyes: Negative for photophobia and visual disturbance.  Respiratory: Negative for cough, chest tightness, shortness of breath and wheezing.   Cardiovascular: Negative for chest pain and palpitations.  Gastrointestinal: Positive for nausea and vomiting. Negative for abdominal pain, blood in stool, constipation and diarrhea.  Genitourinary: Negative for dysuria, hematuria and urgency.  Musculoskeletal: Negative for myalgias.  Skin: Negative for rash.  Neurological: Negative for dizziness, weakness and light-headedness.    Physical Exam Updated Vital Signs BP (!) 143/95    Pulse 99    Temp 98.3 F (36.8 C) (Oral)    Resp (!) 25    Ht 6' 1"  (1.854 m)    Wt 115.7 kg    SpO2 99%    BMI 33.64 kg/m   Physical Exam Vitals and nursing note reviewed.  Constitutional:      General: He is not in acute distress.    Appearance: He is well-developed. He is obese. He is not diaphoretic.  HENT:     Head: Normocephalic and atraumatic.     Nose: Nose normal.  Eyes:      General: No scleral icterus.       Right eye: No discharge.        Left eye: No discharge.     Conjunctiva/sclera: Conjunctivae normal.  Cardiovascular:     Rate and Rhythm: Regular rhythm. Tachycardia present.     Heart sounds: Normal heart sounds. No murmur heard.  No friction rub. No gallop.   Pulmonary:     Effort: Pulmonary effort is normal. No respiratory distress.     Breath sounds: Normal breath sounds.  Abdominal:     General: Bowel sounds are normal. There is no distension.     Palpations: Abdomen is soft.     Tenderness: There is no abdominal tenderness. There is no guarding.     Comments: Abdomen is soft, nontender nondistended.  Musculoskeletal:        General: Normal range  of motion.     Cervical back: Normal range of motion and neck supple.  Skin:    General: Skin is warm and dry.     Findings: Wound present. No rash.     Comments: Wound on base of R great toe with surrounding erythema.  Neurological:     Mental Status: He is alert.     Motor: No abnormal muscle tone.     Coordination: Coordination normal.     ED Results / Procedures / Treatments   Labs (all labs ordered are listed, but only abnormal results are displayed) Labs Reviewed  LIPASE, BLOOD - Abnormal; Notable for the following components:      Result Value   Lipase 278 (*)    All other components within normal limits  COMPREHENSIVE METABOLIC PANEL - Abnormal; Notable for the following components:   Sodium 132 (*)    Potassium 2.6 (*)    Chloride 91 (*)    Glucose, Bld 123 (*)    BUN 34 (*)    Creatinine, Ser 3.60 (*)    GFR calc non Af Amer 20 (*)    GFR calc Af Amer 23 (*)    All other components within normal limits  CBC - Abnormal; Notable for the following components:   WBC 15.4 (*)    All other components within normal limits  SARS CORONAVIRUS 2 BY RT PCR (HOSPITAL ORDER, [facility])  URINALYSIS, ROUTINE W REFLEX MICROSCOPIC  MAGNESIUM     EKG None  Radiology No results found.  Procedures Procedures (including critical care time)  Medications Ordered in ED Medications  potassium chloride SA (KLOR-CON) CR tablet 40 mEq (has no administration in time range)  potassium chloride 10 mEq in 100 mL IVPB (10 mEq Intravenous New Bag/Given 12/16/19 2112)  morphine 4 MG/ML injection 4 mg (has no administration in time range)  magnesium sulfate IVPB 1 g 100 mL (1 g Intravenous New Bag/Given 12/16/19 2147)  sodium chloride 0.9 % bolus 1,000 mL (1,000 mLs Intravenous New Bag/Given 12/16/19 2112)    ED Course  I have reviewed the triage vital signs and the nursing notes.  Pertinent labs & imaging results that were available during my care of the patient were reviewed by me and considered in my medical decision making (see chart for details).  Clinical Course as of Dec 15 2152  Thu Dec 16, 2019  2056 Potassium(!!): 2.6 [HK]  2056 Lipase(!): 278 [HK]  2056 Creatinine(!): 3.60 [HK]    Clinical Course User Index [HK] Delia Heady, PA-C   MDM Rules/Calculators/A&P                          39 year old male with past medical history of Crohn's disease, colon cancer status post sigmoid resection with colostomy, prior PE on Xarelto presenting to the ED from PCPs office for abnormal lab work.  Admits that up until 3 days ago he was on a 10-day fast where he did not eat any food but did drink fluids.  States that he started vomiting up bile 3 days ago.  This caused him to not be able to take any of his home medications including his anticoagulant medication.  Denies any abdominal pain.  Denies daily alcohol use.  He was seen by his PCP today and found to have an AKI sent to the ER for further evaluation.  He also noticed for the past 3 to 4 weeks that he  had a wound on his right great toe.  He was told to come to the ED for x-ray as well.  On exam abdomen is soft, nontender nondistended.  Patient initially tachycardic.  Lab work significant  for creatinine of 3.6, his baseline appears around 1.4, hypokalemia of 2.6 and lipase of 278.  His abdomen is nontender. I feel his lipase is elevated from his recent fasting.  I have ordered IV fluids, IV potassium and magnesium.  He will need to be admitted for further management of his AKI, hypokalemia in the setting of decreased PO intake.    Portions of this note were generated with [employer]. Dictation errors may occur despite best attempts at proofreading.  Final Clinical Impression(s) / ED Diagnoses Final diagnoses:  AKI (acute kidney injury) ([place])  Hypokalemia  Dehydration  Acute pancreatitis, unspecified complication status, unspecified pancreatitis type    Rx / DC Orders ED Discharge Orders    None       Delia Heady, PA-C 12/16/19 2154    Maudie Flakes, MD 12/16/19 2309

## 2019-12-16 NOTE — ED Notes (Signed)
This RN called x-ray and informed dept that pt is ready for imaging transportation.

## 2019-12-16 NOTE — H&P (Signed)
History and Physical    [name] FVC:[id number] DOB: 10/25/1980 DOA: 12/16/2019  PCP: [name], MD  Patient coming from: Home  I have personally briefly reviewed patient's old medical records in [facility]  Chief Complaint: Abnormal labs, N/V  HPI: Juan Hester is a 39 y.o. male with medical history significant of Crohn's disease, colon cancer s/p colectomy and ostomy, schizoaffective disorder, EtOH use / abuse.  Prior multifocal PEs on Xarelto.  Pt went to PCPs office today for f/u visit.  He was complaining of lower back pain and wound to his right foot.  For the prior 10 days (up until 3 days ago) states that he was fasting where he did not eat anything but did drink plenty of fluids. States that he was doing this "for spiritual reasons and because I could not afford food." 3 days ago started having some vomiting which prompted him to stop fasting. He states that he was vomiting up bile. He did not take any of his home medications for the past 3 days as he was concerned that he would vomit afterwards. Denies any abdominal pain or diarrhea. PCP check labs today and found to have an AKI, sent to the ER for further evaluation.   ED Course: WBC 15k, Creat 3.6 up from 1.2 baseline, BUN 34, K 2.6, Mg 1.5.  Lipase 278, LFTs nl.  Hospitalist asked to admit secondary to AKF.  Last drank EtOH yesterday evening.   Review of Systems: As per HPI, otherwise all review of systems negative.  Past Medical History:  Diagnosis Date  . Colon cancer ([place])   . Crohn disease ([place])   . PE (pulmonary thromboembolism) ([place])   . Thyroid disease     Past Surgical History:  Procedure Laterality Date  . ABDOMINAL SURGERY    . COLON SURGERY    . IR RADIOLOGIST EVAL & MGMT  08/31/2019  . SMALL INTESTINE SURGERY       reports that he has been smoking. He has been smoking about 1.00 pack per day. He has never used smokeless tobacco. He reports current alcohol use. He reports  previous drug use.  Allergies  Allergen Reactions  . Azathioprine Other (See Comments)    Pt does not recall reaction    . Ciprofloxacin Other (See Comments)    Pt does not recall reaction    . Mesalamine Other (See Comments)    Pt does not recall reaction  . Metronidazole Other (See Comments)    Pt does not recall reaction   . Sulfa Antibiotics Other (See Comments)    Pt does not recall reaction   . Barium Sulfate Rash  . Barium-Containing Compounds Rash    History reviewed. No pertinent family history.   Prior to Admission medications   Medication Sig Start Date End Date Taking? Authorizing Provider  diphenoxylate-atropine (LOMOTIL) 2.5-0.025 MG tablet Take 1 tablet by mouth in the morning, at noon, in the evening, and at bedtime.   Yes [provider]  divalproex (DEPAKOTE) 500 MG DR tablet Take 1 tablet (500 mg total) by mouth 2 (two) times daily. 11/18/19  Yes [name], MD  DULoxetine (CYMBALTA) 60 MG capsule Take 1 capsule (60 mg total) by mouth daily. 12/15/19  Yes [name], MD  naproxen sodium (ALEVE) 220 MG tablet Take 220 mg by mouth daily.   Yes [provider]  oxyCODONE (ROXICODONE) 15 MG immediate release tablet Take 1 tablet (15 mg total) by mouth every 6 (  six) hours as needed for pain. 12/15/19  Yes [name], MD  QUEtiapine (SEROQUEL) 300 MG tablet Take 1 tablet (300 mg total) by mouth at bedtime. 11/18/19  Yes [name], MD  QUEtiapine (SEROQUEL) 50 MG tablet Take 1 tablet (50 mg total) by mouth 2 (two) times daily. 11/18/19  Yes [name], MD  rivaroxaban (XARELTO) 20 MG TABS tablet Take 1 tablet (20 mg total) by mouth daily with supper. 12/15/19  Yes [name], MD  simethicone (MYLICON) 80 MG chewable tablet Chew 80 mg by mouth every 6 (six) hours as needed for flatulence.   Yes [provider]  doxycycline (VIBRA-TABS) 100 MG tablet Take 1 tablet (100 mg total) by mouth 2 (two) times  daily. Patient not taking: Reported on 12/16/2019 12/15/19   [name], MD  naloxone [facility]) nasal spray 4 mg/0.1 mL Use as needed for overdose symptoms from opiate 10/19/19   [name], MD    Physical Exam: Vitals:   12/16/19 2037 12/16/19 2100 12/16/19 2113 12/16/19 2130  BP: (!) 125/94 (!) 129/95  (!) 143/95  Pulse: (!) 105 (!) 103  99  Resp: 18 (!) 21  (!) 25  Temp: 98.3 F (36.8 C)     TempSrc: Oral     SpO2: 100% 100%  99%  Weight:   115.7 kg   Height:   6' 1"  (1.854 m)     Constitutional: NAD, calm, comfortable Eyes: PERRL, lids and conjunctivae normal ENMT: Mucous membranes are moist. Posterior pharynx clear of any exudate or lesions.Normal dentition.  Neck: normal, supple, no masses, no thyromegaly Respiratory: clear to auscultation bilaterally, no wheezing, no crackles. Normal respiratory effort. No accessory muscle use.  Cardiovascular: Regular rate and rhythm, no murmurs / rubs / gallops. No extremity edema. 2+ pedal pulses. No carotid bruits.  Abdomen: no tenderness, no masses palpated. No hepatosplenomegaly. Bowel sounds positive.  Musculoskeletal: no clubbing / cyanosis. No joint deformity upper and lower extremities. Good ROM, no contractures. Normal muscle tone.  Skin: Scab from abrasion to dorsal surface of R foot, mild surrounding erythema. Neurologic: CN 2-12 grossly intact. Sensation intact, DTR normal. Strength 5/5 in all 4.  Psychiatric: Normal judgment and insight. Alert and oriented x 3. Normal mood.    Labs on Admission: I have personally reviewed following labs and imaging studies  CBC: Recent Labs  Lab 12/15/19 1133 12/16/19 1124  WBC 20.1* 15.4*  NEUTROABS 16.6*  --   HGB 14.9 14.2  HCT 42.4 40.7  MCV 89 90.4  PLT 178 270   Basic Metabolic Panel: Recent Labs  Lab 12/15/19 1133 12/16/19 1124 12/16/19 2046  NA 132* 132*  --   K 2.9* 2.6*  --   CL 87* 91*  --   CO2 22 27  --   GLUCOSE 127* 123*  --   BUN 36* 34*  --    CREATININE 3.20* 3.60*  --   CALCIUM 9.9 8.9  --   MG  --   --  1.5*   GFR: Estimated Creatinine Clearance: 36.7 mL/min (A) (by C-G formula based on SCr of 3.6 mg/dL (H)). Liver Function Tests: Recent Labs  Lab 12/15/19 1133 12/16/19 1124  AST 32 38  ALT 39 37  ALKPHOS 130* 91  BILITOT 0.6 1.0  PROT 7.8 7.9  ALBUMIN 4.6 3.5   Recent Labs  Lab 12/16/19 1124  LIPASE 278*   No results for input(s): AMMONIA in the last 168 hours. Coagulation  Profile: No results for input(s): INR, PROTIME in the last 168 hours. Cardiac Enzymes: No results for input(s): CKTOTAL, CKMB, CKMBINDEX, TROPONINI in the last 168 hours. BNP (last 3 results) No results for input(s): PROBNP in the last 8760 hours. HbA1C: No results for input(s): HGBA1C in the last 72 hours. CBG: No results for input(s): GLUCAP in the last 168 hours. Lipid Profile: No results for input(s): CHOL, HDL, LDLCALC, TRIG, CHOLHDL, LDLDIRECT in the last 72 hours. Thyroid Function Tests: No results for input(s): TSH, T4TOTAL, FREET4, T3FREE, THYROIDAB in the last 72 hours. Anemia Panel: No results for input(s): VITAMINB12, FOLATE, FERRITIN, TIBC, IRON, RETICCTPCT in the last 72 hours. Urine analysis:    Component Value Date/Time   APPEARANCEUR Clear 10/26/2019 1511   GLUCOSEU Negative 10/26/2019 1511   BILIRUBINUR Negative 10/26/2019 1511   PROTEINUR 1+ (A) 10/26/2019 1511   NITRITE Negative 10/26/2019 1511   LEUKOCYTESUR Negative 10/26/2019 1511    Radiological Exams on Admission: No results found.  EKG: Independently reviewed.  Assessment/Plan Principal Problem:   Acute renal failure (HCC) Active Problems:   Crohn's disease with complication (HCC)   History of colon cancer   ETOH abuse   Schizoaffective disorder, bipolar type ([place])   Abscess or cellulitis of foot   Colostomy status ([place])   History of pulmonary embolism   Nausea and vomiting    1. AKF - 1. Most suspicious of dehydration / pre-renal /  ATN picture given the poor PO intake for past 13 days, severe N/V for past 3 days. 2. IVF: 1L NS bolus in ED then 150 cc/hr LR 3. Strict intake and output 4. Getting non-contrast CT abd/pelvis (see below) 5. UA pending 6. Repeat BMP in AM 7. Nephro consult in AM if not rapidly improving 2. N/V - 1. Lipase elevation could be acute pancreatitis, or could just be from fasting 2. History concerning however given h/o colon cancer s/p colectomy with ostomy present as well as h/o crohn's disease. 3. Getting non-contrast CT abd/pelvis to r/o SBO, etc 4. Zofran 5. NPO except for sips with meds for the moment pending CT results 6. Morphine PRN pain 3. H/o PE - 1. Hold xarelto in setting of AKF (hasnt taken in past 3 days anyhow) 2. Heparin gtt per pharm consult 4. EtOH abuse - CIWA 5. Cellulitis of foot - 1. Ancef 2. Getting DG of foot to make sure no osteomyelitis present 6. Hypomagnesemia, hypokalemia - 1. Replace 7. Schizoaffective disorder - 1. Cont seroquel 2. Cont depakote 3. Cont cymbalta  DVT prophylaxis: Heparin gtt Code Status: Full Family Communication: No family in room Disposition Plan: Home after AKF resolved and tolerating POs Consults called: None Admission status: Admit to inpatient  Severity of Illness: The appropriate patient status for this patient is INPATIENT. Inpatient status is judged to be reasonable and necessary in order to provide the required intensity of service to ensure the patient's safety. The patient's presenting symptoms, physical exam findings, and initial radiographic and laboratory data in the context of their chronic comorbidities is felt to place them at high risk for further clinical deterioration. Furthermore, it is not anticipated that the patient will be medically stable for discharge from the hospital within 2 midnights of admission. The following factors support the patient status of inpatient.   IP status due to AKF with creat 3.6 up from  baseline 1.2.  * I certify that at the point of admission it is my clinical judgment that the patient will require inpatient hospital  care spanning beyond 2 midnights from the point of admission due to high intensity of service, high risk for further deterioration and high frequency of surveillance required.*    Rohan Juenger M. DO [facility]  How to contact the [facility] Attending or Consulting provider [place] or covering provider during after hours [place], for this patient?  1. Check the care team in [facility] and look for a) attending/consulting [facility] provider listed and b) the [facility] team listed 2. Log into [email or web address]  Amion Physician Scheduling and messaging for groups and whole hospitals  On call and physician scheduling software for group practices, residents, hospitalists and other medical providers for call, clinic, rotation and shift schedules. On[employer] is a hospital-wide system for scheduling doctors and paging doctors on call. EasyPlot is for scientific plotting and data analysis.  [email or web address]  and use [place] universal password to access. If you do not have the password, please contact the hospital operator.  3. Locate the [facility] provider you are looking for under [facility] and page to a number that you can be directly reached. 4. If you still have difficulty reaching the provider, please page the [facility] (Director on Call) for the Hospitalists listed on amion for assistance.  12/16/2019, 10:32 PM

## 2019-12-16 NOTE — Progress Notes (Signed)
ANTICOAGULATION CONSULT NOTE - Initial Consult  Pharmacy Consult for heparin Indication: hx PE  Allergies  Allergen Reactions  . Azathioprine Other (See Comments)    Pt does not recall reaction    . Ciprofloxacin Other (See Comments)    Pt does not recall reaction    . Mesalamine Other (See Comments)    Pt does not recall reaction  . Metronidazole Other (See Comments)    Pt does not recall reaction   . Sulfa Antibiotics Other (See Comments)    Pt does not recall reaction   . Barium Sulfate Rash  . Barium-Containing Compounds Rash    Patient Measurements: Height: 6' 1"  (185.4 cm) Weight: 115.7 kg (255 lb) IBW/kg (Calculated) : 79.9 Heparin Dosing Weight: 104.6kg  Vital Signs: Temp: 98.3 F (36.8 C) (09/02 2037) Temp Source: Oral (09/02 2037) BP: 143/95 (09/02 2130) Pulse Rate: 99 (09/02 2130)  Labs: Recent Labs    12/15/19 1133 12/16/19 1124  HGB 14.9 14.2  HCT 42.4 40.7  PLT 178 158  CREATININE 3.20* 3.60*    Estimated Creatinine Clearance: 36.7 mL/min (A) (by C-G formula based on SCr of 3.6 mg/dL (H)).   Medical History: Past Medical History:  Diagnosis Date  . Crohn disease ([place])   . PE (pulmonary thromboembolism) ([place])   . Thyroid disease     Medications:  Infusions:  .  ceFAZolin (ANCEF) IV    . heparin    . lactated ringers    . magnesium sulfate bolus IVPB 1 g (12/16/19 2147)  . potassium chloride 10 mEq (12/16/19 2221)    Assessment: 8 yom presented to the ED with an AKI. He is on chronic xarelto due to PE but will switch to IV heparin because of AKI. Baseline CBC is WNL. He has missed several days of his xarelto due to vomiting.   Goal of Therapy:  Heparin level 0.3-0.7 units/ml aPTT 66-102 seconds Monitor platelets by anticoagulation protocol: Yes   Plan:  Heparin gtt 1600 units/hr Check an 8 hr heparin and aPTT Daily heparin level and CBC F/u ability to resume xarelto  Dreamer Carillo, Rande Lawman 12/16/2019,10:27 PM

## 2019-12-16 NOTE — ED Triage Notes (Signed)
Pt reports going to pcp for routine check up, was called today to come here due to possible renal disease. No acute distress is noted at triage.

## 2019-12-17 ENCOUNTER — Inpatient Hospital Stay (HOSPITAL_COMMUNITY): Payer: Self-pay

## 2019-12-17 LAB — URINALYSIS, COMPLETE (UACMP) WITH MICROSCOPIC
Bilirubin Urine: NEGATIVE
Glucose, UA: NEGATIVE mg/dL
Ketones, ur: NEGATIVE mg/dL
Leukocytes,Ua: NEGATIVE
Nitrite: NEGATIVE
Protein, ur: NEGATIVE mg/dL
Specific Gravity, Urine: 1.011 (ref 1.005–1.030)
pH: 5 (ref 5.0–8.0)

## 2019-12-17 LAB — COMPREHENSIVE METABOLIC PANEL
ALT: 31 U/L (ref 0–44)
AST: 32 U/L (ref 15–41)
Albumin: 3.1 g/dL — ABNORMAL LOW (ref 3.5–5.0)
Alkaline Phosphatase: 86 U/L (ref 38–126)
Anion gap: 15 (ref 5–15)
BUN: 37 mg/dL — ABNORMAL HIGH (ref 6–20)
CO2: 21 mmol/L — ABNORMAL LOW (ref 22–32)
Calcium: 8.2 mg/dL — ABNORMAL LOW (ref 8.9–10.3)
Chloride: 97 mmol/L — ABNORMAL LOW (ref 98–111)
Creatinine, Ser: 3.42 mg/dL — ABNORMAL HIGH (ref 0.61–1.24)
GFR calc Af Amer: 25 mL/min — ABNORMAL LOW (ref >60)
GFR calc non Af Amer: 21 mL/min — ABNORMAL LOW (ref >60)
Glucose, Bld: 100 mg/dL — ABNORMAL HIGH (ref 70–99)
Potassium: 2.8 mmol/L — ABNORMAL LOW (ref 3.5–5.1)
Sodium: 133 mmol/L — ABNORMAL LOW (ref 135–145)
Total Bilirubin: 1 mg/dL (ref 0.3–1.2)
Total Protein: 7.1 g/dL (ref 6.5–8.1)

## 2019-12-17 LAB — BASIC METABOLIC PANEL
Anion gap: 12 (ref 5–15)
Anion gap: 12 (ref 5–15)
BUN: 36 mg/dL — ABNORMAL HIGH (ref 6–20)
BUN: 34 mg/dL — ABNORMAL HIGH (ref 6–20)
CO2: 24 mmol/L (ref 22–32)
CO2: 24 mmol/L (ref 22–32)
Calcium: 8.1 mg/dL — ABNORMAL LOW (ref 8.9–10.3)
Calcium: 8.3 mg/dL — ABNORMAL LOW (ref 8.9–10.3)
Chloride: 97 mmol/L — ABNORMAL LOW (ref 98–111)
Chloride: 99 mmol/L (ref 98–111)
Creatinine, Ser: 3.48 mg/dL — ABNORMAL HIGH (ref 0.61–1.24)
Creatinine, Ser: 3.65 mg/dL — ABNORMAL HIGH (ref 0.61–1.24)
GFR calc Af Amer: 24 mL/min — ABNORMAL LOW (ref >60)
GFR calc Af Amer: 23 mL/min — ABNORMAL LOW (ref >60)
GFR calc non Af Amer: 21 mL/min — ABNORMAL LOW (ref >60)
GFR calc non Af Amer: 20 mL/min — ABNORMAL LOW (ref >60)
Glucose, Bld: 96 mg/dL (ref 70–99)
Glucose, Bld: 128 mg/dL — ABNORMAL HIGH (ref 70–99)
Potassium: 2.7 mmol/L — CL (ref 3.5–5.1)
Potassium: 3.3 mmol/L — ABNORMAL LOW (ref 3.5–5.1)
Sodium: 133 mmol/L — ABNORMAL LOW (ref 135–145)
Sodium: 135 mmol/L (ref 135–145)

## 2019-12-17 LAB — CBC
HCT: 33.5 % — ABNORMAL LOW (ref 39.0–52.0)
Hemoglobin: 11.7 g/dL — ABNORMAL LOW (ref 13.0–17.0)
MCH: 31.5 pg (ref 26.0–34.0)
MCHC: 34.9 g/dL (ref 30.0–36.0)
MCV: 90.1 fL (ref 80.0–100.0)
Platelets: 100 10*3/uL — ABNORMAL LOW (ref 150–400)
RBC: 3.72 MIL/uL — ABNORMAL LOW (ref 4.22–5.81)
RDW: 13.6 % (ref 11.5–15.5)
WBC: 7.8 10*3/uL (ref 4.0–10.5)

## 2019-12-17 LAB — HEPARIN LEVEL (UNFRACTIONATED)
Heparin Unfractionated: 0.21 IU/mL — ABNORMAL LOW (ref 0.30–0.70)
Heparin Unfractionated: <0.1 IU/mL — ABNORMAL LOW (ref 0.30–0.70)

## 2019-12-17 LAB — PHOSPHORUS: Phosphorus: 3.1 mg/dL (ref 2.5–4.6)

## 2019-12-17 LAB — MAGNESIUM: Magnesium: 1.6 mg/dL — ABNORMAL LOW (ref 1.7–2.4)

## 2019-12-17 LAB — LIPASE, BLOOD: Lipase: 271 U/L — ABNORMAL HIGH (ref 11–51)

## 2019-12-17 LAB — APTT: aPTT: 38 seconds — ABNORMAL HIGH (ref 24–36)

## 2019-12-17 LAB — SARS CORONAVIRUS 2 BY RT PCR (HOSPITAL ORDER, PERFORMED IN ~~LOC~~ HOSPITAL LAB): SARS Coronavirus 2: NEGATIVE

## 2019-12-17 MED ORDER — OXYCODONE HCL 5 MG PO TABS
15.0000 mg | ORAL_TABLET | Freq: Four times a day (QID) | ORAL | Status: DC | PRN
  Administered 2019-12-17 – 2019-12-20 (×6): 15 mg via ORAL
  Filled 2019-12-17 (×7): qty 3

## 2019-12-17 MED ORDER — MAGNESIUM SULFATE 2 GM/50ML IV SOLN
2.0000 g | Freq: Once | INTRAVENOUS | Status: AC
  Administered 2019-12-17: 2 g via INTRAVENOUS
  Filled 2019-12-17: qty 50

## 2019-12-17 MED ORDER — POTASSIUM CHLORIDE CRYS ER 20 MEQ PO TBCR
40.0000 meq | EXTENDED_RELEASE_TABLET | ORAL | Status: AC
  Administered 2019-12-17 (×2): 40 meq via ORAL
  Filled 2019-12-17 (×2): qty 2

## 2019-12-17 MED ORDER — HEPARIN (PORCINE) 25000 UT/250ML-% IV SOLN
2000.0000 [IU]/h | INTRAVENOUS | Status: DC
  Administered 2019-12-17: 2000 [IU]/h via INTRAVENOUS
  Administered 2019-12-18: 1900 [IU]/h via INTRAVENOUS
  Administered 2019-12-18: 2000 [IU]/h via INTRAVENOUS
  Administered 2019-12-19 (×2): 1900 [IU]/h via INTRAVENOUS
  Filled 2019-12-17 (×5): qty 250

## 2019-12-17 MED ORDER — HEPARIN BOLUS VIA INFUSION
2000.0000 [IU] | Freq: Once | INTRAVENOUS | Status: AC
  Administered 2019-12-17: 2000 [IU] via INTRAVENOUS
  Filled 2019-12-17: qty 2000

## 2019-12-17 NOTE — Progress Notes (Signed)
PROGRESS NOTE  Juan Hester  DOB: Mar 15, 1981  PCP: [name], MD QQV:[id number]  DOA: 12/16/2019  LOS: 1 day   Chief Complaint  Patient presents with  . Abnormal Lab   Brief narrative: Juan Hester is a 39 y.o. 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 Patient was sent to the ED on 12/16/2019 from PCPs office for abnormal labs. 10 days prior to presentation, patient started 'fasting for spiritual reasons' where he did not eat anything but drank plenty of fluids.  In about a week, he started having bilious vomiting.  He gave up fasting but could not tolerate any food. 9/1, pt went to PCPs office for right foot wound.  Routine labs showed elevated creatinine and hence patient was sent to the ED.  In the ED, patient was afebrile, hemodynamically stable, breathing in room air Labs showed WBC count elevated to 15.4 better than 20.1 the previous day, potassium low at 2.6, magnesium 1.5, creatinine elevated to 3.42, baseline 1.2, lipase elevated to 278, LFT normal. CT scan of abdomen pelvis did not show any acute abnormality.  Patient was admitted to hospital service for further evaluation management  Subjective: Patient was seen and examined this morning.  Pleasant young Caucasian male. No longer having nausea or vomiting.  Wants to eat. Chart reviewed.  Heart rate improving, blood pressure in normal range Labs repeated this morning with potassium still low at 2.8  Assessment/Plan: Acute kidney injury  -Due to dehydration related to poor oral intake. -Currently on LR at 150 mill per hour.  Will reduce to 100 ml/h -Creatinine trend as below. Recent Labs    10/15/19 0631 12/15/19 1133 12/16/19 1124 12/17/19 0448 12/17/19 0913  CREATININE 1.28* 3.20* 3.60* 3.42* 3.48*   Hypomagnesemia,  hypokalemia -Potassium level low at 2.7 this morning.  IV and oral replacement ordered. -Magnesium level low at 1.6.  IV replacement ordered. -Obtain phosphorus level.  Elevated lipase Intractable nausea vomiting -Lipase level elevated to 278.  No evidence of pancreatitis on CT scan. -Nausea vomiting could also lead to lipase elevation. -Repeat lipase.  Antiemetics for symptomatic management. -Patient wants to start regular diet this morning. Recent Labs  Lab 12/16/19 1124 12/17/19 0913  LIPASE 278* 271*   Right foot wound -On IV Ancef.  X-ray did not show any evidence of bone involvement.  History of Crohn's disease, colon cancer s/p colectomy Ileostomy status -No evidence of obstruction CT abdomen pelvis.  It showed some parasternal hernia and mild ileus likely chronic. -Ostomy nurse eval  H/o PE -Continue Xarelto.  Recent L4-L5 injury with right foot drop -Patient walks with a walker. -On pain control with oxycodone 15 mg every 6 hours at home.  Continue the same.  Alcohol abuse -CIWA protocol -Counseled to quit  Schizoaffective disorder  Cont seroquel, Depakote, Cymbalta.  Mobility: Encourage ambulation.  PT eval ordered Code Status:   Code Status: Full Code  Nutritional status: Body mass index is 33.64 kg/m.     Diet Order            Diet regular Room service appropriate? Yes; Fluid consistency: Thin  Diet effective now               DVT prophylaxis: Antimicrobials:  IV Ancef Fluid: LR at 100 mL/h right now. Consultants: None Family Communication:  None at bedside  Status is: Inpatient Remains inpatient  appropriate because:Persistent severe electrolyte disturbances Dispo: The patient is from: Home               Anticipated d/c is to: Home              Anticipated d/c date is: 3 days              Patient currently is not medically stable to d/c.       Infusions:  .  ceFAZolin (ANCEF) IV Stopped (12/17/19 0720)  . heparin 1,800 Units/hr  (12/17/19 1027)  . lactated ringers 150 mL/hr at 12/16/19 2320    Scheduled Meds: . divalproex  500 mg Oral BID  . DULoxetine  60 mg Oral Daily  . folic acid  1 mg Oral Daily  . multivitamin with minerals  1 tablet Oral Daily  . potassium chloride  40 mEq Oral Once  . QUEtiapine  300 mg Oral QHS  . QUEtiapine  50 mg Oral BID  . thiamine  100 mg Oral Daily   Or  . thiamine  100 mg Intravenous Daily    Antimicrobials: Anti-infectives (From admission, onward)   Start     Dose/Rate Route Frequency Ordered Stop   12/16/19 2230  ceFAZolin (ANCEF) IVPB 2g/100 mL premix        2 g 200 mL/hr over 30 Minutes Intravenous Every 8 hours 12/16/19 2225        PRN meds: acetaminophen **OR** acetaminophen, LORazepam **OR** LORazepam, morphine injection, ondansetron **OR** ondansetron (ZOFRAN) IV, oxyCODONE, simethicone   Objective: Vitals:   12/17/19 1045 12/17/19 1115  BP: 105/69 115/69  Pulse: 93 95  Resp: (!) 23 (!) 21  Temp:    SpO2: 98% 97%    Intake/Output Summary (Last 24 hours) at 12/17/2019 1308 Last data filed at 12/17/2019 1036 Gross per 24 hour  Intake 150 ml  Output 600 ml  Net -450 ml   Filed Weights   12/16/19 2113  Weight: 115.7 kg   Weight change:  Body mass index is 33.64 kg/m.   Physical Exam: General exam: Appears calm and comfortable.  Not in physical distress Skin: No rashes, lesions or ulcers. HEENT: Atraumatic, normocephalic, supple neck, no obvious bleeding Lungs: Clear to auscultation bilaterally CVS: Regular rate and rhythm, no murmur GI/Abd soft, nontender, nondistended bowel sound present, ileostomy bag with brown stool CNS: Alert, awake, oriented x3 Psychiatry: Mood appropriate Extremities: No pedal edema, no calf tenderness.  Right foot with scabbed wound on the dorsum  Data Review: I have personally reviewed the laboratory data and studies available.  Recent Labs  Lab 12/15/19 1133 12/16/19 1124 12/17/19 0448  WBC 20.1* 15.4* 7.8   NEUTROABS 16.6*  --   --   HGB 14.9 14.2 11.7*  HCT 42.4 40.7 33.5*  MCV 89 90.4 90.1  PLT 178 158 PENDING   Recent Labs  Lab 12/15/19 1133 12/16/19 1124 12/16/19 2046 12/17/19 0448 12/17/19 0913  NA 132* 132*  --  133* 133*  K 2.9* 2.6*  --  2.8* 2.7*  CL 87* 91*  --  97* 97*  CO2 22 27  --  21* 24  GLUCOSE 127* 123*  --  100* 96  BUN 36* 34*  --  37* 36*  CREATININE 3.20* 3.60*  --  3.42* 3.48*  CALCIUM 9.9 8.9  --  8.2* 8.1*  MG  --   --  1.5* 1.6*  --   PHOS  --   --   --   --  3.1   Signed, Terrilee Croak, MD [facility] Pager: [phone] (Secure Chat preferred). 12/17/2019

## 2019-12-17 NOTE — Progress Notes (Signed)
NEW ADMISSION NOTE New Admission Note:   Arrival Method:  Pt arrived from ED in stretcher. Mental Orientation: Alert and oriented x 4. Telemetry:  Assessment: Completed Skin: Refused skin check. IV: Left hand and right forearm. Pain: 8/10 back pain medication administered. Tubes: N/A Safety Measures: Safety Fall Prevention Plan has been given, discussed and signed Admission: Completed [facility] Orientation: Patient has been orientated to the room, unit and staff.  Family:  Orders have been reviewed and implemented. Will continue to monitor the patient. Call light has been placed within reach and bed alarm has been activated.   Amaryllis Dyke, RN

## 2019-12-17 NOTE — Progress Notes (Signed)
[place] for heparin Indication: hx PE  Allergies  Allergen Reactions  . Azathioprine Other (See Comments)    Pt does not recall reaction    . Ciprofloxacin Other (See Comments)    Pt does not recall reaction    . Mesalamine Other (See Comments)    Pt does not recall reaction  . Metronidazole Other (See Comments)    Pt does not recall reaction   . Sulfa Antibiotics Other (See Comments)    Pt does not recall reaction   . Barium Sulfate Rash  . Barium-Containing Compounds Rash    Patient Measurements: Height: 6' 1"  (185.4 cm) Weight: 115.7 kg (255 lb) IBW/kg (Calculated) : 79.9 Heparin Dosing Weight: 104.6kg  Vital Signs: BP: 118/90 (09/03 1523) Pulse Rate: 93 (09/03 1523)  Labs: Recent Labs    12/15/19 1133 12/15/19 1133 12/16/19 1124 12/17/19 0448 12/17/19 0700 12/17/19 0913 12/17/19 1545  HGB 14.9  --  14.2 11.7*  --   --   --   HCT 42.4  --  40.7 33.5*  --   --   --   PLT 178  --  158 PENDING  --   --   --   APTT  --   --   --   --  38*  --   --   HEPARINUNFRC  --   --   --   --  0.21*  --  <0.10*  CREATININE 3.20*   < > 3.60* 3.42*  --  3.48*  --    < > = values in this interval not displayed.    Estimated Creatinine Clearance: 38 mL/min (A) (by C-G formula based on SCr of 3.48 mg/dL (H)).   Medical History: Past Medical History:  Diagnosis Date  . Colon cancer ([place])   . Crohn disease ([place])   . PE (pulmonary thromboembolism) ([place])   . Thyroid disease     Medications:  Infusions:  .  ceFAZolin (ANCEF) IV 2 g (12/17/19 1529)  . heparin 1,800 Units/hr (12/17/19 1711)  . lactated ringers 100 mL/hr at 12/17/19 1305    Assessment: 74 yom presented to the ED with an AKI. He is on chronic xarelto due to PE but will switch to IV heparin because of AKI. Baseline CBC is WNL. He has missed several days of his xarelto due to vomiting.   Initial aPTT and heparin level subtherapeutic, anti-Xa level appears to be  correlated consistent with hx of not taking Xarelto in several days, will follow anti-Xa levels from now.    Heparin level came back undetectable this PM. No issues per Rn. We will give him a bolus and increase the rate.   Goal of Therapy:  Heparin level 0.3-0.7 Monitor platelets by anticoagulation protocol: Yes   Plan:  Heparin 2000 units bolus  Increase heparin gtt to 2000 units/hr F/u with AM HL  Onnie Boer, PharmD, BCIDP, AAHIVP, CPP Infectious Disease Pharmacist 12/17/2019 6:39 PM

## 2019-12-17 NOTE — ED Notes (Signed)
Dr. Pietro Cassis notified of critical lab value: Potassium 2.7

## 2019-12-17 NOTE — Consult Note (Signed)
WOC Nurse Consult Note: Patient receiving care in [facility] ED46.  Reason for Consult: ostomy and right foot wounds Wound type: Dried, crusted area to dorsum of right foot close to the great toe, that measures 1.3 cm x 1.8 cm with a narrow band of circumferential erythema.  The patient did not want to tell me how long the area had been there, or how it started. The skin was missing over an area that measures 1 cm x 1.2 cm on the plantar surface of the posterior heel.  The patient stated he "picked it off". No surrounding erythema, no drainage. Pressure Injury POA: Yes/No/NA Measurement: Wound bed: Drainage (amount, consistency, odor)  Periwound: Dressing procedure/placement/frequency: twice daily application of iodine to the dried, scabbed area at the toe. Cleansing with soap and water, and a bandaid for the heel.  [facility] Nurse ostomy follow up Stoma type/location: LUQ colostomy. Patient states he changed his pouch yesterday. There is no washout, no signs of impending leakage. Stomal assessment/size: not measured. The stoma is red, moist, producing brown stool. Peristomal assessment: deferred Treatment options for stomal/peristomal skin: barrier ring if needed. Output  Ostomy pouching: 2pc. , 2 and 3/4 inch flat Pouch, [place] # [phone]; skin barrier Kellie Simmering #2. ED [place] ordering these supplies.   Thank you for the consult.  Discussed plan of care with the patient and bedside nurse [place].  [place] nurse will not follow at this time.  Please re-consult the [place] team if needed.  Val Riles, RN, MSN, CWOCN, CNS-BC, pager [phone]

## 2019-12-17 NOTE — Progress Notes (Signed)
[place] for heparin Indication: hx PE  Allergies  Allergen Reactions  . Azathioprine Other (See Comments)    Pt does not recall reaction    . Ciprofloxacin Other (See Comments)    Pt does not recall reaction    . Mesalamine Other (See Comments)    Pt does not recall reaction  . Metronidazole Other (See Comments)    Pt does not recall reaction   . Sulfa Antibiotics Other (See Comments)    Pt does not recall reaction   . Barium Sulfate Rash  . Barium-Containing Compounds Rash    Patient Measurements: Height: 6' 1"  (185.4 cm) Weight: 115.7 kg (255 lb) IBW/kg (Calculated) : 79.9 Heparin Dosing Weight: 104.6kg  Vital Signs: Temp: 97.7 F (36.5 C) (09/03 0201) Temp Source: Oral (09/03 0201) BP: 106/69 (09/03 0845) Pulse Rate: 94 (09/03 0845)  Labs: Recent Labs    12/15/19 1133 12/16/19 1124 12/17/19 0448 12/17/19 0700  HGB 14.9 14.2  --   --   HCT 42.4 40.7  --   --   PLT 178 158  --   --   HEPARINUNFRC  --   --   --  0.21*  CREATININE 3.20* 3.60* 3.42*  --     Estimated Creatinine Clearance: 38.6 mL/min (A) (by C-G formula based on SCr of 3.42 mg/dL (H)).   Medical History: Past Medical History:  Diagnosis Date  . Colon cancer ([place])   . Crohn disease ([place])   . PE (pulmonary thromboembolism) ([place])   . Thyroid disease     Medications:  Infusions:  .  ceFAZolin (ANCEF) IV Stopped (12/17/19 0720)  . heparin 1,600 Units/hr (12/17/19 0157)  . lactated ringers 150 mL/hr at 12/16/19 2320  . magnesium sulfate bolus IVPB 2 g (12/17/19 0916)    Assessment: 66 yom presented to the ED with an AKI. He is on chronic xarelto due to PE but will switch to IV heparin because of AKI. Baseline CBC is WNL. He has missed several days of his xarelto due to vomiting.   Initial aPTT and heparin level subtherapeutic, anti-Xa level appears to be correlated consistent with hx of not taking Xarelto in several days, will follow anti-Xa  levels from now.    Goal of Therapy:  Heparin level 0.3-0.7 units/ml aPTT 66-102 seconds Monitor platelets by anticoagulation protocol: Yes   Plan:  Increase heparin gtt to 1800 units/hr F/u 6 hour heparin level  Bertis Ruddy, PharmD Clinical Pharmacist ED Pharmacist Phone # [phone] 12/17/2019 9:42 AM

## 2019-12-18 LAB — HEPARIN LEVEL (UNFRACTIONATED)
Heparin Unfractionated: 0.79 IU/mL — ABNORMAL HIGH (ref 0.30–0.70)
Heparin Unfractionated: 0.55 IU/mL (ref 0.30–0.70)
Heparin Unfractionated: 0.49 IU/mL (ref 0.30–0.70)

## 2019-12-18 LAB — BASIC METABOLIC PANEL
Anion gap: 14 (ref 5–15)
BUN: 30 mg/dL — ABNORMAL HIGH (ref 6–20)
CO2: 22 mmol/L (ref 22–32)
Calcium: 8.3 mg/dL — ABNORMAL LOW (ref 8.9–10.3)
Chloride: 99 mmol/L (ref 98–111)
Creatinine, Ser: 3.19 mg/dL — ABNORMAL HIGH (ref 0.61–1.24)
GFR calc Af Amer: 27 mL/min — ABNORMAL LOW (ref >60)
GFR calc non Af Amer: 23 mL/min — ABNORMAL LOW (ref >60)
Glucose, Bld: 85 mg/dL (ref 70–99)
Potassium: 3.7 mmol/L (ref 3.5–5.1)
Sodium: 135 mmol/L (ref 135–145)

## 2019-12-18 LAB — CBC
HCT: 31.3 % — ABNORMAL LOW (ref 39.0–52.0)
Hemoglobin: 10.6 g/dL — ABNORMAL LOW (ref 13.0–17.0)
MCH: 31.7 pg (ref 26.0–34.0)
MCHC: 33.9 g/dL (ref 30.0–36.0)
MCV: 93.7 fL (ref 80.0–100.0)
Platelets: UNDETERMINED 10*3/uL (ref 150–400)
RBC: 3.34 MIL/uL — ABNORMAL LOW (ref 4.22–5.81)
RDW: 13.7 % (ref 11.5–15.5)
WBC: 3.5 10*3/uL — ABNORMAL LOW (ref 4.0–10.5)
nRBC: 0 % (ref 0.0–0.2)

## 2019-12-18 LAB — LIPASE, BLOOD: Lipase: 204 U/L — ABNORMAL HIGH (ref 11–51)

## 2019-12-18 LAB — GLUCOSE, CAPILLARY: Glucose-Capillary: 109 mg/dL — ABNORMAL HIGH (ref 70–99)

## 2019-12-18 LAB — VITAMIN B12: Vitamin B-12: 175 pg/mL — ABNORMAL LOW (ref 180–914)

## 2019-12-18 MED ORDER — LOPERAMIDE HCL 2 MG PO CAPS
2.0000 mg | ORAL_CAPSULE | Freq: Four times a day (QID) | ORAL | Status: DC
  Administered 2019-12-18 – 2019-12-19 (×5): 2 mg via ORAL
  Filled 2019-12-18 (×5): qty 1

## 2019-12-18 MED ORDER — MORPHINE SULFATE (PF) 2 MG/ML IV SOLN
2.0000 mg | INTRAVENOUS | Status: DC | PRN
  Administered 2019-12-18 – 2019-12-19 (×4): 2 mg via INTRAVENOUS
  Filled 2019-12-18 (×5): qty 1

## 2019-12-18 MED ORDER — LORAZEPAM 0.5 MG PO TABS
0.5000 mg | ORAL_TABLET | Freq: Four times a day (QID) | ORAL | Status: DC | PRN
  Administered 2019-12-18 – 2019-12-19 (×2): 0.5 mg via ORAL
  Filled 2019-12-18 (×2): qty 1

## 2019-12-18 MED ORDER — GABAPENTIN 100 MG PO CAPS
100.0000 mg | ORAL_CAPSULE | Freq: Two times a day (BID) | ORAL | Status: DC
  Administered 2019-12-18 – 2019-12-20 (×5): 100 mg via ORAL
  Filled 2019-12-18 (×5): qty 1

## 2019-12-18 NOTE — Evaluation (Signed)
Physical Therapy Evaluation Patient Details Name: Juan Hester MRN: [id number] DOB: May 23, 1980 Today's Date: 12/18/2019   History of Present Illness  Pt is a 39 yo male presenting from PCP due to abnormal lab work concerning for renal disease. Upon work-up, pt found to have AKI, as well as R foot drop. The pt reports he has had significant bilateral LE pain and back pain following fall at work in March. PMH includes: colon cancer with colostomy, bipolar disorder, Crohn's disease, pulmonary embolism, depression/anxiety, and chronic fatigue.    Clinical Impression  Pt in bed upon arrival of PT, agreeable to evaluation at this time. Prior to admission the pt was independent with mobility and ADLs, living in a motel and walking to work that is >1 mile from his house. The has been increasingly limited in mobility due to progressively worsening pain in BLE, and is now presenting with R foot drop. The pt reports he has been unable to walk to work recently due to pain, but has been unable to afford the visit to orthopedist for his back. The pt now presents with limitations in functional mobility, strength in his RLE, stability, and activity tolerance due to chronic LBP from injury at work in March, and will continue to benefit from skilled PT to address these deficits. The pt was able to demo good independence with bed mobility and OOB transfers, but is limited in ambulation distance and stability due to R foot drop and significant pain with AROM/PROM of R ankle during gait. The pt currently ambulates with significant ER of the RLE with poor wt shift to the R and short strides with minimal clearance. The pt will benefit from continued PT acutely to improve capacity for gait with reduced pain and training on use of SPC for improved community ambulation, but will also benefit from OPPT to address LBP and radiating symptoms into his LE.      Follow Up Recommendations Outpatient PT (through workers comp)     Optician, dispensing   (possibly cane)    Recommendations for Other Services       Precautions / Restrictions Precautions Precautions: Fall Restrictions Weight Bearing Restrictions: No      Mobility  Bed Mobility Overal bed mobility: Independent             General bed mobility comments: painful, but no assist needed  Transfers Overall transfer level: Independent Equipment used: None             General transfer comment: pt able to stand without assist, able to steady without UE support  Ambulation/Gait Ambulation/Gait assistance: Min guard Gait Distance (Feet): 15 Feet Assistive device: Rolling walker (2 wheeled);None (trial cane next time) Gait Pattern/deviations: Step-to pattern;Antalgic;Decreased stance time - right;Decreased weight shift to right Gait velocity: decreased Gait velocity interpretation: <1.31 ft/sec, indicative of household ambulator General Gait Details: short steps with significant ER of RLE, pt unable to use ROM at ankle for heel-toe gait pattern without significant pain.         Balance Overall balance assessment: Mild deficits observed, not formally tested                                           Pertinent Vitals/Pain Pain Assessment: Faces Faces Pain Scale: Hurts whole lot Pain Location: R foot with mobility Pain Descriptors / Indicators: Grimacing;Sore;Throbbing Pain Intervention(s): Limited activity within patient's tolerance;Monitored during session;Repositioned  Home Living Family/patient expects to be discharged to:: Other (Comment) (hotel room) Living Arrangements: Alone Available Help at Discharge: Other (Comment) Type of Home:  (hotel room) Home Access: Level entry     Home Layout: One level Home Equipment: None      Prior Function Level of Independence: Independent         Comments: Pt reports normally he is able to ambulate independently, no use of AD. Fell 2.5 weeks ago due to  R foot drop  and had to crawl to mobilize for a few days.     Hand Dominance   Dominant Hand: Right    Extremity/Trunk Assessment   Upper Extremity Assessment Upper Extremity Assessment: Overall WFL for tasks assessed (R shoulder pain and limited function after fall in March)    Lower Extremity Assessment Lower Extremity Assessment: RLE deficits/detail RLE Deficits / Details: small PROM at ankle, unable to AROM due to pain, 3-/5 at knee flex and ext. 4/5 at R hip RLE: Unable to fully assess due to pain RLE Sensation: decreased light touch (decreased sensation below knee. no sensation on dorsum of foot.)    Cervical / Trunk Assessment Cervical / Trunk Assessment:  (LBP)  Communication   Communication: No difficulties  Cognition Arousal/Alertness: Awake/alert Behavior During Therapy: WFL for tasks assessed/performed Overall Cognitive Status: Within Functional Limits for tasks assessed                                        General Comments General comments (skin integrity, edema, etc.): open cut on dorsum of R foot, slight swelling noted    Exercises     Assessment/Plan    PT Assessment Patient needs continued PT services  PT Problem List Decreased strength;Decreased mobility;Decreased range of motion;Decreased activity tolerance;Decreased balance;Pain;Impaired sensation       PT Treatment Interventions DME instruction;Therapeutic exercise;Gait training;Stair training;Functional mobility training;Therapeutic activities;Patient/family education;Balance training    PT Goals (Current goals can be found in the Care Plan section)  Acute Rehab PT Goals Patient Stated Goal: reduce pain so he can walk 1.3 miles to work PT Goal Formulation: With patient Time For Goal Achievement: 01/01/20 Potential to Achieve Goals: Fair    Frequency Min 3X/week    AM-PAC PT "6 Clicks" Mobility  Outcome Measure Help needed turning from your back to your side while in a  flat bed without using bedrails?: None Help needed moving from lying on your back to sitting on the side of a flat bed without using bedrails?: None Help needed moving to and from a bed to a chair (including a wheelchair)?: None Help needed standing up from a chair using your arms (e.g., wheelchair or bedside chair)?: None Help needed to walk in hospital room?: A Little Help needed climbing 3-5 steps with a railing? : A Lot 6 Click Score: 21    End of Session Equipment Utilized During Treatment: Gait belt Activity Tolerance: Patient tolerated treatment well;No increased pain Patient left: in bed;with call bell/phone within reach;with nursing/sitter in room Nurse Communication: Mobility status PT Visit Diagnosis: Unsteadiness on feet (R26.81);Difficulty in walking, not elsewhere classified (R26.2);Muscle weakness (generalized) (M62.81);Pain Pain - Right/Left: Right Pain - part of body: Leg;Ankle and joints of foot    Time: 1962-2297 PT Time Calculation (min) (ACUTE ONLY): 37 min   Charges:   PT Evaluation $PT Eval Moderate Complexity: 1 Mod PT Treatments $Gait Training: 8-22 mins  Karma Ganja, PT, DPT   Acute Rehabilitation Department Pager #: [phone]  Otho Bellows 12/18/2019, 3:59 PM

## 2019-12-18 NOTE — Progress Notes (Signed)
ANTICOAGULATION CONSULT NOTE - Follow Up Consult  Pharmacy Consult for heparin Indication: history of PE  Allergies  Allergen Reactions  . Azathioprine Other (See Comments)    Pt does not recall reaction    . Ciprofloxacin Other (See Comments)    Pt does not recall reaction    . Mesalamine Other (See Comments)    Pt does not recall reaction  . Metronidazole Other (See Comments)    Pt does not recall reaction   . Sulfa Antibiotics Other (See Comments)    Pt does not recall reaction   . Barium Sulfate Rash  . Barium-Containing Compounds Rash    Patient Measurements: Height: 6' 1"  (185.4 cm) Weight: 118.1 kg (260 lb 5.8 oz) IBW/kg (Calculated) : 79.9 Heparin Dosing Weight: 104.6  Vital Signs: Temp: 98.3 F (36.8 C) (09/04 0856) Temp Source: Oral (09/04 0856) BP: 109/70 (09/04 0856) Pulse Rate: 98 (09/04 0856)  Labs: Recent Labs    12/16/19 1124 12/16/19 1124 12/17/19 0448 12/17/19 0448 12/17/19 0700 12/17/19 0700 12/17/19 0913 12/17/19 1545 12/17/19 1903 12/18/19 0419 12/18/19 1310  HGB 14.2   < > 11.7*  --   --   --   --   --   --  10.6*  --   HCT 40.7  --  33.5*  --   --   --   --   --   --  31.3*  --   PLT 158  --  100*  --   --   --   --   --   --  PLATELET CLUMPS NOTED ON SMEAR, UNABLE TO ESTIMATE  --   APTT  --   --   --   --  38*  --   --   --   --   --   --   HEPARINUNFRC  --   --   --   --  0.21*   < >  --  <0.10*  --  0.79* 0.55  CREATININE 3.60*   < > 3.42*   < >  --   --  3.48*  --  3.65* 3.19*  --    < > = values in this interval not displayed.    Estimated Creatinine Clearance: 41.9 mL/min (A) (by C-G formula based on SCr of 3.19 mg/dL (H)).   Medications:  Scheduled:  . divalproex  500 mg Oral BID  . DULoxetine  60 mg Oral Daily  . folic acid  1 mg Oral Daily  . gabapentin  100 mg Oral BID  . loperamide  2 mg Oral QID  . multivitamin with minerals  1 tablet Oral Daily  . QUEtiapine  300 mg Oral QHS  . QUEtiapine  50 mg Oral BID  .  thiamine  100 mg Oral Daily   Or  . thiamine  100 mg Intravenous Daily    Assessment: 39 YO male presenting to the ED with AKI.  Patient is on chronic Xarelto due to history of PE but will be switched to IV heparin due to AKI.  He has missed several days of his Xarelto due to vomiting.    6 hour HL therapeutic at 0.55 with current drip rate at 1900 units/hr.  CBC stable. Slight decrease in Scr today but still elevated (3.65 > 3.19).  Continue heparin for now and will restart Xarelto once Scr near baseline (1.2)  Goal of Therapy:  Heparin level 0.3-0.7 units/ml Monitor platelets by anticoagulation protocol: Yes   Plan:  -  Continue heparin drip at 1900 units/hr -Check 6 hour HL -Check daily Scr and f/u Xarelto restart -Check daily CBC  Dimple Nanas, PharmD PGY-1 Acute Care Pharmacy Resident Office: [phone] 12/18/2019 2:52 PM

## 2019-12-18 NOTE — Progress Notes (Signed)
[place] for heparin Indication: hx PE  Assessment: 12 yom presented to the ED with an AKI. He is on chronic xarelto due to PE but will switch to IV heparin because of AKI. Baseline CBC is WNL. He has missed several days of his xarelto due to vomiting.   Heparin level 0.79 units/ml  Goal of Therapy:  Heparin level 0.3-0.7 Monitor platelets by anticoagulation protocol: Yes   Plan:   Decrease heparin gtt to 1900 units/hr Check heparin level in ~ 6 hours  Thanks for allowing pharmacy to be a part of this patient's care.  Excell Seltzer, PharmD Clinical Pharmacist 12/18/2019 6:09 AM

## 2019-12-18 NOTE — Progress Notes (Signed)
PROGRESS NOTE  Juan Hester  DOB: January 23, 1981  PCP: [name], MD BHA:[id number]  DOA: 12/16/2019  LOS: 2 days   Chief Complaint  Patient presents with  . 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 Patient was sent to the ED on 12/16/2019 from PCPs office for abnormal labs. 10 days prior to presentation, patient started 'fasting for spiritual reasons' where he did not eat anything but drank plenty of fluids.  In about a week, he started having bilious vomiting.  He gave up fasting but could not tolerate any food. 9/1, pt went to PCPs office for right foot wound.  Routine labs showed elevated creatinine and hence patient was sent to the ED. Also reports chronic freq stools >10times/day at baseline, supposed to be on Imodium but has not taken this in the past few weeks. In the ED,  hemodynamically stable Labs noted  creatinine elevated to 3.42, baseline 1.2, lipase elevated to 278, LFT normal. CT scan of abdomen pelvis did not show any acute abnormality.  Subjective: -Feels  okay this morning, denies any nausea or vomiting, complains of chronic back and leg pain  Assessment/Plan:  Acute kidney injury  -Due to dehydration related to poor oral intake and chronic GI losses, has high output from his colostomy -Also admits to chronic NSAID/daily naproxen use -Continue IV fluids today, add Imodium 4 times daily -Monitor urine output, BMP in a.m.  H/o PE -Continue heparin at this time, Xarelto on hold in the setting of AKI, when kidney function improves resume this  Hypomagnesemia, hypokalemia -Replaced  Intractable nausea vomiting -Resolved, -CT abdomen pelvis was unremarkable  Right foot scab -Local wound care, monitor  History of Crohn's disease, colon cancer s/p colectomy -No  evidence of obstruction CT abdomen pelvis.  It showed some parasternal hernia and mild ileus likely chronic. -Ostomy nurse consulted -Follow-up with gastroenterology  Recent L4-L5 injury with right foot drop -Patient walks with a walker. -On pain control with oxycodone 15 mg every 6 hours at home.  Continue the same.  Alcohol use -Patient denies daily alcohol use, no withdrawal, DC Ativan per CIWA protocol  Schizoaffective disorder  Cont seroquel, Depakote, Cymbalta.  Mobility: Encourage ambulation.  PT eval ordered Code Status:   Code Status: Full Code  Nutritional status: Body mass index is 34.35 kg/m.     Diet Order            Diet regular Room service appropriate? Yes; Fluid consistency: Thin  Diet effective now               DVT prophylaxis: IV heparin Consultants: None Family Communication:  None at bedside  Status is: Inpatient Remains inpatient appropriate because: AKI Dispo: The patient is from: Home               Anticipated d/c is to: Home              Anticipated d/c date is: likely 2days              Patient currently is not medically stable to d/c.   Infusions:  . heparin 1,900 Units/hr (12/18/19 0609)  . lactated ringers 100 mL/hr at 12/18/19 0015    Scheduled Meds: . divalproex  500 mg Oral BID  . DULoxetine  60 mg Oral Daily  . folic acid  1 mg Oral Daily  . gabapentin  100 mg Oral BID  . loperamide  2 mg Oral QID  . multivitamin with minerals  1 tablet Oral Daily  . QUEtiapine  300 mg Oral QHS  . QUEtiapine  50 mg Oral BID  . thiamine  100 mg Oral Daily   Or  . thiamine  100 mg Intravenous Daily    Antimicrobials: Anti-infectives (From admission, onward)   Start     Dose/Rate Route Frequency Ordered Stop   12/16/19 2230  ceFAZolin (ANCEF) IVPB 2g/100 mL premix  Status:  Discontinued        2 g 200 mL/hr over 30 Minutes Intravenous Every 8 hours 12/16/19 2225 12/18/19 1014      PRN meds: acetaminophen **OR** acetaminophen,  LORazepam, morphine injection, ondansetron **OR** ondansetron (ZOFRAN) IV, oxyCODONE, simethicone   Objective: Vitals:   12/18/19 0402 12/18/19 0856  BP: 116/81 109/70  Pulse: 95 98  Resp: 18 18  Temp: 98.4 F (36.9 C) 98.3 F (36.8 C)  SpO2: 100% 99%    Intake/Output Summary (Last 24 hours) at 12/18/2019 1350 Last data filed at 12/18/2019 0935 Gross per 24 hour  Intake 1619 ml  Output --  Net 1619 ml   Filed Weights   12/16/19 2113 12/18/19 0402  Weight: 115.7 kg 118.1 kg   Weight change: 2.433 kg Body mass index is 34.35 kg/m.   Physical Exam: General exam: Pleasant young male sitting up in bed, AAOx3, no distress HEENT: Neck supple no JVD CVS: S1-S2, regular rate rhythm Lungs: Clear bilaterally Abdomen: Soft, nontender, colostomy bag with yellow/brown stool Extremity: No edema, scabbed wound on the dorsum of his right foot, foot drop on the right  Psychiatry: Mood appropriate  Data Review: I have personally reviewed the laboratory data and studies available.  Recent Labs  Lab 12/15/19 1133 12/16/19 1124 12/17/19 0448 12/18/19 0419  WBC 20.1* 15.4* 7.8 3.5*  NEUTROABS 16.6*  --   --   --   HGB 14.9 14.2 11.7* 10.6*  HCT 42.4 40.7 33.5* 31.3*  MCV 89 90.4 90.1 93.7  PLT 178 158 100* PLATELET CLUMPS NOTED ON SMEAR, UNABLE TO ESTIMATE   Recent Labs  Lab 12/16/19 1124 12/16/19 2046 12/17/19 0448 12/17/19 0913 12/17/19 1903 12/18/19 0419  NA 132*  --  133* 133* 135 135  K 2.6*  --  2.8* 2.7* 3.3* 3.7  CL 91*  --  97* 97* 99 99  CO2 27  --  21* 24 24 22   GLUCOSE 123*  --  100* 96 128* 85  BUN 34*  --  37* 36* 34* 30*  CREATININE 3.60*  --  3.42* 3.48* 3.65* 3.19*  CALCIUM 8.9  --  8.2* 8.1* 8.3* 8.3*  MG  --  1.5* 1.6*  --   --   --   PHOS  --   --   --  3.1  --   --    Signed, Domenic Polite, MD [facility]  12/18/2019

## 2019-12-18 NOTE — Progress Notes (Signed)
ANTICOAGULATION CONSULT NOTE - Follow Up Consult  Pharmacy Consult for heparin Indication: history of PE  Allergies  Allergen Reactions  . Azathioprine Other (See Comments)    Pt does not recall reaction    . Ciprofloxacin Other (See Comments)    Pt does not recall reaction    . Mesalamine Other (See Comments)    Pt does not recall reaction  . Metronidazole Other (See Comments)    Pt does not recall reaction   . Sulfa Antibiotics Other (See Comments)    Pt does not recall reaction   . Barium Sulfate Rash  . Barium-Containing Compounds Rash    Patient Measurements: Height: 6' 1"  (185.4 cm) Weight: 118.1 kg (260 lb 5.8 oz) IBW/kg (Calculated) : 79.9 Heparin Dosing Weight: 104.6  Vital Signs: Temp: 98 F (36.7 C) (09/04 2208) Temp Source: Oral (09/04 1652) BP: 110/76 (09/04 2208) Pulse Rate: 88 (09/04 2208)  Labs: Recent Labs    12/16/19 1124 12/16/19 1124 12/17/19 0448 12/17/19 0448 12/17/19 0700 12/17/19 0913 12/17/19 1545 12/17/19 1903 12/18/19 0419 12/18/19 1310 12/18/19 2156  HGB 14.2   < > 11.7*  --   --   --   --   --  10.6*  --   --   HCT 40.7  --  33.5*  --   --   --   --   --  31.3*  --   --   PLT 158  --  100*  --   --   --   --   --  PLATELET CLUMPS NOTED ON SMEAR, UNABLE TO ESTIMATE  --   --   APTT  --   --   --   --  38*  --   --   --   --   --   --   HEPARINUNFRC  --   --   --   --  0.21*  --    < >  --  0.79* 0.55 0.49  CREATININE 3.60*   < > 3.42*   < >  --  3.48*  --  3.65* 3.19*  --   --    < > = values in this interval not displayed.    Estimated Creatinine Clearance: 41.9 mL/min (A) (by C-G formula based on SCr of 3.19 mg/dL (H)).   Medications:  Scheduled:  . divalproex  500 mg Oral BID  . DULoxetine  60 mg Oral Daily  . folic acid  1 mg Oral Daily  . gabapentin  100 mg Oral BID  . loperamide  2 mg Oral QID  . multivitamin with minerals  1 tablet Oral Daily  . QUEtiapine  300 mg Oral QHS  . QUEtiapine  50 mg Oral BID  .  thiamine  100 mg Oral Daily   Or  . thiamine  100 mg Intravenous Daily    Assessment: 39 YO male presenting to the ED with AKI.  Patient is on chronic Xarelto due to history of PE but will be switched to IV heparin due to AKI.  He has missed several days of his Xarelto due to vomiting.   -heparin level= 0.49   Goal of Therapy:  Heparin level 0.3-0.7 units/ml Monitor platelets by anticoagulation protocol: Yes   Plan:  -Continue heparin drip at 1900 units/hr -Check daily Scr and f/u Xarelto restart -Check daily CBC  Hildred Laser, PharmD Clinical Pharmacist **Pharmacist phone directory can now be found on [email or web address] ([email or web address]).  Listed under [place].

## 2019-12-19 LAB — RETICULOCYTES
Immature Retic Fract: 1.6 % — ABNORMAL LOW (ref 2.3–15.9)
RBC.: 3.2 MIL/uL — ABNORMAL LOW (ref 4.22–5.81)
Retic Count, Absolute: 49.3 10*3/uL (ref 19.0–186.0)
Retic Ct Pct: 1.5 % (ref 0.4–3.1)

## 2019-12-19 LAB — BASIC METABOLIC PANEL
Anion gap: 10 (ref 5–15)
BUN: 20 mg/dL (ref 6–20)
CO2: 27 mmol/L (ref 22–32)
Calcium: 8.4 mg/dL — ABNORMAL LOW (ref 8.9–10.3)
Chloride: 100 mmol/L (ref 98–111)
Creatinine, Ser: 2.46 mg/dL — ABNORMAL HIGH (ref 0.61–1.24)
GFR calc Af Amer: 37 mL/min — ABNORMAL LOW (ref >60)
GFR calc non Af Amer: 32 mL/min — ABNORMAL LOW (ref >60)
Glucose, Bld: 107 mg/dL — ABNORMAL HIGH (ref 70–99)
Potassium: 3.5 mmol/L (ref 3.5–5.1)
Sodium: 137 mmol/L (ref 135–145)

## 2019-12-19 LAB — CBC
HCT: 30.2 % — ABNORMAL LOW (ref 39.0–52.0)
Hemoglobin: 10 g/dL — ABNORMAL LOW (ref 13.0–17.0)
MCH: 31.4 pg (ref 26.0–34.0)
MCHC: 33.1 g/dL (ref 30.0–36.0)
MCV: 95 fL (ref 80.0–100.0)
Platelets: 80 10*3/uL — ABNORMAL LOW (ref 150–400)
RBC: 3.18 MIL/uL — ABNORMAL LOW (ref 4.22–5.81)
RDW: 13.4 % (ref 11.5–15.5)
WBC: 2.8 10*3/uL — ABNORMAL LOW (ref 4.0–10.5)
nRBC: 0 % (ref 0.0–0.2)

## 2019-12-19 LAB — FOLATE: Folate: 6.5 ng/mL (ref >5.9)

## 2019-12-19 LAB — IRON AND TIBC
Iron: 75 ug/dL (ref 45–182)
Saturation Ratios: 23 % (ref 17.9–39.5)
TIBC: 322 ug/dL (ref 250–450)
UIBC: 247 ug/dL

## 2019-12-19 LAB — HEPARIN LEVEL (UNFRACTIONATED): Heparin Unfractionated: 0.47 IU/mL (ref 0.30–0.70)

## 2019-12-19 LAB — FERRITIN: Ferritin: 297 ng/mL (ref 24–336)

## 2019-12-19 LAB — VITAMIN B12: Vitamin B-12: 154 pg/mL — ABNORMAL LOW (ref 180–914)

## 2019-12-19 MED ORDER — MORPHINE SULFATE (PF) 2 MG/ML IV SOLN
1.0000 mg | INTRAVENOUS | Status: DC | PRN
  Administered 2019-12-19 (×2): 1 mg via INTRAVENOUS
  Filled 2019-12-19 (×2): qty 1

## 2019-12-19 MED ORDER — DIPHENOXYLATE-ATROPINE 2.5-0.025 MG PO TABS
1.0000 | ORAL_TABLET | Freq: Four times a day (QID) | ORAL | Status: DC
  Administered 2019-12-19 – 2019-12-20 (×4): 1 via ORAL
  Filled 2019-12-19 (×4): qty 1

## 2019-12-19 MED ORDER — LACTATED RINGERS IV SOLN: INTRAVENOUS | Status: AC

## 2019-12-19 MED ORDER — PANTOPRAZOLE SODIUM 40 MG PO TBEC
40.0000 mg | DELAYED_RELEASE_TABLET | Freq: Every day | ORAL | Status: DC
  Administered 2019-12-19 – 2019-12-20 (×2): 40 mg via ORAL
  Filled 2019-12-19 (×2): qty 1

## 2019-12-19 NOTE — Progress Notes (Signed)
ANTICOAGULATION CONSULT NOTE - Follow Up Consult  Pharmacy Consult for heparin Indication: history of PE  Allergies  Allergen Reactions  . Azathioprine Other (See Comments)    Pt does not recall reaction    . Ciprofloxacin Other (See Comments)    Pt does not recall reaction    . Mesalamine Other (See Comments)    Pt does not recall reaction  . Metronidazole Other (See Comments)    Pt does not recall reaction   . Sulfa Antibiotics Other (See Comments)    Pt does not recall reaction   . Barium Sulfate Rash  . Barium-Containing Compounds Rash    Patient Measurements: Height: 6' 1"  (185.4 cm) Weight: 118.1 kg (260 lb 5.8 oz) IBW/kg (Calculated) : 79.9 Heparin Dosing Weight: 104.6  Vital Signs: Temp: 98.2 F (36.8 C) (09/05 0508) Temp Source: Oral (09/05 0508) BP: 113/80 (09/05 0508) Pulse Rate: 88 (09/05 0600)  Labs: Recent Labs     0000 12/17/19 0448 12/17/19 0700 12/17/19 0913 12/17/19 1545 12/17/19 1903 12/18/19 0419 12/18/19 0419 12/18/19 1310 12/18/19 2156 12/19/19 0310  HGB   < > 11.7*  --   --   --   --  10.6*  --   --   --  10.0*  HCT  --  33.5*  --   --   --   --  31.3*  --   --   --  30.2*  PLT  --  100*  --   --   --   --  PLATELET CLUMPS NOTED ON SMEAR, UNABLE TO ESTIMATE  --   --   --  80*  APTT  --   --  38*  --   --   --   --   --   --   --   --   HEPARINUNFRC  --   --  0.21*   < >   < >  --  0.79*   < > 0.55 0.49 0.47  CREATININE  --  3.42*  --    < >  --  3.65* 3.19*  --   --   --  2.46*   < > = values in this interval not displayed.    Estimated Creatinine Clearance: 54.3 mL/min (A) (by C-G formula based on SCr of 2.46 mg/dL (H)).   Medications:  Scheduled:  . divalproex  500 mg Oral BID  . DULoxetine  60 mg Oral Daily  . folic acid  1 mg Oral Daily  . gabapentin  100 mg Oral BID  . loperamide  2 mg Oral QID  . multivitamin with minerals  1 tablet Oral Daily  . QUEtiapine  300 mg Oral QHS  . QUEtiapine  50 mg Oral BID  . thiamine   100 mg Oral Daily   Or  . thiamine  100 mg Intravenous Daily    Assessment: 39 YO male presenting to the ED with AKI.  Patient is on chronic Xarelto due to history of PE but will be switched to IV heparin due to AKI.  He missed several days of his Xarelto due to vomiting.  Current plan is to restart Xarelto once AKI resolves.  9/5 daily heparin level therapeutic at 0.47.  Continue current drip rate.  CBC stable.    Goal of Therapy:  Heparin level 0.3-0.7 units/ml Monitor platelets by anticoagulation protocol: Yes   Plan:  -Continue heparin drip at 1900 units/hr -Check daily Scr and f/u Xarelto restart -Check daily CBC  Dimple Nanas, PharmD PGY-1 Acute Care Pharmacy Resident Office: [phone] 12/19/2019 7:37 AM

## 2019-12-19 NOTE — Progress Notes (Signed)
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 Patient was sent to the ED on 12/16/2019 from PCPs office for abnormal labs. 10 days prior to presentation, patient started 'fasting for spiritual reasons' where he did not eat anything but drank plenty of fluids.  In about a week, he started having bilious vomiting.  He gave up fasting but could not tolerate any food. 9/1, pt went to PCPs office for right foot wound.  Routine labs showed elevated creatinine and hence patient was sent to the ED. Also reports chronic freq stools/ostomy output >10times/day at baseline, supposed to be on lomotil but has not taken this in the past few weeks. In the ED,  hemodynamically stable Labs noted  creatinine elevated to 3.42, baseline 1.2, lipase elevated to 278, LFT normal. CT scan of abdomen pelvis did not show any acute abnormality.  Subjective: -Feels better today, started taking Imodium -Complains of chronic leg and back pain  Assessment/Plan:  Acute kidney injury  -Due to dehydration related to poor oral intake and chronic GI losses, has high output from his colostomy -Also admits to chronic NSAID/daily naproxen use -Continue IV fluids today, added Lomotil 4 times daily  -Urine output improving, BMP in a.m.   H/o PE -Continue heparin at this time, Xarelto on hold in the setting of AKI, when kidney function improves resume this  Hypomagnesemia, hypokalemia -Replaced  Intractable nausea vomiting -Resolved, -CT abdomen pelvis was unremarkable  Right foot scab -Local wound care, monitor  History of Crohn's disease, colon cancer s/p  colectomy -No evidence of obstruction CT abdomen pelvis.  It showed some parasternal hernia and mild ileus likely chronic. -Ostomy nurse consulted -Follow-up with gastroenterology  Recent L4-L5 injury with right foot drop -Patient walks with a walker. -On pain control with oxycodone 15 mg every 6 hours at home.  Continue the same.  Alcohol use -Patient denies daily alcohol use, no withdrawal, DC Ativan per CIWA protocol  Schizoaffective disorder  Cont seroquel, Depakote, Cymbalta.  Code Status:   Code Status: Full Code  Family Communication:  None at bedside  Status is: Inpatient Remains inpatient appropriate because: AKI Dispo: The patient is from: Home               Anticipated d/c is to: Home              Anticipated d/c date is: likely 1-2days              Patient currently is not medically stable to d/c.   Infusions:   heparin 1,900 Units/hr (12/19/19 1003)   lactated ringers 75 mL/hr at 12/19/19 1000    Scheduled Meds:  diphenoxylate-atropine  1 tablet Oral QID   divalproex  500 mg Oral BID   DULoxetine  60 mg Oral Daily   folic acid  1 mg Oral Daily   gabapentin  100 mg Oral BID   multivitamin with minerals  1 tablet Oral Daily   pantoprazole  40 mg Oral Q1200   QUEtiapine  300 mg Oral QHS   QUEtiapine  50 mg Oral BID   thiamine  100 mg Oral Daily   Or  thiamine  100 mg Intravenous Daily    Antimicrobials: Anti-infectives (From admission, onward)   Start     Dose/Rate Route Frequency Ordered Stop   12/16/19 2230  ceFAZolin (ANCEF) IVPB 2g/100 mL premix  Status:  Discontinued        2 g 200 mL/hr over 30 Minutes Intravenous Every 8 hours 12/16/19 2225 12/18/19 1014      PRN meds: acetaminophen **OR** acetaminophen, LORazepam, morphine injection, ondansetron **OR** ondansetron (ZOFRAN) IV, oxyCODONE, simethicone   Objective: Vitals:   12/19/19 0508 12/19/19 0600  BP: 113/80   Pulse: (!) 106 88  Resp: 16   Temp: 98.2 F (36.8 C)    SpO2: 97%     Intake/Output Summary (Last 24 hours) at 12/19/2019 1408 Last data filed at 12/19/2019 0803 Gross per 24 hour  Intake 3761.15 ml  Output 100 ml  Net 3661.15 ml   Filed Weights   12/16/19 2113 12/18/19 0402  Weight: 115.7 kg 118.1 kg   Weight change:  Body mass index is 34.35 kg/m.   Physical Exam: General exam: Pleasant young male sitting up in bed, AAOx3, no distress HEENT: Neck supple no JVD CVS: S1-S2, regular rate rhythm Lungs: Diminished breath sounds the bases, otherwise clear Abdomen: Soft, nontender, colostomy bag with yellow-brown stool Extremities: No edema, scabbed wound nickel sized on the dorsum of his foot, right foot drop noted  Psychiatry: Mood appropriate  Data Review: I have personally reviewed the laboratory data and studies available.  Recent Labs  Lab 12/15/19 1133 12/16/19 1124 12/17/19 0448 12/18/19 0419 12/19/19 0310  WBC 20.1* 15.4* 7.8 3.5* 2.8*  NEUTROABS 16.6*  --   --   --   --   HGB 14.9 14.2 11.7* 10.6* 10.0*  HCT 42.4 40.7 33.5* 31.3* 30.2*  MCV 89 90.4 90.1 93.7 95.0  PLT 178 158 100* PLATELET CLUMPS NOTED ON SMEAR, UNABLE TO ESTIMATE 80*   Recent Labs  Lab 12/16/19 1124 12/16/19 2046 12/17/19 0448 12/17/19 0913 12/17/19 1903 12/18/19 0419 12/19/19 0310  NA   < >  --  133* 133* 135 135 137  K   < >  --  2.8* 2.7* 3.3* 3.7 3.5  CL   < >  --  97* 97* 99 99 100  CO2   < >  --  21* 24 24 22 27   GLUCOSE   < >  --  100* 96 128* 85 107*  BUN   < >  --  37* 36* 34* 30* 20  CREATININE   < >  --  3.42* 3.48* 3.65* 3.19* 2.46*  CALCIUM   < >  --  8.2* 8.1* 8.3* 8.3* 8.4*  MG  --  1.5* 1.6*  --   --   --   --   PHOS  --   --   --  3.1  --   --   --    < > = values in this interval not displayed.   Signed, Domenic Polite, MD [facility]  12/19/2019

## 2019-12-19 NOTE — Progress Notes (Signed)
Patient's ostomy had some leaky this morning, however, couldn't change dressing because patient did not have supplies at bedside. Gauze dressing placed on site to contain the leakage. Will continue to monitor.    Tsion Inghram,RN.

## 2019-12-19 NOTE — Plan of Care (Signed)
  Problem: Education: Goal: Knowledge of disease and its progression will improve Outcome: Progressing   Problem: Pain Managment: Goal: General experience of comfort will improve Outcome: Progressing

## 2019-12-19 NOTE — Progress Notes (Signed)
Physical Therapy Treatment Patient Details Name: Juan Hester MRN: [id number] DOB: Jan 14, 1981 Today's Date: 12/19/2019    History of Present Illness Pt is a 39 yo male presenting from PCP due to abnormal lab work concerning for renal disease. Upon work-up, pt found to have AKI, as well as R foot drop. The pt reports he has had significant bilateral LE pain and back pain following fall at work in March. PMH includes: colon cancer with colostomy, bipolar disorder, Crohn's disease, pulmonary embolism, depression/anxiety, and chronic fatigue.    PT Comments    Pt seen for gait training with straight cane. Pt much improved ambulation tolerance as cane in L hand off-weights R LE minimizing pain with ambulation. Pt doesn't have stairs at his motel room but discussed sequencing encase he comes across them. Acute PT to cont to follow.    Follow Up Recommendations  Outpatient PT (through workes comp)     Equipment Recommendations  Other (comment) (straight cane)    Recommendations for Other Services       Precautions / Restrictions Precautions Precautions: Fall Restrictions Weight Bearing Restrictions: No    Mobility  Bed Mobility Overal bed mobility: Independent                Transfers Overall transfer level: Independent Equipment used: None             General transfer comment: pt transfering/amb to/from bathroom with IV pole  Ambulation/Gait Ambulation/Gait assistance: Supervision Gait Distance (Feet): 100 Feet Assistive device: Straight cane Gait Pattern/deviations: Step-through pattern;Decreased stride length Gait velocity: decreased Gait velocity interpretation: 1.31 - 2.62 ft/sec, indicative of limited community ambulator General Gait Details: completed gait training with sequencing straight cane in L hand to off weight R foot. pt much improved by end of ambulation and achieve safe, fluid gait pattern, pt reports "this helps alot"   Stairs Stairs: Yes        General stair comments: discussed and demo'd stairs in room, sequencing up with good/down with the bad and to keep cane sequenced with R foot at all times   Wheelchair Mobility    Modified Rankin (Stroke Patients Only)       Balance Overall balance assessment: Mild deficits observed, not formally tested                                          Cognition Arousal/Alertness: Awake/alert Behavior During Therapy: WFL for tasks assessed/performed Overall Cognitive Status: Within Functional Limits for tasks assessed                                        Exercises Other Exercises Other Exercises: instructed to complete AROM to R ankle to assist with strengthening and minimizing drop foot    General Comments General comments (skin integrity, edema, etc.): large R scab on top of R foot      Pertinent Vitals/Pain Pain Assessment: Faces Faces Pain Scale: Hurts even more Pain Location: R foot, increased with mobiltiy Pain Descriptors / Indicators: Grimacing;Sore;Throbbing Pain Intervention(s): Limited activity within patient's tolerance    Home Living                      Prior Function            PT Goals (current goals  can now be found in the care plan section) Progress towards PT goals: Progressing toward goals    Frequency    Min 3X/week      PT Plan Current plan remains appropriate    Co-evaluation              AM-PAC PT "6 Clicks" Mobility   Outcome Measure  Help needed turning from your back to your side while in a flat bed without using bedrails?: None Help needed moving from lying on your back to sitting on the side of a flat bed without using bedrails?: None Help needed moving to and from a bed to a chair (including a wheelchair)?: None Help needed standing up from a chair using your arms (e.g., wheelchair or bedside chair)?: None Help needed to walk in hospital room?: A Little Help needed climbing  3-5 steps with a railing? : A Little 6 Click Score: 22    End of Session Equipment Utilized During Treatment: Gait belt Activity Tolerance: Patient tolerated treatment well;No increased pain Patient left: in bed;with call bell/phone within reach Nurse Communication: Mobility status PT Visit Diagnosis: Unsteadiness on feet (R26.81);Difficulty in walking, not elsewhere classified (R26.2);Muscle weakness (generalized) (M62.81);Pain Pain - Right/Left: Right Pain - part of body: Leg;Ankle and joints of foot     Time: 1310-1335 PT Time Calculation (min) (ACUTE ONLY): 25 min  Charges:  $Gait Training: 23-37 mins                     Juan Hester, PT, DPT Acute Rehabilitation Services Pager #: [phone] Office #: [phone]    Juan Hester 12/19/2019, 2:10 PM

## 2019-12-20 LAB — CBC
HCT: 29.7 % — ABNORMAL LOW (ref 39.0–52.0)
Hemoglobin: 9.7 g/dL — ABNORMAL LOW (ref 13.0–17.0)
MCH: 31.1 pg (ref 26.0–34.0)
MCHC: 32.7 g/dL (ref 30.0–36.0)
MCV: 95.2 fL (ref 80.0–100.0)
Platelets: 80 10*3/uL — ABNORMAL LOW (ref 150–400)
RBC: 3.12 MIL/uL — ABNORMAL LOW (ref 4.22–5.81)
RDW: 13.5 % (ref 11.5–15.5)
WBC: 2.6 10*3/uL — ABNORMAL LOW (ref 4.0–10.5)
nRBC: 0 % (ref 0.0–0.2)

## 2019-12-20 LAB — BASIC METABOLIC PANEL
Anion gap: 11 (ref 5–15)
BUN: 14 mg/dL (ref 6–20)
CO2: 23 mmol/L (ref 22–32)
Calcium: 8.2 mg/dL — ABNORMAL LOW (ref 8.9–10.3)
Chloride: 101 mmol/L (ref 98–111)
Creatinine, Ser: 1.93 mg/dL — ABNORMAL HIGH (ref 0.61–1.24)
GFR calc Af Amer: 49 mL/min — ABNORMAL LOW (ref >60)
GFR calc non Af Amer: 43 mL/min — ABNORMAL LOW (ref >60)
Glucose, Bld: 116 mg/dL — ABNORMAL HIGH (ref 70–99)
Potassium: 3.6 mmol/L (ref 3.5–5.1)
Sodium: 135 mmol/L (ref 135–145)

## 2019-12-20 LAB — HEPARIN LEVEL (UNFRACTIONATED): Heparin Unfractionated: 0.21 IU/mL — ABNORMAL LOW (ref 0.30–0.70)

## 2019-12-20 MED ORDER — DIPHENOXYLATE-ATROPINE 2.5-0.025 MG PO TABS
1.0000 | ORAL_TABLET | Freq: Four times a day (QID) | ORAL | 0 refills | Status: DC
Start: 2019-12-20 — End: 2019-12-27

## 2019-12-20 MED ORDER — RIVAROXABAN 10 MG PO TABS
20.0000 mg | ORAL_TABLET | Freq: Every day | ORAL | Status: DC
  Administered 2019-12-20: 20 mg via ORAL
  Filled 2019-12-20: qty 2

## 2019-12-20 MED ORDER — ACETAMINOPHEN 325 MG PO TABS: 650.0000 mg | ORAL_TABLET | Freq: Four times a day (QID) | ORAL | Status: DC | PRN

## 2019-12-20 NOTE — Progress Notes (Signed)
Juan Hester to be discharged Home per MD order. Discussed prescriptions and follow up appointments with the patient. Prescriptions and medication list explained in detail. Patient verbalized understanding.  Skin clean, dry and intact without evidence of skin break down, no evidence of skin tears noted. IV catheter discontinued intact. Site without signs and symptoms of complications. Dressing and pressure applied. Pt denies pain at the site currently. No complaints noted.   Patient free of lines, drains, and wounds.   Cane and cab voucher given to the patient.  An After Visit Summary (AVS) was printed and given to the patient. Patient escorted via wheelchair, and discharged home via private auto.  Amaryllis Dyke, RN

## 2019-12-20 NOTE — Discharge Summary (Signed)
Physician Discharge Summary  Leonid Manus ZOX:[id number] DOB: January 21, 1981 DOA: 12/16/2019  PCP: [name], MD  Admit date: 12/16/2019 Discharge date: 12/20/2019  Time spent: 35 minutes  Recommendations for Outpatient Follow-up:  1. PCP Dr. Joya Gaskins in 1 week, consider weaning narcotics, please check BMP in 1 week, emphasized compliance with Lomotil    Discharge Diagnoses:  Principal Problem:   Acute renal failure ([place]) High colostomy output   Crohn's disease with complication ([place])   History of colon cancer   ETOH abuse   Schizoaffective disorder, bipolar type ([place])   Abscess or cellulitis of foot   Colostomy status ([place])   History of pulmonary embolism   Nausea and vomiting   Discharge Condition: Stable  Diet recommendation: Regular  Filed Weights   12/16/19 2113 12/18/19 0402  Weight: 115.7 kg 118.1 kg    History of present illness:  Niguel Ritteris a 39/M wCrohn's disease, colon cancer s/p L hemicolectomy and ostomy, prior multifocal PEs on Xarelto, schizoaffective disorder, alcohol use recent L4-5 injury with right foot drop. Patient was sent to the ED on 12/16/2019 from PCPs office for abnormal labs. 10 days prior to presentation, patient started 'fasting for spiritual reasons' where he did not eat anything but drank plenty of fluids.  In about a week, he started having bilious vomiting.  He gave up fasting but could not tolerate any food. 9/1, pt went to PCPs office for right foot wound.  Routine labs showed elevated creatinine and hence patient was sent to the ED. Also reports chronic freq stools/ostomy output >10times/day at baseline, supposed to be on lomotil but has not taken this in the past few weeks. In the ED,  hemodynamically stable Labs noted  creatinine elevated to 3.48, baseline 1.2, lipase elevated to 278, LFT normal. CT scan of abdomen pelvis did not show any acute abnormality  Hospital Course:   Acute kidney injury  -Admitted with creatinine of  3.5 up from baseline of 1.2  -due to dehydration related to poor oral intake and chronic GI losses, has high output from his colostomy and daily naproxen use -Hydrated with normal saline, Lomotil added 4 times daily -Kidney function improving, creatinine down to 1.9 today -Follow-up with Dr. Joya Gaskins in 1 week, check BMP at follow-up  H/o PE -Xarelto held in the setting of AKI and was briefly on a heparin bridge instead, since kidney function is improving discussed with Pharm.D. will restart Xarelto at discharge  Hypomagnesemia, hypokalemia -Replaced  Intractable nausea vomiting -Resolved, -CT abdomen pelvis was unremarkable  Right foot scab -No evidence of secondary infection, does not need antibiotics for it  at this time  History of Crohn's disease, colon cancer s/p colectomy -No evidence of obstruction CT abdomen pelvis.  It showed some parasternal hernia and mild ileus likely chronic. -Ostomy nurse consulted -Follow-up with gastroenterology, patient reports that he is unable to do so since he does not have Medicaid yet in [place]  Recent L4-L5 injury with right foot drop -Patient walks with a walker. -On pain control with oxycodone 15 mg every 6 hours at baseline, this was continued, per nursing staff was requesting IV narcotics while inpatient  Alcohol use -Patient denies daily alcohol use, no withdrawal noted  Schizoaffective disorder  Continue seroquel, Depakote, Cymbalta.   Discharge Exam: Vitals:   12/20/19 0449 12/20/19 1021  BP: 114/81 128/89  Pulse: 93 91  Resp: 18 18  Temp: 98.4 F (36.9 C) 98.2 F (36.8 C)  SpO2: 99% 97%  General: Awake alert oriented x3 Cardiovascular: S1-S2, regular rate rhythm Respiratory: Clear  Discharge Instructions   Discharge Instructions    Ambulatory referral to Physical Therapy   Complete by: As directed    Diet - low sodium heart healthy   Complete by: As directed    Increase activity slowly    Complete by: As directed    No wound care   Complete by: As directed      Allergies as of 12/20/2019      Reactions   Azathioprine Other (See Comments)   Pt does not recall reaction    Ciprofloxacin Other (See Comments)   Pt does not recall reaction    Mesalamine Other (See Comments)   Pt does not recall reaction   Metronidazole Other (See Comments)   Pt does not recall reaction    Sulfa Antibiotics Other (See Comments)   Pt does not recall reaction    Barium Sulfate Rash   Barium-containing Compounds Rash      Medication List    STOP taking these medications   doxycycline 100 MG tablet Commonly known as: VIBRA-TABS   naproxen sodium 220 MG tablet Commonly known as: ALEVE     TAKE these medications   acetaminophen 325 MG tablet Commonly known as: TYLENOL Take 2 tablets (650 mg total) by mouth every 6 (six) hours as needed for mild pain (or Fever >/= 101).   diphenoxylate-atropine 2.5-0.025 MG tablet Commonly known as: LOMOTIL Take 1 tablet by mouth 4 (four) times daily. What changed: when to take this   divalproex 500 MG DR tablet Commonly known as: DEPAKOTE Take 1 tablet (500 mg total) by mouth 2 (two) times daily.   DULoxetine 60 MG capsule Commonly known as: Cymbalta Take 1 capsule (60 mg total) by mouth daily.   naloxone 4 MG/0.1ML Liqd nasal spray kit Commonly known as: NARCAN Use as needed for overdose symptoms from opiate   oxyCODONE 15 MG immediate release tablet Commonly known as: ROXICODONE Take 1 tablet (15 mg total) by mouth every 6 (six) hours as needed for pain.   QUEtiapine 300 MG tablet Commonly known as: SEROQUEL Take 1 tablet (300 mg total) by mouth at bedtime.   QUEtiapine 50 MG tablet Commonly known as: SEROQUEL Take 1 tablet (50 mg total) by mouth 2 (two) times daily.   rivaroxaban 20 MG Tabs tablet Commonly known as: XARELTO Take 1 tablet (20 mg total) by mouth daily with supper.   simethicone 80 MG chewable tablet Commonly  known as: MYLICON Chew 80 mg by mouth every 6 (six) hours as needed for flatulence.            Durable Medical Equipment  (From admission, onward)         Start     Ordered   12/20/19 1315  For home use only DME Cane  Once       Comments: Straight cane   12/20/19 1315         Allergies  Allergen Reactions  . Azathioprine Other (See Comments)    Pt does not recall reaction    . Ciprofloxacin Other (See Comments)    Pt does not recall reaction    . Mesalamine Other (See Comments)    Pt does not recall reaction  . Metronidazole Other (See Comments)    Pt does not recall reaction   . Sulfa Antibiotics Other (See Comments)    Pt does not recall reaction   . Barium Sulfate Rash  .  Barium-Containing Compounds Rash      The results of significant diagnostics from this hospitalization (including imaging, microbiology, ancillary and laboratory) are listed below for reference.    Significant Diagnostic Studies: CT ABDOMEN PELVIS WO CONTRAST  Result Date: 12/17/2019 CLINICAL DATA:  Abdominal pain, history of colon cancer and Crohn's disease EXAM: CT ABDOMEN AND PELVIS WITHOUT CONTRAST TECHNIQUE: Multidetector CT imaging of the abdomen and pelvis was performed following the standard protocol without IV contrast. COMPARISON:  Aug 25, 2019 FINDINGS: Lower chest: The visualized heart size within normal limits. No pericardial fluid/thickening. No hiatal hernia. Hazy ground-glass opacities are seen at both lung bases, left greater than right. Hepatobiliary: Although limited due to the lack of intravenous contrast, normal in appearance without gross focal abnormality. Small layering calcified gallstones are present. Pancreas:  Unremarkable.  No surrounding inflammatory changes. Spleen: Normal in size. Although limited due to the lack of intravenous contrast, normal in appearance. Adrenals/Urinary Tract: Both adrenal glands appear normal. The kidneys and collecting system appear normal  without evidence of urinary tract calculus or hydronephrosis. Bladder is unremarkable. Stomach/Bowel: The stomach is normal in appearance. Again noted is a left-sided mid abdominal colostomy site with a prior left hemicolectomy. There is a small parastomal hernia noted with a loop of small bowel. The proximal portion of the small bowel loop appears to be mildly prominent measuring up to 2.8 mm. However no surrounding fat stranding changes are seen. There is some clumping of small bowel loops seen within the left deep pelvis. No significant surrounding wall thickening or inflammatory changes are noted. Presacral soft tissue density is again identified. Vascular/Lymphatic: There are no enlarged abdominal or pelvic lymph nodes. No significant gross vascular findings are present. Reproductive: The prostate is unremarkable. Other: No evidence of abdominal wall mass or hernia. Musculoskeletal: No acute or significant osseous findings. IMPRESSION: Status post left hemicolectomy with a left mid abdominal colostomy site. Adjacent parastomal hernia containing a loop of ileum. The proximal ileal loops entering the parastomal hernia appears to be mildly dilated which could be due to focal ileus. Clumping of small bowel loops within the left deep pelvis which could be related to the patient's history of Crohn's. Mild ground-glass opacities at both lung bases, left greater than right which is non-specific could be due to inflammatory or infectious etiology. Electronically Signed   By: Prudencio Pair M.D.   On: 12/17/2019 02:10   DG Foot 2 Views Right  Result Date: 12/16/2019 CLINICAL DATA:  Infected abrasion of the right foot. EXAM: RIGHT FOOT - 2 VIEW COMPARISON:  None. FINDINGS: There is no evidence of fracture or dislocation. There is no evidence of arthropathy or other focal bone abnormality. A 1.0 cm x 0.8 cm focal lucency is seen within the soft tissues along the plantar aspect of the right heel. IMPRESSION: Small focal  lucency overlying the soft tissues of the right heel which may represent a small amount of soft tissue air. Electronically Signed   By: Virgina Norfolk M.D.   On: 12/16/2019 23:02   DG Foot Complete Left  Result Date: 12/16/2019 CLINICAL DATA:  Big toe infection. EXAM: LEFT FOOT - COMPLETE 3+ VIEW COMPARISON:  None. FINDINGS: There is no evidence of an acute fracture or dislocation. A chronic fracture deformity is seen involving the midportion of the third left metatarsal. There is no evidence of arthropathy or other focal bone abnormality. Soft tissues are unremarkable. IMPRESSION: No acute osseous abnormality. Electronically Signed   By: Joyce Gross.D.  On: 12/16/2019 23:03    Microbiology: Recent Results (from the past 240 hour(s))  SARS Coronavirus 2 by RT PCR (hospital order, performed in [facility] hospital lab) Nasopharyngeal Nasopharyngeal Swab     Status: None   Collection Time: 12/16/19  9:40 PM   Specimen: Nasopharyngeal Swab  Result Value Ref Range Status   SARS Coronavirus 2 NEGATIVE NEGATIVE Final    Comment: (NOTE) SARS-CoV-2 target nucleic acids are NOT DETECTED.  The SARS-CoV-2 RNA is generally detectable in upper and lower respiratory specimens during the acute phase of infection. The lowest concentration of SARS-CoV-2 viral copies this assay can detect is 250 copies / mL. A negative result does not preclude SARS-CoV-2 infection and should not be used as the sole basis for treatment or other patient management decisions.  A negative result may occur with improper specimen collection / handling, submission of specimen other than nasopharyngeal swab, presence of viral mutation(s) within the areas targeted by this assay, and inadequate number of viral copies (<250 copies / mL). A negative result must be combined with clinical observations, patient history, and epidemiological information.  Fact Sheet for Patients:    StrictlyIdeas.no  Fact Sheet for Healthcare Providers: BankingDealers.co.za  This test is not yet approved or  cleared by the [place] FDA and has been authorized for detection and/or diagnosis of SARS-CoV-2 by FDA under an Emergency Use Authorization (EUA).  This EUA will remain in effect (meaning this test can be used) for the duration of the COVID-19 declaration under Section 564(b)(1) of the Act, 21 U.S.C. section 360bbb-3(b)(1), unless the authorization is terminated or revoked sooner.  Performed at [facility], [place] [address], [place], [place] [address]      Labs: Basic Metabolic Panel: Recent Labs  Lab 12/16/19 1124 12/16/19 2046 12/17/19 0448 12/17/19 0448 12/17/19 0913 12/17/19 1903 12/18/19 0419 12/19/19 0310 12/20/19 0440  NA   < >  --  133*   < > 133* 135 135 137 135  K   < >  --  2.8*   < > 2.7* 3.3* 3.7 3.5 3.6  CL   < >  --  97*   < > 97* 99 99 100 101  CO2   < >  --  21*   < > _0 GLUCOSE   < >  --  100*   < > 96 128* 85 107* 116*  BUN   < >  --  37*   < > 36* 34* 30* 20 14  CREATININE   < >  --  3.42*   < > 3.48* 3.65* 3.19* 2.46* 1.93*  CALCIUM   < >  --  8.2*   < > 8.1* 8.3* 8.3* 8.4* 8.2*  MG  --  1.5* 1.6*  --   --   --   --   --   --   PHOS  --   --   --   --  3.1  --   --   --   --    < > = values in this interval not displayed.   Liver Function Tests: Recent Labs  Lab 12/15/19 1133 12/16/19 1124 12/17/19 0448  AST 32 38 32  ALT 39 37 31  ALKPHOS 130* 91 86  BILITOT 0.6 1.0 1.0  PROT 7.8 7.9 7.1  ALBUMIN 4.6 3.5 3.1*   Recent Labs  Lab 12/16/19 1124 12/17/19 0913 12/18/19 0419  LIPASE 278* 271* 204*  No results for input(s): AMMONIA in the last 168 hours. CBC: Recent Labs  Lab 12/15/19 1133 12/16/19 1124 12/17/19 0448 12/18/19 0419 12/19/19 0310 12/20/19 0440  WBC 20.1* 15.4* 7.8 3.5* 2.8* 2.6*  NEUTROABS 16.6*  --   --   --   --   --   HGB 14.9  14.2 11.7* 10.6* 10.0* 9.7*  HCT 42.4 40.7 33.5* 31.3* 30.2* 29.7*  MCV 89 90.4 90.1 93.7 95.0 95.2  PLT 178 158 100* PLATELET CLUMPS NOTED ON SMEAR, UNABLE TO ESTIMATE 80* 80*   Cardiac Enzymes: No results for input(s): CKTOTAL, CKMB, CKMBINDEX, TROPONINI in the last 168 hours. BNP: BNP (last 3 results) Recent Labs    08/18/19 1153  BNP 19.6    ProBNP (last 3 results) No results for input(s): PROBNP in the last 8760 hours.  CBG: Recent Labs  Lab 12/18/19 0651  GLUCAP 109*   Signed:  Domenic Polite MD.  [facility] 12/20/2019, 2:42 PM

## 2019-12-20 NOTE — Progress Notes (Signed)
ANTICOAGULATION CONSULT NOTE - Follow Up Consult  Pharmacy Consult for heparin Indication: history of PE  Assessment: 39 YO male presenting to the ED with AKI.  Patient is on chronic Xarelto due to history of PE but will be switched to IV heparin due to AKI.  He missed several days of his Xarelto due to vomiting.  Current plan is to restart Xarelto once AKI resolves.  Heparin level 0.21 units/ml  No issues noted with infusion  Goal of Therapy:  Heparin level 0.3-0.7 units/ml Monitor platelets by anticoagulation protocol: Yes   Plan:  -Increase heparin drip to 2000 units/hr -Check heparin level later today -Check daily CBC  Thanks for allowing pharmacy to be a part of this patient's care.  Excell Seltzer, PharmD Clinical Pharmacist

## 2019-12-20 NOTE — Plan of Care (Signed)
  Problem: Education: Goal: Knowledge of disease and its progression will improve Outcome: Progressing   Problem: Pain Managment: Goal: General experience of comfort will improve Outcome: Progressing

## 2019-12-20 NOTE — TOC Transition Note (Addendum)
Transition of Care [facility]) - CM/SW Discharge Note   Patient Details  Name: Juan Hester MRN: [id number] Date of Birth: [birth year]  [facility] CM/SW Contact:  Bartholomew Crews, RN Phone Number: [phone] 12/20/2019, 11:26 AM   Clinical Narrative:     Spoke with patient at the bedside. He states that [place] States that his ex-wife and children are in [place].   He has family local to [facility], and states that his aunt will assist with transportation home from the hospital.   Patient will call PCP at [facility] tomorrow concerning his medication refills.   He states that he had in colon cancer in 2019 r/t history of chrons disease, and he gets his ostomy supplies through a program at [employer].   He states that was working at [employer] until fell on wet floor and hurt his back. He is currently working with a Chief Executive Officer for disability.   Update: Patient asking about cane per PT recommendations. Delivered cane to bedside from [place]. Advised patient that someone from [employer] would follow up to gather charity information. Patient stated that he has workman's comp - advised to provide his workman comp information. Patient verbalized understanding. Cab voucher provided for transportation home.   No further TOC needs identified.   Final next level of care: Home/Self Care Barriers to Discharge: No Barriers Identified   Patient Goals and CMS Choice Patient states their goals for this hospitalization and ongoing recovery are:: return home CMS Medicare.gov Compare Post Acute Care list provided to:: Patient Choice offered to / list presented to : Patient  Discharge Placement                       Discharge Plan and Services In-house Referral: Financial Counselor Discharge Planning Services: CM Consult Post Acute Care Choice: NA          DME Arranged: N/A DME Agency: NA       HH Arranged: NA HH Agency:  NA        Social Determinants of Health (SDOH) Interventions     Readmission Risk Interventions No flowsheet data found.

## 2019-12-21 ENCOUNTER — Telehealth: Payer: Self-pay

## 2019-12-21 MED FILL — DIPHENOXYLATE-ATROPINE 2.5-: 2.5-0.025 | 22 days supply | Qty: 90 | Fill #0

## 2019-12-21 NOTE — Telephone Encounter (Signed)
Transition Care Management Follow-up Telephone Call Date of discharge and from where: 12/20/2019, [facility]   Call placed to patient # [phone], message left with call back requested to this CM.  Needs to schedule hospital follow up appointment

## 2019-12-22 ENCOUNTER — Encounter (HOSPITAL_COMMUNITY): Payer: Self-pay

## 2019-12-22 ENCOUNTER — Other Ambulatory Visit: Payer: Self-pay

## 2019-12-22 ENCOUNTER — Telehealth: Payer: Self-pay

## 2019-12-22 ENCOUNTER — Ambulatory Visit (HOSPITAL_COMMUNITY)
Admission: EM | Admit: 2019-12-22 | Discharge: 2019-12-22 | Disposition: A | Discharge status: Home / Self Care | Payer: Self-pay | Attending: Family Medicine | Admitting: Family Medicine

## 2019-12-22 DIAGNOSIS — K146 Glossodynia: Secondary | ICD-10-CM | POA: Insufficient documentation

## 2019-12-22 DIAGNOSIS — R600 Localized edema: Secondary | ICD-10-CM | POA: Insufficient documentation

## 2019-12-22 LAB — COMPREHENSIVE METABOLIC PANEL
ALT: 28 U/L (ref 0–44)
AST: 43 U/L — ABNORMAL HIGH (ref 15–41)
Albumin: 3.3 g/dL — ABNORMAL LOW (ref 3.5–5.0)
Alkaline Phosphatase: 109 U/L (ref 38–126)
Anion gap: 12 (ref 5–15)
BUN: 6 mg/dL (ref 6–20)
CO2: 26 mmol/L (ref 22–32)
Calcium: 9.1 mg/dL (ref 8.9–10.3)
Chloride: 100 mmol/L (ref 98–111)
Creatinine, Ser: 1.64 mg/dL — ABNORMAL HIGH (ref 0.61–1.24)
GFR calc Af Amer: >60 mL/min (ref >60)
GFR calc non Af Amer: 52 mL/min — ABNORMAL LOW (ref >60)
Glucose, Bld: 164 mg/dL — ABNORMAL HIGH (ref 70–99)
Potassium: 3.4 mmol/L — ABNORMAL LOW (ref 3.5–5.1)
Sodium: 138 mmol/L (ref 135–145)
Total Bilirubin: 0.6 mg/dL (ref 0.3–1.2)
Total Protein: 7.2 g/dL (ref 6.5–8.1)

## 2019-12-22 LAB — CBC WITH DIFFERENTIAL/PLATELET
Abs Immature Granulocytes: 0.05 10*3/uL (ref 0.00–0.07)
Basophils Absolute: 0.1 10*3/uL (ref 0.0–0.1)
Basophils Relative: 1 %
Eosinophils Absolute: 0.1 10*3/uL (ref 0.0–0.5)
Eosinophils Relative: 2 %
HCT: 36.4 % — ABNORMAL LOW (ref 39.0–52.0)
Hemoglobin: 12 g/dL — ABNORMAL LOW (ref 13.0–17.0)
Immature Granulocytes: 1 %
Lymphocytes Relative: 9 %
Lymphs Abs: 0.6 10*3/uL — ABNORMAL LOW (ref 0.7–4.0)
MCH: 30.8 pg (ref 26.0–34.0)
MCHC: 33 g/dL (ref 30.0–36.0)
MCV: 93.3 fL (ref 80.0–100.0)
Monocytes Absolute: 0.5 10*3/uL (ref 0.1–1.0)
Monocytes Relative: 8 %
Neutro Abs: 5.5 10*3/uL (ref 1.7–7.7)
Neutrophils Relative %: 79 %
Platelets: 156 10*3/uL (ref 150–400)
RBC: 3.9 MIL/uL — ABNORMAL LOW (ref 4.22–5.81)
RDW: 13.7 % (ref 11.5–15.5)
WBC: 6.8 10*3/uL (ref 4.0–10.5)
nRBC: 0 % (ref 0.0–0.2)

## 2019-12-22 NOTE — ED Triage Notes (Signed)
Pt referred by [facility] for lab draw to monitor kidney function. Pt states he was recently d/c from hospitatl for kidney failure. Also c/o pain to tongue when drinking water and states "that usually means I'm dehydrated." Also c/o nausea.  Denies flank/abdominal pain, fever, chills, n/v/d, dysuria, hematuria. Mucous linings appear somewhat pale. Lips shaking when holding thermometer, pt intolerable of BP cuff.

## 2019-12-22 NOTE — ED Provider Notes (Signed)
[place]    CSN: [id number] Arrival date & time: 12/22/19  1259      History   Chief Complaint Chief Complaint  Patient presents with   labs    HPI Juan Hester is a 39 y.o. male.   Significant tongue pain, nausea off and on, and right foot pain left foot edema the past day or two. States his tongue gets this way when he's in a crohn's flare and dehydrated, which he is frequently hospitalized for historically. Most recent hospitalization was last week, which was for dehydration, AKI and pancreatitis. Called his PCP this morning who instructed he come here for eval given his recurrent sxs to see if he needs to go back to the ER. He has a known right foot wound which has been followed closely and is not currently infected but very painful. Denies fever, chills, dizziness, syncope, abdominal pain, vomiting at this time. Has been tolerating PO but eats cautiously given his excessive stool output into colostomy bag as of late.     Past Medical History:  Diagnosis Date   Colon cancer ([place])    Crohn disease ([place])    PE (pulmonary thromboembolism) ([place])    Thyroid disease     Patient Active Problem List   Diagnosis Date Noted   Acute renal failure ([place]) 12/16/2019   Hypokalemia 12/16/2019   Colostomy status ([place]) 12/16/2019   History of pulmonary embolism 12/16/2019   Nausea and vomiting 12/16/2019   Right foot drop 12/15/2019   Abscess or cellulitis of foot 12/15/2019   GAD (generalized anxiety disorder) 10/20/2019   MDD (major depressive disorder) 10/20/2019   PTSD (post-traumatic stress disorder) 10/20/2019   Lumbar radiculopathy 10/19/2019   Bipolar affective disorder, currently depressed, mild ([place]) 10/16/2019   ETOH abuse 10/16/2019   Protrusion of lumbar intervertebral disc 10/05/2019   Localized edema 08/18/2019   Multiple subsegmental pulmonary emboli without acute cor pulmonale ([place]) 07/21/2019   Bipolar 1 disorder ([place])  07/21/2019   History of colon cancer 07/21/2019   Post-traumatic stress disorder, unspecified 06/03/2019   Schizoaffective disorder, bipolar type ([place]) 06/03/2019   Nicotine dependence, cigarettes, uncomplicated 85/63/1497   Crohn's disease with complication ([place]) 02/63/7858   Colostomy, evaluate ([place]) 05/05/2019    Past Surgical History:  Procedure Laterality Date   ABDOMINAL SURGERY     COLON SURGERY     IR RADIOLOGIST EVAL & MGMT  08/31/2019   OSTOMY     SMALL INTESTINE SURGERY         Home Medications    Prior to Admission medications   Medication Sig Start Date End Date Taking? Authorizing Provider  divalproex (DEPAKOTE) 500 MG DR tablet Take 1 tablet (500 mg total) by mouth 2 (two) times daily. 11/18/19  Yes [name], MD  DULoxetine (CYMBALTA) 60 MG capsule Take 1 capsule (60 mg total) by mouth daily. 12/15/19  Yes [name], MD  oxyCODONE (ROXICODONE) 15 MG immediate release tablet Take 1 tablet (15 mg total) by mouth every 6 (six) hours as needed for pain. 12/15/19  Yes [name], MD  QUEtiapine (SEROQUEL) 300 MG tablet Take 1 tablet (300 mg total) by mouth at bedtime. 11/18/19  Yes [name], MD  rivaroxaban (XARELTO) 20 MG TABS tablet Take 1 tablet (20 mg total) by mouth daily with supper. 12/15/19  Yes [name], MD  acetaminophen (TYLENOL) 325 MG tablet Take 2 tablets (650 mg total) by mouth every 6 (six) hours as needed for  mild pain (or Fever >/= 101). 12/20/19   Domenic Polite, MD  diphenoxylate-atropine (LOMOTIL) 2.5-0.025 MG tablet Take 1 tablet by mouth 4 (four) times daily. 12/20/19   Domenic Polite, MD  naloxone [facility]) nasal spray 4 mg/0.1 mL Use as needed for overdose symptoms from opiate 10/19/19   [name], MD  QUEtiapine (SEROQUEL) 50 MG tablet Take 1 tablet (50 mg total) by mouth 2 (two) times daily. 11/18/19   [name], MD  simethicone (MYLICON) 80 MG chewable tablet Chew 80 mg by mouth every 6 (six)  hours as needed for flatulence.    [provider]    Family History History reviewed. No pertinent family history.  Social History Social History   Tobacco Use   Smoking status: Current Every Day Smoker    Packs/day: 1.00   Smokeless tobacco: Never Used  Vaping Use   Vaping Use: Never used  Substance Use Topics   Alcohol use: Yes   Drug use: Not Currently     Allergies   Azathioprine, Ciprofloxacin, Mesalamine, Metronidazole, Sulfa antibiotics, Barium sulfate, and Barium-containing compounds   Review of Systems Review of Systems   Physical Exam Triage Vital Signs ED Triage Vitals  Enc Vitals Group     BP 12/22/19 1450 (!) 144/93     Pulse Rate 12/22/19 1450 98     Resp 12/22/19 1450 20     Temp 12/22/19 1450 98.4 F (36.9 C)     Temp Source 12/22/19 1450 Oral     SpO2 12/22/19 1450 100 %     Weight --      Height --      Head Circumference --      Peak Flow --      Pain Score 12/22/19 1447 10     Pain Loc --      Pain Edu? --      Excl. in [place]? --    No data found.  Updated Vital Signs BP (!) 144/93 (BP Location: Right Arm)    Pulse 98    Temp 98.4 F (36.9 C) (Oral)    Resp 20    SpO2 100%   Visual Acuity Right Eye Distance:   Left Eye Distance:   Bilateral Distance:    Right Eye Near:   Left Eye Near:    Bilateral Near:     Physical Exam   UC Treatments / Results  Labs (all labs ordered are listed, but only abnormal results are displayed) Labs Reviewed  CBC WITH DIFFERENTIAL/PLATELET  COMPREHENSIVE METABOLIC PANEL    EKG   Radiology No results found.  Procedures Procedures (including critical care time)  Medications Ordered in UC Medications - No data to display  Initial Impression / Assessment and Plan / UC Course  I have reviewed the triage vital signs and the nursing notes.  Pertinent labs & imaging results that were available during my care of the patient were reviewed by me and considered in my medical  decision making (see chart for details).     Will draw basic labs today and refer back to ER if back in renal failure or other significant abnormality presents. Continue working on improving PO intake, declines supportive medications at this time. Strict ER precautions reviewed if worsening prior to getting labs resulted.   Final Clinical Impressions(s) / UC Diagnoses   Final diagnoses:  Tongue pain  Leg edema     Discharge Instructions     We will call you about your lab  results once they come back    ED Prescriptions    None     PDMP not reviewed this encounter.   Volney American, [place] 12/22/19 1557

## 2019-12-22 NOTE — Telephone Encounter (Signed)
Transition Care Management Follow-up Telephone Call  Date of discharge and from where: 12/20/2019, [facility]   How have you been since you were released from the hospital? He stated that he is not doing too well. He explained that he is very concerned about becoming dehydrated again.  He said that he is not even able to drink water because his tongue is constantly painful and feels like it is on fire.  He rated the pain 8/10. He also explained that his left foot is twice the size that it should be and he did not sustain an injury to that foot. He said that his right foot feels like it is on fire. He noted that he does not take tylenol and the oxycodone is not helping the pain. Discussed going to Urgent Care to be evaluated and he said he would ask his mother to take him.  Explained that Urgent Care will send him to the ED if that is warranted and he said he understood.    Any questions or concerns? noted above   Items Reviewed:  Did the pt receive and understand the discharge instructions provided? yes  Medications obtained and verified?he said he has all of his medications and did not have any questions about his med regime  Any new allergies since your discharge?  none reported   Do you have support at home?  he lives alone.  No home health ordered.   Ostomy supplies through [employer] Questionnaire: (I = Independent and D = Dependent) ADLs: independent, has cane to use when needed  Follow up appointments reviewed:   PCP Hospital f/u appt confirmed? He said he will call to schedule an appointment after he is seen in Urgent [facility] f/u appt confirmed?outpatient PT- 01/06/2020  Are transportation arrangements needed?he said he takes the bus or will ask his mother to drive him   If their condition worsens, is the pt aware to call PCP or go to the Emergency Dept.?  yes  Was the patient provided with contact information for the PCP's office or  ED? he has the phone number for the clinic and also communicates through [place]  Was to pt encouraged to call back with questions or concerns?  yes

## 2019-12-22 NOTE — Discharge Instructions (Addendum)
We will call you about your lab results once they come back

## 2019-12-23 ENCOUNTER — Telehealth (HOSPITAL_COMMUNITY): Payer: Self-pay

## 2019-12-24 NOTE — Telephone Encounter (Signed)
Patient is calling [facility] back [phone]

## 2019-12-27 ENCOUNTER — Other Ambulatory Visit: Payer: Self-pay

## 2019-12-27 ENCOUNTER — Ambulatory Visit: Payer: Self-pay | Attending: Critical Care Medicine | Admitting: Critical Care Medicine

## 2019-12-27 ENCOUNTER — Encounter: Payer: Self-pay | Admitting: Critical Care Medicine

## 2019-12-27 DIAGNOSIS — L03119 Cellulitis of unspecified part of limb: Secondary | ICD-10-CM

## 2019-12-27 DIAGNOSIS — M5126 Other intervertebral disc displacement, lumbar region: Secondary | ICD-10-CM

## 2019-12-27 DIAGNOSIS — R1114 Bilious vomiting: Secondary | ICD-10-CM

## 2019-12-27 DIAGNOSIS — M21371 Foot drop, right foot: Secondary | ICD-10-CM

## 2019-12-27 DIAGNOSIS — IMO0002 Reserved for concepts with insufficient information to code with codable children: Secondary | ICD-10-CM

## 2019-12-27 DIAGNOSIS — E876 Hypokalemia: Secondary | ICD-10-CM

## 2019-12-27 DIAGNOSIS — Z5900 Homelessness unspecified: Secondary | ICD-10-CM

## 2019-12-27 DIAGNOSIS — M5416 Radiculopathy, lumbar region: Secondary | ICD-10-CM

## 2019-12-27 DIAGNOSIS — L02619 Cutaneous abscess of unspecified foot: Secondary | ICD-10-CM

## 2019-12-27 DIAGNOSIS — N179 Acute kidney failure, unspecified: Secondary | ICD-10-CM

## 2019-12-27 MED ORDER — DIPHENOXYLATE-ATROPINE 2.5-0.025 MG PO TABS
1.0000 | ORAL_TABLET | Freq: Four times a day (QID) | ORAL | 0 refills | Status: DC
Start: 2019-12-27 — End: 2020-01-31

## 2019-12-27 MED ORDER — OXYCODONE HCL 15 MG PO TABS
15.0000 mg | ORAL_TABLET | Freq: Four times a day (QID) | ORAL | 0 refills | Status: DC | PRN
Start: 2019-12-27 — End: 2020-01-13

## 2019-12-27 MED ORDER — GABAPENTIN 300 MG PO CAPS: 300.0000 mg | ORAL_CAPSULE | Freq: Three times a day (TID) | ORAL | 3 refills | Status: DC

## 2019-12-27 MED FILL — GABAPENTIN 300 MG CAPSULE: 300 | 30 days supply | Qty: 90 | Fill #0

## 2019-12-27 MED FILL — oxyCODONE HCL 15 MG TABS: 15 | 10 days supply | Qty: 40 | Fill #0

## 2019-12-27 MED FILL — DIPHENOXYLATE-ATROPINE 2.5-: 2.5-0.025 | 23 days supply | Qty: 90 | Fill #0

## 2019-12-27 NOTE — Assessment & Plan Note (Signed)
Patient tells me he is currently run out of money to pay for any more of his hotel rooms  I did connect him with our nurse case manager to see if [facility] homeless[facility] or serving house could be is of assistance finding him housing

## 2019-12-27 NOTE — Assessment & Plan Note (Signed)
No abscess observed on today's exam

## 2019-12-27 NOTE — Assessment & Plan Note (Signed)
Hypokalemia we will recheck metabolic profile

## 2019-12-27 NOTE — Assessment & Plan Note (Signed)
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

## 2019-12-27 NOTE — Assessment & Plan Note (Signed)
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

## 2019-12-27 NOTE — Progress Notes (Signed)
Subjective:    Patient ID: Juan Hester, male    DOB: 1980/08/04, 39 y.o.   MRN: [id number] Virtual Visit via Video Note  I connected with@ on 12/27/19 at@ by a video enabled telemedicine application and verified that I am speaking with the correct person using two identifiers.   Consent:  I discussed the limitations, risks, security and privacy concerns of performing an evaluation and management service by video visit and the availability of in person appointments. I also discussed with the patient that there may be a patient responsible charge related to this service. The patient expressed understanding and agreed to proceed.  Location of patient: Patient was at home  Location of provider: I was in my office  Persons participating in the televisit with the patient.   No one else on the call    History of Present Illness:  First OV 07/21/19 This is a complex 39 year old male who just arrived here from the [place] area and currently is living with relatives who presents for evaluation of pulmonary embolism history, Crohn's disease, history of colon carcinoma status post sigmoid resection with colostomy formation, and need for paperwork regarding patient's work status.  The patient currently is living in a hotel.  Patient arrived here in December.  The patient in [place] was under care by gastroenterologist was on Remicade for Crohn's disease and also being followed for his oncology status.  He is due for a colonoscopy at this time.  He works at [employer] he has history of bipolar disorder and had previously been on Cogentin Zyprexa, Depakote and Cymbalta.  Patient is also had major depression as well in the past.  The patient states he has significant volume output from his colostomy with a diarrheal type syndrome and he does have Imodium for this.  The patient has abdominal pain at times.  He has chronic fatigue.  He cannot stand and one place for prolonged periods of time.  He fell at  work and his lower back has been difficult for him since that fall.  He was in the emergency room and underwent CT scan of the head and this was negative.  The patient is needing refills on his medications and he does not have insurance at this time.  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  The patient states his back pain is worsening.  He did fall at work and March.  He states the pain was tolerable but now markedly worse if he stands for long periods of time.  He was not able to use a Lidoderm patch as he cannot reach the center part of his back.  He notes pain shooting down both legs.  He has some difficulty walking as well at this time.  There has not been any lumbar imaging studies done although when he went to the emergency room previously there was a CT scan of the abdomen was done and did not show changes in the lumbar spine but this was a limited study  Patient states his cyclobenzaprine is not useful but he is not taking it on a regular basis The patient has no change in his breathing status and is  maintaining his anticoagulant for history of pulmonary emboli.  The patient is yet to achieve a gastroenterology appointment as he needs the orange card first.  He has his financial paperwork together so make an appointment for the financial counselor  08/18/2019 The patient comes in with lower extremity edema and is just gone to the ER 1 day ago for progressive back pain which occurred on the job.  He has since been let go from the job he was working.  He has another job as well he works.  The patient is yet to receive his lumbar x-rays as ordered.  He does not have financial assistance as of yet to  be able to achieve an MRI or orthopedics appointment.  Patient notes increased edema in the lower extremities and some shortness of breath with exertion. Note I reviewed his CT scan of the chest March which did not show pulmonary emboli but did show atelectasis at the bases of the lungs.   Wt Readings from Last 3 Encounters: 08/18/19 : 274 lb (124.3 kg) 07/21/19 : 264 lb 3.2 oz (119.8 kg) 07/11/19 : 260 lb (117.9 kg)  Patient cannot sleep flat is still smoking half pack a day of cigarettes denies wheezing slight productive cough of green mucus no sinus complaints  09/01/2019 This patient is seen in return follow-up and has been to the emergency room twice since I last saw him several weeks ago.  Patient has continued pain in the lower back.  He has a normal lumbar study.  He had fallen at work.  He is now been fired from his job.  He has retained an attorney for [employer].  The patient states his pain is more in the upper thoracic area as well as in the lumbar area at this time.  Note he also went to interventional radiology they recommend not starting in this patient with an in fear vena cava filter but instead study his lower extremities first.  He is yet to have a vascular study the lower extremities for DVT.  He is now off Xarelto because he was bleeding from an opening of the rectal closure from his previous total colectomy.  The patient still has some difficulty in that area but the bleeding is less off the Xarelto.  Patient is also seeing gastroenterology and they are awaiting further evaluation of the rectal tear.  The patient is yet to see a surgeon for this.  He is here today for follow-up.  10/19/2019 This patient is seen in return follow-up and notes continued low back pain with radiculopathy down the left lower extremity.  He saw orthopedics and they recommended an injection however they said that they could not perform it under his Workmen's Comp. agreement.  He is  unable to afford any type of procedure as he is self-pay at this time.  He is also wishing a second opinion on his back.  In addition to this he has had increased depression and mood swings and this ultimately resulted in increased alcohol use when he was with a friend over the weekend and when the friend left him he became extremely despondent and started thinking of suicidal ideation.  He called for help and was brought to the emergency room where he had a 36-hour stay in involuntary commitment until he can be cleared by psychiatry which did occurred he was just discharged 24 hours ago.  He was given a paper prescription for Seroquel and also for his Depakote however he cannot  afford these medications at this time.  He was also not given a refill on Cymbalta and has run out of the Cymbalta.  He also has run out of his oxycodone as well for his back pain.  He does have a supply of Xarelto and maintains this 20 mg daily without any additional bleeding noted in the rectal area.  When he came to the ER he was very much confused had been drinking excessively and somewhat altered he did clear this out and he has not had further drinking since that time.  He states when he uses the oxycodone he only takes it about 3-4 times daily.  He states the cyclobenzaprine was of no use to him.  He has had several falls because of left-sided weakness in the lower extremity.  He feels like there is electric shock going down his leg and this resulted in the falls.  He has bruised his left shoulder with an injury and a fall in his bathroom. He was unable to obtain transportation therefore this visit was done via video Note the psychiatry doctor did discontinue his Zyprexa in favor of use of Seroquel 200 mg daily.  He does have follow-up with a behavioral therapist and psychiatrist in the next 24 hours [facility].  11/01/2019 This is a video visit and follow-up from her previous video visit  several weeks ago.  The patient attempted to get to see neurosurgery but they were going to charge her co-pay he was not able to achieve that visit.  The patient is no longer drinking alcohol regularly.  He still has significant anxiety.  He did see the mental health providers at the clinic and they prescribed hydroxyzine for anxiety and this has not helped his symptoms.  He is wishing to get back on the some type of benzodiazepine.  He is seeing a Social worker at this time.  He denies any bleeding from the rectal area.  He still has low back pain.  He is using the oxycodone as needed for pain and only minimally using this.  He has called his lawyer deceiving get Workmen's Comp. to cover the neurosurgical evaluation.  12/02/2019 I attempted to have a video visit with this patient he was in his hotel and appeared to be under some influence.  He could not focus or listen properly to the interaction.  He was stating he was having new onset shoulder pain and a cough.  He states his back is still tender.  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  He is trying to save up his money so that he can undergo spinal evaluations.  He is on a fairly high dose of oxycodone and this will need to be reduced.  I discussed with him the need and the importance of changing his pain medication.  He took this under advisement.  It was difficult to proceed with any other elements of this visit so I told the patient we would follow-up with him in the office setting  12/15/2019 This patient returns in follow-up and has severe pain and now is developed foot drop on the right lower extremity.  He has yet to make his orthopedic appointment.  He has lost 35 pounds in weight.  He has a sore on the dorsal aspect of his right foot with surrounding erythema that he has noticed.  His pain is worse in the lower back area.  Note he does have significant lumbar  disease on the MRI we previously have obtained.  He is out of his pain  medications at this time.  I explained to him in order to maintain him on pain medicine we need to have him come in for regular face-to-face visit so that we can obtain a urine drug screen.  He says he is not using other substances.  He does state that his mental health became a little unbalanced when he stopped taking some of his medicines but now is back on his mental health medications He states on the Xarelto he occasionally has some blood from his colostomy area and rectal areas  The patient says the foot drop occurred several weeks ago and he had to crawl about his apartment and developed sores on his feet and knees he says he is able to walk better now it suddenly changed  12/27/2019 This patient seen back in return follow-up after hospitalization between the second and 6 September.  During the hospitalization was found to have acute renal failure from severe volume depletion related to high colostomy output.  There was no evidence of active infection.  The patient had electrolyte derangements and elevated creatinine.  He was given IV fluids and told to follow-up.  He states since he has been home he still remains weak but he is improved compared to before.  He has less nausea.  He does have some black stools coming out of the ostomy.  He notes if he talks a lot his throat will gag and feel dry.  His pain in his lower back is not a big issue as it is in the lower extremity and will on the left. Below is a copy of the discharge summary  Admit date: 12/16/2019 Discharge date: 12/20/2019  Time spent: 35 minutes  Recommendations for Outpatient Follow-up:  1. PCP Dr. Joya Gaskins in 1 week, consider weaning narcotics, please check BMP in 1 week, emphasized compliance with Lomotil    Discharge Diagnoses:  Principal Problem:   Acute renal failure ([place]) High colostomy output   Crohn's disease with complication ([place])   History of colon cancer   ETOH abuse   Schizoaffective disorder, bipolar type  ([place])   Abscess or cellulitis of foot   Colostomy status ([place])   History of pulmonary embolism   Nausea and vomiting   Discharge Condition: Stable  Diet recommendation: Regular  Filed Weights  12/16/19 2113 12/18/19 0402 Weight: 115.7 kg 118.1 kg   History of present illness:  Hussain Ritteris a 39/M wCrohn's disease, colon cancer s/p L hemicolectomy and ostomy, prior multifocal PEs on Xarelto, schizoaffective disorder, alcohol use recent L4-5 injury with right foot drop. Patient was sent to the ED on 12/16/2019 from PCPs office for abnormal labs. 10 days prior to presentation, patient started 'fasting for spiritual reasons' where he did not eat anything but drank plenty of fluids. In about a week, he started having bilious vomiting. He gave up fasting but could not tolerate any food. 9/1, pt went to PCPs office for right foot wound. Routine labs showed elevated creatinine and hence patient was sent to the ED. Also reports chronic freq stools/ostomy output>10times/day at baseline, supposed to be on lomotilbut has not taken this in the past few weeks. In the ED, hemodynamically stable Labs noted creatinine elevated to 3.48, baseline 1.2, lipase elevated to 278, LFT normal. CT scan of abdomen pelvis did not show any acute abnormality  Hospital Course:   Acute kidney injury  -Admitted with creatinine of 3.5 up  from baseline of 1.2  -due to dehydration related to poor oral intake and chronic GI losses, has high output from his colostomy and daily naproxen use -Hydrated with normal saline, Lomotil added 4 times daily -Kidney function improving, creatinine down to 1.9 today -Follow-up with Dr. Joya Gaskins in 1 week, check BMP at follow-up  H/o PE -Xarelto held in the setting of AKI and was briefly on a heparin bridge instead, since kidney function is improving discussed with Pharm.D. will restart Xarelto at discharge  Hypomagnesemia, hypokalemia -Replaced  Intractable  nausea vomiting -Resolved, -CT abdomen pelvis was unremarkable  Right foot scab -No evidence of secondary infection, does not need antibiotics for it  at this time  History of Crohn's disease, colon cancer s/p colectomy -No evidence of obstruction CT abdomen pelvis. It showed some parasternal hernia and mild ileus likely chronic. -Ostomy nurse consulted -Follow-up with gastroenterology, patient reports that he is unable to do so since he does not have Medicaid yet in [place]  Recent L4-L5 injury with right foot drop -Patient walks with a walker. -On pain control with oxycodone 15 mg every 6 hours at baseline, this was continued, per nursing staff was requesting IV narcotics while inpatient  Alcohol use -Patient denies daily alcohol use, no withdrawal noted  Schizoaffective disorder  Continue seroquel, Depakote, Cymbalta.  There is been a transition of care visit with the case manager last week on the ninth.  Note the patient went to urgent care on the eighth and had follow-up labs which were stable.   The patient is working attorney to see if he can get disability or Workmen's Compensation for his back disease so he can see a Licensed conveyancer.  He knows without insurance he is not able to access any specialist at this time.  We have been getting his medications to him for free through the patient assistance fund.    Past Medical History:  Diagnosis Date  . Acute renal failure ([place]) 12/16/2019  . Colon cancer ([place])   . Crohn disease ([place])   . PE (pulmonary thromboembolism) ([place])   . Thyroid disease      History reviewed. No pertinent family history.   Social History   Socioeconomic History  . Marital status: Divorced    Spouse name: Not on file  . Number of children: Not on file  . Years of education: Not on file  . Highest education level: Not on file  Occupational History  . Not on file  Tobacco Use  . Smoking status: Current Every Day Smoker    Packs/day:  1.00  . Smokeless tobacco: Never Used  Vaping Use  . Vaping Use: Never used  Substance and Sexual Activity  . Alcohol use: Yes  . Drug use: Not Currently  . Sexual activity: Yes  Other Topics Concern  . Not on file  Social History Narrative  . Not on file   Social Determinants of Health   Financial Resource Strain:   . Difficulty of Paying Living Expenses: Not on file  Food Insecurity:   . Worried About Charity fundraiser in the Last Year: Not on file  . Ran Out of Food in the Last Year: Not on file  Transportation Needs:   . Lack of Transportation (Medical): Not on file  . Lack of Transportation (Non-Medical): Not on file  Physical Activity:   . Days of Exercise per Week: Not on file  . Minutes of Exercise per Session: Not on file  Stress:   .  Feeling of Stress : Not on file  Social Connections:   . Frequency of Communication with Friends and Family: Not on file  . Frequency of Social Gatherings with Friends and Family: Not on file  . Attends Religious Services: Not on file  . Active Member of Clubs or Organizations: Not on file  . Attends Archivist Meetings: Not on file  . Marital Status: Not on file  Intimate Partner Violence:   . Fear of Current or Ex-Partner: Not on file  . Emotionally Abused: Not on file  . Physically Abused: Not on file  . Sexually Abused: Not on file     Allergies  Allergen Reactions  . Azathioprine Other (See Comments)    Pt does not recall reaction    . Ciprofloxacin Other (See Comments)    Pt does not recall reaction    . Mesalamine Other (See Comments)    Pt does not recall reaction  . Metronidazole Other (See Comments)    Pt does not recall reaction   . Sulfa Antibiotics Other (See Comments)    Pt does not recall reaction   . Barium Sulfate Rash  . Barium-Containing Compounds Rash     Outpatient Medications Prior to Visit  Medication Sig Dispense Refill  . acetaminophen (TYLENOL) 325 MG tablet Take 2 tablets (650  mg total) by mouth every 6 (six) hours as needed for mild pain (or Fever >/= 101).    Marland Kitchen divalproex (DEPAKOTE) 500 MG DR tablet Take 1 tablet (500 mg total) by mouth 2 (two) times daily. 60 tablet 3  . DULoxetine (CYMBALTA) 60 MG capsule Take 1 capsule (60 mg total) by mouth daily. 30 capsule 3  . naloxone (NARCAN) nasal spray 4 mg/0.1 mL Use as needed for overdose symptoms from opiate 1 each 0  . QUEtiapine (SEROQUEL) 300 MG tablet Take 1 tablet (300 mg total) by mouth at bedtime. 30 tablet 2  . QUEtiapine (SEROQUEL) 50 MG tablet Take 1 tablet (50 mg total) by mouth 2 (two) times daily. 60 tablet 2  . rivaroxaban (XARELTO) 20 MG TABS tablet Take 1 tablet (20 mg total) by mouth daily with supper. 30 tablet 4  . simethicone (MYLICON) 80 MG chewable tablet Chew 80 mg by mouth every 6 (six) hours as needed for flatulence.    . diphenoxylate-atropine (LOMOTIL) 2.5-0.025 MG tablet Take 1 tablet by mouth 4 (four) times daily. 90 tablet 0  . oxyCODONE (ROXICODONE) 15 MG immediate release tablet Take 1 tablet (15 mg total) by mouth every 6 (six) hours as needed for pain. 40 tablet 0   No facility-administered medications prior to visit.      Review of Systems  Constitutional: Positive for activity change.  HENT: Negative.   Eyes: Negative.   Respiratory: Negative for cough, shortness of breath and wheezing.   Cardiovascular: Negative for palpitations and leg swelling.  Gastrointestinal: Positive for diarrhea. Negative for abdominal distention, anal bleeding, blood in stool, constipation, nausea, rectal pain and vomiting.  Endocrine: Negative.   Genitourinary: Negative.   Musculoskeletal: Positive for arthralgias, back pain, gait problem and joint swelling.  Neurological: Positive for dizziness and weakness.  Psychiatric/Behavioral: Positive for sleep disturbance. Negative for agitation, behavioral problems, decreased concentration, dysphoric mood, self-injury and suicidal ideas. The patient is not  nervous/anxious and is not hyperactive.        Objective:   Physical Exam  There were no vitals filed for this visit.  No exam this is a video visit  BMP  Latest Ref Rng & Units 12/22/2019 12/20/2019 12/19/2019  Glucose 70 - 99 mg/dL 164(H) 116(H) 107(H)  BUN 6 - 20 mg/dL 6 14 20   Creatinine 0.61 - 1.24 mg/dL 1.64(H) 1.93(H) 2.46(H)  BUN/Creat Ratio 9 - 20 - - -  Sodium 135 - 145 mmol/L 138 135 137  Potassium 3.5 - 5.1 mmol/L 3.4(L) 3.6 3.5  Chloride 98 - 111 mmol/L 100 101 100  CO2 22 - 32 mmol/L 26 23 27   Calcium 8.9 - 10.3 mg/dL 9.1 8.2(L) 8.4(L)   CBC Latest Ref Rng & Units 12/22/2019 12/20/2019 12/19/2019  WBC 4.0 - 10.5 K/uL 6.8 2.6(L) 2.8(L)  Hemoglobin 13.0 - 17.0 g/dL 12.0(L) 9.7(L) 10.0(L)  Hematocrit 39 - 52 % 36.4(L) 29.7(L) 30.2(L)  Platelets 150 - 400 K/uL 156 80(L) 80(L)   Hepatic Function Latest Ref Rng & Units 12/22/2019 12/17/2019 12/16/2019  Total Protein 6.5 - 8.1 g/dL 7.2 7.1 7.9  Albumin 3.5 - 5.0 g/dL 3.3(L) 3.1(L) 3.5  AST 15 - 41 U/L 43(H) 32 38  ALT 0 - 44 U/L 28 31 37  Alk Phosphatase 38 - 126 U/L 109 86 91  Total Bilirubin 0.3 - 1.2 mg/dL 0.6 1.0 1.0  Bilirubin, Direct 0.00 - 0.40 mg/dL - - -       Assessment & Plan:  I personally reviewed all images and lab data in the [facility] system as well as any outside material available during this office visit and agree with the  radiology impressions.   Nausea and vomiting Nausea vomiting secondary to electrolyte disturbances related to high output ostomy secondary to lack of use of Lomotil and acute weight loss diet with a liquid based diet without fiber  We will have the patient come to the office for follow-up lab studies  Lumbar radiculopathy Severe lumbar radiculopathy which is due to lumbar disc disease and spondylosis impinging nerve roots on the left and right spine right greater than left at this time.  The foot drop predates the patient's weakness nausea and vomiting and is likely related to the radiculopathy and  spinal disease.  It is imperative this patient see a spine surgeon so this can be now be addressed.  Without that the patient will continue to be more and more disabled and not able to work.  The injury appeared to occur when he was on the job slipping and falling.  I did prescribe today gabapentin 3 times daily 300 mg and will continue Cymbalta 60 mg daily we will refill the Roxicodone at 15 mg every 4 hours as needed  The narcotics drug database was recently assessed  Protrusion of lumbar intervertebral disc Protrusion of lumbar disc resulting in severe pain  Acute renal failure (HCC) Acute renal failure secondary to volume depletion from high output ostomy and adverse diet now resolved  Abscess or cellulitis of foot No abscess observed on today's exam  Hypokalemia Hypokalemia we will recheck metabolic profile  Right foot drop Right foot drop on the basis of lumbar disease in the lower spine right worse than left  Homelessness Patient tells me he is currently run out of money to pay for any more of his hotel rooms  I did connect him with our nurse case manager to see if partners and homelessness or serving house could be is of assistance finding him housing   Diagnoses and all orders for this visit:  Bilious vomiting with nausea  Lumbar radiculopathy  Protrusion of lumbar intervertebral disc  Acute renal failure, unspecified acute renal  failure type ([place])  Abscess or cellulitis of foot  Hypokalemia  Right foot drop  Homelessness  Other orders -     gabapentin (NEURONTIN) 300 MG capsule; Take 1 capsule (300 mg total) by mouth 3 (three) times daily. -     oxyCODONE (ROXICODONE) 15 MG immediate release tablet; Take 1 tablet (15 mg total) by mouth every 6 (six) hours as needed for pain. -     diphenoxylate-atropine (LOMOTIL) 2.5-0.025 MG tablet; Take 1 tablet by mouth 4 (four) times daily.   Follow Up Instructions: Follow-up exam in the next 2 weeks will be  obtained   I discussed the assessment and treatment plan with the patient. The patient was provided an opportunity to ask questions and all were answered. The patient agreed with the plan and demonstrated an understanding of the instructions.   The patient was advised to call back or seek an in-person evaluation if the symptoms worsen or if the condition fails to improve as anticipated.  I provided 30 minutes of non-face-to-face time during this encounter  including  median intraservice time , review of notes, labs, imaging, medications  and explaining diagnosis and management to the patient .    Asencion Noble, MD

## 2019-12-27 NOTE — Assessment & Plan Note (Signed)
Acute renal failure secondary to volume depletion from high output ostomy and adverse diet now resolved

## 2019-12-27 NOTE — Assessment & Plan Note (Signed)
Nausea vomiting secondary to electrolyte disturbances related to high output ostomy secondary to lack of use of Lomotil and acute weight loss diet with a liquid based diet without fiber  We will have the patient come to the office for follow-up lab studies

## 2019-12-27 NOTE — Assessment & Plan Note (Signed)
Severe lumbar radiculopathy which is due to lumbar disc disease and spondylosis impinging nerve roots on the left and right spine right greater than left at this time.  The foot drop predates the patient's weakness nausea and vomiting and is likely related to the radiculopathy and spinal disease.  It is imperative this patient see a spine surgeon so this can be now be addressed.  Without that the patient will continue to be more and more disabled and not able to work.  The injury appeared to occur when he was on the job slipping and falling.  I did prescribe today gabapentin 3 times daily 300 mg and will continue Cymbalta 60 mg daily we will refill the Roxicodone at 15 mg every 4 hours as needed  The narcotics drug database was recently assessed

## 2019-12-28 NOTE — Telephone Encounter (Signed)
Call returned to patient at request of Dr Joya Gaskins.  Patient currently staying in a motel and is having difficulty affording it.  When this CM offered the phone number for [employer] [employer] [facility], he said that he already has the phone number and left a message for Walker.   Regarding disability, he said that he is currently working with an attorney and began the application process in February 2021.  He did not report any other needs at this time

## 2019-12-29 ENCOUNTER — Telehealth (INDEPENDENT_AMBULATORY_CARE_PROVIDER_SITE_OTHER): Payer: No Payment, Other | Admitting: Psychiatry

## 2019-12-29 ENCOUNTER — Other Ambulatory Visit (HOSPITAL_COMMUNITY): Payer: Self-pay | Admitting: Critical Care Medicine

## 2019-12-29 ENCOUNTER — Other Ambulatory Visit: Payer: Self-pay

## 2019-12-29 ENCOUNTER — Other Ambulatory Visit: Payer: Self-pay | Admitting: Critical Care Medicine

## 2019-12-29 DIAGNOSIS — F411 Generalized anxiety disorder: Secondary | ICD-10-CM

## 2019-12-29 DIAGNOSIS — F3131 Bipolar disorder, current episode depressed, mild: Secondary | ICD-10-CM | POA: Diagnosis not present

## 2019-12-29 MED ORDER — GABAPENTIN 300 MG PO CAPS: 300.0000 mg | ORAL_CAPSULE | Freq: Three times a day (TID) | ORAL | 2 refills | Status: DC

## 2019-12-29 MED ORDER — DIVALPROEX SODIUM 500 MG PO DR TAB: 500.0000 mg | DELAYED_RELEASE_TABLET | Freq: Two times a day (BID) | ORAL | 2 refills | Status: DC

## 2019-12-29 MED ORDER — DULOXETINE HCL 60 MG PO CPEP: 60.0000 mg | ORAL_CAPSULE | Freq: Every day | ORAL | 2 refills | Status: DC

## 2019-12-29 MED ORDER — CLONAZEPAM 0.5 MG PO TABS: 0.2500 mg | ORAL_TABLET | Freq: Two times a day (BID) | ORAL | 2 refills | Status: DC

## 2019-12-29 MED ORDER — QUETIAPINE FUMARATE 300 MG PO TABS: 300.0000 mg | ORAL_TABLET | Freq: Every day | ORAL | 2 refills | Status: DC

## 2019-12-29 MED ORDER — QUETIAPINE FUMARATE 50 MG PO TABS: 50.0000 mg | ORAL_TABLET | Freq: Two times a day (BID) | ORAL | 2 refills | Status: DC

## 2019-12-29 NOTE — Progress Notes (Signed)
[facility] MD/PA/NP OP Progress Note Virtual Visit via Video Note  I connected with Juan Hester on 12/29/19 at  3:00 PM EDT by a video enabled telemedicine application and verified that I am speaking with the correct person using two identifiers.  Location: Patient: Home Provider: Clinic   I discussed the limitations of evaluation and management by telemedicine and the availability of in person appointments. The patient expressed understanding and agreed to proceed.  I provided 30 minutes of non-face-to-face time during this encounter.     12/29/2019 4:14 PM Juan Hester  MRN:  [id number]  Chief Complaint:  " My depression and mood has improved but I am still having panic attacks"  HPI: 39 year old male seen today for follow up psychiatric evaluation.  He has a psychiatric history of bipolar 1 disorder, bipolar affective disorder, PTSD, schizoaffective disorder, anxiety, depression, and suicidal ideation.  He is currently being managed on Celexa 60 mg daily, hydroxyzine 25 mg 3 times daily, Seroquel 300 mg at bedtime, Seroquel 50 mg BID, and Depakote 500 mg twice daily.  Today the patient notes that his depression and psychosis have improved he however notes that he continues to have panic attacks.  During exam patient is pleasant, calm, cooperative, engaged in conversation, and maintains eye contact.  He notes that he is seeing an improvement in his mood, depression, and VAH.  He notes that he is less anxious than he was prior to starting the Seroquel however notes that if he continues to have panic attacks at work and at home.  Patient reports that increased life stressors exacerbate his anxiety.  He notes that he was recently hospitalized  for a pulmonary embolism.  He also notes that he has a slipped disc and dropfoot which is requiring him to ambulate with the use of a cane.  He notes that his ex-wife and her new boyfriend frequently listen in on calls that he has with his children.  He  informed Probation officer that  he recently lost his job and may be homeless in 2 weeks. He is also on probation and notes that he is applying to return to [place] to get away from life stressors.  He notes that his mother is not supportive and treats him poorly at times.  He notes that the above stressors are becoming overwhelming.  He notes that he obsessively thinks about the stressors which leads to him having a panic attack.  Patient notes that he still sees an outpatient counselor to help him manage stressors.  He reports that his therapist is somewhat helpful in helping him manage his anxiety.   Patient notes that he would like to take one of his 50 mg Seroquel at night to improve his sleep. Provider informed patient that that was okay.  He is agreeable to taking Seroquel 50 mg once daily and Seroquel 350 mg at night.  He is also agreeable to starting Klonopin 0.25 mg twice daily as needed for anxiety.  Writer informed patient that the dose potentially will not be increased and encouraged patient to follow-up with therapist to help manage symptoms as well as take medications as prescribed.  He endorsed understanding and agreed.  He will continue all other medications as prescribed.  No other concerns noted at this time.    Visit Diagnosis:    ICD-10-CM   1. Bipolar affective disorder, currently depressed, mild (HCC)  F31.31 QUEtiapine (SEROQUEL) 50 MG tablet    QUEtiapine (SEROQUEL) 300 MG tablet    DULoxetine (CYMBALTA) 60  MG capsule    divalproex (DEPAKOTE) 500 MG DR tablet  2. GAD (generalized anxiety disorder)  F41.1 QUEtiapine (SEROQUEL) 50 MG tablet    DULoxetine (CYMBALTA) 60 MG capsule    Past Psychiatric History:  bipolar 1 disorder, bipolar affective disorder, PTSD, schizoaffective disorder, anxiety, depression, and suicidal ideation.   Past Medical History:  Past Medical History:  Diagnosis Date  . Acute renal failure ([place]) 12/16/2019  . Colon cancer ([place])   . Crohn disease ([place])   .  PE (pulmonary thromboembolism) ([place])   . Thyroid disease     Past Surgical History:  Procedure Laterality Date  . ABDOMINAL SURGERY    . COLON SURGERY    . IR RADIOLOGIST EVAL & MGMT  08/31/2019  . OSTOMY    . SMALL INTESTINE SURGERY      Family Psychiatric History: unknown  Family History: No family history on file.  Social History:  Social History   Socioeconomic History  . Marital status: Divorced    Spouse name: Not on file  . Number of children: Not on file  . Years of education: Not on file  . Highest education level: Not on file  Occupational History  . Not on file  Tobacco Use  . Smoking status: Current Every Day Smoker    Packs/day: 1.00  . Smokeless tobacco: Never Used  Vaping Use  . Vaping Use: Never used  Substance and Sexual Activity  . Alcohol use: Yes  . Drug use: Not Currently  . Sexual activity: Yes  Other Topics Concern  . Not on file  Social History Narrative  . Not on file   Social Determinants of Health   Financial Resource Strain:   . Difficulty of Paying Living Expenses: Not on file  Food Insecurity:   . Worried About Charity fundraiser in the Last Year: Not on file  . Ran Out of Food in the Last Year: Not on file  Transportation Needs:   . Lack of Transportation (Medical): Not on file  . Lack of Transportation (Non-Medical): Not on file  Physical Activity:   . Days of Exercise per Week: Not on file  . Minutes of Exercise per Session: Not on file  Stress:   . Feeling of Stress : Not on file  Social Connections:   . Frequency of Communication with Friends and Family: Not on file  . Frequency of Social Gatherings with Friends and Family: Not on file  . Attends Religious Services: Not on file  . Active Member of Clubs or Organizations: Not on file  . Attends Archivist Meetings: Not on file  . Marital Status: Not on file    Allergies:  Allergies  Allergen Reactions  . Azathioprine Other (See Comments)    Pt does not  recall reaction    . Ciprofloxacin Other (See Comments)    Pt does not recall reaction    . Mesalamine Other (See Comments)    Pt does not recall reaction  . Metronidazole Other (See Comments)    Pt does not recall reaction   . Sulfa Antibiotics Other (See Comments)    Pt does not recall reaction   . Barium Sulfate Rash  . Barium-Containing Compounds Rash    Metabolic Disorder Labs: Lab Results  Component Value Date   HGBA1C 5.0 05/05/2019   No results found for: PROLACTIN Lab Results  Component Value Date   CHOL 155 05/05/2019   TRIG 202 (H) 05/05/2019   HDL 55 05/05/2019  CHOLHDL 2.8 05/05/2019   LDLCALC 67 05/05/2019   Lab Results  Component Value Date   TSH 0.659 08/18/2019   TSH 0.493 05/05/2019    Therapeutic Level Labs: No results found for: LITHIUM No results found for: VALPROATE No components found for:  CBMZ  Current Medications: Current Outpatient Medications  Medication Sig Dispense Refill  . acetaminophen (TYLENOL) 325 MG tablet Take 2 tablets (650 mg total) by mouth every 6 (six) hours as needed for mild pain (or Fever >/= 101).    . clonazePAM (KLONOPIN) 0.5 MG tablet Take 0.5 tablets (0.25 mg total) by mouth 2 (two) times daily. 60 tablet 2  . diphenoxylate-atropine (LOMOTIL) 2.5-0.025 MG tablet Take 1 tablet by mouth 4 (four) times daily. 90 tablet 0  . divalproex (DEPAKOTE) 500 MG DR tablet Take 1 tablet (500 mg total) by mouth 2 (two) times daily. 60 tablet 2  . DULoxetine (CYMBALTA) 60 MG capsule Take 1 capsule (60 mg total) by mouth daily. 30 capsule 2  . gabapentin (NEURONTIN) 300 MG capsule Take 1 capsule (300 mg total) by mouth 3 (three) times daily. 90 capsule 2  . naloxone (NARCAN) nasal spray 4 mg/0.1 mL Use as needed for overdose symptoms from opiate 1 each 0  . oxyCODONE (ROXICODONE) 15 MG immediate release tablet Take 1 tablet (15 mg total) by mouth every 6 (six) hours as needed for pain. 40 tablet 0  . QUEtiapine (SEROQUEL) 300 MG  tablet Take 1 tablet (300 mg total) by mouth at bedtime. 30 tablet 2  . QUEtiapine (SEROQUEL) 50 MG tablet Take 1 tablet (50 mg total) by mouth 2 (two) times daily. 60 tablet 2  . rivaroxaban (XARELTO) 20 MG TABS tablet Take 1 tablet (20 mg total) by mouth daily with supper. 30 tablet 4  . simethicone (MYLICON) 80 MG chewable tablet Chew 80 mg by mouth every 6 (six) hours as needed for flatulence.     No current facility-administered medications for this visit.     Musculoskeletal: Strength & Muscle Tone: Unable to assess due to telehealth visit [place]: Unable to assess due to telehealth visit Patient leans: N/A  Psychiatric Specialty Exam: Review of Systems  There were no vitals taken for this visit.There is no height or weight on file to calculate BMI.  General Appearance: Well Groomed  Eye Contact:  Good  Speech:  Clear and Coherent and Normal Rate  Volume:  Normal  Mood:  Anxious  Affect:  Congruent  Thought Process:  Coherent, Goal Directed and Linear  Orientation:  Full (Time, Place, and Person)  Thought Content: WDL and Logical   Suicidal Thoughts:  No  Homicidal Thoughts:  No  Memory:  Immediate;   Good Recent;   Good Remote;   Good  Judgement:  Good  Insight:  Good  Psychomotor Activity:  Normal  Concentration:  Concentration: Good and Attention Span: Good  Recall:  Good  Fund of Knowledge: Good  Language: Good  Akathisia:  No  Handed:  Right  AIMS (if indicated): Not done  Assets:  Communication Skills Desire for Improvement Housing Leisure Time  ADL's:  Intact  Cognition: WNL  Sleep:  Fair   Screenings: GAD-7     Office Visit from 12/15/2019 in [facility] Office Visit from 09/01/2019 in [facility] Visit from 08/18/2019 in [facility] Office Visit from 07/21/2019 in [facility] Office Visit from 06/02/2019  in [facility]  Total GAD-7 Score 19 21 16 14 12     PHQ2-9     Office Visit from 12/15/2019 in [facility] Office Visit from 09/01/2019 in [facility] Office Visit from 08/18/2019 in [facility] Office Visit from 07/21/2019 in [facility] Office Visit from 06/02/2019 in [facility]  PHQ-2 Total Score 1 4 2 2 1   PHQ-9 Total Score 3 15 9 9 10        Assessment and Plan: Patient endorses symptoms of anxiety however notes that symptoms of psychosis and depression have improved.  He is agreeable to taking Seroquel 50 mg once daily and Seroquel 350 mg at night.  He is also agreeable to starting Klonopin 0.25 mg twice daily as needed for anxiety.  Writer informed patient that the dose potentially will not be increased and encouraged patient to follow-up with therapist to help manage symptoms as well as take medications as prescribed.  He endorsed understanding and agreed.  He will continue all other medications as prescribed.  Patient received his gabapentin from his PCP for neuropathy.  1. Bipolar affective disorder, currently depressed, mild (HCC) Continue- QUEtiapine (SEROQUEL) 50 MG tablet; Take 1 tablet (50 mg total) by mouth 2 (two) times daily.  Dispense: 60 tablet; Refill: 2 Continue- QUEtiapine (SEROQUEL) 300 MG tablet; Take 1 tablet (300 mg total) by mouth at bedtime.  Dispense: 30 tablet; Refill: 2 Continue- DULoxetine (CYMBALTA) 60 MG capsule; Take 1 capsule (60 mg total) by mouth daily.  Dispense: 30 capsule; Refill: 2 Continue- divalproex (DEPAKOTE) 500 MG DR tablet; Take 1 tablet (500 mg total) by mouth 2 (two) times daily.  Dispense: 60 tablet; Refill: 2  2. GAD (generalized anxiety disorder)  Continue- QUEtiapine (SEROQUEL) 50 MG tablet; Take 1 tablet (50 mg total) by mouth 2 (two) times daily.  Dispense: 60 tablet; Refill:  2 Continue- DULoxetine (CYMBALTA) 60 MG capsule; Take 1 capsule (60 mg total) by mouth daily.  Dispense: 30 capsule; Refill: 2 Start- clonazePAM (KLONOPIN) 0.5 MG tablet; Take 0.5 tablets (0.25 mg total) by mouth 2 (two) times daily.  Dispense: 60 tablet; Refill: 2  Follow-up in 1 month Follow-up with therapy   Salley Slaughter, NP 12/29/2019, 4:14 PM

## 2019-12-29 NOTE — Progress Notes (Signed)
refills  

## 2019-12-30 MED FILL — QUETIAPINE FUMARATE 300 MG: 300 | 30 days supply | Qty: 30 | Fill #0

## 2019-12-30 MED FILL — QUETIAPINE FUMARATE 50 MG T: 50 | 30 days supply | Qty: 60 | Fill #0

## 2019-12-30 MED FILL — DIVALPROEX SOD DR 500 MG TA: 500 | 30 days supply | Qty: 60 | Fill #0

## 2019-12-30 MED FILL — clonazePAM 0.5 MG TABS: 0.5 | 30 days supply | Qty: 30 | Fill #0

## 2020-01-02 ENCOUNTER — Emergency Department (HOSPITAL_COMMUNITY)
Admission: EM | Admit: 2020-01-02 | Discharge: 2020-01-02 | Disposition: A | Discharge status: Home / Self Care | Payer: Self-pay | Attending: Emergency Medicine | Admitting: Emergency Medicine

## 2020-01-02 ENCOUNTER — Emergency Department (HOSPITAL_COMMUNITY): Payer: Self-pay

## 2020-01-02 ENCOUNTER — Encounter (HOSPITAL_COMMUNITY): Payer: Self-pay

## 2020-01-02 DIAGNOSIS — F17200 Nicotine dependence, unspecified, uncomplicated: Secondary | ICD-10-CM | POA: Insufficient documentation

## 2020-01-02 DIAGNOSIS — M79671 Pain in right foot: Secondary | ICD-10-CM | POA: Insufficient documentation

## 2020-01-02 DIAGNOSIS — C189 Malignant neoplasm of colon, unspecified: Secondary | ICD-10-CM | POA: Insufficient documentation

## 2020-01-02 MED ORDER — MORPHINE SULFATE (PF) 4 MG/ML IV SOLN
4.0000 mg | Freq: Once | INTRAVENOUS | Status: AC
  Administered 2020-01-02: 4 mg via INTRAMUSCULAR
  Filled 2020-01-02: qty 1

## 2020-01-02 MED ORDER — OXYCODONE-ACETAMINOPHEN 5-325 MG PO TABS
1.0000 | ORAL_TABLET | Freq: Once | ORAL | Status: AC
  Administered 2020-01-02: 1 via ORAL
  Filled 2020-01-02: qty 1

## 2020-01-02 MED ORDER — OXYCODONE-ACETAMINOPHEN 5-325 MG PO TABS
2.0000 | ORAL_TABLET | Freq: Once | ORAL | Status: DC
  Filled 2020-01-02: qty 2

## 2020-01-02 MED ORDER — ONDANSETRON 4 MG PO TBDP
4.0000 mg | ORAL_TABLET | Freq: Once | ORAL | Status: AC
  Administered 2020-01-02: 4 mg via ORAL
  Filled 2020-01-02: qty 1

## 2020-01-02 MED ORDER — CYCLOBENZAPRINE HCL 10 MG PO TABS: 10.0000 mg | ORAL_TABLET | Freq: Two times a day (BID) | ORAL | 0 refills | Status: DC | PRN

## 2020-01-02 NOTE — Discharge Instructions (Addendum)
Please call and follow-up closely with your primary care provider for further managements of your foot pain.  You may take Flexeril as needed.

## 2020-01-02 NOTE — ED Provider Notes (Signed)
[facility] Provider Note   CSN: [id number] Arrival date & time: 01/02/20  0020     History Chief Complaint  Patient presents with  . Foot Pain    Juan Hester is a 39 y.o. male.  The history is provided by the patient and medical records. No language interpreter was used.  Foot Pain     39 year old male significant history of Crohn's disease, colon cancer, bipolar, schizophrenia, homelessness, recurrent lumbar radiculopathy presenting complaining of right foot pain.  Patient states for the past several weeks he has had recurrent lower back pain radiates down to his right leg and for the past 2 3 days he has had progressive worsening pain to his right foot.  He described pain as a burning sensation, persistent, giving him difficulty ambulation.  States he has to use a cane to walk.  Pain started in his knee radiates to the foot and he also noticed that his foot has been dropping for the past several weeks.  He has been taking gabapentin as well as oxycodone.  States gabapentin did not provide much of her relief.  States oxycodone was helping but he has ran out of it.  He currently complains of 10 out of 10 pain.  He denies any bowel bladder incontinence or saddle anesthesia.  No fever or chills.  Past Medical History:  Diagnosis Date  . Acute renal failure ([place]) 12/16/2019  . Colon cancer ([place])   . Crohn disease ([place])   . PE (pulmonary thromboembolism) ([place])   . Thyroid disease     Patient Active Problem List   Diagnosis Date Noted  . Homelessness 12/27/2019  . Hypokalemia 12/16/2019  . Colostomy status ([place]) 12/16/2019  . History of pulmonary embolism 12/16/2019  . Nausea and vomiting 12/16/2019  . Right foot drop 12/15/2019  . GAD (generalized anxiety disorder) 10/20/2019  . MDD (major depressive disorder) 10/20/2019  . PTSD (post-traumatic stress disorder) 10/20/2019  . Lumbar radiculopathy 10/19/2019  . Bipolar affective disorder,  currently depressed, mild ([place]) 10/16/2019  . ETOH abuse 10/16/2019  . Protrusion of lumbar intervertebral disc 10/05/2019  . Multiple subsegmental pulmonary emboli without acute cor pulmonale ([place]) 07/21/2019  . Bipolar 1 disorder ([place]) 07/21/2019  . History of colon cancer 07/21/2019  . Schizoaffective disorder, bipolar type ([place]) 06/03/2019  . Nicotine dependence, cigarettes, uncomplicated 83/12/4074  . Crohn's disease with complication ([place]) 80/88/1103    Past Surgical History:  Procedure Laterality Date  . ABDOMINAL SURGERY    . COLON SURGERY    . IR RADIOLOGIST EVAL & MGMT  08/31/2019  . OSTOMY    . SMALL INTESTINE SURGERY         No family history on file.  Social History   Tobacco Use  . Smoking status: Current Every Day Smoker    Packs/day: 1.00  . Smokeless tobacco: Never Used  Vaping Use  . Vaping Use: Never used  Substance Use Topics  . Alcohol use: Yes  . Drug use: Not Currently    Home Medications Prior to Admission medications   Medication Sig Start Date End Date Taking? Authorizing Provider  acetaminophen (TYLENOL) 325 MG tablet Take 2 tablets (650 mg total) by mouth every 6 (six) hours as needed for mild pain (or Fever >/= 101). 12/20/19   Domenic Polite, MD  clonazePAM (KLONOPIN) 0.5 MG tablet Take 0.5 tablets (0.25 mg total) by mouth 2 (two) times daily. 12/29/19   [name], MD  diphenoxylate-atropine (LOMOTIL) 2.5-0.025 MG  tablet Take 1 tablet by mouth 4 (four) times daily. 12/27/19   [name], MD  divalproex (DEPAKOTE) 500 MG DR tablet Take 1 tablet (500 mg total) by mouth 2 (two) times daily. 12/29/19   [name], MD  DULoxetine (CYMBALTA) 60 MG capsule Take 1 capsule (60 mg total) by mouth daily. 12/29/19   [name], MD  gabapentin (NEURONTIN) 300 MG capsule Take 1 capsule (300 mg total) by mouth 3 (three) times daily. 12/29/19   [name], MD  naloxone [facility]) nasal spray 4 mg/0.1 mL Use as needed for overdose  symptoms from opiate 10/19/19   [name], MD  oxyCODONE (ROXICODONE) 15 MG immediate release tablet Take 1 tablet (15 mg total) by mouth every 6 (six) hours as needed for pain. 12/27/19   [name], MD  QUEtiapine (SEROQUEL) 300 MG tablet Take 1 tablet (300 mg total) by mouth at bedtime. 12/29/19   [name], MD  QUEtiapine (SEROQUEL) 50 MG tablet Take 1 tablet (50 mg total) by mouth 2 (two) times daily. 12/29/19   [name], MD  rivaroxaban (XARELTO) 20 MG TABS tablet Take 1 tablet (20 mg total) by mouth daily with supper. 12/15/19   [name], MD  simethicone (MYLICON) 80 MG chewable tablet Chew 80 mg by mouth every 6 (six) hours as needed for flatulence.    [provider]    Allergies    Azathioprine, Ciprofloxacin, Mesalamine, Metronidazole, Sulfa antibiotics, Barium sulfate, and Barium-containing compounds  Review of Systems   Review of Systems  All other systems reviewed and are negative.   Physical Exam Updated Vital Signs BP (!) 126/108   Pulse 96   Temp 98.5 F (36.9 C)   Resp 20   SpO2 99%   Physical Exam Vitals and nursing note reviewed.  Constitutional:      Appearance: He is well-developed.     Comments: Patient appears uncomfortable,  HENT:     Head: Atraumatic.  Eyes:     Conjunctiva/sclera: Conjunctivae normal.  Musculoskeletal:        General: Tenderness (Exquisite tenderness to palpation of the right foot and ankle.  Ruptured blister noted to the dorsum of the foot the size of a quarter.  Dorsalis pedis pulse palpable.  Decreased ankle range of motion secondary to pain and difficulty moving.) present.     Cervical back: Neck supple.     Comments: No significant midline spine tenderness.  Negative straight leg raise.  Skin:    Capillary Refill: Capillary refill takes less than 2 seconds.     Findings: No rash.  Neurological:     Mental Status: He is alert.     Comments: Patellar deep tendon reflex intact  bilaterally.  Slight foot drop noted right foot initially however when ambulation no obvious foot drop.     ED Results / Procedures / Treatments   Labs (all labs ordered are listed, but only abnormal results are displayed) Labs Reviewed - No data to display  EKG None  Radiology DG Foot Complete Right  Result Date: 01/02/2020 CLINICAL DATA:  Left foot pain. EXAM: RIGHT FOOT COMPLETE - 3+ VIEW COMPARISON:  None. FINDINGS: No fracture or dislocation. Joint spaces are preserved. No significant hallux valgus deformity. No erosions. No significant plantar calcaneal spur. Regional soft tissues appear normal. No radiopaque foreign body. IMPRESSION: No explanation for patient's left foot pain. Electronically Signed   By: Sandi Mariscal M.D.   On: 01/02/2020  09:38    Procedures Procedures (including critical care time)  Medications Ordered in ED Medications  morphine 4 MG/ML injection 4 mg (has no administration in time range)  oxyCODONE-acetaminophen (PERCOCET/ROXICET) 5-325 MG per tablet 1 tablet (1 tablet Oral Given 01/02/20 0029)  ondansetron (ZOFRAN-ODT) disintegrating tablet 4 mg (4 mg Oral Given 01/02/20 0034)    ED Course  I have reviewed the triage vital signs and the nursing notes.  Pertinent labs & imaging results that were available during my care of the patient were reviewed by me and considered in my medical decision making (see chart for details).    MDM Rules/Calculators/A&P                          BP (!) 126/108   Pulse 96   Temp 98.5 F (36.9 C)   Resp 20   SpO2 99%   Final Clinical Impression(s) / ED Diagnoses Final diagnoses:  Foot pain, right    Rx / DC Orders ED Discharge Orders         Ordered    cyclobenzaprine (FLEXERIL) 10 MG tablet  2 times daily PRN        01/02/20 0959         9:10 AM Patient complaining of foot drop to the right foot as well as exquisite burning sensation to his foot.  Mention history of slipped disks, and lumbar  radiculopathy.  He report opiate pain medication did help his pain but he ran out of pain medication.  He does not have any complaints of bowel bladder incontinence or saddle anesthesia to suggest cord compression.  On exam he provide minimal movement to the foot however when ambulation, no obvious foot drop noted.  Patient ambulates with a cane.  His symptoms is likely due to lumbar radiculopathy.  Plan to prescribe prednisone, and muscle relaxant along with encouragement of using gabapentin.  9:31 AM I have reviewed pt's drug database.  He received 40 tablets of oxycodone 81m on 9/14.  In the patient's best interests I do not think additional opiate medication is indicated.  I encourage patient to follow-up with his PCP for further managements of his recurrent foot pain and radicular pain.  Will prescribe short course of muscle relaxant.   TDomenic Moras PA-C 01/02/20 1000    PCharlesetta Shanks MD 01/02/20 1058

## 2020-01-02 NOTE — ED Notes (Signed)
No answer in lobby.

## 2020-01-02 NOTE — ED Notes (Signed)
Pt states he doesn't want to take Oxycodone with Tylenol.  States the last time he took it the pill did not digest and "came out back side whole."  Spoke with [place], PA that said to give the patient the option to have Toradol instead.  Updated pt with option for Percocet or Toradol and pt became very upset and requesting a different Dr.  Abbott Pao declined Percocet and Toradol.

## 2020-01-02 NOTE — ED Triage Notes (Signed)
Pt arrives to ED w/ c/o 10/10 L foot pain. Pt states pain is radiating down from knee to foot, worse in knee. Pt diagnosed w/ drop foot 3 weeks, states symptoms started 5 weeks ago. Pt has had spinal imaging L4-L5 slipped disc. EMS VSS.

## 2020-01-02 NOTE — ED Notes (Signed)
Reviewed discharge instructions with patient. Follow-up care and medications reviewed. Patient verbalized understanding. Patient A&Ox4, VSS, and ambulatory with cane upon discharge. Pt to wait in lobby per charge RN to wait for ostomy supplies to arrive. Pt verbalized understanding.

## 2020-01-02 NOTE — ED Notes (Signed)
Pt requesting 3 bus passes and colostomy supplies.  Case manager notified.

## 2020-01-02 NOTE — Care Management (Signed)
Patient requesting help with ostomy supplies, has [employer], but they cannot provide wafers for another week at least. Patient also requesting 3 bus passes to get him home. Provided two bus passes and  2 wafers 2 3/4 for patient.[employer] number provided in DC instructions, however he has already been in touch with them and attempting to be proactive and following up.

## 2020-01-06 ENCOUNTER — Encounter: Payer: Self-pay | Admitting: Physical Therapy

## 2020-01-06 ENCOUNTER — Other Ambulatory Visit: Payer: Self-pay

## 2020-01-06 ENCOUNTER — Ambulatory Visit: Payer: Self-pay | Attending: Internal Medicine | Admitting: Physical Therapy

## 2020-01-06 DIAGNOSIS — M5441 Lumbago with sciatica, right side: Secondary | ICD-10-CM | POA: Insufficient documentation

## 2020-01-06 DIAGNOSIS — G8929 Other chronic pain: Secondary | ICD-10-CM | POA: Insufficient documentation

## 2020-01-06 DIAGNOSIS — R293 Abnormal posture: Secondary | ICD-10-CM | POA: Insufficient documentation

## 2020-01-06 NOTE — Therapy (Signed)
[place], [place], [address] Phone: [phone]   Fax:  [phone]  Physical Therapy Evaluation  Patient Details  Name: Juan Hester MRN: [id number] Date of Birth: 1980-11-10 Referring Provider (PT):  Domenic Polite, MD    Encounter Date: 01/06/2020   PT End of Session - 01/06/20 1113    Visit Number 1    Number of Visits 7    Date for PT Re-Evaluation 03/02/20    Authorization Type self-pay - MCD potential    PT Start Time 1115   pt checked in 10 min late then had to use the restroom   PT Stop Time 1145    PT Time Calculation (min) 30 min    Activity Tolerance Patient limited by pain           Past Medical History:  Diagnosis Date   Acute renal failure ([place]) 12/16/2019   Anxiety    per pt report   Colon cancer ([place])    Crohn disease ([place])    Depression    per pt report   Foot drop, right    Myocardial infarction ([place])    PE (pulmonary thromboembolism) ([place])    PTSD (post-traumatic stress disorder)    Thyroid disease     Past Surgical History:  Procedure Laterality Date   ABDOMINAL SURGERY     COLON SURGERY     IR RADIOLOGIST EVAL & MGMT  08/31/2019   OSTOMY     SMALL INTESTINE SURGERY      There were no vitals filed for this visit.    Subjective Assessment - 01/06/20 1119    Subjective pt is a 39 y.o M with CC of low back pain that started 4/42021 while he was at work. He reports he slipped and feel into puddle of water landing on his back. He reports having LLE N/T that last for 3-4  weeks after the injury and had resolved. about 6 weeks ago he reports the R foot wouldn't work and dropped which caused him to fall which he reports the RLE N/T started after the fall which has worsened since onset. He denies any saddle paresthesia or incontenince.    Limitations Standing;Walking    How long can you sit comfortably? 10 min    How long can you stand comfortably? 10 min  with [facility]    How long can you walk comfortably? 15 min with [facility]    Diagnostic tests 09/15/2019 IMPRESSION:1. 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    Patient Stated Goals to decrease pain    Currently in Pain? Yes    Pain Score 9    pt reported 10 with walking but declined going to ED   Pain Location Back    Pain Orientation Right    Pain Descriptors / Indicators Aching;Burning;Numbness    Pain Type Chronic pain    Pain Radiating Towards RLE pain    Pain Onset More than a month ago    Pain Frequency Constant    Aggravating Factors  standing/ walking              [facility] PT Assessment - 01/06/20 0001      Assessment   Medical Diagnosis Protrusion of lumbar intervertebral disc M51.26    Referring Provider (PT)  Domenic Polite, MD     Onset Date/Surgical Date --   inital injry on  07/18/2019, RLE pain 6 weeks ago   Hand Dominance Right    Next MD Visit unsure    Prior Therapy PT in hospital      Precautions   Precautions None      Restrictions   Weight Bearing Restrictions No      Balance Screen   Has the patient fallen in the past 6 months Yes    How many times? 1    Has the patient had a decrease in activity level because of a fear of falling?  No    Is the patient reluctant to leave their home because of a fear of falling?  No      Home Ecologist residence      Prior Function   Level of Independence Requires assistive device for independence    Vocation --   applying for disability     Cognition   Overall Cognitive Status Within Functional Limits for tasks assessed      Observation/Other Assessments   Observations difficulty maintaining any position due to reported pain which would flaire intermittent with no specific cause or provocation.     Focus on Therapeutic Outcomes (FOTO)  unable to  assess due to pt running late and limited evaluation due to pain      Sensation   Additional Comments hyper sensitivity noted along the dorsum of the foot      Posture/Postural Control   Posture/Postural Control Postural limitations    Postural Limitations Rounded Shoulders;Flexed trunk    Posture Comments during ice pt propped up on elbows with semi-recumbent leaning to the right noting position of comfort. He did periodically perform crunch from supine due to positional intolerance with limited visual difficulty noted.       ROM / Strength   AROM / PROM / Strength AROM;Strength      AROM   AROM Assessment Site Lumbar    Lumbar Flexion 20    Lumbar Extension 20    Lumbar - Right Side Bend 10    Lumbar - Left Side Bend 10      Strength   Overall Strength Unable to assess;Due to pain    Strength Assessment Site Hip;Knee      Palpation   Palpation comment TTP along the bil lumbar paraspinals with multipl trigger points      Transfers   Transfers Supine to Sit;Sit to Stand    Sit to Stand 5: Supervision    Supine to Sit 5: Supervision      Ambulation/Gait   Ambulation/Gait Yes    Assistive device Straight cane    Gait Pattern Step-through pattern;Decreased stride length;Decreased trunk rotation;Antalgic;Trendelenburg;Trunk flexed;Narrow base of support    Gait Comments limited stride bil with no foot drop noted, or excessive hip hiking for RLE.                       Objective measurements completed on examination: See above findings.       [facility] Adult PT Treatment/Exercise - 01/06/20 0001      Lumbar Exercises: Stretches   Lower Trunk Rotation 5 reps   difficulty due to reporting problems with the R hip     Lumbar Exercises: Seated   Other Seated Lumbar Exercises gentle trunk extension x 5    to promote extension bias in sitting due to colostomy     Modalities   Modalities Cryotherapy      Cryotherapy   Number Minutes  Cryotherapy 10 Minutes     Cryotherapy Location Lumbar Spine   in supine   Type of Cryotherapy Ice pack   attempted decompression pos, but did not tolerate                 PT Education - 01/06/20 1156    Education Details reviewed disc anatomy / biomechanics and benefits of controlled motion/ graded exercise. reviewed assessment that was able to be obtained. Reviewed if no functional/ measureable progress is made in the allotted time frim then plan to discharge at that time.    Person(s) Educated Patient    Methods Explanation;Verbal cues    Comprehension Verbalized understanding;Verbal cues required            PT Short Term Goals - 01/06/20 1231      PT SHORT TERM GOAL #1   Title pt to be I with inital HEP    Baseline no previous HEP    Time 2    Period Weeks    Status New    Target Date 01/20/20             PT Long Term Goals - 01/06/20 1231      PT LONG TERM GOAL #1   Title pt to verbalize/ demo efficient posture and lifting biomechanics as well as gait mechanics to reduce and prevent back/ RLE pain    Time 4    Period Weeks    Status New    Target Date 02/03/20      PT LONG TERM GOAL #2   Title pt to be able to sit / stand and walk for >/= 15 min with </= 6/10 pain using LRAD for in home ambualtion    Time 4    Period Weeks    Status New    Target Date 02/03/20      PT LONG TERM GOAL #3   Title pt to report reduce RLE referred symtpoms and low back pain to </= 5/10 to promote functional progression and QOL    Time 4    Period Weeks    Status New    Target Date 02/03/20      PT LONG TERM GOAL #4   Title pt to be I with all HEP given to maintain and progress current level of function.    Time 4    Period Weeks    Status New    Target Date 02/03/20                  Plan - 01/06/20 1202    Clinical Impression Statement pt presents to OPPT with CC of low back pain starting after falling on his back at work back in April with inital LLE referral that resolved after a  month, He reports increased RLE pain after a fall 6 weeks ago secondary to R foot drop that started insidiously. pt has a significant PMHx including colostomy limiting prone positioning. limited assessment secondary to limited positional tolerance, pain with frequent exacerbation throughout session with no visual or tactile provocation. pt demostrated limited stride bil but demonstated bil foot clearance and noted no sgifniciant foot drop or hip hiking as a result. pt would benefit from physical therapy to decrease pain, improve trunk mobility/ positional tolerance, and maximizing function by addressing the deficits listed.    Personal Factors and Comorbidities Comorbidity 3+    Comorbidities hx of MI, depression, PTSD, colostomy    Stability/Clinical Decision Making Unstable/Unpredictable    Clinical Decision  Making High    Rehab Potential Fair    PT Frequency 2x / week    PT Duration 3 weeks    PT Treatment/Interventions ADLs/Self Care Home Management;Cryotherapy;Electrical Stimulation;Iontophoresis 10m/ml Dexamethasone;Moist Heat;Ultrasound;Gait training;Therapeutic exercise;Therapeutic activities;Neuromuscular re-education;Manual techniques;Passive range of motion;Patient/family education;Dry needling;Taping;Traction    PT Next Visit Plan review/ update HEP, limited mobility due to pain, trial trunk extension in sitting secondary to pt having colostomy. posture educationmodalities for pain.    PT Home Exercise Plan [id number]- lower trunk rotation, seated trunk extension, seated ab set.    Consulted and Agree with Plan of Care Patient           Patient will benefit from skilled therapeutic intervention in order to improve the following deficits and impairments:  Improper body mechanics, Increased muscle spasms, Decreased strength, Pain  Visit Diagnosis: Chronic bilateral low back pain with right-sided sciatica  Abnormal posture     Problem List Patient Active Problem List   Diagnosis  Date Noted   Homelessness 12/27/2019   Hypokalemia 12/16/2019   Colostomy status ([place]Port [place] 12/16/2019   History of pulmonary embolism 12/16/2019   Nausea and vomiting 12/16/2019   Right foot drop 12/15/2019   GAD (generalized anxiety disorder) 10/20/2019   MDD (major depressive disorder) 10/20/2019   PTSD (post-traumatic stress disorder) 10/20/2019   Lumbar radiculopathy 10/19/2019   Bipolar affective disorder, currently depressed, mild ([place] 10/16/2019   ETOH abuse 10/16/2019   Protrusion of lumbar intervertebral disc 10/05/2019   Multiple subsegmental pulmonary emboli without acute cor pulmonale ([place] 07/21/2019   Bipolar 1 disorder ([place] 07/21/2019   History of colon cancer 07/21/2019   Schizoaffective disorder, bipolar type ([place] 06/03/2019   Nicotine dependence, cigarettes, uncomplicated 051/88/4166  Crohn's disease with complication ([place] 006/30/1601  Romana Deaton PT, DPT, LAT, ATC  01/06/20  12:40 PM      [facility]GLangdon NAl[place] [address]Phone: [phone]  Fax:  [phone] Name: WGranvel ProudfootMRN: [id number] of Birth: 209-Dec-1982

## 2020-01-13 ENCOUNTER — Other Ambulatory Visit: Payer: Self-pay | Admitting: Critical Care Medicine

## 2020-01-13 NOTE — Telephone Encounter (Signed)
Please refill if indicated!

## 2020-01-13 NOTE — Telephone Encounter (Signed)
Medication Refill - Medication: oxyCODONE (ROXICODONE) 15 MG immediate release tablet    Has the patient contacted their pharmacy?yes (Agent: If no, request that the patient contact the pharmacy for the refill.) (Agent: If yes, when and what did the pharmacy advise?)Contact PCP  Preferred Pharmacy (with phone number or street name):  [place], [place] - [address] [facility]. Phone:  [phone]  Fax:  [phone]       Agent: Please be advised that RX refills may take up to 3 business days. We ask that you follow-up with your pharmacy.

## 2020-01-13 NOTE — Telephone Encounter (Signed)
Requested medication (s) are due for refill today: yes  Requested medication (s) are on the active medication list:yes  Last refill:  12/27/19  #40 0 refills  Future visit scheduled: no  Notes to clinic:  Patient is requesting call from Dr Joya Gaskins. States he has a job and really needs this medication. He is in a lot of pain    Requested Prescriptions  Pending Prescriptions Disp Refills   oxyCODONE (ROXICODONE) 15 MG immediate release tablet 40 tablet 0    Sig: Take 1 tablet (15 mg total) by mouth every 6 (six) hours as needed for pain.      Not Delegated - Analgesics:  Opioid Agonists Failed - 01/13/2020  4:18 PM      Failed - This refill cannot be delegated      Passed - Urine Drug Screen completed in last 360 days.      Passed - Valid encounter within last 6 months    Recent Outpatient Visits           2 weeks ago Acute renal failure, unspecified acute renal failure type [facility])   [place] [name], MD   4 weeks ago Lumbar radiculopathy   [place] [name], MD   1 month ago Lumbar radiculopathy   [place], Patrick E, MD   2 months ago GAD (generalized anxiety disorder)   [place], Patrick E, MD   2 months ago Lumbar radiculopathy   [place] [name], MD

## 2020-01-13 NOTE — Telephone Encounter (Signed)
Copied from Media [id number]. Topic: General - Other >> Jan 13, 2020  3:57 PM Rainey Pines A wrote: Patient would like a callback from Dr. Ileana Roup nurse in regards to his pain medication being sent to pharmacy today. Patient wanted to let Dr. Joya Gaskins know that he does have a new job and will get paid from that new job in about 2 weeks. Once pt gets paid he will be able to schedule appt at the office Dr Joya Gaskins referred him to. Until then patient is wanting to know if Dr. Joya Gaskins will be able to send in his pain medication today before 6pm due to him being in pain. Please advise

## 2020-01-14 ENCOUNTER — Ambulatory Visit: Payer: Self-pay | Attending: Internal Medicine | Admitting: Physical Therapy

## 2020-01-14 ENCOUNTER — Other Ambulatory Visit: Payer: Self-pay

## 2020-01-14 ENCOUNTER — Encounter: Payer: Self-pay | Admitting: Physical Therapy

## 2020-01-14 DIAGNOSIS — R293 Abnormal posture: Secondary | ICD-10-CM | POA: Insufficient documentation

## 2020-01-14 DIAGNOSIS — M5441 Lumbago with sciatica, right side: Secondary | ICD-10-CM | POA: Insufficient documentation

## 2020-01-14 DIAGNOSIS — G8929 Other chronic pain: Secondary | ICD-10-CM | POA: Insufficient documentation

## 2020-01-14 MED ORDER — OXYCODONE HCL 15 MG PO TABS: 15.0000 mg | ORAL_TABLET | Freq: Four times a day (QID) | ORAL | 0 refills | Status: DC | PRN

## 2020-01-14 MED FILL — oxyCODONE HCL 15 MG TABS: 15 | 10 days supply | Qty: 40 | Fill #0

## 2020-01-14 NOTE — Therapy (Signed)
Juan Hester, [place], [address] Phone: [phone]   Fax:  [phone]  Physical Therapy Treatment  Patient Details  Name: Juan Hester MRN: [id number] Date of Birth: 09/26/1980 Referring Provider (PT):  Juan Polite, MD    Encounter Date: 01/14/2020   PT End of Session - 01/14/20 1018    Visit Number 2    Number of Visits 7    Date for PT Re-Evaluation 03/02/20    Authorization Type self-pay - MCD potential    PT Start Time 1016    PT Stop Time 1054    PT Time Calculation (min) 38 min    Activity Tolerance Patient limited by pain    Behavior During Therapy Juan Hester for tasks assessed/performed           Past Medical History:  Diagnosis Date   Acute renal failure ([place]) 12/16/2019   Anxiety    per pt report   Colon cancer ([place])    Crohn disease ([place])    Depression    per pt report   Foot drop, right    Myocardial infarction ([place])    PE (pulmonary thromboembolism) ([place])    PTSD (post-traumatic stress disorder)    Thyroid disease     Past Surgical History:  Procedure Laterality Date   ABDOMINAL SURGERY     COLON SURGERY     IR RADIOLOGIST EVAL & MGMT  08/31/2019   OSTOMY     SMALL INTESTINE SURGERY      There were no vitals filed for this visit.   Subjective Assessment - 01/14/20 1021    Subjective everything felt worse. worse progressively from knee to ankle. electricity sparks in foot. back is a 7/10                             [facility] Adult PT Treatment/Exercise - 01/14/20 0001      Exercises   Exercises Lumbar      Lumbar Exercises: Seated   Other Seated Lumbar Exercises heel slides with DF    Other Seated Lumbar Exercises seated ball squeeze      Manual Therapy   Manual therapy comments edu on use of roller for self STM                  PT Education - 01/14/20 1141    Education Details see plan    Person(s) Educated Patient     Methods Explanation    Comprehension Verbalized understanding;Need further instruction            PT Short Term Goals - 01/06/20 1231      PT SHORT TERM GOAL #1   Title pt to be I with inital HEP    Baseline no previous HEP    Time 2    Period Weeks    Status New    Target Date 01/20/20             PT Long Term Goals - 01/06/20 1231      PT LONG TERM GOAL #1   Title pt to verbalize/ demo efficient posture and lifting biomechanics as well as gait mechanics to reduce and prevent back/ RLE pain    Time 4    Period Weeks    Status New    Target Date 02/03/20      PT LONG TERM GOAL #2   Title pt to be able to sit / stand  and walk for >/= 15 min with </= 6/10 pain using LRAD for in home ambualtion    Time 4    Period Weeks    Status New    Target Date 02/03/20      PT LONG TERM GOAL #3   Title pt to report reduce RLE referred symtpoms and low back pain to </= 5/10 to promote functional progression and QOL    Time 4    Period Weeks    Status New    Target Date 02/03/20      PT LONG TERM GOAL #4   Title pt to be I with all HEP given to maintain and progress current level of function.    Time 4    Period Weeks    Status New    Target Date 02/03/20                 Plan - 01/14/20 1135    Clinical Impression Statement time today spent trying to educate patient on rationale for high pain levels and for doing such low level activities. Rt anterior innominate rotation contributing to hamstring and gastroc spasm. Asked him to try wearing tennis shoes in short bursts to build up tolerance and decrease hypersensitivity.    PT Treatment/Interventions ADLs/Self Care Home Management;Cryotherapy;Electrical Stimulation;Iontophoresis 22m/ml Dexamethasone;Moist Heat;Ultrasound;Gait training;Therapeutic exercise;Therapeutic activities;Neuromuscular re-education;Manual techniques;Passive range of motion;Patient/family education;Dry needling;Taping;Traction    PT Next Visit Plan  pelvic rotation manual when tolerated. begin PNE, nerve glides. precaution- colostomy    PT Home Exercise Plan [id number]    [id number] Agree with Plan of Care Patient           Patient will benefit from skilled therapeutic intervention in order to improve the following deficits and impairments:  Improper body mechanics, Increased muscle spasms, Decreased strength, Pain  Visit Diagnosis: Chronic bilateral low back pain with right-sided sciatica  Abnormal posture     Problem List Patient Active Problem List   Diagnosis Date Noted   Homelessness 12/27/2019   Hypokalemia 12/16/2019   Colostomy status ([place]eb[place] 12/16/2019   History of pulmonary embolism 12/16/2019   Nausea and vomiting 12/16/2019   Right foot drop 12/15/2019   GAD (generalized anxiety disorder) 10/20/2019   MDD (major depressive disorder) 10/20/2019   PTSD (post-traumatic stress disorder) 10/20/2019   Lumbar radiculopathy 10/19/2019   Bipolar affective disorder, currently depressed, mild ([place] 10/16/2019   ETOH abuse 10/16/2019   Protrusion of lumbar intervertebral disc 10/05/2019   Multiple subsegmental pulmonary emboli without acute cor pulmonale (HT[place] 07/21/2019   Bipolar 1 disorder ([place]Mon[place] 07/21/2019   History of colon cancer 07/21/2019   Schizoaffective disorder, bipolar type ([place] 06/03/2019   Nicotine dependence, cigarettes, uncomplicated 098/26/4158  Crohn's disease with complication ([place] 030/94/0768   Juan Hester PT, DPT 01/14/20 11:43 AM   [facility] [address]Phone: [phone]  Fax:  [phone] Name: WCosmo TetreaultMRN: [id number] of Birth: 203-05-1980

## 2020-01-16 ENCOUNTER — Other Ambulatory Visit: Payer: Self-pay | Admitting: Critical Care Medicine

## 2020-01-16 ENCOUNTER — Other Ambulatory Visit (HOSPITAL_COMMUNITY): Payer: Self-pay | Admitting: Critical Care Medicine

## 2020-01-16 MED ORDER — CYCLOBENZAPRINE HCL 10 MG PO TABS
10.0000 mg | ORAL_TABLET | Freq: Two times a day (BID) | ORAL | 0 refills | Status: DC | PRN
Start: 2020-01-16 — End: 2020-03-15

## 2020-01-17 MED FILL — CYCLOBENZAPRINE HCL 10 MG T: 10 | 15 days supply | Qty: 30 | Fill #0

## 2020-01-18 ENCOUNTER — Other Ambulatory Visit: Payer: Self-pay

## 2020-01-18 ENCOUNTER — Encounter: Payer: Self-pay | Admitting: Physical Therapy

## 2020-01-18 ENCOUNTER — Ambulatory Visit: Payer: Self-pay | Admitting: Physical Therapy

## 2020-01-18 DIAGNOSIS — G8929 Other chronic pain: Secondary | ICD-10-CM

## 2020-01-18 DIAGNOSIS — R293 Abnormal posture: Secondary | ICD-10-CM

## 2020-01-18 DIAGNOSIS — M5441 Lumbago with sciatica, right side: Secondary | ICD-10-CM

## 2020-01-18 NOTE — Therapy (Signed)
[name], [place], [address] Phone: [phone]   Fax:  [phone]  Physical Therapy Treatment  Patient Details  Name: Juan Hester MRN: [id number] Date of Birth: Jan 12, 1981 Referring Provider (PT):  Domenic Polite, MD    Encounter Date: 01/18/2020   PT End of Session - 01/18/20 0843    Visit Number 3    Number of Visits 7    Date for PT Re-Evaluation 03/02/20    Authorization Type self-pay - MCD potential    PT Start Time 0841    PT Stop Time 0922    PT Time Calculation (min) 41 min    Activity Tolerance Patient limited by pain    Behavior During Therapy [facility] for tasks assessed/performed           Past Medical History:  Diagnosis Date  . Acute renal failure ([place]) 12/16/2019  . Anxiety    per pt report  . Colon cancer ([place])   . Crohn disease ([place])   . Depression    per pt report  . Foot drop, right   . Myocardial infarction ([place])   . PE (pulmonary thromboembolism) ([place])   . PTSD (post-traumatic stress disorder)   . Thyroid disease     Past Surgical History:  Procedure Laterality Date  . ABDOMINAL SURGERY    . COLON SURGERY    . IR RADIOLOGIST EVAL & MGMT  08/31/2019  . OSTOMY    . SMALL INTESTINE SURGERY      There were no vitals filed for this visit.   Subjective Assessment - 01/18/20 0847    Subjective "nothing has changed since the last session, I am still hurting in my back and in my R foot."    Diagnostic tests 09/15/2019 IMPRESSION:1. 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    Currently in Pain? Yes    Pain Score 8     Pain Location Back    Pain Orientation Right    Pain Descriptors / Indicators Aching;Burning;Sore    Pain Type Chronic pain    Pain Onset More than a month ago    Pain Frequency Intermittent    Aggravating Factors   any activity, and sometimes with no activity    Pain Relieving Factors N/A              [facility] PT Assessment - 01/18/20 0001      Assessment   Medical Diagnosis Protrusion of lumbar intervertebral disc M51.26    Referring Provider (PT)  Domenic Polite, MD                          [facility] Adult PT Treatment/Exercise - 01/18/20 0001      Self-Care   Self-Care Other Self-Care Comments    Other Self-Care Comments  PNE regarding pain insn't damage and elevation of nerve sesnstivity using cue cards.       Lumbar Exercises: Stretches   Active Hamstring Stretch 3 reps;20 seconds   seated with knee extension     Lumbar Exercises: Seated   Other Seated Lumbar Exercises heel slides with DF  1 x 10    Other Seated Lumbar Exercises seated ball squeeze 2 x 10 holding 2 seconds, RLE marching in place 1 x 10      Modalities   Modalities Electrical Stimulation;Moist Heat  Moist Heat Therapy   Number Minutes Moist Heat 5 Minutes    Moist Heat Location Lumbar Spine   in sitting     Electrical Stimulation   Electrical Stimulation Location Attempted E-stim but pt reporoting increased pain at L3 so Dc'd modality      Manual Therapy   Manual therapy comments desensitization over dorsum of foot                    PT Short Term Goals - 01/06/20 1231      PT SHORT TERM GOAL #1   Title pt to be I with inital HEP    Baseline no previous HEP    Time 2    Period Weeks    Status New    Target Date 01/20/20             PT Long Term Goals - 01/06/20 1231      PT LONG TERM GOAL #1   Title pt to verbalize/ demo efficient posture and lifting biomechanics as well as gait mechanics to reduce and prevent back/ RLE pain    Time 4    Period Weeks    Status New    Target Date 02/03/20      PT LONG TERM GOAL #2   Title pt to be able to sit / stand and walk for >/= 15 min with </= 6/10 pain using LRAD for in home ambualtion    Time 4    Period Weeks    Status New     Target Date 02/03/20      PT LONG TERM GOAL #3   Title pt to report reduce RLE referred symtpoms and low back pain to </= 5/10 to promote functional progression and QOL    Time 4    Period Weeks    Status New    Target Date 02/03/20      PT LONG TERM GOAL #4   Title pt to be I with all HEP given to maintain and progress current level of function.    Time 4    Period Weeks    Status New    Target Date 02/03/20                 Plan - 01/18/20 0929    Clinical Impression Statement pt reports limited consistency with his HEP due to continued pain with no relief of symptoms in the back or R foot. took time to educate PNE using cue cards which he did respond to. worked on graded exercise in sitting with RLE hasmtring stretch, heel slides and ab sets. trialed E-stim with MHP in sitting DC'd modalities due to pain reporting increased pain, and worked on desensitization for R foot to calm down pain with limited improvement.    PT Treatment/Interventions ADLs/Self Care Home Management;Cryotherapy;Electrical Stimulation;Iontophoresis 108m/ml Dexamethasone;Moist Heat;Ultrasound;Gait training;Therapeutic exercise;Therapeutic activities;Neuromuscular re-education;Manual techniques;Passive range of motion;Patient/family education;Dry needling;Taping;Traction    PT Next Visit Plan pelvic rotation manual when tolerated. continue PNE, nerve glides. abset and hip isometrics.  precaution- colostomy    PT Home Exercise Plan [id number]    Consulted and Agree with Plan of Care Patient           Patient will benefit from skilled therapeutic intervention in order to improve the following deficits and impairments:  Improper body mechanics, Increased muscle spasms, Decreased strength, Pain  Visit Diagnosis: Chronic bilateral low back pain with right-sided sciatica  Abnormal posture     Problem List Patient  Active Problem List   Diagnosis Date Noted  . Homelessness 12/27/2019  . Hypokalemia  12/16/2019  . Colostomy status ([place]) 12/16/2019  . History of pulmonary embolism 12/16/2019  . Nausea and vomiting 12/16/2019  . Right foot drop 12/15/2019  . GAD (generalized anxiety disorder) 10/20/2019  . MDD (major depressive disorder) 10/20/2019  . PTSD (post-traumatic stress disorder) 10/20/2019  . Lumbar radiculopathy 10/19/2019  . Bipolar affective disorder, currently depressed, mild ([place]) 10/16/2019  . ETOH abuse 10/16/2019  . Protrusion of lumbar intervertebral disc 10/05/2019  . Multiple subsegmental pulmonary emboli without acute cor pulmonale ([place]) 07/21/2019  . Bipolar 1 disorder ([place]) 07/21/2019  . History of colon cancer 07/21/2019  . Schizoaffective disorder, bipolar type ([place]) 06/03/2019  . Nicotine dependence, cigarettes, uncomplicated 72/12/4707  . Crohn's disease with complication ([place]) 62/83/6629    Starr Lake PT, DPT, LAT, ATC  01/18/20  9:39 AM      [facility] [address] [place], [place], [address] Phone: [phone]   Fax:  [phone]  Name: Juan Hester MRN: [id number] Date of Birth: 27-Nov-1980

## 2020-01-19 ENCOUNTER — Telehealth (HOSPITAL_COMMUNITY): Payer: Self-pay | Admitting: Licensed Clinical Social Worker

## 2020-01-19 ENCOUNTER — Ambulatory Visit (HOSPITAL_COMMUNITY): Payer: No Payment, Other | Admitting: Licensed Clinical Social Worker

## 2020-01-19 NOTE — Telephone Encounter (Signed)
LCSW sent text link for 9a video session. When pt failed to sign on LCSW called pt. The call went to vm. LCSW left detailed message re purpose of call with phone # to reschedule as desired.

## 2020-01-20 ENCOUNTER — Encounter (HOSPITAL_COMMUNITY): Payer: Self-pay

## 2020-01-20 ENCOUNTER — Other Ambulatory Visit: Payer: Self-pay

## 2020-01-20 ENCOUNTER — Other Ambulatory Visit (HOSPITAL_COMMUNITY): Payer: Self-pay | Admitting: Internal Medicine

## 2020-01-20 ENCOUNTER — Ambulatory Visit (HOSPITAL_COMMUNITY)
Admission: EM | Admit: 2020-01-20 | Discharge: 2020-01-20 | Disposition: A | Discharge status: Home / Self Care | Payer: Self-pay | Attending: Internal Medicine | Admitting: Internal Medicine

## 2020-01-20 DIAGNOSIS — M5416 Radiculopathy, lumbar region: Secondary | ICD-10-CM

## 2020-01-20 MED ORDER — OXYCODONE HCL 15 MG PO TABS
15.0000 mg | ORAL_TABLET | Freq: Four times a day (QID) | ORAL | 0 refills | Status: DC | PRN
Start: 2020-01-20 — End: 2020-01-24

## 2020-01-20 MED ORDER — LIDOCAINE 5 % EX PTCH: 1.0000 | MEDICATED_PATCH | CUTANEOUS | 0 refills | Status: DC

## 2020-01-20 MED FILL — LIDOCAINE PATCH 5%: 5 | 10 days supply | Qty: 10 | Fill #0
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
## 2020-01-21 MED FILL — oxyCODONE HCL 15 MG TABS: 15 | 2 days supply | Qty: 10 | Fill #0
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
## 2020-01-23 ENCOUNTER — Telehealth: Payer: Self-pay

## 2020-01-23 NOTE — Telephone Encounter (Signed)
Incoming call from Mr. Pape, ordered his supplies from [facility], they have not arrived yet, has had same wafer on for 5 days. Patient is very pleasant, humble, "hates to ask" But starting to leak colostomy wise, [facility] is closed, asked for a wafer to get him through til shipment comes in. Ordered a 2 3/4 x2 from materials and patient came and got it.

## 2020-01-24 ENCOUNTER — Telehealth: Payer: Self-pay | Admitting: Critical Care Medicine

## 2020-01-24 ENCOUNTER — Other Ambulatory Visit (HOSPITAL_COMMUNITY): Payer: Self-pay | Admitting: Critical Care Medicine

## 2020-01-24 ENCOUNTER — Other Ambulatory Visit: Payer: Self-pay | Admitting: Critical Care Medicine

## 2020-01-24 MED ORDER — OXYCODONE HCL 15 MG PO TABS
15.0000 mg | ORAL_TABLET | Freq: Four times a day (QID) | ORAL | 0 refills | Status: DC | PRN
Start: 2020-01-24 — End: 2020-01-29

## 2020-01-24 MED FILL — oxyCODONE HCL 15 MG TABS: 15 | 5 days supply | Qty: 20 | Fill #0
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
## 2020-01-25 ENCOUNTER — Encounter: Payer: Self-pay | Admitting: Physical Therapy

## 2020-01-25 ENCOUNTER — Ambulatory Visit: Payer: Self-pay | Admitting: Physical Therapy

## 2020-01-25 ENCOUNTER — Other Ambulatory Visit: Payer: Self-pay

## 2020-01-25 DIAGNOSIS — R293 Abnormal posture: Secondary | ICD-10-CM

## 2020-01-25 DIAGNOSIS — M5441 Lumbago with sciatica, right side: Secondary | ICD-10-CM

## 2020-01-25 DIAGNOSIS — G8929 Other chronic pain: Secondary | ICD-10-CM

## 2020-01-25 NOTE — Telephone Encounter (Signed)
I spoke with this pt and sent refill in to the pharmacy

## 2020-01-25 NOTE — Therapy (Signed)
[place], [place], [address] Phone: [phone]   Fax:  [phone]  Physical Therapy Treatment  Patient Details  Name: Juan Hester MRN: [id number] Date of Birth: 09/03/1980 Referring Provider (PT):  Domenic Polite, MD    Encounter Date: 01/25/2020   PT End of Session - 01/25/20 1101    Visit Number 4    Number of Visits 7    Date for PT Re-Evaluation 03/02/20    Authorization Type self-pay - MCD potential    PT Start Time 1100    PT Stop Time 1138    PT Time Calculation (min) 38 min    Activity Tolerance Patient limited by pain    Behavior During Therapy [facility] for tasks assessed/performed           Past Medical History:  Diagnosis Date  . Acute renal failure ([place]) 12/16/2019  . Anxiety    per pt report  . Colon cancer ([place])   . Crohn disease ([place])   . Depression    per pt report  . Foot drop, right   . Myocardial infarction ([place])   . PE (pulmonary thromboembolism) ([place])   . PTSD (post-traumatic stress disorder)   . Thyroid disease     Past Surgical History:  Procedure Laterality Date  . ABDOMINAL SURGERY    . COLON SURGERY    . IR RADIOLOGIST EVAL & MGMT  08/31/2019  . OSTOMY    . SMALL INTESTINE SURGERY      There were no vitals filed for this visit.   Subjective Assessment - 01/25/20 1101    Subjective " I have taken medication for pain so today I am doing better, my foot is numb."    Diagnostic tests 09/15/2019 IMPRESSION:1. 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    Currently in Pain? Yes    Pain Score 0-No pain   took medication at 10am   Pain Orientation Right    Pain Descriptors / Indicators Aching    Pain Onset More than a month ago    Pain Frequency Intermittent    Aggravating Factors  any activity, and sometimes with    Pain  Relieving Factors pain medication              [facility] PT Assessment - 01/25/20 0001      Assessment   Medical Diagnosis Protrusion of lumbar intervertebral disc M51.26    Referring Provider (PT)  Domenic Polite, MD                          [facility] Adult PT Treatment/Exercise - 01/25/20 0001      Lumbar Exercises: Stretches   Passive Hamstring Stretch 2 reps;30 seconds      Lumbar Exercises: Aerobic   Nustep L4 x 7 min (RUE and bil LE) avoiding using LUE to reduce issues colostomy bag      Lumbar Exercises: Supine   Ab Set 10 reps;2 seconds   x 2sets   Heel Slides 10 reps   x 2 sets   Heel Slides Limitations with bolster under R trunk to prevent pressure on colostomy back    Straight Leg Raise 10 reps   x 2 sets RLE mini   Straight Leg Raises Limitations increased R foot pain to a 6-7/10 with activity      Manual Therapy  Manual Therapy Joint mobilization    Manual therapy comments graded MTPR along the R lumbar parapsinals    Joint Mobilization r innominate grade I-II anterior mob   positions in semi-sidelying propped by bolster R Lowback                 PT Education - 01/25/20 1148    Education Details discussed to make sure he is taking his medication as prescribed by his MD and if he has any questions to reach out to his MD or his pharmacist.    Person(s) Educated Patient    Methods Explanation    Comprehension Verbalized understanding            PT Short Term Goals - 01/06/20 1231      PT SHORT TERM GOAL #1   Title pt to be I with inital HEP    Baseline no previous HEP    Time 2    Period Weeks    Status New    Target Date 01/20/20             PT Long Term Goals - 01/06/20 1231      PT LONG TERM GOAL #1   Title pt to verbalize/ demo efficient posture and lifting biomechanics as well as gait mechanics to reduce and prevent back/ RLE pain    Time 4    Period Weeks    Status New    Target Date 02/03/20      PT LONG TERM GOAL  #2   Title pt to be able to sit / stand and walk for >/= 15 min with </= 6/10 pain using LRAD for in home ambualtion    Time 4    Period Weeks    Status New    Target Date 02/03/20      PT LONG TERM GOAL #3   Title pt to report reduce RLE referred symtpoms and low back pain to </= 5/10 to promote functional progression and QOL    Time 4    Period Weeks    Status New    Target Date 02/03/20      PT LONG TERM GOAL #4   Title pt to be I with all HEP given to maintain and progress current level of function.    Time 4    Period Weeks    Status New    Target Date 02/03/20                 Plan - 01/25/20 1149    Clinical Impression Statement pt arrives reporting taking a double dose of pain medication prior to coming in and reports no pain in the back or R foot. began session with graded exercises using Nu-step which he required cues to go slow and that even though the pain meds are helping we don't want to inflame it later. trialed MTPR for R lumbar paraspinal with pt in semi-L sidelying positing (using bolster propped under R low back). He was able to do heel slides and mini SLR but noted increase of R foot pain at 6-7/10, and gentle core activation which he noted increase in low back tension/ sorenss. Discussed with pt throughout session if he continues to demonstrate limited functional progress and continued worsening of symptoms reporting beging N/T and pain in the L foot that may be in his best benefit to discharge from PT and refer back to his MD / neurologist for further assessment which he verbalized understanding.  PT Next Visit Plan hip isometrics. Marland Kitchen abset and hip isometrics. Review HEP, goals, d/c  precaution- colostomy           Patient will benefit from skilled therapeutic intervention in order to improve the following deficits and impairments:  Improper body mechanics, Increased muscle spasms, Decreased strength, Pain  Visit Diagnosis: Chronic bilateral low back  pain with right-sided sciatica  Abnormal posture     Problem List Patient Active Problem List   Diagnosis Date Noted  . Homelessness 12/27/2019  . Hypokalemia 12/16/2019  . Colostomy status ([place]) 12/16/2019  . History of pulmonary embolism 12/16/2019  . Nausea and vomiting 12/16/2019  . Right foot drop 12/15/2019  . GAD (generalized anxiety disorder) 10/20/2019  . MDD (major depressive disorder) 10/20/2019  . PTSD (post-traumatic stress disorder) 10/20/2019  . Lumbar radiculopathy 10/19/2019  . Bipolar affective disorder, currently depressed, mild ([place]) 10/16/2019  . ETOH abuse 10/16/2019  . Protrusion of lumbar intervertebral disc 10/05/2019  . Multiple subsegmental pulmonary emboli without acute cor pulmonale ([place]) 07/21/2019  . Bipolar 1 disorder ([place]) 07/21/2019  . History of colon cancer 07/21/2019  . Schizoaffective disorder, bipolar type ([place]) 06/03/2019  . Nicotine dependence, cigarettes, uncomplicated 67/61/9509  . Crohn's disease with complication ([place]) 32/67/1245   Starr Lake PT, DPT, LAT, ATC  01/25/20  11:56 AM      [facility] [address] [place], [place], [address] Phone: [phone]   Fax:  [phone]  Name: Juan Hester MRN: [id number] Date of Birth: Sep 10, 1980

## 2020-01-28 ENCOUNTER — Other Ambulatory Visit: Payer: Self-pay | Admitting: Critical Care Medicine

## 2020-01-28 ENCOUNTER — Encounter (HOSPITAL_COMMUNITY): Payer: Self-pay

## 2020-01-28 ENCOUNTER — Telehealth: Payer: MEDICAID | Admitting: Emergency Medicine

## 2020-01-28 ENCOUNTER — Ambulatory Visit (HOSPITAL_COMMUNITY)
Admission: EM | Admit: 2020-01-28 | Discharge: 2020-01-28 | Disposition: A | Discharge status: Home / Self Care | Payer: Self-pay | Attending: Family Medicine | Admitting: Family Medicine

## 2020-01-28 ENCOUNTER — Other Ambulatory Visit: Payer: Self-pay

## 2020-01-28 ENCOUNTER — Telehealth: Payer: Self-pay

## 2020-01-28 ENCOUNTER — Encounter: Payer: Self-pay | Admitting: Physical Therapy

## 2020-01-28 ENCOUNTER — Telehealth: Payer: Self-pay | Admitting: Critical Care Medicine

## 2020-01-28 ENCOUNTER — Ambulatory Visit: Payer: Self-pay | Admitting: Physical Therapy

## 2020-01-28 DIAGNOSIS — G8929 Other chronic pain: Secondary | ICD-10-CM

## 2020-01-28 DIAGNOSIS — M5416 Radiculopathy, lumbar region: Secondary | ICD-10-CM

## 2020-01-28 DIAGNOSIS — M5441 Lumbago with sciatica, right side: Secondary | ICD-10-CM

## 2020-01-28 DIAGNOSIS — Z760 Encounter for issue of repeat prescription: Secondary | ICD-10-CM

## 2020-01-28 DIAGNOSIS — M545 Low back pain, unspecified: Secondary | ICD-10-CM

## 2020-01-28 DIAGNOSIS — R293 Abnormal posture: Secondary | ICD-10-CM

## 2020-01-28 MED FILL — clonazePAM 0.5 MG TABS: 0.5 | 30 days supply | Qty: 30 | Fill #1

## 2020-01-28 MED FILL — GABAPENTIN 300 MG CAPSULE: 300 | 30 days supply | Qty: 90 | Fill #0

## 2020-01-28 NOTE — Telephone Encounter (Signed)
Spoke to patient regarding message and was advised:  Per Dr. Joya Gaskins message from 01/28/2020-  Needs earlier appt than nov 16- rescheduled for 01/31/2020. Patient stated he was told he needed a  UDS.   Needs referral to neurosurgery- Unable to afford at this time.  Unable to qualify for Medicaid since he is filing for disability.   Refills sent on pain meds- Did not see where medications were sent today. Informed that the message would be routed but since Dr. Joya Gaskins is not in the office today.  Another provider would not prescribe pain medication that he was requesting. Would route to Dr. Joya Gaskins to see if could resend medication.

## 2020-01-28 NOTE — Discharge Instructions (Addendum)
Our providers usually do not do chronic pain medication refills None of the doctors today will refill narcotics Recommend follow up with your PCP

## 2020-01-28 NOTE — Telephone Encounter (Signed)
Needs earlier appt than nov 16  Needs referral to neurosurgery  Refills sent on pain meds
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
## 2020-01-28 NOTE — Telephone Encounter (Signed)
Patient called stating he scheduled a virtual appointment with Juan Hester but stated Juan Hester advise him that she would not be able to see him since Juan Hester had already sent a prescription for oxyCODONE (ROXICODONE) 15 MG immediate release tablet   Patient states he does not desire to continue care with Juan Hester. patient states the he feels that he cannot openly talk to to his Juan Hester. States that he feels that Juan Hester down talks on him and treats him as a little kid, " which he is not, he is a 39 year old man."   Patient was inform that we could change Juan Hester or provide information of other clinics. Patient wanted to know more about a program that [facility] offers and was advice to contact the pharmacy. When patient was asked if he would like to change Juan Hester, patient disconnect from call.

## 2020-01-28 NOTE — Telephone Encounter (Signed)
Pt calling again and is requesting to have another doctor tend to his mychart message. He states that he is in an abnormal amount of pain. Please advise.

## 2020-01-28 NOTE — Telephone Encounter (Signed)
Requested medication (s) are due for refill today: Yes  Requested medication (s) are on the active medication list: Yes  Last refill:  01/24/20  Future visit scheduled: Yes  Notes to clinic:  See request.    Requested Prescriptions  Pending Prescriptions Disp Refills   oxyCODONE (ROXICODONE) 15 MG immediate release tablet [Pharmacy Med Name: oxyCODONE HCL 15 MG TABS 15 Tablet] 20 tablet 0    Sig: Take 1 tablet (15 mg total) by mouth every 6 (six) hours as needed for pain.      Not Delegated - Analgesics:  Opioid Agonists Failed - 01/28/2020  9:57 AM      Failed - This refill cannot be delegated      Passed - Urine Drug Screen completed in last 360 days.      Passed - Valid encounter within last 6 months    Recent Outpatient Visits           1 month ago Acute renal failure, unspecified acute renal failure type [facility])   [place] [name], MD   1 month ago Lumbar radiculopathy   [place] [name], MD   1 month ago Lumbar radiculopathy   [place] [name], MD   2 months ago GAD (generalized anxiety disorder)   [place] [name], MD   3 months ago Lumbar radiculopathy   [place], MD       Future Appointments             In 1 month Juan Gaskins Burnett Harry, MD [facility]

## 2020-01-28 NOTE — Telephone Encounter (Signed)
Copied from [place] [id number]. Topic: General - Inquiry >> Jan 28, 2020  8:07 AM Lennox Solders wrote: Reason for CRM: Pt is calling and would like dr Joya Gaskins to return his call concerning mychart message. Pt is aware dr Joya Gaskins is not in office until monday

## 2020-01-28 NOTE — Progress Notes (Signed)
It appears that you have been having severe back pain for some time and are currently under treatment by Dr Joya Gaskins who refilled your pain medicine today. There is nothing I can do to add to your care from this platform. Please contact your doctor, or go to the emergency department if our pain is uncontrolled AFTER YOU HAVE TAKEN YOUR REGULAR PAIN MEDICATIONS.  Based on what you shared with me, I feel your condition warrants further evaluation and I recommend that you be seen for a face to face visit.  Please contact your primary care physician practice to be seen. Many offices offer virtual options to be seen via video if you are not comfortable going in person to a medical facility at this time.  If you do not have a PCP, [facility] offers a free physician referral service available at [phone]. Our trained staff has the experience, knowledge and resources to put you in touch with a physician who is right for you.   You also have the option of a video visit through [email or web address]  If you are having a true medical emergency please call 911.  NOTE: If you entered your credit card information for this eVisit, you will not be charged. You may see a "hold" on your card for the $35 but that hold will drop off and you will not have a charge processed.  Your e-visit answers were reviewed by a board certified advanced clinical practitioner to complete your personal care plan.  Thank you for using e-Visits.  **Please do not respond to this message unless you have follow up questions.** Greater than 5 but less than 10 minutes spent researching, coordinating, and implementing care for this patient today

## 2020-01-28 NOTE — Therapy (Signed)
[place], [place], [address] Phone: [phone]   Fax:  [phone]  Physical Therapy Treatment / Discharge  Patient Details  Name: Juan Hester MRN: [id number] Date of Birth: 04-01-1981 Referring Provider (PT):  Domenic Polite, MD    Encounter Date: 01/28/2020   PT End of Session - 01/28/20 1210    Visit Number 5    Number of Visits 7    Date for PT Re-Evaluation 03/02/20    PT Start Time 1100    PT Stop Time 1128    PT Time Calculation (min) 28 min    Activity Tolerance Patient limited by pain           Past Medical History:  Diagnosis Date   Acute renal failure ([place]) 12/16/2019   Anxiety    per pt report   Colon cancer ([place])    Crohn disease ([place])    Depression    per pt report   Foot drop, right    Myocardial infarction ([place])    PE (pulmonary thromboembolism) ([place])    PTSD (post-traumatic stress disorder)    Thyroid disease     Past Surgical History:  Procedure Laterality Date   ABDOMINAL SURGERY     COLON SURGERY     IR RADIOLOGIST EVAL & MGMT  08/31/2019   OSTOMY     SMALL INTESTINE SURGERY      There were no vitals filed for this visit.   Subjective Assessment - 01/28/20 1102    Subjective " I am still having quite a bit of foot pain despite taking medication before coming in.    Diagnostic tests 09/15/2019 IMPRESSION:1. 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    Currently in Pain? Yes    Pain Score 4    last took meds for pain at 9:30 took 1 pill   Pain Orientation Right    Pain Descriptors / Indicators Aching    Pain Type Chronic pain    Pain Onset More than a month ago    Pain Frequency Intermittent    Multiple Pain Sites Yes    Pain Score 8    Pain Location Foot    Pain Orientation Right    Pain Descriptors  / Indicators Aching    Pain Type Chronic pain    Pain Onset More than a month ago    Pain Frequency Intermittent    Aggravating Factors  standing/ walking any activity.              [facility] PT Assessment - 01/28/20 0001      Assessment   Medical Diagnosis Protrusion of lumbar intervertebral disc M51.26    Referring Provider (PT)  Domenic Polite, MD       AROM   Overall AROM Comments all motions increased pain in the foot.     Lumbar Flexion 25    Lumbar Extension 14    Lumbar - Right Side Bend 18    Lumbar - Left Side Bend 18      Palpation   Palpation comment TTP along the bil lumbar paraspinals with multipl trigger points                                 PT Education - 01/28/20 1150    Education Details Reviewed HEP and  discussed importance of staying consistent with his HEP to maintain his current level of function. continued PNE and discussed benefits of graded exercise. reivewed gait training taking smaller steps with heel strike/ toe off to improve balance and stability.    Person(s) Educated Patient    Methods Explanation;Verbal cues;Handout    Comprehension Verbalized understanding;Verbal cues required            PT Short Term Goals - 01/28/20 1208      PT SHORT TERM GOAL #1   Title pt to be I with inital HEP    Period Weeks    Status Partially Met             PT Long Term Goals - 01/28/20 1208      PT LONG TERM GOAL #1   Title pt to verbalize/ demo efficient posture and lifting biomechanics as well as gait mechanics to reduce and prevent back/ RLE pain    Period Weeks    Status Not Met      PT LONG TERM GOAL #2   Title pt to be able to sit / stand and walk for >/= 15 min with </= 6/10 pain using LRAD for in home ambualtion    Period Weeks    Status Not Met      PT LONG TERM GOAL #3   Title pt to report reduce RLE referred symtpoms and low back pain to </= 5/10 to promote functional progression and QOL    Period Weeks     Status Not Met      PT LONG TERM GOAL #4   Title pt to be I with all HEP given to maintain and progress current level of function.    Period Weeks    Status Not Met                 Plan - 01/28/20 1152    Clinical Impression Statement pt arrives to treatment today reporting 4/10 back pain and 8/10 R ankle along the dorsum of the foot. He reports he has been doing his exercises but is unable to tell the benefit due to stating that he has using pain medication pretty consistently at home, limited increase noted in trunk flexion and bil sidebending with report of worsening R foot pain, and would get fluctuating sharp pain in the R foot while sitting with no visual or tactile provocation and reports it still occurs frequently. He also reports issues with full body control when doing the dishes and when he wakes up after laying for ~ 6 hours. Attempted mulitple modalities for pain in PT attempting E-stim, ice, heat with no benefit and increased soreness, pt noting "the only relief I get is with pain medication."  Based on limited functional progression or any progress toward his goals, and continued high level pain with only relief noted being from medication he will be discharged from PT today back to his MD or neurologist for further assessment, which pt agreed with.    PT Treatment/Interventions ADLs/Self Care Home Management;Cryotherapy;Electrical Stimulation;Iontophoresis 21m/ml Dexamethasone;Hester Heat;Ultrasound;Gait training;Therapeutic exercise;Therapeutic activities;Neuromuscular re-education;Manual techniques;Passive range of motion;Patient/family education;Dry needling;Taping;Traction    PT Next Visit Plan discharge    Consulted and Agree with Plan of Care Patient           Patient will benefit from skilled therapeutic intervention in order to improve the following deficits and impairments:  Improper body mechanics, Increased muscle spasms, Decreased strength, Pain  Visit  Diagnosis: Chronic bilateral low back pain  with right-sided sciatica  Abnormal posture     Problem List Patient Active Problem List   Diagnosis Date Noted   Homelessness 12/27/2019   Hypokalemia 12/16/2019   Colostomy status ([place]) 12/16/2019   History of pulmonary embolism 12/16/2019   Nausea and vomiting 12/16/2019   Right foot drop 12/15/2019   GAD (generalized anxiety disorder) 10/20/2019   MDD (major depressive disorder) 10/20/2019   PTSD (post-traumatic stress disorder) 10/20/2019   Lumbar radiculopathy 10/19/2019   Bipolar affective disorder, currently depressed, mild ([place]) 10/16/2019   ETOH abuse 10/16/2019   Protrusion of lumbar intervertebral disc 10/05/2019   Multiple subsegmental pulmonary emboli without acute cor pulmonale ([place]) 07/21/2019   Bipolar 1 disorder ([place]) 07/21/2019   History of colon cancer 07/21/2019   Schizoaffective disorder, bipolar type ([place]) 06/03/2019   Nicotine dependence, cigarettes, uncomplicated 95/32/0233   Crohn's disease with complication ([place]) 43/56/8616    [place] 01/28/2020, 12:10 PM  [facility] [address] [place], [place], [address] Phone: [phone]   Fax:  [phone]  Name: Juan Hester MRN: [id number] Date of Birth: 05/26/1980       PHYSICAL THERAPY DISCHARGE SUMMARY  Visits from Start of Care: 5  Current functional level related to goals / functional outcomes: See goals   Remaining deficits: See assessment   Education / Equipment: HEP, Pain nueroscience education, posture,   Plan: Patient agrees to discharge.  Patient goals were not met. Patient is being discharged due to lack of progress.  ?????         Pattrick Bady PT, DPT, LAT, ATC  01/28/20  12:11 PM

## 2020-01-28 NOTE — Telephone Encounter (Signed)
NP Margarita Mail called in for assistance. She is stating that pt keeps reaching out to her via My-chart insisting that she prescribes a narcotic. Provider states that she has explained to pt that she is unable to prescribe narcotic and has also advised pt to see /be advised by his PCP. Pt did express to her that he no longer want to see his current PCP. (showing note in chart from pt calling the office with concern) Provider Kenton Kingfisher would like assistance explaining to pt that she is not his provider and is only able to see him via My-chart. Pt is insisting to be seen by provider for further visits.    Please assist as provider has explained to pt several times.
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
## 2020-01-29 ENCOUNTER — Other Ambulatory Visit: Payer: Self-pay | Admitting: Critical Care Medicine

## 2020-01-29 ENCOUNTER — Other Ambulatory Visit (HOSPITAL_COMMUNITY): Payer: Self-pay | Admitting: Critical Care Medicine

## 2020-01-29 DIAGNOSIS — F3131 Bipolar disorder, current episode depressed, mild: Secondary | ICD-10-CM

## 2020-01-29 MED ORDER — OXYCODONE-ACETAMINOPHEN 10-325 MG PO TABS: 1.0000 | ORAL_TABLET | Freq: Three times a day (TID) | ORAL | 0 refills | Status: AC | PRN

## 2020-01-29 MED ORDER — GABAPENTIN 300 MG PO CAPS: 600.0000 mg | ORAL_CAPSULE | Freq: Three times a day (TID) | ORAL | 2 refills | Status: DC

## 2020-01-29 NOTE — Telephone Encounter (Signed)
Again see my prior notes on this patient

## 2020-01-29 NOTE — Telephone Encounter (Signed)
[place]:  Noted appt for 10/18:  UDS needs to be obtained at that time.  Pt wanted name of neurosurgeon I am referring: Erline Levine.  I msg the pt on my chart  I did not refill his pain med per his request on Friday as he has been given #30 pain meds for prn use since 10/9 and was clearly overusing the medication  I changed to a lower dose and sent a refill today and increased his gabapentin

## 2020-01-29 NOTE — Telephone Encounter (Signed)
Pt has ov with me 10/18.  I will discuss with him this issue on Monday.  If he is trying to obtain narcotics from multiple physicians he will have to be discharged from this practice

## 2020-01-30 NOTE — Progress Notes (Signed)
Subjective:    Patient ID: Juan Hester, male    DOB: [birth year], 39 y.o.   MRN: [id number] History of Present Illness:  First OV 07/21/19 This is a complex 39 year old male who just arrived here from the [place] area and currently is living with relatives who presents for evaluation of pulmonary embolism history, Crohn's disease, history of colon carcinoma status post sigmoid resection with colostomy formation, and need for paperwork regarding patient's work status.  The patient currently is living in a hotel.  Patient arrived here in December.  The patient in [place] was under care by gastroenterologist was on Remicade for Crohn's disease and also being followed for his oncology status.  He is due for a colonoscopy at this time.  He works at [employer] he has history of bipolar disorder and had previously been on Cogentin Zyprexa, Depakote and Cymbalta.  Patient is also had major depression as well in the past.  The patient states he has significant volume output from his colostomy with a diarrheal type syndrome and he does have Imodium for this.  The patient has abdominal pain at times.  He has chronic fatigue.  He cannot stand and one place for prolonged periods of time.  He fell at work and his lower back has been difficult for him since that fall.  He was in the emergency room and underwent CT scan of the head and this was negative.  The patient is needing refills on his medications and he does not have insurance at this time.  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  The patient states his back pain is worsening.  He did fall at work and March.  He states the pain was tolerable but now markedly  worse if he stands for long periods of time.  He was not able to use a Lidoderm patch as he cannot reach the center part of his back.  He notes pain shooting down both legs.  He has some difficulty walking as well at this time.  There has not been any lumbar imaging studies done although when he went to the emergency room previously there was a CT scan of the abdomen was done and did not show changes in the lumbar spine but this was a limited study  Patient states his cyclobenzaprine is not useful but he is not taking it on a regular basis The patient has no change in his breathing status and is maintaining his anticoagulant for history of pulmonary emboli.  The patient is yet to achieve a gastroenterology appointment as he needs the orange card first.  He has his financial paperwork together so make an appointment for the financial counselor  08/18/2019 The patient comes in with lower extremity edema and is just gone to the ER 1 day ago for progressive back pain which occurred on the job.  He has since been let go from the job he was working.  He has another job as well he works.  The patient is yet to receive his lumbar x-rays as ordered.  He does not have financial assistance as of yet to be able to achieve an MRI or orthopedics appointment.  Patient notes increased edema in the lower extremities and some shortness of breath with exertion. Note I reviewed his CT scan of the chest March which did not show pulmonary emboli but did show atelectasis at the bases of the lungs.   Wt Readings from Last 3 Encounters: 08/18/19 : 274 lb (124.3 kg) 07/21/19 : 264 lb 3.2 oz (119.8 kg) 07/11/19 : 260 lb (117.9 kg)  Patient cannot sleep flat is still smoking half pack a day of cigarettes denies wheezing slight productive cough of green mucus no sinus complaints  09/01/2019 This patient is seen in return follow-up and has been to the emergency room twice since I last saw him several weeks ago.  Patient has  continued pain in the lower back.  He has a normal lumbar study.  He had fallen at work.  He is now been fired from his job.  He has retained an attorney for [employer].  The patient states his pain is more in the upper thoracic area as well as in the lumbar area at this time.  Note he also went to interventional radiology they recommend not starting in this patient with an in fear vena cava filter but instead study his lower extremities first.  He is yet to have a vascular study the lower extremities for DVT.  He is now off Xarelto because he was bleeding from an opening of the rectal closure from his previous total colectomy.  The patient still has some difficulty in that area but the bleeding is less off the Xarelto.  Patient is also seeing gastroenterology and they are awaiting further evaluation of the rectal tear.  The patient is yet to see a surgeon for this.  He is here today for follow-up.  10/19/2019 This patient is seen in return follow-up and notes continued low back pain with radiculopathy down the left lower extremity.  He saw orthopedics and they recommended an injection however they said that they could not perform it under his Workmen's Comp. agreement.  He is unable to afford any type of procedure as he is self-pay at this time.  He is also wishing a second opinion on his back.  In addition to this he has had increased depression and mood swings and this ultimately resulted in increased alcohol use when he was with a friend over the weekend and when the friend left him he became extremely despondent and started thinking of suicidal ideation.  He called for help and was brought to the emergency room where he had a 36-hour stay in involuntary commitment until he can be cleared by psychiatry which did occurred he was just discharged 24 hours ago.  He was given a paper prescription for Seroquel and also for his Depakote however he cannot afford these medications at this time.  He  was also not given a refill on Cymbalta and has run out of the Cymbalta.  He also has run out of his oxycodone as well for his back pain.  He does have a supply of Xarelto and maintains this 20 mg daily without any additional bleeding noted in the rectal area.  When he came to the ER he was very much confused had been drinking excessively and somewhat altered he did clear this out and he has not had further drinking since that time.  He states when he uses the oxycodone he only takes it about 3-4 times daily.  He states the cyclobenzaprine was of no use to him.  He  has had several falls because of left-sided weakness in the lower extremity.  He feels like there is electric shock going down his leg and this resulted in the falls.  He has bruised his left shoulder with an injury and a fall in his bathroom. He was unable to obtain transportation therefore this visit was done via video Note the psychiatry doctor did discontinue his Zyprexa in favor of use of Seroquel 200 mg daily.  He does have follow-up with a behavioral therapist and psychiatrist in the next 24 hours at [facility].  11/01/2019 This is a video visit and follow-up from her previous video visit several weeks ago.  The patient attempted to get to see neurosurgery but they were going to charge her co-pay he was not able to achieve that visit.  The patient is no longer drinking alcohol regularly.  He still has significant anxiety.  He did see the mental health providers at the clinic and they prescribed hydroxyzine for anxiety and this has not helped his symptoms.  He is wishing to get back on the some type of benzodiazepine.  He is seeing a Social worker at this time.  He denies any bleeding from the rectal area.  He still has low back pain.  He is using the oxycodone as needed for pain and only minimally using this.  He has called his lawyer deceiving get Workmen's Comp. to cover the neurosurgical  evaluation.  12/02/2019 I attempted to have a video visit with this patient he was in his hotel and appeared to be under some influence.  He could not focus or listen properly to the interaction.  He was stating he was having new onset shoulder pain and a cough.  He states his back is still tender.  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  He is trying to save up his money so that he can undergo spinal evaluations.  He is on a fairly high dose of oxycodone and this will need to be reduced.  I discussed with him the need and the importance of changing his pain medication.  He took this under advisement.  It was difficult to proceed with any other elements of this visit so I told the patient we would follow-up with him in the office setting  12/15/2019 This patient returns in follow-up and has severe pain and now is developed foot drop on the right lower extremity.  He has yet to make his orthopedic appointment.  He has lost 35 pounds in weight.  He has a sore on the dorsal aspect of his right foot with surrounding erythema that he has noticed.  His pain is worse in the lower back area.  Note he does have significant lumbar disease on the MRI we previously have obtained.  He is out of his pain medications at this time.  I explained to him in order to maintain him on pain medicine we need to have him come in for regular face-to-face visit so that we can obtain a urine drug screen.  He says he is not using other substances.  He does state that his mental health became a little unbalanced when he stopped taking some of his medicines but now is back on his mental health medications He states on the Xarelto he occasionally has some blood from his colostomy area and rectal areas  The patient says the foot drop occurred several weeks ago and he had to crawl about  his apartment and developed sores on his feet and knees he says he is able to walk better now it suddenly changed  12/27/2019 This patient  seen back in return follow-up after hospitalization between the second and 6 September.  During the hospitalization was found to have acute renal failure from severe volume depletion related to high colostomy output.  There was no evidence of active infection.  The patient had electrolyte derangements and elevated creatinine.  He was given IV fluids and told to follow-up.  He states since he has been home he still remains weak but he is improved compared to before.  He has less nausea.  He does have some black stools coming out of the ostomy.  He notes if he talks a lot his throat will gag and feel dry.  His pain in his lower back is not a big issue as it is in the lower extremity and will on the left. Below is a copy of the discharge summary  Admit date: 12/16/2019 Discharge date: 12/20/2019  Time spent: 35 minutes  Recommendations for Outpatient Follow-up:  1. PCP Dr. Joya Gaskins in 1 week, consider weaning narcotics, please check BMP in 1 week, emphasized compliance with Lomotil    Discharge Diagnoses:  Principal Problem:   Acute renal failure ([place]) High colostomy output   Crohn's disease with complication ([place])   History of colon cancer   ETOH abuse   Schizoaffective disorder, bipolar type ([place])   Abscess or cellulitis of foot   Colostomy status ([place])   History of pulmonary embolism   Nausea and vomiting   Discharge Condition: Stable  Diet recommendation: Regular  Filed Weights  12/16/19 2113 12/18/19 0402 Weight: 115.7 kg 118.1 kg   History of present illness:  Juan Ritteris a 39/M wCrohn's disease, colon cancer s/p L hemicolectomy and ostomy, prior multifocal PEs on Xarelto, schizoaffective disorder, alcohol use recent L4-5 injury with right foot drop. Patient was sent to the ED on 12/16/2019 from PCPs office for abnormal labs. 10 days prior to presentation, patient started 'fasting for spiritual reasons' where he did not eat anything but drank plenty of fluids. In  about a week, he started having bilious vomiting. He gave up fasting but could not tolerate any food. 9/1, pt went to PCPs office for right foot wound. Routine labs showed elevated creatinine and hence patient was sent to the ED. Also reports chronic freq stools/ostomy output>10times/day at baseline, supposed to be on lomotilbut has not taken this in the past few weeks. In the ED, hemodynamically stable Labs noted creatinine elevated to 3.48, baseline 1.2, lipase elevated to 278, LFT normal. CT scan of abdomen pelvis did not show any acute abnormality  Hospital Course:   Acute kidney injury  -Admitted with creatinine of 3.5 up from baseline of 1.2  -due to dehydration related to poor oral intake and chronic GI losses, has high output from his colostomy and daily naproxen use -Hydrated with normal saline, Lomotil added 4 times daily -Kidney function improving, creatinine down to 1.9 today -Follow-up with Dr. Joya Gaskins in 1 week, check BMP at follow-up  H/o PE -Xarelto held in the setting of AKI and was briefly on a heparin bridge instead, since kidney function is improving discussed with Pharm.D. will restart Xarelto at discharge  Hypomagnesemia, hypokalemia -Replaced  Intractable nausea vomiting -Resolved, -CT abdomen pelvis was unremarkable  Right foot scab -No evidence of secondary infection, does not need antibiotics for it  at this time  History of Crohn's disease,  colon cancer s/p colectomy -No evidence of obstruction CT abdomen pelvis. It showed some parasternal hernia and mild ileus likely chronic. -Ostomy nurse consulted -Follow-up with gastroenterology, patient reports that he is unable to do so since he does not have Medicaid yet in [place]  Recent L4-L5 injury with right foot drop -Patient walks with a walker. -On pain control with oxycodone 15 mg every 6 hours at baseline, this was continued, per nursing staff was requesting IV narcotics while  inpatient  Alcohol use -Patient denies daily alcohol use, no withdrawal noted  Schizoaffective disorder  Continue seroquel, Depakote, Cymbalta.  There is been a transition of care visit with the case manager last week on the ninth.  Note the patient went to urgent care on the eighth and had follow-up labs which were stable.   The patient is working attorney to see if he can get disability or Workmen's Compensation for his back disease so he can see a Licensed conveyancer.  He knows without insurance he is not able to access any specialist at this time.  We have been getting his medications to him for free through the patient assistance fund  10/18 This patient is seen in return follow-up today complaining of severe right foot pain and inability to walk properly. He has severe lumbar radiculopathy and is yet to see a spine specialist. He states his workman compensation payout may occur in the next several days. He ran out of the oxycodone 15 mg and a refill for Percocet was given. He pushed back very hard on this at this visit stating this does not work for him. I engaged with the patient and told him that this was what I was comfortable prescribing given his very high risk due to multiple medications interacting resulting in potential high abuse threshold and also potential side effect of unconsciousness  He states he still has a high output from his ostomy and ran out of the Lomotil I indicated we would refill this  I also indicated to the patient he has been using a patient assistance phone which is short-term only and we have been waiting to try to get him either [place] assistance or application for disability. He states his disability is in process but may not come through anytime soon  Past Medical History:  Diagnosis Date  . Acute renal failure ([place]) 12/16/2019  . Anxiety    per pt report  . Colon cancer ([place])   . Crohn disease ([place])   . Depression    per pt report  . Foot drop,  right   . Myocardial infarction ([place])   . PE (pulmonary thromboembolism) ([place])   . PTSD (post-traumatic stress disorder)   . Thyroid disease      History reviewed. No pertinent family history.   Social History   Socioeconomic History  . Marital status: Divorced    Spouse name: Not on file  . Number of children: Not on file  . Years of education: Not on file  . Highest education level: Not on file  Occupational History  . Not on file  Tobacco Use  . Smoking status: Current Every Day Smoker    Packs/day: 1.00  . Smokeless tobacco: Never Used  Vaping Use  . Vaping Use: Never used  Substance and Sexual Activity  . Alcohol use: Yes  . Drug use: Not Currently  . Sexual activity: Yes  Other Topics Concern  . Not on file  Social History Narrative  . Not on  file   Social Determinants of Health   Financial Resource Strain:   . Difficulty of Paying Living Expenses: Not on file  Food Insecurity:   . Worried About Charity fundraiser in the Last Year: Not on file  . Ran Out of Food in the Last Year: Not on file  Transportation Needs:   . Lack of Transportation (Medical): Not on file  . Lack of Transportation (Non-Medical): Not on file  Physical Activity:   . Days of Exercise per Week: Not on file  . Minutes of Exercise per Session: Not on file  Stress:   . Feeling of Stress : Not on file  Social Connections:   . Frequency of Communication with Friends and Family: Not on file  . Frequency of Social Gatherings with Friends and Family: Not on file  . Attends Religious Services: Not on file  . Active Member of Clubs or Organizations: Not on file  . Attends Archivist Meetings: Not on file  . Marital Status: Not on file  Intimate Partner Violence:   . Fear of Current or Ex-Partner: Not on file  . Emotionally Abused: Not on file  . Physically Abused: Not on file  . Sexually Abused: Not on file     Allergies  Allergen Reactions  . Azathioprine Other (See  Comments)    Pt does not recall reaction    . Ciprofloxacin Other (See Comments)    Pt does not recall reaction    . Mesalamine Other (See Comments)    Pt does not recall reaction  . Metronidazole Other (See Comments)    Pt does not recall reaction   . Sulfa Antibiotics Other (See Comments)    Pt does not recall reaction   . Barium Sulfate Rash  . Barium-Containing Compounds Rash     Outpatient Medications Prior to Visit  Medication Sig Dispense Refill  . acetaminophen (TYLENOL) 325 MG tablet Take 2 tablets (650 mg total) by mouth every 6 (six) hours as needed for mild pain (or Fever >/= 101).    . clonazePAM (KLONOPIN) 0.5 MG tablet Take 0.5 tablets (0.25 mg total) by mouth 2 (two) times daily. 60 tablet 2  . cyclobenzaprine (FLEXERIL) 10 MG tablet Take 1 tablet (10 mg total) by mouth 2 (two) times daily as needed for muscle spasms. 30 tablet 0  . divalproex (DEPAKOTE) 500 MG DR tablet Take 1 tablet (500 mg total) by mouth 2 (two) times daily. 60 tablet 2  . DULoxetine (CYMBALTA) 60 MG capsule Take 1 capsule (60 mg total) by mouth daily. 30 capsule 2  . gabapentin (NEURONTIN) 300 MG capsule Take 2 capsules (600 mg total) by mouth 3 (three) times daily. 180 capsule 2  . naloxone (NARCAN) nasal spray 4 mg/0.1 mL Use as needed for overdose symptoms from opiate 1 each 0  . QUEtiapine (SEROQUEL) 300 MG tablet Take 1 tablet (300 mg total) by mouth at bedtime. 30 tablet 2  . QUEtiapine (SEROQUEL) 50 MG tablet Take 1 tablet (50 mg total) by mouth 2 (two) times daily. 60 tablet 2  . rivaroxaban (XARELTO) 20 MG TABS tablet Take 1 tablet (20 mg total) by mouth daily with supper. 30 tablet 4  . simethicone (MYLICON) 80 MG chewable tablet Chew 80 mg by mouth every 6 (six) hours as needed for flatulence.    . diphenoxylate-atropine (LOMOTIL) 2.5-0.025 MG tablet Take 1 tablet by mouth 4 (four) times daily. 90 tablet 0  . lidocaine (LIDODERM) 5 % Place  1 patch onto the skin daily. Remove & Discard  patch within 12 hours or as directed by MD (Patient not taking: Reported on 01/31/2020) 10 patch 0  . oxyCODONE-acetaminophen (PERCOCET) 10-325 MG tablet Take 1 tablet by mouth every 8 (eight) hours as needed for up to 5 days for pain. (Patient not taking: Reported on 01/31/2020) 30 tablet 0   No facility-administered medications prior to visit.      Review of Systems  Constitutional: Positive for activity change.  HENT: Negative.   Eyes: Negative.   Respiratory: Negative for cough, shortness of breath and wheezing.   Cardiovascular: Negative for palpitations and leg swelling.  Gastrointestinal: Positive for diarrhea. Negative for abdominal distention, anal bleeding, blood in stool, constipation, nausea, rectal pain and vomiting.  Endocrine: Negative.   Genitourinary: Negative.   Musculoskeletal: Positive for arthralgias, back pain, gait problem and joint swelling.  Neurological: Positive for dizziness and weakness.  Psychiatric/Behavioral: Positive for sleep disturbance. Negative for agitation, behavioral problems, decreased concentration, dysphoric mood, self-injury and suicidal ideas. The patient is not nervous/anxious and is not hyperactive.        Objective:   Physical Exam Vitals:   01/31/20 0917  BP: 104/75  Pulse: (!) 104  SpO2: 100%  Weight: 251 lb 12.8 oz (114.2 kg)  Height: 6' 1"  (1.854 m)    Gen: Pleasant, well-nourished, in no distress,  normal affect  ENT: No lesions,  mouth clear,  oropharynx clear, no postnasal drip  Neck: No JVD, no TMG, no carotid bruits  Lungs: No use of accessory muscles, no dullness to percussion, clear without rales or rhonchi  Cardiovascular: RRR, heart sounds normal, no murmur or gallops, no peripheral edema  Abdomen: soft and NT, no HSM,  BS normal  Musculoskeletal: No deformities, no cyanosis or clubbing  Neuro: alert, non focal  Skin: Warm, healing excoriations on both dorsum of both feet    BMP Latest Ref Rng & Units  12/22/2019 12/20/2019 12/19/2019  Glucose 70 - 99 mg/dL 164(H) 116(H) 107(H)  BUN 6 - 20 mg/dL 6 14 20   Creatinine 0.61 - 1.24 mg/dL 1.64(H) 1.93(H) 2.46(H)  BUN/Creat Ratio 9 - 20 - - -  Sodium 135 - 145 mmol/L 138 135 137  Potassium 3.5 - 5.1 mmol/L 3.4(L) 3.6 3.5  Chloride 98 - 111 mmol/L 100 101 100  CO2 22 - 32 mmol/L 26 23 27   Calcium 8.9 - 10.3 mg/dL 9.1 8.2(L) 8.4(L)   CBC Latest Ref Rng & Units 12/22/2019 12/20/2019 12/19/2019  WBC 4.0 - 10.5 K/uL 6.8 2.6(L) 2.8(L)  Hemoglobin 13.0 - 17.0 g/dL 12.0(L) 9.7(L) 10.0(L)  Hematocrit 39 - 52 % 36.4(L) 29.7(L) 30.2(L)  Platelets 150 - 400 K/uL 156 80(L) 80(L)   Hepatic Function Latest Ref Rng & Units 12/22/2019 12/17/2019 12/16/2019  Total Protein 6.5 - 8.1 g/dL 7.2 7.1 7.9  Albumin 3.5 - 5.0 g/dL 3.3(L) 3.1(L) 3.5  AST 15 - 41 U/L 43(H) 32 38  ALT 0 - 44 U/L 28 31 37  Alk Phosphatase 38 - 126 U/L 109 86 91  Total Bilirubin 0.3 - 1.2 mg/dL 0.6 1.0 1.0  Bilirubin, Direct 0.00 - 0.40 mg/dL - - -       Assessment & Plan:  I personally reviewed all images and lab data in the [facility] system as well as any outside material available during this office visit and agree with the  radiology impressions.   Multiple subsegmental pulmonary emboli without acute cor pulmonale (HCC) Continue Xarelto for life  Crohn's disease with complication ([place]) Treatment as per gastroenterology  Lumbar radiculopathy Lumbar radiculopathy again have made referrals on several occasions to neurosurgery I will make out yet another referral and told the patient he must keep this appointment  I will refill Percocet 10 mg/325 1 every 8 hours and I gave him 30 of these will also increase gabapentin to 600 mg 3 times daily  Protrusion of lumbar intervertebral disc As per radiculopathy assessment  Bipolar affective disorder, currently depressed, mild ([place]) Bipolar disorder continue current therapy per mental health provider   Juan Hester was seen today for back pain.  Diagnoses  and all orders for this visit:  Lumbar radiculopathy  Chronic pain syndrome -     [id number]+Oxyco+Alc+Crt-Bund  Multiple subsegmental pulmonary emboli without acute cor pulmonale (HCC)  Crohn's disease with complication, unspecified gastrointestinal tract location [facility])  Protrusion of lumbar intervertebral disc  Bipolar affective disorder, currently depressed, mild ([place])  Other orders -     diphenoxylate-atropine (LOMOTIL) 2.5-0.025 MG tablet; Take 1 tablet by mouth 4 (four) times daily.

## 2020-01-31 ENCOUNTER — Other Ambulatory Visit: Payer: Self-pay

## 2020-01-31 ENCOUNTER — Other Ambulatory Visit (HOSPITAL_COMMUNITY): Payer: Self-pay | Admitting: Critical Care Medicine

## 2020-01-31 ENCOUNTER — Ambulatory Visit: Payer: Self-pay | Attending: Critical Care Medicine | Admitting: Critical Care Medicine

## 2020-01-31 ENCOUNTER — Ambulatory Visit: Payer: Self-pay | Admitting: Physical Therapy

## 2020-01-31 ENCOUNTER — Encounter: Payer: Self-pay | Admitting: Critical Care Medicine

## 2020-01-31 VITALS — BP 104/75 | HR 104 | Ht 73.0 in | Wt 251.8 lb

## 2020-01-31 DIAGNOSIS — G894 Chronic pain syndrome: Secondary | ICD-10-CM | POA: Insufficient documentation

## 2020-01-31 DIAGNOSIS — F3131 Bipolar disorder, current episode depressed, mild: Secondary | ICD-10-CM

## 2020-01-31 DIAGNOSIS — M5126 Other intervertebral disc displacement, lumbar region: Secondary | ICD-10-CM

## 2020-01-31 DIAGNOSIS — I2694 Multiple subsegmental pulmonary emboli without acute cor pulmonale: Secondary | ICD-10-CM

## 2020-01-31 DIAGNOSIS — M5416 Radiculopathy, lumbar region: Secondary | ICD-10-CM

## 2020-01-31 DIAGNOSIS — K50919 Crohn's disease, unspecified, with unspecified complications: Secondary | ICD-10-CM

## 2020-01-31 MED ORDER — DIPHENOXYLATE-ATROPINE 2.5-0.025 MG PO TABS
1.0000 | ORAL_TABLET | Freq: Four times a day (QID) | ORAL | 0 refills | Status: DC
Start: 2020-01-31 — End: 2020-05-04

## 2020-01-31 MED FILL — DIPHENOXYLATE-ATROPINE 2.5-: 2.5-0.025 | 23 days supply | Qty: 90 | Fill #0

## 2020-01-31 MED FILL — OXYCODONE-APAP 10-325: 10-325 | 10 days supply | Qty: 30 | Fill #0

## 2020-01-31 NOTE — Assessment & Plan Note (Signed)
Continue Xarelto for life

## 2020-01-31 NOTE — Progress Notes (Signed)
Having pain in back and right foot.

## 2020-01-31 NOTE — Assessment & Plan Note (Signed)
As per radiculopathy assessment

## 2020-01-31 NOTE — Assessment & Plan Note (Signed)
Lumbar radiculopathy again have made referrals on several occasions to neurosurgery I will make out yet another referral and told the patient he must keep this appointment  I will refill Percocet 10 mg/325 1 every 8 hours and I gave him 30 of these will also increase gabapentin to 600 mg 3 times daily

## 2020-01-31 NOTE — Assessment & Plan Note (Signed)
Treatment as per gastroenterology

## 2020-01-31 NOTE — Assessment & Plan Note (Signed)
Bipolar disorder continue current therapy per mental health provider

## 2020-01-31 NOTE — Patient Instructions (Signed)
Please schedule your appointment with neurosurgery as soon as possible  Refills on Neurontin Lomotil and a new Percocet prescription was given and sent to [facility] outpatient pharmacy  No other change in medications  Return Dr. Joya Gaskins 2 months

## 2020-02-01 ENCOUNTER — Telehealth (HOSPITAL_COMMUNITY): Payer: No Payment, Other | Admitting: Psychiatry

## 2020-02-04 ENCOUNTER — Ambulatory Visit: Payer: Self-pay | Admitting: Physical Therapy

## 2020-02-07 ENCOUNTER — Telehealth: Payer: Self-pay | Admitting: Critical Care Medicine

## 2020-02-07 ENCOUNTER — Other Ambulatory Visit: Payer: Self-pay

## 2020-02-07 ENCOUNTER — Emergency Department (HOSPITAL_COMMUNITY): Payer: Self-pay

## 2020-02-07 ENCOUNTER — Ambulatory Visit: Payer: Self-pay | Admitting: *Deleted

## 2020-02-07 ENCOUNTER — Other Ambulatory Visit (HOSPITAL_COMMUNITY): Payer: Self-pay | Admitting: Critical Care Medicine

## 2020-02-07 ENCOUNTER — Emergency Department (HOSPITAL_COMMUNITY)
Admission: EM | Admit: 2020-02-07 | Discharge: 2020-02-07 | Disposition: A | Discharge status: Home / Self Care | Payer: Self-pay | Attending: Emergency Medicine | Admitting: Emergency Medicine

## 2020-02-07 ENCOUNTER — Other Ambulatory Visit: Payer: Self-pay | Admitting: Critical Care Medicine

## 2020-02-07 DIAGNOSIS — F17200 Nicotine dependence, unspecified, uncomplicated: Secondary | ICD-10-CM | POA: Insufficient documentation

## 2020-02-07 DIAGNOSIS — R059 Cough, unspecified: Secondary | ICD-10-CM | POA: Insufficient documentation

## 2020-02-07 DIAGNOSIS — R079 Chest pain, unspecified: Secondary | ICD-10-CM | POA: Insufficient documentation

## 2020-02-07 DIAGNOSIS — Z20822 Contact with and (suspected) exposure to covid-19: Secondary | ICD-10-CM | POA: Insufficient documentation

## 2020-02-07 DIAGNOSIS — E876 Hypokalemia: Secondary | ICD-10-CM | POA: Insufficient documentation

## 2020-02-07 DIAGNOSIS — R0602 Shortness of breath: Secondary | ICD-10-CM | POA: Insufficient documentation

## 2020-02-07 LAB — CBC WITH DIFFERENTIAL/PLATELET
Abs Immature Granulocytes: 0.04 10*3/uL (ref 0.00–0.07)
Basophils Absolute: 0.1 10*3/uL (ref 0.0–0.1)
Basophils Relative: 1 %
Eosinophils Absolute: 0.2 10*3/uL (ref 0.0–0.5)
Eosinophils Relative: 2 %
HCT: 37.7 % — ABNORMAL LOW (ref 39.0–52.0)
Hemoglobin: 12.8 g/dL — ABNORMAL LOW (ref 13.0–17.0)
Immature Granulocytes: 1 %
Lymphocytes Relative: 12 %
Lymphs Abs: 1 10*3/uL (ref 0.7–4.0)
MCH: 33.4 pg (ref 26.0–34.0)
MCHC: 34 g/dL (ref 30.0–36.0)
MCV: 98.4 fL (ref 80.0–100.0)
Monocytes Absolute: 0.8 10*3/uL (ref 0.1–1.0)
Monocytes Relative: 10 %
Neutro Abs: 6.4 10*3/uL (ref 1.7–7.7)
Neutrophils Relative %: 74 %
Platelets: 198 10*3/uL (ref 150–400)
RBC: 3.83 MIL/uL — ABNORMAL LOW (ref 4.22–5.81)
RDW: 16.5 % — ABNORMAL HIGH (ref 11.5–15.5)
WBC: 8.5 10*3/uL (ref 4.0–10.5)
nRBC: 0 % (ref 0.0–0.2)

## 2020-02-07 LAB — COMPREHENSIVE METABOLIC PANEL
ALT: 60 U/L — ABNORMAL HIGH (ref 0–44)
AST: 49 U/L — ABNORMAL HIGH (ref 15–41)
Albumin: 3.5 g/dL (ref 3.5–5.0)
Alkaline Phosphatase: 121 U/L (ref 38–126)
Anion gap: 11 (ref 5–15)
BUN: 11 mg/dL (ref 6–20)
CO2: 27 mmol/L (ref 22–32)
Calcium: 8.5 mg/dL — ABNORMAL LOW (ref 8.9–10.3)
Chloride: 100 mmol/L (ref 98–111)
Creatinine, Ser: 1.24 mg/dL (ref 0.61–1.24)
GFR, Estimated: >60 mL/min (ref >60)
Glucose, Bld: 104 mg/dL — ABNORMAL HIGH (ref 70–99)
Potassium: 2.8 mmol/L — ABNORMAL LOW (ref 3.5–5.1)
Sodium: 138 mmol/L (ref 135–145)
Total Bilirubin: 1.1 mg/dL (ref 0.3–1.2)
Total Protein: 6.7 g/dL (ref 6.5–8.1)

## 2020-02-07 LAB — URINALYSIS, ROUTINE W REFLEX MICROSCOPIC
Bilirubin Urine: NEGATIVE
Glucose, UA: NEGATIVE mg/dL
Hgb urine dipstick: NEGATIVE
Ketones, ur: NEGATIVE mg/dL
Leukocytes,Ua: NEGATIVE
Nitrite: NEGATIVE
Protein, ur: NEGATIVE mg/dL
Specific Gravity, Urine: >1.046 — ABNORMAL HIGH (ref 1.005–1.030)
pH: 6 (ref 5.0–8.0)

## 2020-02-07 LAB — MAGNESIUM: Magnesium: 1.6 mg/dL — ABNORMAL LOW (ref 1.7–2.4)

## 2020-02-07 LAB — RESPIRATORY PANEL BY RT PCR (FLU A&B, COVID)
Influenza A by PCR: NEGATIVE
Influenza B by PCR: NEGATIVE
SARS Coronavirus 2 by RT PCR: NEGATIVE

## 2020-02-07 LAB — TROPONIN I (HIGH SENSITIVITY)
Troponin I (High Sensitivity): 4 ng/L (ref <18)
Troponin I (High Sensitivity): 5 ng/L (ref <18)

## 2020-02-07 MED ORDER — IOHEXOL 350 MG/ML SOLN
75.0000 mL | Freq: Once | INTRAVENOUS | Status: AC | PRN
  Administered 2020-02-07: 75 mL via INTRAVENOUS

## 2020-02-07 MED ORDER — POTASSIUM CHLORIDE ER 10 MEQ PO TBCR: 10.0000 meq | EXTENDED_RELEASE_TABLET | Freq: Every day | ORAL | 0 refills | Status: DC

## 2020-02-07 MED ORDER — FENTANYL CITRATE (PF) 100 MCG/2ML IJ SOLN
50.0000 ug | Freq: Once | INTRAMUSCULAR | Status: AC
  Administered 2020-02-07: 50 ug via INTRAVENOUS
  Filled 2020-02-07: qty 2

## 2020-02-07 MED ORDER — POTASSIUM CHLORIDE 10 MEQ/100ML IV SOLN
10.0000 meq | INTRAVENOUS | Status: AC
  Administered 2020-02-07 (×2): 10 meq via INTRAVENOUS
  Filled 2020-02-07 (×2): qty 100

## 2020-02-07 MED ORDER — POTASSIUM CHLORIDE CRYS ER 20 MEQ PO TBCR
40.0000 meq | EXTENDED_RELEASE_TABLET | Freq: Once | ORAL | Status: AC
  Administered 2020-02-07: 40 meq via ORAL
  Filled 2020-02-07: qty 2

## 2020-02-07 MED ORDER — LIDOCAINE VISCOUS HCL 2 % MT SOLN
15.0000 mL | Freq: Once | OROMUCOSAL | Status: AC
  Administered 2020-02-07: 15 mL via ORAL
  Filled 2020-02-07: qty 15

## 2020-02-07 MED ORDER — ALUM & MAG HYDROXIDE-SIMETH 200-200-20 MG/5ML PO SUSP
30.0000 mL | Freq: Once | ORAL | Status: AC
  Administered 2020-02-07: 30 mL via ORAL
  Filled 2020-02-07: qty 30

## 2020-02-07 MED ORDER — OXYCODONE HCL 5 MG PO TABS
10.0000 mg | ORAL_TABLET | Freq: Four times a day (QID) | ORAL | 0 refills | Status: DC | PRN
Start: 2020-02-07 — End: 2020-02-11

## 2020-02-07 MED FILL — POTASSIUM CHLORIDE CRYS ER: 10 | 5 days supply | Qty: 5 | Fill #0

## 2020-02-07 MED FILL — oxyCODONE HCL 5 MG TABS: 5 | 5 days supply | Qty: 40 | Fill #0

## 2020-02-07 NOTE — ED Triage Notes (Signed)
Pt here via ems with reports of coughing every 30 minutes. States the coughing goes on so long he gets dizzy and his fingers tingle. Pt also with complaints of leg pain, hx drop foot. Pt VSS. HX PE earlier this year and supposed to be on xarelto, quit taking 3 weeks ago.

## 2020-02-07 NOTE — Progress Notes (Signed)
Refills on pain medications

## 2020-02-07 NOTE — Discharge Planning (Signed)
RNCM notes frequent visits to ED.  RNCM contacted PCP office ([facility]) for follow-up appointment. Pt has appointment scheduled for 11/16 @ 1000.  RNCM placed information on AVS for pt convenience.

## 2020-02-07 NOTE — Telephone Encounter (Signed)
Copied from [place] [id number]. Topic: General - Other >> Feb 07, 2020  8:35 AM Juan Hester wrote: Reason for CRM: Pt called to speak with Christa See about his treatments / please advise

## 2020-02-07 NOTE — ED Notes (Addendum)
Wasted 72mg Fentanyl in sharps with [employer]ator

## 2020-02-07 NOTE — ED Notes (Signed)
Pt becoming agitated, stating he will rip out his IV and everything else connected. Pt brought new towels and basin for his colostomy. PA aware pt would like to speak to him.

## 2020-02-07 NOTE — ED Notes (Signed)
Patient verbalized understanding of discharge instructions. Opportunity for questions and answers.  

## 2020-02-07 NOTE — Discharge Instructions (Addendum)
You have been seen here for a cough.  Lab work and imaging looks reassuring.  I prescribed you potassium pills please take as prescribed.  I want you to follow-up with your PCP in 5 days time for to recheck your potassium and magnesium.  Come back to the emergency department if you develop chest pain, shortness of breath, severe abdominal pain, uncontrolled nausea, vomiting, diarrhea.

## 2020-02-07 NOTE — ED Notes (Signed)
Wasted 50 mcg in waste bin of fentanyl with Crystal,RN. Also witnessed waste of 50 mcg of fentanyl of Crystal,RN.

## 2020-02-07 NOTE — ED Notes (Addendum)
Witnessed the waste of 19mg Fentanyl in sharps with [employer]

## 2020-02-07 NOTE — ED Notes (Signed)
Pt provided with colostomy supplies

## 2020-02-07 NOTE — ED Provider Notes (Signed)
[facility] EMERGENCY DEPARTMENT Provider Note   CSN: [id number] Arrival date & time: 02/07/20  1003     History Chief Complaint  Patient presents with  . Cough    Juan Hester is a 39 y.o. male.  HPI   Patient with significant medical history of colon cancer, foot drop right, MI, PE currently on Xarelto presents to the emergency department with chief complaint of coughing fit and chest pain.  Patient states he is notice worsening coughing fits over the last 4 days.  He states he gets these fits for which will last for 5 to 10 minutes.  He produces some weight-like substances and states he has shortness of breath and chest pain during these times.  Patient denies headache, fever, chills, abdominal pain, nausea, vomiting or diarrhea.  He states he is a smoker and has been unable to smoke due to his coughing fit.  He was diagnosed with a PE in February and was started on Xarelto.  He decided 3 weeks ago that he will stop taking it because he feels that it has been long enough.  Patient denies any alleviating or aggravating factors at this time.  Patient denies headache, fever, chills, abdominal pain, nausea, vomiting, diarrhea, worsening pedal edema.  Past Medical History:  Diagnosis Date  . Acute renal failure ([place]) 12/16/2019  . Anxiety    per pt report  . Colon cancer ([place])   . Crohn disease ([place])   . Depression    per pt report  . Foot drop, right   . Myocardial infarction ([place])   . PE (pulmonary thromboembolism) ([place])   . PTSD (post-traumatic stress disorder)   . Thyroid disease     Patient Active Problem List   Diagnosis Date Noted  . Chronic pain syndrome 01/31/2020  . Homelessness 12/27/2019  . Colostomy status ([place]) 12/16/2019  . History of pulmonary embolism 12/16/2019  . Nausea and vomiting 12/16/2019  . Right foot drop 12/15/2019  . GAD (generalized anxiety disorder) 10/20/2019  . MDD (major depressive disorder) 10/20/2019  . PTSD  (post-traumatic stress disorder) 10/20/2019  . Lumbar radiculopathy 10/19/2019  . Bipolar affective disorder, currently depressed, mild ([place]) 10/16/2019  . ETOH abuse 10/16/2019  . Protrusion of lumbar intervertebral disc 10/05/2019  . Multiple subsegmental pulmonary emboli without acute cor pulmonale ([place]) 07/21/2019  . Bipolar 1 disorder ([place]) 07/21/2019  . History of colon cancer 07/21/2019  . Schizoaffective disorder, bipolar type ([place]) 06/03/2019  . Nicotine dependence, cigarettes, uncomplicated 36/46/8032  . Crohn's disease with complication ([place]) 04/07/8249    Past Surgical History:  Procedure Laterality Date  . ABDOMINAL SURGERY    . COLON SURGERY    . IR RADIOLOGIST EVAL & MGMT  08/31/2019  . OSTOMY    . SMALL INTESTINE SURGERY         No family history on file.  Social History   Tobacco Use  . Smoking status: Current Every Day Smoker    Packs/day: 1.00  . Smokeless tobacco: Never Used  Vaping Use  . Vaping Use: Never used  Substance Use Topics  . Alcohol use: Yes  . Drug use: Not Currently    Home Medications Prior to Admission medications   Medication Sig Start Date End Date Taking? Authorizing Provider  acetaminophen (TYLENOL) 325 MG tablet Take 2 tablets (650 mg total) by mouth every 6 (six) hours as needed for mild pain (or Fever >/= 101). Patient taking differently: Take 650 mg by mouth every 6 (six) hours  as needed for mild pain or moderate pain.  12/20/19  Yes Domenic Polite, MD  clonazePAM (KLONOPIN) 0.5 MG tablet Take 0.5 tablets (0.25 mg total) by mouth 2 (two) times daily. 12/29/19  Yes [name], MD  cyclobenzaprine (FLEXERIL) 10 MG tablet Take 1 tablet (10 mg total) by mouth 2 (two) times daily as needed for muscle spasms. 01/16/20  Yes [name], MD  diphenoxylate-atropine (LOMOTIL) 2.5-0.025 MG tablet Take 1 tablet by mouth 4 (four) times daily. 01/31/20  Yes [name], MD  divalproex (DEPAKOTE) 500 MG DR tablet Take 1 tablet  (500 mg total) by mouth 2 (two) times daily. 12/29/19  Yes [name], MD  DULoxetine (CYMBALTA) 60 MG capsule Take 1 capsule (60 mg total) by mouth daily. 12/29/19  Yes [name], MD  gabapentin (NEURONTIN) 300 MG capsule Take 2 capsules (600 mg total) by mouth 3 (three) times daily. 01/29/20  Yes [name], MD  naloxone [facility]) nasal spray 4 mg/0.1 mL Use as needed for overdose symptoms from opiate Patient taking differently: Place 0.4 mg into the nose See admin instructions. Use as needed for overdose symptoms from opiate 10/19/19  Yes [name], MD  oxyCODONE (ROXICODONE) 5 MG immediate release tablet Take 2 tablets (10 mg total) by mouth every 6 (six) hours as needed for severe pain. 02/07/20  Yes [name], MD  QUEtiapine (SEROQUEL) 300 MG tablet Take 1 tablet (300 mg total) by mouth at bedtime. Patient taking differently: Take 350 mg by mouth See admin instructions. Take 1 tablet (300 mg) combine with tablet (50 mg) to make 350 mg totally by mouth at bedtime 12/29/19  Yes [name], MD  QUEtiapine (SEROQUEL) 50 MG tablet Take 1 tablet (50 mg total) by mouth 2 (two) times daily. Patient taking differently: Take 50 mg by mouth every morning.  12/29/19  Yes [name], MD  rivaroxaban (XARELTO) 20 MG TABS tablet Take 1 tablet (20 mg total) by mouth daily with supper. 12/15/19  Yes [name], MD  simethicone (MYLICON) 80 MG chewable tablet Chew 80 mg by mouth every 6 (six) hours as needed for flatulence.   Yes [provider]  lidocaine (LIDODERM) 5 % Place 1 patch onto the skin daily. Remove & Discard patch within 12 hours or as directed by MD Patient not taking: Reported on 01/31/2020 01/20/20   Chase Picket, MD  potassium chloride (KLOR-CON) 10 MEQ tablet Take 1 tablet (10 mEq total) by mouth daily for 5 days. 02/07/20 02/12/20  Marcello Fennel, PA-C    Allergies    Azathioprine, Ciprofloxacin, Mesalamine, Metronidazole,  Sulfa antibiotics, Barium sulfate, and Barium-containing compounds  Review of Systems   Review of Systems  Constitutional: Negative for chills and fever.  HENT: Negative for congestion, tinnitus, trouble swallowing and voice change.   Eyes: Negative for visual disturbance.  Respiratory: Positive for cough and shortness of breath.   Cardiovascular: Positive for chest pain.  Gastrointestinal: Negative for abdominal pain, diarrhea, nausea and vomiting.  Genitourinary: Negative for enuresis, flank pain and frequency.  Musculoskeletal: Negative for back pain.  Skin: Negative for rash.  Neurological: Negative for dizziness and headaches.  Hematological: Does not bruise/bleed easily.    Physical Exam Updated Vital Signs BP 123/87 (BP Location: Right Arm)   Pulse 88   Temp 97.9 F (36.6 C) (Oral)   Resp 15   Ht 6' 1"  (1.854 m)   Wt 114 kg  SpO2 (!) 15%   BMI 33.16 kg/m   Physical Exam Vitals and nursing note reviewed.  Constitutional:      General: He is not in acute distress.    Appearance: He is not ill-appearing.  HENT:     Head: Normocephalic and atraumatic.     Nose: No congestion.     Mouth/Throat:     Mouth: Mucous membranes are moist.     Pharynx: Oropharynx is clear.  Eyes:     General: No scleral icterus. Cardiovascular:     Rate and Rhythm: Normal rate and regular rhythm.     Pulses: Normal pulses.     Heart sounds: No murmur heard.  No friction rub. No gallop.   Pulmonary:     Effort: No respiratory distress.     Breath sounds: No wheezing, rhonchi or rales.  Abdominal:     General: There is no distension.     Tenderness: There is no abdominal tenderness. There is no right CVA tenderness, left CVA tenderness or guarding.     Comments: Patient's abdomen was visualized, he had a colostomy bag noted in the left lower quadrant, stoma was red, no signs of infection noted, patient had liquid like stool noted in the bag. Abdomen was nondistended, normoactive bowel  sounds, dull to percussion, no acute abdomen noted on exam.  Musculoskeletal:        General: No swelling.     Right lower leg: No edema.     Left lower leg: No edema.  Skin:    General: Skin is warm and dry.     Capillary Refill: Capillary refill takes less than 2 seconds.     Findings: No rash.  Neurological:     Mental Status: He is alert.  Psychiatric:        Mood and Affect: Mood normal.     ED Results / Procedures / Treatments   Labs (all labs ordered are listed, but only abnormal results are displayed) Labs Reviewed  COMPREHENSIVE METABOLIC PANEL - Abnormal; Notable for the following components:      Result Value   Potassium 2.8 (*)    Glucose, Bld 104 (*)    Calcium 8.5 (*)    AST 49 (*)    ALT 60 (*)    All other components within normal limits  CBC WITH DIFFERENTIAL/PLATELET - Abnormal; Notable for the following components:   RBC 3.83 (*)    Hemoglobin 12.8 (*)    HCT 37.7 (*)    RDW 16.5 (*)    All other components within normal limits  URINALYSIS, ROUTINE W REFLEX MICROSCOPIC - Abnormal; Notable for the following components:   Specific Gravity, Urine >1.046 (*)    All other components within normal limits  MAGNESIUM - Abnormal; Notable for the following components:   Magnesium 1.6 (*)    All other components within normal limits  RESPIRATORY PANEL BY RT PCR (FLU A&B, COVID)  TROPONIN I (HIGH SENSITIVITY)  TROPONIN I (HIGH SENSITIVITY)    EKG EKG Interpretation  Date/Time:  Monday February 07 2020 10:17:42 EDT Ventricular Rate:  101 PR Interval:    QRS Duration: 93 QT Interval:  364 QTC Calculation: 472 R Axis:   0 Text Interpretation: Sinus tachycardia no sig change from previous Confirmed by Charlesetta Shanks [phone]) on 02/07/2020 2:12:34 PM   Radiology DG Chest 2 View  Result Date: 02/07/2020 CLINICAL DATA:  Chest pain. EXAM: CHEST - 2 VIEW COMPARISON:  Aug 25, 2019. FINDINGS: The heart size  and mediastinal contours are within normal limits.  Both lungs are clear. No pneumothorax or pleural effusion is noted. Left subclavian Port-A-Cath is unchanged in position. The visualized skeletal structures are unremarkable. IMPRESSION: No active cardiopulmonary disease. Electronically Signed   By: Marijo Conception M.D.   On: 02/07/2020 13:05   CT Angio Chest PE W and/or Wo Contrast  Result Date: 02/07/2020 CLINICAL DATA:  Shortness of breath. EXAM: CT ANGIOGRAPHY CHEST WITH CONTRAST TECHNIQUE: Multidetector CT imaging of the chest was performed using the standard protocol during bolus administration of intravenous contrast. Multiplanar CT image reconstructions and MIPs were obtained to evaluate the vascular anatomy. CONTRAST:  61m OMNIPAQUE IOHEXOL 350 MG/ML SOLN COMPARISON:  July 11, 2019 FINDINGS: Cardiovascular: A left-sided venous Port-A-Cath is seen. Satisfactory opacification of the pulmonary arteries to the segmental level. No evidence of pulmonary embolism. Normal heart size. No pericardial effusion. Mediastinum/Nodes: No enlarged mediastinal, hilar, or axillary lymph nodes. Thyroid gland, trachea, and esophagus demonstrate no significant findings. Lungs/Pleura: Lungs are clear. No pleural effusion or pneumothorax. Upper Abdomen: No acute abnormality. Musculoskeletal: No chest wall abnormality. No acute or significant osseous findings. Review of the MIP images confirms the above findings. IMPRESSION: 1. No CT evidence of pulmonary embolism or acute cardiopulmonary disease. Electronically Signed   By: TVirgina NorfolkM.D.   On: 02/07/2020 15:17    Procedures Procedures (including critical care time)  Medications Ordered in ED Medications  potassium chloride 10 mEq in 100 mL IVPB (10 mEq Intravenous New Bag/Given 02/07/20 1457)  potassium chloride SA (KLOR-CON) CR tablet 40 mEq (40 mEq Oral Given 02/07/20 1349)  fentaNYL (SUBLIMAZE) injection 50 mcg (50 mcg Intravenous Given 02/07/20 1349)  alum & mag hydroxide-simeth (MAALOX/MYLANTA)  200-200-20 MG/5ML suspension 30 mL (30 mLs Oral Given 02/07/20 1519)    And  lidocaine (XYLOCAINE) 2 % viscous mouth solution 15 mL (15 mLs Oral Given 02/07/20 1519)  fentaNYL (SUBLIMAZE) injection 50 mcg (50 mcg Intravenous Given 02/07/20 1520)  iohexol (OMNIPAQUE) 350 MG/ML injection 75 mL (75 mLs Intravenous Contrast Given 02/07/20 1511)    ED Course  I have reviewed the triage vital signs and the nursing notes.  Pertinent labs & imaging results that were available during my care of the patient were reviewed by me and considered in my medical decision making (see chart for details).    MDM Rules/Calculators/A&P                          I have personally reviewed all imaging, labs and have interpreted them.  Patient presents with coughing fit shortness of breath and chest pain.  He is alert, did not appear in acute distress, vital signs reassuring.  Will order chest pain work-up, EKG, chest x-ray and reevaluate.  CBC negative for leukocytosis, normocytic anemia at 12.8 appears to be a baseline for patient.  CMP showing hypokalemia of 2.8, hyperglycemia 104, slightly elevated liver enzymes, no anion gap noted.  Magnesium 1.6.  Will provide patient with oral and IV potassium as well as oral magnesium.  Patient had a negative delta troponin.  Respiratory panel negative for Covid, influenza A/B.  CT angio does not reveal any acute findings, chest x-ray was negative for acute findings.  EKG sinus tach without signs of ischemia no ST elevation depression noted.  I have low suspicion for ACS or acute cardiac abnormality as history is atypical of ACS as he complains of chest pain only during coughing, denies shortness of breath,  there is no signs of fluid overload or signs of hypoperfusion noted on exam.  He had a negative delta troponin, EKG showing no signs of ischemia.  Low suspicion for PE as CT scan does not show any acute findings.  Low suspicion for systemic infection as patient is  nontoxic-appearing, vital signs reassuring, no obvious source of infection noted.  Patient had negative respiratory panel.  Low suspicion for intra-abdominal abnormality like small bowel obstruction, hepatic or biliary issue, as patient had no abdominal pain upon palpation, he is tolerating p.o. without any difficulty.  I suspect patient's electrolyte derailment is secondary from his Crohn's disease.  Will prescribe oral potassium have him follow-up with his PCP for further evaluation.  Vital signs have remained stable, no indication for hospital admission.  Patient discussed with attending and they agreed with assessment and plan.  Patient given at home care as well strict return precautions.  Patient verbalized that they understood agreed to said plan.        Final Clinical Impression(s) / ED Diagnoses Final diagnoses:  Cough  Hypokalemia  Hypomagnesemia    Rx / DC Orders ED Discharge Orders         Ordered    potassium chloride (KLOR-CON) 10 MEQ tablet  Daily        02/07/20 1530           Aron Baba 02/07/20 1603    Charlesetta Shanks, MD 02/08/20 1145

## 2020-02-07 NOTE — Telephone Encounter (Signed)
Calls with severe coughing, very little phlegm. SOB for 3 days and worsened overnight. Dizziness with coughing. Reports having PE's but has not taken blood thinner for 3 weeks. Why-tired of taking pills. Frothy stool in colostomy bag. He can barely talk/listen for coughing. Denies CP and fever. Advised calling EMS at this time as he sounds like he is struggling to breathe. Feels like he will pass out at times. Stated he will call 911 now. Routing to physician as Juluis Rainier.  Reason for Disposition . [1] MODERATE difficulty breathing (e.g., speaks in phrases, SOB even at rest, pulse 100-120) AND [2] NEW-onset or WORSE than normal  Answer Assessment - Initial Assessment Questions 1. RESPIRATORY STATUS: "Describe your breathing?" (e.g., wheezing, shortness of breath, unable to speak, severe coughing)     Cough, shortness of breath, almost unable to talk. 2. ONSET: "When did this breathing problem begin?"      3 days ago but worsened over night. 3. PATTERN "Does the difficult breathing come and go, or has it been constant since it started?"      Eases up at times 4. SEVERITY: "How bad is your breathing?" (e.g., mild, moderate, severe)    - MILD: No SOB at rest, mild SOB with walking, speaks normally in sentences, can lay down, no retractions, pulse < 100.    - MODERATE: SOB at rest, SOB with minimal exertion and prefers to sit, cannot lie down flat, speaks in phrases, mild retractions, audible wheezing, pulse 100-120.    - SEVERE: Very SOB at rest, speaks in single words, struggling to breathe, sitting hunched forward, retractions, pulse > 120      Patient can hardly talk or listen due to severe coughing 5. RECURRENT SYMPTOM: "Have you had difficulty breathing before?" If Yes, ask: "When was the last time?" and "What happened that time?"     Has pulmonary emboli at this time 6. CARDIAC HISTORY: "Do you have any history of heart disease?" (e.g., heart attack, angina, bypass surgery, angioplasty)      unsure 7. LUNG HISTORY: "Do you have any history of lung disease?"  (e.g., pulmonary embolus, asthma, emphysema)     Pulmonary embolus suppose to be on medication but not taking 8. CAUSE: "What do you think is causing the breathing problem?"      unsure 9. OTHER SYMPTOMS: "Do you have any other symptoms? (e.g., dizziness, runny nose, cough, chest pain, fever)     Dizziness, cough. 10. PREGNANCY: "Is there any chance you are pregnant?" "When was your last menstrual period?"       na 11. TRAVEL: "Have you traveled out of the country in the last month?" (e.g., travel history, exposures)       no  Protocols used: BREATHING DIFFICULTY-A-AH

## 2020-02-08 DIAGNOSIS — M5416 Radiculopathy, lumbar region: Secondary | ICD-10-CM

## 2020-02-08 LAB — DRUG SCREEN 764883 11+OXYCO+ALC+CRT-BUND
Amphetamines, Urine: NEGATIVE ng/mL
BENZODIAZ UR QL: NEGATIVE ng/mL
Barbiturate: NEGATIVE ng/mL
Cocaine (Metabolite): NEGATIVE ng/mL
Creatinine: 286.9 mg/dL (ref 20.0–300.0)
Ethanol: NEGATIVE %
Meperidine: NEGATIVE ng/mL
Methadone Screen, Urine: NEGATIVE ng/mL
OPIATE SCREEN URINE: NEGATIVE ng/mL
Phencyclidine: NEGATIVE ng/mL
Propoxyphene: NEGATIVE ng/mL
Tramadol: NEGATIVE ng/mL
pH, Urine: 5.8 (ref 4.5–8.9)

## 2020-02-08 LAB — CANNABINOID CONFIRMATION, UR
CANNABINOIDS: POSITIVE — AB
Carboxy THC GC/MS Conf: 64 ng/mL

## 2020-02-08 LAB — OXYCODONE/OXYMORPHONE, CONFIRM
OXYCODONE/OXYMORPH: POSITIVE — AB
OXYCODONE: 1283 ng/mL
OXYCODONE: POSITIVE — AB
OXYMORPHONE (GC/MS): 1701 ng/mL
OXYMORPHONE: POSITIVE — AB

## 2020-02-08 NOTE — Telephone Encounter (Signed)
I talked to this patient by phone and reviewed with him findings from the recent ER visit.  I told him his CT angio was negative for any pulmonary abnormalities or pulmonary emboli.  He did not let me know he is not been taking Xarelto for many months now.  He has been taking oral cannabis.  He still complains of coughing up small amounts of blood he showed pictures of this they look more like dark blood and also some fresh blood from the naris.  Plan is to get him back with physical therapy for his back he is to keep his  appointment with neurosurgery for evaluation of his lower back and I have documented he is discontinue the Xarelto

## 2020-02-11 ENCOUNTER — Other Ambulatory Visit: Payer: Self-pay

## 2020-02-11 ENCOUNTER — Other Ambulatory Visit: Payer: Self-pay | Admitting: Critical Care Medicine

## 2020-02-11 MED ORDER — OXYCODONE HCL 15 MG PO TABS
15.0000 mg | ORAL_TABLET | Freq: Four times a day (QID) | ORAL | 0 refills | Status: DC | PRN
Start: 2020-02-11 — End: 2020-02-16

## 2020-02-11 NOTE — Telephone Encounter (Signed)
Return call placed to patient. LCSW left message requesting a return call.

## 2020-02-11 NOTE — Telephone Encounter (Signed)
Requested medication (s) are due for refill today: no  Requested medication (s) are on the active medication list: yes   Last refill: 02/07/2020  Future visit scheduled: yes   Notes to clinic:  this refill cannot be delegated   Requested Prescriptions  Pending Prescriptions Disp Refills   oxyCODONE (ROXICODONE) 5 MG immediate release tablet 40 tablet 0    Sig: Take 2 tablets (10 mg total) by mouth every 6 (six) hours as needed for severe pain.      Not Delegated - Analgesics:  Opioid Agonists Failed - 02/11/2020  8:30 AM      Failed - This refill cannot be delegated      Passed - Urine Drug Screen completed in last 360 days.      Passed - Valid encounter within last 6 months    Recent Outpatient Visits           1 week ago Lumbar radiculopathy   [place] [name], MD   1 month ago Acute renal failure, unspecified acute renal failure type [facility])   [place] [name], MD   1 month ago Lumbar radiculopathy   [place] [name], MD   2 months ago Lumbar radiculopathy   [place] [name], MD   3 months ago GAD (generalized anxiety disorder)   [place], MD       Future Appointments             In 2 weeks [name], MD [place]

## 2020-02-11 NOTE — Telephone Encounter (Signed)
Pt is calling and would like a refill on oxycodone 5 mg. Pt has an appt with neurosurgeon on 03-13-2020. Pt would like the refill [facility] go [facility]. Pt would like a callback once rx has been called in .

## 2020-02-16 ENCOUNTER — Other Ambulatory Visit: Payer: Self-pay | Admitting: Critical Care Medicine

## 2020-02-16 MED ORDER — OXYCODONE HCL 15 MG PO TABS
15.0000 mg | ORAL_TABLET | Freq: Four times a day (QID) | ORAL | 0 refills | Status: DC | PRN
Start: 2020-02-16 — End: 2020-02-21

## 2020-02-21 ENCOUNTER — Other Ambulatory Visit: Payer: Self-pay | Admitting: Critical Care Medicine

## 2020-02-21 ENCOUNTER — Telehealth: Payer: Self-pay | Admitting: Critical Care Medicine

## 2020-02-21 MED ORDER — OXYCODONE HCL 15 MG PO TABS
15.0000 mg | ORAL_TABLET | Freq: Four times a day (QID) | ORAL | 0 refills | Status: DC | PRN
Start: 2020-02-21 — End: 2020-03-02

## 2020-02-21 MED ORDER — OXYCODONE HCL 15 MG PO TABS
15.0000 mg | ORAL_TABLET | Freq: Four times a day (QID) | ORAL | 0 refills | Status: DC | PRN
Start: 2020-02-21 — End: 2020-02-21

## 2020-02-21 NOTE — Telephone Encounter (Signed)
FYI- [facility] reported that they did not fill script there today because pt just received a 10-day supply from there on 02/16/20 so they refused to fill early,  cancelled script sent today

## 2020-02-21 NOTE — Telephone Encounter (Signed)
Patient called saying [facility] is not able to fill his  oxyCODONE (ROXICODONE) 15 MG immediate release tablet Patient would like Rx sent to [facility] on [address].

## 2020-02-21 NOTE — Progress Notes (Signed)
Med refill

## 2020-02-21 NOTE — Telephone Encounter (Signed)
Medication: oxyCODONE (ROXICODONE) 15 MG immediate release tablet [[id number]] - Pharmacy is out of medication. Can this be sent to the pharmacy below please?  Has the patient contacted their pharmacy? YES (Agent: If no, request that the patient contact the pharmacy for the refill.) (Agent: If yes, when and what did the pharmacy advise?)    [place], [place]. [address] [phone]  Agent: Please be advised that RX refills may take up to 3 business days. We ask that you follow-up with your pharmacy.

## 2020-02-21 NOTE — Telephone Encounter (Signed)
Refill sent to [facility].    pls call the [facility] to cancel that Rx

## 2020-02-21 NOTE — Telephone Encounter (Signed)
Rx sent to pharmacy   

## 2020-02-21 NOTE — Telephone Encounter (Signed)
Per initiital encounter, Medication: oxyCODONE (ROXICODONE) 15 MG immediate release tablet [[id number]] - Pharmacy is out of medication. Can this be sent to the pharmacy below please?  Has the patient contacted their pharmacy? YES (Agent: If no, request that the patient contact the pharmacy for the refill.) (Agent: If yes, when and what did the pharmacy advise?)    [facility] [place], [place]. [address] [phone]  Agent: Please be advised that RX refills may take up to 3 business days. We ask that you follow-up with your pharmacy.  Script was originally sent to [facility] route to office for final disposition

## 2020-02-22 ENCOUNTER — Other Ambulatory Visit: Payer: Self-pay | Admitting: Critical Care Medicine

## 2020-02-22 ENCOUNTER — Telehealth: Payer: Self-pay | Admitting: Critical Care Medicine

## 2020-02-22 MED ORDER — PREDNISONE 10 MG PO TABS: ORAL_TABLET | ORAL | 0 refills | Status: DC

## 2020-02-22 NOTE — Telephone Encounter (Signed)
Pt called and asked if I could get Dr Joya Gaskins on the phone.  Advised pt I could send him a message, but the office is not open until 8:30.  Pt was very persistent.  He says I just talked with him on mychart so I know he isn't doing anything, I know he is there.  I will hold if you while you get him on the phone.  Pt states I will call him on his cell phone if I have to.  I advised pt that I could not call Dr Joya Gaskins, but I will send the message.   Pt verbalized understanding, but was not happy with me.

## 2020-02-22 NOTE — Telephone Encounter (Signed)
I have already spoken to this patient at 835am today

## 2020-02-22 NOTE — Telephone Encounter (Signed)
I have already spoken to the patient this am

## 2020-02-22 NOTE — Telephone Encounter (Signed)
noted 

## 2020-02-22 NOTE — Telephone Encounter (Signed)
Copied from [place] [id number]. Topic: General - Inquiry >> Feb 22, 2020  8:21 AM Juan Hester wrote: Reason for CRM: Pt is calling and would like dr Joya Gaskins to call him concerning his appt on 02-29-2020 and his pain management

## 2020-02-29 ENCOUNTER — Ambulatory Visit: Payer: Self-pay | Admitting: Critical Care Medicine

## 2020-02-29 NOTE — Progress Notes (Deleted)
Subjective:    Patient ID: Juan Hester, male    DOB: [birth year], 39 y.o.   MRN: [id number] History of Present Illness:  First OV 07/21/19 This is a complex 39 year old male who just arrived here from the [place] area and currently is living with relatives who presents for evaluation of pulmonary embolism history, Crohn's disease, history of colon carcinoma status post sigmoid resection with colostomy formation, and need for paperwork regarding patient's work status.  The patient currently is living in a hotel.  Patient arrived here in December.  The patient in [place] was under care by gastroenterologist was on Remicade for Crohn's disease and also being followed for his oncology status.  He is due for a colonoscopy at this time.  He works at [employer] he has history of bipolar disorder and had previously been on Cogentin Zyprexa, Depakote and Cymbalta.  Patient is also had major depression as well in the past.  The patient states he has significant volume output from his colostomy with a diarrheal type syndrome and he does have Imodium for this.  The patient has abdominal pain at times.  He has chronic fatigue.  He cannot stand and one place for prolonged periods of time.  He fell at work and his lower back has been difficult for him since that fall.  He was in the emergency room and underwent CT scan of the head and this was negative.  The patient is needing refills on his medications and he does not have insurance at this time.  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  The patient states his back pain is worsening.  He did fall at work and March.  He states the pain was tolerable but now markedly  worse if he stands for long periods of time.  He was not able to use a Lidoderm patch as he cannot reach the center part of his back.  He notes pain shooting down both legs.  He has some difficulty walking as well at this time.  There has not been any lumbar imaging studies done although when he went to the emergency room previously there was a CT scan of the abdomen was done and did not show changes in the lumbar spine but this was a limited study  Patient states his cyclobenzaprine is not useful but he is not taking it on a regular basis The patient has no change in his breathing status and is maintaining his anticoagulant for history of pulmonary emboli.  The patient is yet to achieve a gastroenterology appointment as he needs the orange card first.  He has his financial paperwork together so make an appointment for the financial counselor  08/18/2019 The patient comes in with lower extremity edema and is just gone to the ER 1 day ago for progressive back pain which occurred on the job.  He has since been let go from the job he was working.  He has another job as well he works.  The patient is yet to receive his lumbar x-rays as ordered.  He does not have financial assistance as of yet to be able to achieve an MRI or orthopedics appointment.  Patient notes increased edema in the lower extremities and some shortness of breath with exertion. Note I reviewed his CT scan of the chest March which did not show pulmonary emboli but did show atelectasis at the bases of the lungs.   Wt Readings from Last 3 Encounters: 08/18/19 : 274 lb (124.3 kg) 07/21/19 : 264 lb 3.2 oz (119.8 kg) 07/11/19 : 260 lb (117.9 kg)  Patient cannot sleep flat is still smoking half pack a day of cigarettes denies wheezing slight productive cough of green mucus no sinus complaints  09/01/2019 This patient is seen in return follow-up and has been to the emergency room twice since I last saw him several weeks ago.  Patient has  continued pain in the lower back.  He has a normal lumbar study.  He had fallen at work.  He is now been fired from his job.  He has retained an attorney for [employer].  The patient states his pain is more in the upper thoracic area as well as in the lumbar area at this time.  Note he also went to interventional radiology they recommend not starting in this patient with an in fear vena cava filter but instead study his lower extremities first.  He is yet to have a vascular study the lower extremities for DVT.  He is now off Xarelto because he was bleeding from an opening of the rectal closure from his previous total colectomy.  The patient still has some difficulty in that area but the bleeding is less off the Xarelto.  Patient is also seeing gastroenterology and they are awaiting further evaluation of the rectal tear.  The patient is yet to see a surgeon for this.  He is here today for follow-up.  10/19/2019 This patient is seen in return follow-up and notes continued low back pain with radiculopathy down the left lower extremity.  He saw orthopedics and they recommended an injection however they said that they could not perform it under his Workmen's Comp. agreement.  He is unable to afford any type of procedure as he is self-pay at this time.  He is also wishing a second opinion on his back.  In addition to this he has had increased depression and mood swings and this ultimately resulted in increased alcohol use when he was with a friend over the weekend and when the friend left him he became extremely despondent and started thinking of suicidal ideation.  He called for help and was brought to the emergency room where he had a 36-hour stay in involuntary commitment until he can be cleared by psychiatry which did occurred he was just discharged 24 hours ago.  He was given a paper prescription for Seroquel and also for his Depakote however he cannot afford these medications at this time.  He  was also not given a refill on Cymbalta and has run out of the Cymbalta.  He also has run out of his oxycodone as well for his back pain.  He does have a supply of Xarelto and maintains this 20 mg daily without any additional bleeding noted in the rectal area.  When he came to the ER he was very much confused had been drinking excessively and somewhat altered he did clear this out and he has not had further drinking since that time.  He states when he uses the oxycodone he only takes it about 3-4 times daily.  He states the cyclobenzaprine was of no use to him.  He  has had several falls because of left-sided weakness in the lower extremity.  He feels like there is electric shock going down his leg and this resulted in the falls.  He has bruised his left shoulder with an injury and a fall in his bathroom. He was unable to obtain transportation therefore this visit was done via video Note the psychiatry doctor did discontinue his Zyprexa in favor of use of Seroquel 200 mg daily.  He does have follow-up with a behavioral therapist and psychiatrist in the next 24 hours [facility].  11/01/2019 This is a video visit and follow-up from her previous video visit several weeks ago.  The patient attempted to get to see neurosurgery but they were going to charge her co-pay he was not able to achieve that visit.  The patient is no longer drinking alcohol regularly.  He still has significant anxiety.  He did see the mental health providers at the clinic and they prescribed hydroxyzine for anxiety and this has not helped his symptoms.  He is wishing to get back on the some type of benzodiazepine.  He is seeing a Social worker at this time.  He denies any bleeding from the rectal area.  He still has low back pain.  He is using the oxycodone as needed for pain and only minimally using this.  He has called his lawyer deceiving get Workmen's Comp. to cover the neurosurgical  evaluation.  12/02/2019 I attempted to have a video visit with this patient he was in his hotel and appeared to be under some influence.  He could not focus or listen properly to the interaction.  He was stating he was having new onset shoulder pain and a cough.  He states his back is still tender.  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  He is trying to save up his money so that he can undergo spinal evaluations.  He is on a fairly high dose of oxycodone and this will need to be reduced.  I discussed with him the need and the importance of changing his pain medication.  He took this under advisement.  It was difficult to proceed with any other elements of this visit so I told the patient we would follow-up with him in the office setting  12/15/2019 This patient returns in follow-up and has severe pain and now is developed foot drop on the right lower extremity.  He has yet to make his orthopedic appointment.  He has lost 35 pounds in weight.  He has a sore on the dorsal aspect of his right foot with surrounding erythema that he has noticed.  His pain is worse in the lower back area.  Note he does have significant lumbar disease on the MRI we previously have obtained.  He is out of his pain medications at this time.  I explained to him in order to maintain him on pain medicine we need to have him come in for regular face-to-face visit so that we can obtain a urine drug screen.  He says he is not using other substances.  He does state that his mental health became a little unbalanced when he stopped taking some of his medicines but now is back on his mental health medications He states on the Xarelto he occasionally has some blood from his colostomy area and rectal areas  The patient says the foot drop occurred several weeks ago and he had to crawl about  his apartment and developed sores on his feet and knees he says he is able to walk better now it suddenly changed  12/27/2019 This patient  seen back in return follow-up after hospitalization between the second and 6 September.  During the hospitalization was found to have acute renal failure from severe volume depletion related to high colostomy output.  There was no evidence of active infection.  The patient had electrolyte derangements and elevated creatinine.  He was given IV fluids and told to follow-up.  He states since he has been home he still remains weak but he is improved compared to before.  He has less nausea.  He does have some black stools coming out of the ostomy.  He notes if he talks a lot his throat will gag and feel dry.  His pain in his lower back is not a big issue as it is in the lower extremity and will on the left. Below is a copy of the discharge summary  Admit date: 12/16/2019 Discharge date: 12/20/2019  Time spent: 35 minutes  Recommendations for Outpatient Follow-up:  1. PCP Dr. Joya Gaskins in 1 week, consider weaning narcotics, please check BMP in 1 week, emphasized compliance with Lomotil    Discharge Diagnoses:  Principal Problem:   Acute renal failure ([place]) High colostomy output   Crohn's disease with complication ([place])   History of colon cancer   ETOH abuse   Schizoaffective disorder, bipolar type ([place])   Abscess or cellulitis of foot   Colostomy status ([place])   History of pulmonary embolism   Nausea and vomiting   Discharge Condition: Stable  Diet recommendation: Regular  Filed Weights  12/16/19 2113 12/18/19 0402 Weight: 115.7 kg 118.1 kg   History of present illness:  Juan Hester a 39/M wCrohn's disease, colon cancer s/p L hemicolectomy and ostomy, prior multifocal PEs on Xarelto, schizoaffective disorder, alcohol use recent L4-5 injury with right foot drop. Patient was sent to the ED on 12/16/2019 from PCPs office for abnormal labs. 10 days prior to presentation, patient started 'fasting for spiritual reasons' where he did not eat anything but drank plenty of fluids. In  about a week, he started having bilious vomiting. He gave up fasting but could not tolerate any food. 9/1, pt went to PCPs office for right foot wound. Routine labs showed elevated creatinine and hence patient was sent to the ED. Also reports chronic freq stools/ostomy output>10times/day at baseline, supposed to be on lomotilbut has not taken this in the past few weeks. In the ED, hemodynamically stable Labs noted creatinine elevated to 3.48, baseline 1.2, lipase elevated to 278, LFT normal. CT scan of abdomen pelvis did not show any acute abnormality  Hospital Course:   Acute kidney injury  -Admitted with creatinine of 3.5 up from baseline of 1.2  -due to dehydration related to poor oral intake and chronic GI losses, has high output from his colostomy and daily naproxen use -Hydrated with normal saline, Lomotil added 4 times daily -Kidney function improving, creatinine down to 1.9 today -Follow-up with Dr. Joya Gaskins in 1 week, check BMP at follow-up  H/o PE -Xarelto held in the setting of AKI and was briefly on a heparin bridge instead, since kidney function is improving discussed with Pharm.D. will restart Xarelto at discharge  Hypomagnesemia, hypokalemia -Replaced  Intractable nausea vomiting -Resolved, -CT abdomen pelvis was unremarkable  Right foot scab -No evidence of secondary infection, does not need antibiotics for it  at this time  History of Crohn's disease,  colon cancer s/p colectomy -No evidence of obstruction CT abdomen pelvis. It showed some parasternal hernia and mild ileus likely chronic. -Ostomy nurse consulted -Follow-up with gastroenterology, patient reports that he is unable to do so since he does not have Medicaid yet in [place]  Recent L4-L5 injury with right foot drop -Patient walks with a walker. -On pain control with oxycodone 15 mg every 6 hours at baseline, this was continued, per nursing staff was requesting IV narcotics while  inpatient  Alcohol use -Patient denies daily alcohol use, no withdrawal noted  Schizoaffective disorder  Continue seroquel, Depakote, Cymbalta.  There is been a transition of care visit with the case manager last week on the ninth.  Note the patient went to urgent care on the eighth and had follow-up labs which were stable.   The patient is working attorney to see if he can get disability or Workmen's Compensation for his back disease so he can see a Licensed conveyancer.  He knows without insurance he is not able to access any specialist at this time.  We have been getting his medications to him for free through the patient assistance fund  10/18 This patient is seen in return follow-up today complaining of severe right foot pain and inability to walk properly. He has severe lumbar radiculopathy and is yet to see a spine specialist. He states his workman compensation payout may occur in the next several days. He ran out of the oxycodone 15 mg and a refill for Percocet was given. He pushed back very hard on this at this visit stating this does not work for him. I engaged with the patient and told him that this was what I was comfortable prescribing given his very high risk due to multiple medications interacting resulting in potential high abuse threshold and also potential side effect of unconsciousness  He states he still has a high output from his ostomy and ran out of the Lomotil I indicated we would refill this  I also indicated to the patient he has been using a patient assistance phone which is short-term only and we have been waiting to try to get him either [place] assistance or application for disability. He states his disability is in process but may not come through anytime soon  02/29/2020 Multiple subsegmental pulmonary emboli without acute cor pulmonale (HCC) Continue Xarelto for life  Crohn's disease with complication ([place]) Treatment as per gastroenterology  Lumbar  radiculopathy Lumbar radiculopathy again have made referrals on several occasions to neurosurgery I will make out yet another referral and told the patient he must keep this appointment  I will refill Percocet 10 mg/325 1 every 8 hours and I gave him 30 of these will also increase gabapentin to 600 mg 3 times daily  Protrusion of lumbar intervertebral disc As per radiculopathy assessment  Bipolar affective disorder, currently depressed, mild ([place]) Bipolar disorder continue current therapy per mental health provider   Yoskar was seen today for back pain.  Diagnoses and all orders for this visit:  Lumbar radiculopathy  Chronic pain syndrome -     [id number] 11+Oxyco+Alc+Crt-Bund  Multiple subsegmental pulmonary emboli without acute cor pulmonale (HCC)  Crohn's disease with complication, unspecified gastrointestinal tract location [facility])  Protrusion of lumbar intervertebral disc  Bipolar affective disorder, currently depressed, mild ([place])  Other orders -     diphenoxylate-atropine (LOMOTIL) 2.5-0.025 MG tablet; Take 1 tablet by mouth 4 (four) times daily.  Needs colon exam or Fobt repeated off xarelto  Needs Tdap, hep c  Past Medical History:  Diagnosis Date  . Acute renal failure ([place]) 12/16/2019  . Anxiety    per pt report  . Colon cancer ([place])   . Crohn disease ([place])   . Depression    per pt report  . Foot drop, right   . Myocardial infarction ([place])   . PE (pulmonary thromboembolism) ([place])   . PTSD (post-traumatic stress disorder)   . Thyroid disease      No family history on file.   Social History   Socioeconomic History  . Marital status: Divorced    Spouse name: Not on file  . Number of children: Not on file  . Years of education: Not on file  . Highest education level: Not on file  Occupational History  . Not on file  Tobacco Use  . Smoking status: Current Every Day Smoker    Packs/day: 1.00  . Smokeless tobacco: Never Used  Vaping Use   . Vaping Use: Never used  Substance and Sexual Activity  . Alcohol use: Yes  . Drug use: Not Currently  . Sexual activity: Yes  Other Topics Concern  . Not on file  Social History Narrative  . Not on file   Social Determinants of Health   Financial Resource Strain:   . Difficulty of Paying Living Expenses: Not on file  Food Insecurity:   . Worried About Charity fundraiser in the Last Year: Not on file  . Ran Out of Food in the Last Year: Not on file  Transportation Needs:   . Lack of Transportation (Medical): Not on file  . Lack of Transportation (Non-Medical): Not on file  Physical Activity:   . Days of Exercise per Week: Not on file  . Minutes of Exercise per Session: Not on file  Stress:   . Feeling of Stress : Not on file  Social Connections:   . Frequency of Communication with Friends and Family: Not on file  . Frequency of Social Gatherings with Friends and Family: Not on file  . Attends Religious Services: Not on file  . Active Member of Clubs or Organizations: Not on file  . Attends Archivist Meetings: Not on file  . Marital Status: Not on file  Intimate Partner Violence:   . Fear of Current or Ex-Partner: Not on file  . Emotionally Abused: Not on file  . Physically Abused: Not on file  . Sexually Abused: Not on file     Allergies  Allergen Reactions  . Azathioprine Other (See Comments)    Pt does not recall reaction    . Ciprofloxacin Other (See Comments)    Pt does not recall reaction    . Mesalamine Other (See Comments)    Pt does not recall reaction  . Metronidazole Other (See Comments)    Pt does not recall reaction   . Sulfa Antibiotics Other (See Comments)    Pt does not recall reaction   . Barium Sulfate Rash  . Barium-Containing Compounds Rash     Outpatient Medications Prior to Visit  Medication Sig Dispense Refill  . acetaminophen (TYLENOL) 325 MG tablet Take 2 tablets (650 mg total) by mouth every 6 (six) hours as needed for  mild pain (or Fever >/= 101). (Patient taking differently: Take 650 mg by mouth every 6 (six) hours as needed for mild pain or moderate pain. )    . cyclobenzaprine (FLEXERIL) 10 MG tablet Take 1 tablet (10 mg total) by  mouth 2 (two) times daily as needed for muscle spasms. 30 tablet 0  . diphenoxylate-atropine (LOMOTIL) 2.5-0.025 MG tablet Take 1 tablet by mouth 4 (four) times daily. 90 tablet 0  . divalproex (DEPAKOTE) 500 MG DR tablet Take 1 tablet (500 mg total) by mouth 2 (two) times daily. 60 tablet 2  . DULoxetine (CYMBALTA) 60 MG capsule Take 1 capsule (60 mg total) by mouth daily. 30 capsule 2  . gabapentin (NEURONTIN) 300 MG capsule Take 2 capsules (600 mg total) by mouth 3 (three) times daily. 180 capsule 2  . lidocaine (LIDODERM) 5 % Place 1 patch onto the skin daily. Remove & Discard patch within 12 hours or as directed by MD (Patient not taking: Reported on 01/31/2020) 10 patch 0  . naloxone (NARCAN) nasal spray 4 mg/0.1 mL Use as needed for overdose symptoms from opiate (Patient taking differently: Place 0.4 mg into the nose See admin instructions. Use as needed for overdose symptoms from opiate) 1 each 0  . oxyCODONE (ROXICODONE) 15 MG immediate release tablet Take 1 tablet (15 mg total) by mouth every 6 (six) hours as needed. 40 tablet 0  . potassium chloride (KLOR-CON) 10 MEQ tablet Take 1 tablet (10 mEq total) by mouth daily for 5 days. 5 tablet 0  . predniSONE (DELTASONE) 10 MG tablet Take 4 tablets daily for 5 days then stop 20 tablet 0  . simethicone (MYLICON) 80 MG chewable tablet Chew 80 mg by mouth every 6 (six) hours as needed for flatulence.     No facility-administered medications prior to visit.      Review of Systems  Constitutional: Positive for activity change.  HENT: Negative.   Eyes: Negative.   Respiratory: Negative for cough, shortness of breath and wheezing.   Cardiovascular: Negative for palpitations and leg swelling.  Gastrointestinal: Positive for  diarrhea. Negative for abdominal distention, anal bleeding, blood in stool, constipation, nausea, rectal pain and vomiting.  Endocrine: Negative.   Genitourinary: Negative.   Musculoskeletal: Positive for arthralgias, back pain, gait problem and joint swelling.  Neurological: Positive for dizziness and weakness.  Psychiatric/Behavioral: Positive for sleep disturbance. Negative for agitation, behavioral problems, decreased concentration, dysphoric mood, self-injury and suicidal ideas. The patient is not nervous/anxious and is not hyperactive.        Objective:   Physical Exam There were no vitals filed for this visit.  Gen: Pleasant, well-nourished, in no distress,  normal affect  ENT: No lesions,  mouth clear,  oropharynx clear, no postnasal drip  Neck: No JVD, no TMG, no carotid bruits  Lungs: No use of accessory muscles, no dullness to percussion, clear without rales or rhonchi  Cardiovascular: RRR, heart sounds normal, no murmur or gallops, no peripheral edema  Abdomen: soft and NT, no HSM,  BS normal  Musculoskeletal: No deformities, no cyanosis or clubbing  Neuro: alert, non focal  Skin: Warm, healing excoriations on both dorsum of both feet    BMP Latest Ref Rng & Units 02/07/2020 12/22/2019 12/20/2019  Glucose 70 - 99 mg/dL 104(H) 164(H) 116(H)  BUN 6 - 20 mg/dL 11 6 14   Creatinine 0.61 - 1.24 mg/dL 1.24 1.64(H) 1.93(H)  BUN/Creat Ratio 9 - 20 - - -  Sodium 135 - 145 mmol/L 138 138 135  Potassium 3.5 - 5.1 mmol/L 2.8(L) 3.4(L) 3.6  Chloride 98 - 111 mmol/L 100 100 101  CO2 22 - 32 mmol/L 27 26 23   Calcium 8.9 - 10.3 mg/dL 8.5(L) 9.1 8.2(L)   CBC Latest Ref Rng &  Units 02/07/2020 12/22/2019 12/20/2019  WBC 4.0 - 10.5 K/uL 8.5 6.8 2.6(L)  Hemoglobin 13.0 - 17.0 g/dL 12.8(L) 12.0(L) 9.7(L)  Hematocrit 39 - 52 % 37.7(L) 36.4(L) 29.7(L)  Platelets 150 - 400 K/uL 198 156 80(L)   Hepatic Function Latest Ref Rng & Units 02/07/2020 12/22/2019 12/17/2019  Total Protein 6.5 - 8.1  g/dL 6.7 7.2 7.1  Albumin 3.5 - 5.0 g/dL 3.5 3.3(L) 3.1(L)  AST 15 - 41 U/L 49(H) 43(H) 32  ALT 0 - 44 U/L 60(H) 28 31  Alk Phosphatase 38 - 126 U/L 121 109 86  Total Bilirubin 0.3 - 1.2 mg/dL 1.1 0.6 1.0  Bilirubin, Direct 0.00 - 0.40 mg/dL - - -       Assessment & Plan:  I personally reviewed all images and lab data in the [facility] system as well as any outside material available during this office visit and agree with the  radiology impressions.   No problem-specific Assessment & Plan notes found for this encounter.   There are no diagnoses linked to this encounter.

## 2020-03-02 ENCOUNTER — Other Ambulatory Visit (HOSPITAL_COMMUNITY): Payer: Self-pay | Admitting: Critical Care Medicine

## 2020-03-02 ENCOUNTER — Other Ambulatory Visit: Payer: Self-pay | Admitting: Critical Care Medicine

## 2020-03-02 MED ORDER — OXYCODONE HCL 15 MG PO TABS
15.0000 mg | ORAL_TABLET | Freq: Four times a day (QID) | ORAL | 0 refills | Status: DC | PRN
Start: 2020-03-02 — End: 2020-03-10

## 2020-03-02 MED FILL — oxyCODONE HCL 15 MG TABS: 15 | 10 days supply | Qty: 40 | Fill #0

## 2020-03-02 MED FILL — clonazePAM 0.5 MG TABS: 0.5 | 30 days supply | Qty: 30 | Fill #2

## 2020-03-03 ENCOUNTER — Encounter: Payer: Self-pay | Admitting: Physical Therapy

## 2020-03-03 ENCOUNTER — Ambulatory Visit: Payer: Self-pay | Attending: Critical Care Medicine | Admitting: Physical Therapy

## 2020-03-03 ENCOUNTER — Other Ambulatory Visit: Payer: Self-pay

## 2020-03-03 DIAGNOSIS — G8929 Other chronic pain: Secondary | ICD-10-CM

## 2020-03-03 DIAGNOSIS — R293 Abnormal posture: Secondary | ICD-10-CM

## 2020-03-03 DIAGNOSIS — M5441 Lumbago with sciatica, right side: Secondary | ICD-10-CM | POA: Insufficient documentation

## 2020-03-03 NOTE — Therapy (Signed)
[place], [place], [address] Phone: [phone]   Fax:  [phone]  Physical Therapy Evaluation  Patient Details  Name: Juan Hester MRN: [id number] Date of Birth: [birth year] Referring Provider (PT): Asencion Noble, MD   Encounter Date: 03/03/2020   PT End of Session - 03/03/20 1132    Visit Number 1    Number of Visits 9    Date for PT Re-Evaluation 04/30/19    Authorization Type none    PT Start Time 1017    PT Stop Time 1055    PT Time Calculation (min) 38 min    Activity Tolerance Patient tolerated treatment well    Behavior During Therapy [facility] for tasks assessed/performed;Anxious           Past Medical History:  Diagnosis Date  . Acute renal failure ([place]) 12/16/2019  . Anxiety    per pt report  . Colon cancer ([place])   . Crohn disease ([place])   . Depression    per pt report  . Foot drop, right   . Myocardial infarction ([place])   . PE (pulmonary thromboembolism) ([place])   . PTSD (post-traumatic stress disorder)   . Thyroid disease     Past Surgical History:  Procedure Laterality Date  . ABDOMINAL SURGERY    . COLON SURGERY    . IR RADIOLOGIST EVAL & MGMT  08/31/2019  . OSTOMY    . SMALL INTESTINE SURGERY      There were no vitals filed for this visit.    Subjective Assessment - 03/03/20 1021    Subjective I feel like I am getting better and trying to stay active. It still hruts a lot but doing my best to do some of the small exercises. I still cannot seem to flex my toes, seems to have sensation but lacking motor control. HS pain is decreased. Sees neurologist on 29th. Constant pain in foot and then back starts stiffening up when I walk around for any amount of time.    How long can you stand comfortably? 10 min with SPC    How long can you walk comfortably? 15 min with [facility]              [facility] PT Assessment - 03/03/20 0001      Assessment   Medical Diagnosis lumbar radiculopathy     Referring Provider (PT) Asencion Noble, MD    Onset Date/Surgical Date --   June 2021- foot pain   Hand Dominance Right    Next MD Visit 11/29    Prior Therapy yes      Precautions   Precautions None      Restrictions   Weight Bearing Restrictions No      Balance Screen   Has the patient fallen in the past 6 months No      Home Environment   Living Environment --      Prior Function   Level of Independence Requires assistive device for independence      Cognition   Overall Cognitive Status Within Functional Limits for tasks assessed      Observation/Other Assessments   Skin Integrity redness along anterior lower leg with edema below knee on Rt    Focus on Therapeutic Outcomes (FOTO)  not added      Sensation   Additional Comments hypersensitivity to dorsal Rt foot      Posture/Postural Control   Posture Comments slouched position, able to sit in figure 4 position  Palpation   Palpation comment significant tenderness to light touch to dorsal aspect of Rt foot- done by pt      Ambulation/Gait   Gait Comments SPC in Lt hand, antalgic on Rt, Rt foot exeternally rotated                      Objective measurements completed on examination: See above findings.               PT Education - 03/03/20 1029    Education Details trekking poles, anatomy of condition-CRPS, POC, desensitization for HEP, medication weening within prescription limits    Person(s) Educated Patient    Methods Explanation    Comprehension Verbalized understanding;Need further instruction            PT Short Term Goals - 03/03/20 1152      PT SHORT TERM GOAL #1   Title pt will be indpendent with gradual increases in desensitization input    Baseline began educating at eval    Time 3    Period Weeks    Status New    Target Date 03/25/20             PT Long Term Goals - 03/03/20 1153      PT LONG TERM GOAL #1   Title pt will demo ambulation with neutral  rotation of Rt LE    Baseline Rt LE ext rot at eval    Time 8    Period Weeks    Status New    Target Date 04/29/20      PT LONG TERM GOAL #2   Title pt will be able to demo active flexion of all 5 toes    Baseline unable actively at eval    Time 8    Period Weeks    Status New    Target Date 04/29/20      PT LONG TERM GOAL #3   Title pt will be able to ambulate short distances without use of AD with confidence in balance    Baseline feels like he needs the crutch for balance control at eval    Time 8    Period Weeks    Status New    Target Date 04/29/20      PT LONG TERM GOAL #4   Title Sensitivity decreased to be able to tolerate closed-toe shoes that secure to his feet for safety in ambulation    Baseline unable to tolerate at eval    Time 8    Period Weeks    Status New    Target Date 04/29/20                  Plan - 03/03/20 1138    Clinical Impression Statement Pt presents to PT after recent episode for treatment of back pain following a fall. Pt reports his back and leg are feeling much better but he continues to have severe pain in his foot and inability to flex his toes. S/s today consistent with CRPS of anterior lower leg and dorsal right foot. He reports increased back pain with walking but is leaning significantly on cane resulting in poor postures and excessive pressure to lumbar musculature. We spent eval discussing CRPS and desensitization techniques to begin at home. He ambulated with bil trekking poles today which did improve posture through lumbopelvic region but cont with Rt LE ext rot in gait. Pt will benefit from skilled PT to progress through desensitization and  improve gait pattern.    Personal Factors and Comorbidities Comorbidity 3+    Comorbidities hx of MI, depression, PTSD, colostomy    Examination-Activity Limitations Sit;Bend;Stand;Locomotion Level    Stability/Clinical Decision Making Evolving/Moderate complexity    Clinical Decision  Making Moderate    Rehab Potential Fair    PT Frequency 1x / week    PT Duration 8 weeks    PT Treatment/Interventions ADLs/Self Care Home Management;Cryotherapy;Electrical Stimulation;Gait training;Moist Heat;Stair training;Functional mobility training;Therapeutic activities;Therapeutic exercise;Balance training;Neuromuscular re-education;Manual techniques;Patient/family education;Passive range of motion;Taping    PT Home Exercise Plan wear untied shoes while seated, foot in water, light massage, keep pain tolerable    Consulted and Agree with Plan of Care Patient           Patient will benefit from skilled therapeutic intervention in order to improve the following deficits and impairments:  Abnormal gait, Difficulty walking, Decreased activity tolerance, Decreased skin integrity, Pain, Decreased balance, Improper body mechanics, Postural dysfunction, Increased edema, Decreased strength  Visit Diagnosis: Chronic bilateral low back pain with right-sided sciatica - Plan: PT plan of care cert/re-cert  Abnormal posture - Plan: PT plan of care cert/re-cert     Problem List Patient Active Problem List   Diagnosis Date Noted  . Chronic pain syndrome 01/31/2020  . Homelessness 12/27/2019  . Colostomy status ([place]) 12/16/2019  . History of pulmonary embolism 12/16/2019  . Nausea and vomiting 12/16/2019  . Right foot drop 12/15/2019  . GAD (generalized anxiety disorder) 10/20/2019  . MDD (major depressive disorder) 10/20/2019  . PTSD (post-traumatic stress disorder) 10/20/2019  . Lumbar radiculopathy 10/19/2019  . Bipolar affective disorder, currently depressed, mild ([place]) 10/16/2019  . ETOH abuse 10/16/2019  . Protrusion of lumbar intervertebral disc 10/05/2019  . Multiple subsegmental pulmonary emboli without acute cor pulmonale ([place]) 07/21/2019  . Bipolar 1 disorder ([place]) 07/21/2019  . History of colon cancer 07/21/2019  . Schizoaffective disorder, bipolar type ([place]) 06/03/2019  .  Nicotine dependence, cigarettes, uncomplicated 64/40/3474  . Crohn's disease with complication ([place]) 25/95/6387    Sravya Grissom C. Malikiah Debarr PT, DPT 03/03/20 11:58 AM   [place] [facility] [address] [place], [place], [address] Phone: [phone]   Fax:  [phone]  Name: Juan Hester MRN: [id number] Date of Birth: Mar 04, 1981

## 2020-03-10 ENCOUNTER — Other Ambulatory Visit (HOSPITAL_COMMUNITY): Payer: Self-pay | Admitting: Critical Care Medicine

## 2020-03-10 ENCOUNTER — Other Ambulatory Visit: Payer: Self-pay | Admitting: Critical Care Medicine

## 2020-03-10 MED ORDER — OXYCODONE HCL 15 MG PO TABS
15.0000 mg | ORAL_TABLET | Freq: Four times a day (QID) | ORAL | 0 refills | Status: DC | PRN
Start: 2020-03-10 — End: 2020-03-20

## 2020-03-10 MED FILL — oxyCODONE HCL 15 MG TABS: 15 | 10 days supply | Qty: 40 | Fill #0

## 2020-03-13 ENCOUNTER — Telehealth: Payer: Self-pay | Admitting: Critical Care Medicine

## 2020-03-13 MED FILL — QUETIAPINE FUMARATE 50 MG T: 50 | 30 days supply | Qty: 60 | Fill #1

## 2020-03-13 MED FILL — QUETIAPINE FUMARATE 300 MG: 300 | 30 days supply | Qty: 30 | Fill #1

## 2020-03-13 NOTE — Telephone Encounter (Signed)
Patient is calling to request his medical records. Please advise CB- [phone]

## 2020-03-13 NOTE — Telephone Encounter (Signed)
Patient did have an OV with Dr. Vertell Limber, a neurosurgeon. Dr. Vertell Limber advised the patient to call Dr. Joya Gaskins to request a referral for a neurologist. He believes that there is something neurological going on. But does not believe that the patient is in need of surgery. Please advise with the patient when the referral has been placed. Cb- [phone]

## 2020-03-14 NOTE — Telephone Encounter (Signed)
Left message on voicemail for patient to return call.   Per message in notes of Neuro referral to Dr. Vertell Limber:  Rejection Reason - Other - Offered pt Dr. Donald Pore next avail. 03/13/2020. Per pt he will not being town that he is [place] out of [place]. Also, when explaining our self pay policy pt stated that he was told that only $100 would be due at check in. Our office policy is $414 is due at check in and if the patient would like to pay for the full office visit the same day we can offer a 30% discount. Pt hung up." [facility] [place] said on Feb 04, 2020 10:48 AM

## 2020-03-15 ENCOUNTER — Telehealth: Payer: Self-pay | Admitting: Critical Care Medicine

## 2020-03-15 ENCOUNTER — Other Ambulatory Visit (HOSPITAL_COMMUNITY): Payer: Self-pay | Admitting: Critical Care Medicine

## 2020-03-15 ENCOUNTER — Other Ambulatory Visit: Payer: Self-pay | Admitting: Critical Care Medicine

## 2020-03-15 DIAGNOSIS — M21371 Foot drop, right foot: Secondary | ICD-10-CM

## 2020-03-15 DIAGNOSIS — M5126 Other intervertebral disc displacement, lumbar region: Secondary | ICD-10-CM

## 2020-03-15 DIAGNOSIS — M5416 Radiculopathy, lumbar region: Secondary | ICD-10-CM

## 2020-03-15 DIAGNOSIS — M79671 Pain in right foot: Secondary | ICD-10-CM

## 2020-03-15 MED ORDER — HYDROXYZINE HCL 10 MG PO TABS: 10.0000 mg | ORAL_TABLET | Freq: Three times a day (TID) | ORAL | 0 refills | Status: DC | PRN

## 2020-03-15 MED ORDER — NALOXONE HCL 4 MG/0.1ML NA LIQD: 0.4000 mg | NASAL | 1 refills | Status: DC

## 2020-03-15 MED ORDER — CYCLOBENZAPRINE HCL 10 MG PO TABS: 10.0000 mg | ORAL_TABLET | Freq: Two times a day (BID) | ORAL | 0 refills | Status: DC | PRN

## 2020-03-15 MED FILL — hydrOXYzine HCL 10 MG TABS: 10 | 10 days supply | Qty: 30 | Fill #0

## 2020-03-15 MED FILL — NARCAN 4 MG NASAL SPRAY: 4 | 1 days supply | Qty: 2 | Fill #0

## 2020-03-15 MED FILL — CYCLOBENZAPRINE HCL 10 MG T: 10 | 15 days supply | Qty: 30 | Fill #0

## 2020-03-15 NOTE — Telephone Encounter (Signed)
Call placed to pharmacy. This was already corrected by Dr. Joya Gaskins. No further action needed.

## 2020-03-15 NOTE — Progress Notes (Signed)
Med refills

## 2020-03-15 NOTE — Telephone Encounter (Signed)
Patient already aware that referral was to neurologist. He is waiting for appointment at this time.

## 2020-03-15 NOTE — Telephone Encounter (Signed)
Juan Hester [facility] is calling and needs clarification on narcan spray

## 2020-03-20 ENCOUNTER — Other Ambulatory Visit: Payer: Self-pay | Admitting: Critical Care Medicine

## 2020-03-20 MED ORDER — OXYCODONE HCL 15 MG PO TABS: 15.0000 mg | ORAL_TABLET | Freq: Four times a day (QID) | ORAL | 0 refills | Status: DC | PRN

## 2020-03-20 NOTE — Progress Notes (Signed)
Pain med refill  PDMP reviewed

## 2020-03-21 ENCOUNTER — Ambulatory Visit: Payer: MEDICAID | Admitting: Diagnostic Neuroimaging

## 2020-03-21 NOTE — Telephone Encounter (Signed)
Called patient and left a voice mail saying that in order to precess the medical records request he would need to come to the office and sign a release of information form.

## 2020-03-24 ENCOUNTER — Encounter: Payer: Self-pay | Admitting: Physical Therapy

## 2020-03-24 ENCOUNTER — Ambulatory Visit: Payer: Self-pay | Attending: Critical Care Medicine | Admitting: Physical Therapy

## 2020-03-24 ENCOUNTER — Other Ambulatory Visit: Payer: Self-pay

## 2020-03-24 DIAGNOSIS — R293 Abnormal posture: Secondary | ICD-10-CM | POA: Insufficient documentation

## 2020-03-24 DIAGNOSIS — M5441 Lumbago with sciatica, right side: Secondary | ICD-10-CM | POA: Insufficient documentation

## 2020-03-24 DIAGNOSIS — G8929 Other chronic pain: Secondary | ICD-10-CM | POA: Insufficient documentation

## 2020-03-24 NOTE — Therapy (Signed)
[place], [place], [address] Phone: [phone]   Fax:  [phone]  Physical Therapy Treatment  Patient Details  Name: Juan Hester MRN: [id number] Date of Birth: July 19, 1980 Referring Provider (PT): Asencion Noble, MD   Encounter Date: 03/24/2020   PT End of Session - 03/24/20 0939    Visit Number 2    Number of Visits 9    Date for PT Re-Evaluation 04/30/19    PT Start Time 0930    PT Stop Time 1013    PT Time Calculation (min) 43 min    Activity Tolerance Patient limited by pain    Behavior During Therapy [facility] for tasks assessed/performed           Past Medical History:  Diagnosis Date  . Acute renal failure ([place]) 12/16/2019  . Anxiety    per pt report  . Colon cancer ([place])   . Crohn disease ([place])   . Depression    per pt report  . Foot drop, right   . Myocardial infarction ([place])   . PE (pulmonary thromboembolism) ([place])   . PTSD (post-traumatic stress disorder)   . Thyroid disease     Past Surgical History:  Procedure Laterality Date  . ABDOMINAL SURGERY    . COLON SURGERY    . IR RADIOLOGIST EVAL & MGMT  08/31/2019  . OSTOMY    . SMALL INTESTINE SURGERY      There were no vitals filed for this visit.   Subjective Assessment - 03/24/20 0935    Subjective Rt foot and lower back today. Took pain pill about an hour ago. Afraid of foot to touch it.    Patient Stated Goals to decrease pain    Currently in Pain? Yes    Pain Score 6     Pain Location Back    Pain Orientation Lower    Pain Radiating Towards foot    Aggravating Factors  constant    Pain Relieving Factors pain meds                             [facility] Adult PT Treatment/Exercise - 03/24/20 0001      Lumbar Exercises: Stretches   Gastroc Stretch Limitations long sitting active DF/PF    Other Lumbar Stretch Exercise hooklying small range LTR    Other Lumbar Stretch Exercise seated heel slide for  soleus stretch      Lumbar Exercises: Aerobic   Nustep 5 min L5 UE & LE      Lumbar Exercises: Supine   Other Supine Lumbar Exercises hooklying deep breathig      Manual Therapy   Manual Therapy Soft tissue mobilization    Soft tissue mobilization bil lumbar paraspinals & QL, Rt glut max superior fibers                  PT Education - 03/24/20 1149    Education Details what is CRPS, referred pain, desensitization    Person(s) Educated Patient    Methods Explanation    Comprehension Verbalized understanding;Need further instruction            PT Short Term Goals - 03/03/20 1152      PT SHORT TERM GOAL #1   Title pt will be indpendent with gradual increases in desensitization input    Baseline began educating at eval    Time 3    Period Weeks  Status New    Target Date 03/25/20             PT Long Term Goals - 03/03/20 1153      PT LONG TERM GOAL #1   Title pt will demo ambulation with neutral rotation of Rt LE    Baseline Rt LE ext rot at eval    Time 8    Period Weeks    Status New    Target Date 04/29/20      PT LONG TERM GOAL #2   Title pt will be able to demo active flexion of all 5 toes    Baseline unable actively at eval    Time 8    Period Weeks    Status New    Target Date 04/29/20      PT LONG TERM GOAL #3   Title pt will be able to ambulate short distances without use of AD with confidence in balance    Baseline feels like he needs the crutch for balance control at eval    Time 8    Period Weeks    Status New    Target Date 04/29/20      PT LONG TERM GOAL #4   Title Sensitivity decreased to be able to tolerate closed-toe shoes that secure to his feet for safety in ambulation    Baseline unable to tolerate at eval    Time 8    Period Weeks    Status New    Target Date 04/29/20                 Plan - 03/24/20 1145    Clinical Impression Statement focused on STM to trigger points in lower back region today which  created soreness as expected- we discussed referred pain while he was on the nu step. Two incidence of tripping over his foot today- was wearing sandals and pants that wrapped under his feet and we discussed the trip hazzard that this can present. Provided with printout and written instructions for desensitization of foot. Noted that he could not keep foot flat on nu step so stretches for gastroc and soleus were added to HEP.    PT Treatment/Interventions ADLs/Self Care Home Management;Cryotherapy;Electrical Stimulation;Gait training;Moist Heat;Stair training;Functional mobility training;Therapeutic activities;Therapeutic exercise;Balance training;Neuromuscular re-education;Manual techniques;Patient/family education;Passive range of motion;Taping    PT Next Visit Plan cont STM and gross LE stretching    PT Home Exercise Plan wear untied shoes while seated, foot in water, light massage, keep pain tolerable    Consulted and Agree with Plan of Care Patient           Patient will benefit from skilled therapeutic intervention in order to improve the following deficits and impairments:  Abnormal gait,Difficulty walking,Decreased activity tolerance,Decreased skin integrity,Pain,Decreased balance,Improper body mechanics,Postural dysfunction,Increased edema,Decreased strength  Visit Diagnosis: Chronic bilateral low back pain with right-sided sciatica  Abnormal posture     Problem List Patient Active Problem List   Diagnosis Date Noted  . Chronic pain syndrome 01/31/2020  . Homelessness 12/27/2019  . Colostomy status ([place]) 12/16/2019  . History of pulmonary embolism 12/16/2019  . Nausea and vomiting 12/16/2019  . Right foot drop 12/15/2019  . GAD (generalized anxiety disorder) 10/20/2019  . MDD (major depressive disorder) 10/20/2019  . PTSD (post-traumatic stress disorder) 10/20/2019  . Lumbar radiculopathy 10/19/2019  . Bipolar affective disorder, currently depressed, mild ([place]) 10/16/2019   . ETOH abuse 10/16/2019  . Protrusion of lumbar intervertebral disc 10/05/2019  . Multiple subsegmental  pulmonary emboli without acute cor pulmonale ([place]) 07/21/2019  . Bipolar 1 disorder ([place]) 07/21/2019  . History of colon cancer 07/21/2019  . Schizoaffective disorder, bipolar type ([place]) 06/03/2019  . Nicotine dependence, cigarettes, uncomplicated 15/97/3312  . Crohn's disease with complication ([place]) 50/87/1994    Juan Hester C. Nailani Full PT, DPT 03/24/20 11:50 AM   [facility] [address] [place], [place], [address] Phone: [phone]   Fax:  [phone]  Name: Binnie Vonderhaar MRN: [id number] Date of Birth: 25-Jun-1980

## 2020-03-29 MED ORDER — OXYCODONE HCL 15 MG PO TABS: 15.0000 mg | ORAL_TABLET | Freq: Four times a day (QID) | ORAL | 0 refills | Status: DC | PRN

## 2020-03-31 ENCOUNTER — Other Ambulatory Visit: Payer: Self-pay

## 2020-03-31 ENCOUNTER — Encounter: Payer: Self-pay | Admitting: Physical Therapy

## 2020-03-31 ENCOUNTER — Ambulatory Visit: Payer: Self-pay | Admitting: Physical Therapy

## 2020-03-31 DIAGNOSIS — G8929 Other chronic pain: Secondary | ICD-10-CM

## 2020-03-31 DIAGNOSIS — R293 Abnormal posture: Secondary | ICD-10-CM

## 2020-03-31 MED FILL — clonazePAM 0.5 MG TABS: 0.5 | 30 days supply | Qty: 30 | Fill #3

## 2020-03-31 NOTE — Therapy (Signed)
[place], [place], [address] Phone: [phone]   Fax:  [phone]  Physical Therapy Treatment  Patient Details  Name: Juan Hester MRN: [id number] Date of Birth: 1981-02-24 Referring Provider (PT): Asencion Noble, MD   Encounter Date: 03/31/2020   PT End of Session - 03/31/20 0939    Visit Number 3    Number of Visits 9    Date for PT Re-Evaluation 04/30/19    Authorization Type none    PT Start Time 0930    PT Stop Time 1005    PT Time Calculation (min) 35 min    Activity Tolerance Patient limited by pain    Behavior During Therapy [facility] for tasks assessed/performed;Anxious           Past Medical History:  Diagnosis Date  . Acute renal failure ([place]) 12/16/2019  . Anxiety    per pt report  . Colon cancer ([place])   . Crohn disease ([place])   . Depression    per pt report  . Foot drop, right   . Myocardial infarction ([place])   . PE (pulmonary thromboembolism) ([place])   . PTSD (post-traumatic stress disorder)   . Thyroid disease     Past Surgical History:  Procedure Laterality Date  . ABDOMINAL SURGERY    . COLON SURGERY    . IR RADIOLOGIST EVAL & MGMT  08/31/2019  . OSTOMY    . SMALL INTESTINE SURGERY      There were no vitals filed for this visit.   Subjective Assessment - 03/31/20 0938    Subjective I feel like I can put a little more pressure on the top of my foot. Having a hard time finding shoes in store due to stock limitations.    Patient Stated Goals to decrease pain    Currently in Pain? Yes    Pain Score 5     Pain Location Foot    Pain Orientation --   dorsal   Pain Descriptors / Indicators Aching                             [facility] Adult PT Treatment/Exercise - 03/31/20 0001      Lumbar Exercises: Seated   Long Arc Quad on Chair Right;15 reps    Other Seated Lumbar Exercises foot: towel scrunches, windshield wipers, heel/toe raises    Other Seated Lumbar  Exercises heel slide for hamstring curl, isometric seated clam                    PT Short Term Goals - 03/03/20 1152      PT SHORT TERM GOAL #1   Title pt will be indpendent with gradual increases in desensitization input    Baseline began educating at eval    Time 3    Period Weeks    Status New    Target Date 03/25/20             PT Long Term Goals - 03/03/20 1153      PT LONG TERM GOAL #1   Title pt will demo ambulation with neutral rotation of Rt LE    Baseline Rt LE ext rot at eval    Time 8    Period Weeks    Status New    Target Date 04/29/20      PT LONG TERM GOAL #2   Title pt will be able to demo  active flexion of all 5 toes    Baseline unable actively at eval    Time 8    Period Weeks    Status New    Target Date 04/29/20      PT LONG TERM GOAL #3   Title pt will be able to ambulate short distances without use of AD with confidence in balance    Baseline feels like he needs the crutch for balance control at eval    Time 8    Period Weeks    Status New    Target Date 04/29/20      PT LONG TERM GOAL #4   Title Sensitivity decreased to be able to tolerate closed-toe shoes that secure to his feet for safety in ambulation    Baseline unable to tolerate at eval    Time 8    Period Weeks    Status New    Target Date 04/29/20                 Plan - 03/31/20 1000    Clinical Impression Statement Incr time and pressure on nustep today- began feeling discomfort at the 6.5 min mark. Exercises added to HEP for foot motion as well as isolating quads and hamstrings to decrease compensation patterns.    PT Treatment/Interventions ADLs/Self Care Home Management;Cryotherapy;Electrical Stimulation;Gait training;Moist Heat;Stair training;Functional mobility training;Therapeutic activities;Therapeutic exercise;Balance training;Neuromuscular re-education;Manual techniques;Patient/family education;Passive range of motion;Taping    PT Next Visit Plan  gross LE stretching, desensitization, foot intrinsic activation- review HEP    PT Home Exercise Plan wear untied shoes while seated, foot in water, light massage, keep pain tolerable, [id number]    Consulted and Agree with Plan of Care Patient           Patient will benefit from skilled therapeutic intervention in order to improve the following deficits and impairments:  Abnormal gait,Difficulty walking,Decreased activity tolerance,Decreased skin integrity,Pain,Decreased balance,Improper body mechanics,Postural dysfunction,Increased edema,Decreased strength  Visit Diagnosis: Chronic bilateral low back pain with right-sided sciatica  Abnormal posture     Problem List Patient Active Problem List   Diagnosis Date Noted  . Chronic pain syndrome 01/31/2020  . Homelessness 12/27/2019  . Colostomy status ([place]) 12/16/2019  . History of pulmonary embolism 12/16/2019  . Nausea and vomiting 12/16/2019  . Right foot drop 12/15/2019  . GAD (generalized anxiety disorder) 10/20/2019  . MDD (major depressive disorder) 10/20/2019  . PTSD (post-traumatic stress disorder) 10/20/2019  . Lumbar radiculopathy 10/19/2019  . Bipolar affective disorder, currently depressed, mild ([place]) 10/16/2019  . ETOH abuse 10/16/2019  . Protrusion of lumbar intervertebral disc 10/05/2019  . Multiple subsegmental pulmonary emboli without acute cor pulmonale ([place]) 07/21/2019  . Bipolar 1 disorder ([place]) 07/21/2019  . History of colon cancer 07/21/2019  . Schizoaffective disorder, bipolar type ([place]) 06/03/2019  . Nicotine dependence, cigarettes, uncomplicated 09/32/6712  . Crohn's disease with complication ([place]) 45/80/9983    Mirtie Bastyr C. Takayla Baillie PT, DPT 03/31/20 10:15 AM   [place] [facility] [address] [place], [place], [address] Phone: [phone]   Fax:  [phone]  Name: Juan Hester MRN: [id number] Date of Birth: [birth year]

## 2020-04-04 ENCOUNTER — Encounter: Payer: Self-pay | Admitting: Physical Therapy

## 2020-04-04 ENCOUNTER — Ambulatory Visit: Payer: Self-pay | Admitting: Physical Therapy

## 2020-04-04 ENCOUNTER — Other Ambulatory Visit: Payer: Self-pay

## 2020-04-04 DIAGNOSIS — R293 Abnormal posture: Secondary | ICD-10-CM

## 2020-04-04 DIAGNOSIS — G8929 Other chronic pain: Secondary | ICD-10-CM

## 2020-04-04 NOTE — Therapy (Addendum)
[place], [place], [address] Phone: [phone]   Fax:  [phone]  Physical Therapy Treatment/Discharge  Patient Details  Name: Bijon Mineer MRN: [id number] Date of Birth: 05-28-1980 Referring Provider (PT): Asencion Noble, MD   Encounter Date: 04/04/2020   PT End of Session - 04/04/20 1453    Visit Number 4    Number of Visits 9    Date for PT Re-Evaluation 04/30/19    Authorization Type none    PT Start Time 0076    PT Stop Time 1453    PT Time Calculation (min) 38 min    Activity Tolerance Patient tolerated treatment well    Behavior During Therapy [facility] for tasks assessed/performed           Past Medical History:  Diagnosis Date  . Acute renal failure ([place]) 12/16/2019  . Anxiety    per pt report  . Colon cancer ([place])   . Crohn disease ([place])   . Depression    per pt report  . Foot drop, right   . Myocardial infarction ([place])   . PE (pulmonary thromboembolism) ([place])   . PTSD (post-traumatic stress disorder)   . Thyroid disease     Past Surgical History:  Procedure Laterality Date  . ABDOMINAL SURGERY    . COLON SURGERY    . IR RADIOLOGIST EVAL & MGMT  08/31/2019  . OSTOMY    . SMALL INTESTINE SURGERY      There were no vitals filed for this visit.   Subjective Assessment - 04/04/20 1438    Subjective Took pain meds about 30 min ago. working on exercises but not really thinking about which ones hurt more.    Patient Stated Goals to decrease pain              [facility] PT Assessment - 04/04/20 0001      Ambulation/Gait   Gait Comments demo decreased external rotation of LE in gait but is still antalgic                         [facility] Adult PT Treatment/Exercise - 04/04/20 0001      Lumbar Exercises: Aerobic   Nustep 5 min L6 6.5 min   this was done at last appt as well     Lumbar Exercises: Seated   Long Arc Quad on Chair Both;15 reps    Other Seated Lumbar  Exercises plantar fascia rolling on tband roll    Other Seated Lumbar Exercises seated ball squeeze- bw knees, bw feet- in DF with heels rested; towel slides, inversion/eversion slides, towel scrunches; floor to bag of ice pressure                    PT Short Term Goals - 03/03/20 1152      PT SHORT TERM GOAL #1   Title pt will be indpendent with gradual increases in desensitization input    Baseline began educating at eval    Time 3    Period Weeks    Status New    Target Date 03/25/20             PT Long Term Goals - 03/03/20 1153      PT LONG TERM GOAL #1   Title pt will demo ambulation with neutral rotation of Rt LE    Baseline Rt LE ext rot at eval    Time 8  Period Weeks    Status New    Target Date 04/29/20      PT LONG TERM GOAL #2   Title pt will be able to demo active flexion of all 5 toes    Baseline unable actively at eval    Time 8    Period Weeks    Status New    Target Date 04/29/20      PT LONG TERM GOAL #3   Title pt will be able to ambulate short distances without use of AD with confidence in balance    Baseline feels like he needs the crutch for balance control at eval    Time 8    Period Weeks    Status New    Target Date 04/29/20      PT LONG TERM GOAL #4   Title Sensitivity decreased to be able to tolerate closed-toe shoes that secure to his feet for safety in ambulation    Baseline unable to tolerate at eval    Time 8    Period Weeks    Status New    Target Date 04/29/20                 Plan - 04/04/20 1456    Clinical Impression Statement pt repeatedly says he doesn't understand why it hurts so bad- we have discussed anatomy of the condition in the past but I did refer him to [facility] for further education if he would like.Pain significantly increased with seated roller to plantar fascia but states he can tolerate it in figure 4 position when rolling by hand. Continued to advise resting when pain gets to a moderate  level to decrease pain input. will continue to benefit from gradual desensitization inputs. I have had the pt in a chair the last couple of apts in order to encourage upright posture through spine.    PT Treatment/Interventions ADLs/Self Care Home Management;Cryotherapy;Electrical Stimulation;Gait training;Moist Heat;Stair training;Functional mobility training;Therapeutic activities;Therapeutic exercise;Balance training;Neuromuscular re-education;Manual techniques;Patient/family education;Passive range of motion;Taping    PT Next Visit Plan gross LE stretching, desensitization, foot intrinsic activation    PT Home Exercise Plan wear untied shoes while seated, foot in water, light massage, keep pain tolerable, [id number]    Consulted and Agree with Plan of Care Patient           Patient will benefit from skilled therapeutic intervention in order to improve the following deficits and impairments:  Abnormal gait,Difficulty walking,Decreased activity tolerance,Decreased skin integrity,Pain,Decreased balance,Improper body mechanics,Postural dysfunction,Increased edema,Decreased strength  Visit Diagnosis: Chronic bilateral low back pain with right-sided sciatica  Abnormal posture     Problem List Patient Active Problem List   Diagnosis Date Noted  . Chronic pain syndrome 01/31/2020  . Homelessness 12/27/2019  . Colostomy status ([place]) 12/16/2019  . History of pulmonary embolism 12/16/2019  . Nausea and vomiting 12/16/2019  . Right foot drop 12/15/2019  . GAD (generalized anxiety disorder) 10/20/2019  . MDD (major depressive disorder) 10/20/2019  . PTSD (post-traumatic stress disorder) 10/20/2019  . Lumbar radiculopathy 10/19/2019  . Bipolar affective disorder, currently depressed, mild ([place]) 10/16/2019  . ETOH abuse 10/16/2019  . Protrusion of lumbar intervertebral disc 10/05/2019  . Multiple subsegmental pulmonary emboli without acute cor pulmonale ([place]) 07/21/2019  . Bipolar 1 disorder  ([place]) 07/21/2019  . History of colon cancer 07/21/2019  . Schizoaffective disorder, bipolar type ([place]) 06/03/2019  . Nicotine dependence, cigarettes, uncomplicated 16/10/3708  . Crohn's disease with complication ([place]) 62/69/4854    Marjani Kobel C.  Ein Rijo PT, DPT 04/04/20 2:59 PM   [facility] [facility] [address] [place], [place], [address] Phone: [phone]   Fax:  [phone]  Name: Saafir Abdullah MRN: [id number] Date of Birth: [birth year]  PHYSICAL THERAPY DISCHARGE SUMMARY  Visits from Start of Care: 4  Current functional level related to goals / functional outcomes: See above   Remaining deficits: See above   Education / Equipment: Anatomy of condition, POC, HEP, exercise form/rationale  Plan: Patient agrees to discharge.  Patient goals were not met. Patient is being discharged due to the patient's request.  ?????  Pt cancelled all remaining appointments.    Embree Brawley C. Peggy Loge PT, DPT 04/11/20 12:56 PM

## 2020-04-06 MED ORDER — OXYCODONE HCL 15 MG PO TABS: 15.0000 mg | ORAL_TABLET | Freq: Four times a day (QID) | ORAL | 0 refills | Status: DC | PRN

## 2020-04-06 NOTE — Addendum Note (Signed)
Addended by: [name] on: 04/06/2020 06:59 AM   Modules accepted: Orders

## 2020-04-07 ENCOUNTER — Encounter: Payer: Self-pay | Admitting: Physical Therapy

## 2020-04-11 ENCOUNTER — Ambulatory Visit: Payer: Self-pay

## 2020-04-18 ENCOUNTER — Ambulatory Visit: Payer: Self-pay

## 2020-04-19 ENCOUNTER — Telehealth: Payer: Self-pay | Admitting: Critical Care Medicine

## 2020-04-19 ENCOUNTER — Telehealth (INDEPENDENT_AMBULATORY_CARE_PROVIDER_SITE_OTHER): Payer: No Payment, Other | Admitting: Psychiatry

## 2020-04-19 ENCOUNTER — Encounter (HOSPITAL_COMMUNITY): Payer: Self-pay | Admitting: Psychiatry

## 2020-04-19 ENCOUNTER — Other Ambulatory Visit: Payer: Self-pay | Admitting: Psychiatry

## 2020-04-19 ENCOUNTER — Other Ambulatory Visit: Payer: Self-pay

## 2020-04-19 DIAGNOSIS — F411 Generalized anxiety disorder: Secondary | ICD-10-CM

## 2020-04-19 DIAGNOSIS — F3131 Bipolar disorder, current episode depressed, mild: Secondary | ICD-10-CM

## 2020-04-19 MED ORDER — DULOXETINE HCL 60 MG PO CPEP: 60.0000 mg | ORAL_CAPSULE | Freq: Every day | ORAL | 2 refills | Status: DC

## 2020-04-19 MED ORDER — QUETIAPINE FUMARATE 50 MG PO TABS: 50.0000 mg | ORAL_TABLET | Freq: Every day | ORAL | 2 refills | Status: DC

## 2020-04-19 MED ORDER — DIVALPROEX SODIUM 500 MG PO DR TAB: 500.0000 mg | DELAYED_RELEASE_TABLET | Freq: Two times a day (BID) | ORAL | 2 refills | Status: DC

## 2020-04-19 MED ORDER — QUETIAPINE FUMARATE 400 MG PO TABS: 400.0000 mg | ORAL_TABLET | Freq: Every day | ORAL | 2 refills | Status: DC

## 2020-04-19 MED ORDER — CLONAZEPAM 0.25 MG PO TBDP
0.2500 mg | ORAL_TABLET | Freq: Two times a day (BID) | ORAL | 0 refills | Status: DC
Start: 2020-04-19 — End: 2020-05-10

## 2020-04-19 MED ORDER — OXYCODONE HCL 15 MG PO TABS: 15.0000 mg | ORAL_TABLET | Freq: Four times a day (QID) | ORAL | 0 refills | Status: DC | PRN

## 2020-04-19 MED FILL — QUETIAPINE FUMARATE 400 MG: 400 | 30 days supply | Qty: 30 | Fill #0

## 2020-04-19 MED FILL — DULoxetine HCL 60 MG CPEP: 60 | 30 days supply | Qty: 30 | Fill #0

## 2020-04-19 MED FILL — QUETIAPINE FUMARATE 50 MG T: 50 | 30 days supply | Qty: 30 | Fill #0

## 2020-04-19 MED FILL — DIVALPROEX SOD DR 500 MG TA: 500 | 30 days supply | Qty: 60 | Fill #0

## 2020-04-19 NOTE — Telephone Encounter (Signed)
I returned this patient's call I indicated to him I can no longer prescribe oxycodone and a 15 mg 4 times a day dosing until he sees either a pain management or a neurologist specialist.  Been refilling this now for several months and I can no longer proceed to do this.  He is asking to be able to now apply for the orange card so he can go see a pain specialist he will need to understand that the orange card is likely not been a cover any particular pain specialist in our system  I told him I would be willing to see him on January 10 we will discuss it further and I will send a 2-week supply to the [employer] but at this point I will likely need to discharge the patient from the practice as I cannot continue to provide him with high levels of pain medicine

## 2020-04-19 NOTE — Telephone Encounter (Signed)
Copied from [place] [id number]. Topic: Quick Communication - See Telephone Encounter >> Apr 18, 2020  2:17 PM Loma [place] wrote: CRM for notification. See Telephone encounter for: 04/18/20. Pt inquiring for Dr Joya Gaskins had not returned MyChart message. Pt told that office was closed for holiday and I would pass message along to nurse. Pt just stating issues with back and foot but does not care to leave detail message as Dr Joya Gaskins always talks with him FU [phone] >> Apr 19, 2020  9:52 AM Alanda Slim E wrote: Pt is calling to speak with Dr. Joya Gaskins about pain management and what he can do to get an orange card or card that will be able to have him get a referral to pain management / Pt is dealing with a lot of pain and wanted to let Dr. Joya Gaskins know he has dropped his disability so he can apply for the program that [facility] offers/ Pt wants to see if Dr. Joya Gaskins can continue to assist him with pain medication until he can get approval for program through [facility] or get referral to pain management for his back and foot issue / please advise and call pt to advise   Pt left a few Mychart messages as well.   Called patient and offered a financial appointment for jan 10th before his appt with [facility]. I advised patient he would need to either come pick up the application or we could mail it to his address. I told him if we mailed it, it may be too soon to gather all his documentation. Patient then asked what documentation was needed. I advised him proof of address, proof of income, proof of assets. He stated none of the above applied to him. So I let patient know he would need a notarized letter of support to apply. Patient continued asking questions more in depth than my knowledge. I advised him not to schedule, and that I would send a message to [facility] for a call back to get a better understanding of the requirements. Advised patient that it would likely not be today when he hears from [place]. Could you assist this patient  further please Clifton James?   Also just FYI for Dr. Joya Gaskins.

## 2020-04-19 NOTE — Progress Notes (Signed)
[place] MD/PA/NP OP Progress Note Virtual Visit via Video Note  I connected with Juan Hester on 04/19/20 at  9:00 AM EST by a video enabled telemedicine application and verified that I am speaking with the correct person using two identifiers.  Location: Patient: Home Provider: Clinic   I discussed the limitations of evaluation and management by telemedicine and the availability of in person appointments. The patient expressed understanding and agreed to proceed.  I provided 30 minutes of non-face-to-face time during this encounter.     04/19/2020 11:14 AM [name]  MRN:  [id number]  Chief Complaint:  "I'm agitated, depressed, and anxious"  HPI: 40 year old male seen today for follow up psychiatric evaluation.  He has a psychiatric history of bipolar 1 disorder, bipolar affective disorder, PTSD, schizoaffective disorder, anxiety, depression, and suicidal ideation.  He is currently being managed on Celexa 60 mg daily, hydroxyzine 25 mg 3 times daily, Seroquel 300 mg at bedtime (patient notes he was taking 400 at night to sleep), Seroquel 50 mg daily, Klonopin 0.25 mg twice daily as needed, and Depakote 500 mg twice daily.  Today the patient notes that his depression, anxiety, and psychosis have worsened due to him running out of his medications.   Today patient is pleasant, calm, cooperative, engaged in conversation, and maintains eye contact. He informed provider that since his last visit he ran out oh is medications and is now feeling more irritable, anxious, and depressed. Provider conducted a PHQ 9 and patient scored a 23. He notes that he has problems physically and mentally and is not able to help himself. He notes that he is frustrated because his disability has been pending for a year and he is unable to afford the care he needs. He notes that at time he has passive SI because of the above stressors but denies wanting to harm himself today. He also denies HI. Patient endorses [place]. He  notes that he frequently hears noise and people talking. He also notes that he see thing crawling that are not there. He also notes that his mood continues to fluctuate and reports that he is not sleeping. He informed Probation officer that he has not slept in four days. He notes that this occurred in the past and noted that he crashed on the 5th day.  Today he is agreeable to restarting his medications. He will increase Seroquel 350 mg to 400 mg nightly to help mange mood and sleep. He notes that he dislikes hydroxyzine and would like to discontinue it at this time. Patient given a 2 month supply of medications as he notes he can not afford to pick them up monthly. He notes that he will follow up at [facility] To receive his medications at a discounted price. He also notes that he found a coupon at [employer] to pick his Klonopin up for a reduced price. He will continue all other medications as prescribed. Patient will also follow up with Dr. Joya Gaskins at [facility] to address physical needs.  No other concerns noted at this time.   Visit Diagnosis:    ICD-10-CM   1. Bipolar affective disorder, currently depressed, mild (HCC)  F31.31 DULoxetine (CYMBALTA) 60 MG capsule    divalproex (DEPAKOTE) 500 MG DR tablet    QUEtiapine (SEROQUEL) 50 MG tablet    QUEtiapine (SEROQUEL) 400 MG tablet  2. GAD (generalized anxiety disorder)  F41.1 DULoxetine (CYMBALTA) 60 MG capsule    clonazePAM (KLONOPIN) 0.25 MG disintegrating tablet  Past Psychiatric History:  bipolar 1 disorder, bipolar affective disorder, PTSD, schizoaffective disorder, anxiety, depression, and suicidal ideation.   Past Medical History:  Past Medical History:  Diagnosis Date  . Acute renal failure ([place]) 12/16/2019  . Anxiety    per pt report  . Colon cancer ([place])   . Crohn disease ([place])   . Depression    per pt report  . Foot drop, right   . Myocardial infarction ([place])   . PE (pulmonary thromboembolism)  ([place])   . PTSD (post-traumatic stress disorder)   . Thyroid disease     Past Surgical History:  Procedure Laterality Date  . ABDOMINAL SURGERY    . COLON SURGERY    . IR RADIOLOGIST EVAL & MGMT  08/31/2019  . OSTOMY    . SMALL INTESTINE SURGERY      Family Psychiatric History: unknown  Family History: No family history on file.  Social History:  Social History   Socioeconomic History  . Marital status: Divorced    Spouse name: Not on file  . Number of children: Not on file  . Years of education: Not on file  . Highest education level: Not on file  Occupational History  . Not on file  Tobacco Use  . Smoking status: Current Every Day Smoker    Packs/day: 1.00  . Smokeless tobacco: Never Used  Vaping Use  . Vaping Use: Never used  Substance and Sexual Activity  . Alcohol use: Yes  . Drug use: Not Currently  . Sexual activity: Yes  Other Topics Concern  . Not on file  Social History Narrative  . Not on file   Social Determinants of Health   Financial Resource Strain: Not on file  Food Insecurity: Not on file  Transportation Needs: Not on file  Physical Activity: Not on file  Stress: Not on file  Social Connections: Not on file    Allergies:  Allergies  Allergen Reactions  . Azathioprine Other (See Comments)    Pt does not recall reaction    . Ciprofloxacin Other (See Comments)    Pt does not recall reaction    . Mesalamine Other (See Comments)    Pt does not recall reaction  . Metronidazole Other (See Comments)    Pt does not recall reaction   . Sulfa Antibiotics Other (See Comments)    Pt does not recall reaction   . Barium Sulfate Rash  . Barium-Containing Compounds Rash    Metabolic Disorder Labs: Lab Results  Component Value Date   HGBA1C 5.0 05/05/2019   No results found for: PROLACTIN Lab Results  Component Value Date   CHOL 155 05/05/2019   TRIG 202 (H) 05/05/2019   HDL 55 05/05/2019   CHOLHDL 2.8 05/05/2019   LDLCALC 67  05/05/2019   Lab Results  Component Value Date   TSH 0.659 08/18/2019   TSH 0.493 05/05/2019    Therapeutic Level Labs: No results found for: LITHIUM No results found for: VALPROATE No components found for:  CBMZ  Current Medications: Current Outpatient Medications  Medication Sig Dispense Refill  . clonazePAM (KLONOPIN) 0.25 MG disintegrating tablet Take 1 tablet (0.25 mg total) by mouth 2 (two) times daily. 60 tablet 0  . QUEtiapine (SEROQUEL) 400 MG tablet Take 1 tablet (400 mg total) by mouth at bedtime. 60 tablet 2  . QUEtiapine (SEROQUEL) 50 MG tablet Take 1 tablet (50 mg total) by mouth daily. 60 tablet 2  . acetaminophen (TYLENOL) 325 MG tablet Take 2  tablets (650 mg total) by mouth every 6 (six) hours as needed for mild pain (or Fever >/= 101). (Patient not taking: Reported on 03/03/2020)    . cyclobenzaprine (FLEXERIL) 10 MG tablet Take 1 tablet (10 mg total) by mouth 2 (two) times daily as needed for muscle spasms. 30 tablet 0  . diphenoxylate-atropine (LOMOTIL) 2.5-0.025 MG tablet Take 1 tablet by mouth 4 (four) times daily. 90 tablet 0  . divalproex (DEPAKOTE) 500 MG DR tablet Take 1 tablet (500 mg total) by mouth 2 (two) times daily. 120 tablet 2  . DULoxetine (CYMBALTA) 60 MG capsule Take 1 capsule (60 mg total) by mouth daily. 60 capsule 2  . gabapentin (NEURONTIN) 300 MG capsule Take 2 capsules (600 mg total) by mouth 3 (three) times daily. 180 capsule 2  . lidocaine (LIDODERM) 5 % Place 1 patch onto the skin daily. Remove & Discard patch within 12 hours or as directed by MD (Patient not taking: Reported on 01/31/2020) 10 patch 0  . naloxone (NARCAN) nasal spray 4 mg/0.1 mL Place 0.1 sprays (0.4 mg total) into the nose See admin instructions. Use as needed for overdose symptoms from opiate 1 each 1  . oxyCODONE (ROXICODONE) 15 MG immediate release tablet Take 1 tablet (15 mg total) by mouth every 6 (six) hours as needed. 40 tablet 0  . potassium chloride (KLOR-CON) 10 MEQ  tablet Take 1 tablet (10 mEq total) by mouth daily for 5 days. 5 tablet 0  . predniSONE (DELTASONE) 10 MG tablet Take 4 tablets daily for 5 days then stop 20 tablet 0  . simethicone (MYLICON) 80 MG chewable tablet Chew 80 mg by mouth every 6 (six) hours as needed for flatulence.     No current facility-administered medications for this visit.     Musculoskeletal: Strength & Muscle Tone: Unable to assess due to telehealth visit [place]: Unable to assess due to telehealth visit Patient leans: N/A  Psychiatric Specialty Exam: Review of Systems  There were no vitals taken for this visit.There is no height or weight on file to calculate BMI.  General Appearance: Well Groomed  Eye Contact:  Good  Speech:  Clear and Coherent and Normal Rate  Volume:  Normal  Mood:  Anxious and Depressed  Affect:  Congruent  Thought Process:  Coherent, Goal Directed and Linear  Orientation:  Full (Time, Place, and Person)  Thought Content: WDL and Logical   Suicidal Thoughts:  No  Homicidal Thoughts:  No  Memory:  Immediate;   Good Recent;   Good Remote;   Good  Judgement:  Good  Insight:  Good  Psychomotor Activity:  Normal  Concentration:  Concentration: Good and Attention Span: Good  Recall:  Good  Fund of Knowledge: Good  Language: Good  Akathisia:  No  Handed:  Right  AIMS (if indicated): Not done  Assets:  Communication Skills Desire for Improvement Housing Leisure Time  ADL's:  Intact  Cognition: WNL  Sleep:  Poor   Screenings: GAD-7   Flowsheet Row Office Visit from 12/15/2019 in [place] Office Visit from 09/01/2019 in [place] Office Visit from 08/18/2019 in [place] Office Visit from 07/21/2019 in [place] Office Visit from 06/02/2019 in [place]  Total GAD-7 Score 19 21 16 14 12     PHQ2-9   Flowsheet Row Video  Visit from 04/19/2020 in [facility] Office Visit from 12/15/2019 in [place] Office Visit from 09/01/2019 in [place] Office Visit from 08/18/2019 in [place] Office Visit from 07/21/2019 in [place]  PHQ-2 Total Score 6 1 4 2 2   PHQ-9 Total Score 23 3 15 9 9        Assessment and Plan: Patient endorses symptoms of anxiety and depression since discontinuing his medications.  Today he is agreeable to restarting his medications. He will increase Seroquel 350 mg to 400 mg nightly to help mange mood and sleep. He notes that he dislikes hydroxyzine and would like to discontinue it at this time. Patient given a 2 month supply of medications as he notes he can not afford to pick them up monthly. He notes that he will follow up at [facility] to receive his medications at a discounted price. He also notes that he found a coupon at [employer] to pick his Klonopin up for a reduced price.  1. Bipolar affective disorder, currently depressed, mild (HCC)  Restart- DULoxetine (CYMBALTA) 60 MG capsule; Take 1 capsule (60 mg total) by mouth daily.  Dispense: 60 capsule; Refill: 2 Restart- divalproex (DEPAKOTE) 500 MG DR tablet; Take 1 tablet (500 mg total) by mouth 2 (two) times daily.  Dispense: 120 tablet; Refill: 2 Restart- QUEtiapine (SEROQUEL) 50 MG tablet; Take 1 tablet (50 mg total) by mouth daily.  Dispense: 60 tablet; Refill: 2 Restart/Increased- QUEtiapine (SEROQUEL) 400 MG tablet; Take 1 tablet (400 mg total) by mouth at bedtime.  Dispense: 60 tablet; Refill: 2  2. GAD (generalized anxiety disorder)  Restart- DULoxetine (CYMBALTA) 60 MG capsule; Take 1 capsule (60 mg total) by mouth daily.  Dispense: 60 capsule; Refill: 2 Restart- clonazePAM (KLONOPIN) 0.25 MG disintegrating tablet; Take 1 tablet (0.25 mg total) by mouth 2 (two)  times daily.  Dispense: 60 tablet; Refill: 0   Follow-up in 2 month Follow-up with therapy   Salley Slaughter, NP 04/19/2020, 11:14 AM

## 2020-04-23 NOTE — Progress Notes (Signed)
Subjective:    Patient ID: Juan Hester, male    DOB: [birth year], 40 y.o.   MRN: [id number] History of Present Illness:  First OV 07/21/19 This is a complex 40 year old male who just arrived here from the [place] area and currently is living with relatives who presents for evaluation of pulmonary embolism history, Crohn's disease, history of colon carcinoma status post sigmoid resection with colostomy formation, and need for paperwork regarding patient's work status.  The patient currently is living in a hotel.  Patient arrived here in December.  The patient in [place] was under care by gastroenterologist was on Remicade for Crohn's disease and also being followed for his oncology status.  He is due for a colonoscopy at this time.  He works at [employer] he has history of bipolar disorder and had previously been on Cogentin Zyprexa, Depakote and Cymbalta.  Patient is also had major depression as well in the past.  The patient states he has significant volume output from his colostomy with a diarrheal type syndrome and he does have Imodium for this.  The patient has abdominal pain at times.  He has chronic fatigue.  He cannot stand and one place for prolonged periods of time.  He fell at work and his lower back has been difficult for him since that fall.  He was in the emergency room and underwent CT scan of the head and this was negative.  The patient is needing refills on his medications and he does not have insurance at this time.  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  The patient states his back pain is worsening.  He did fall at work and March.  He states the pain was tolerable but now markedly  worse if he stands for long periods of time.  He was not able to use a Lidoderm patch as he cannot reach the center part of his back.  He notes pain shooting down both legs.  He has some difficulty walking as well at this time.  There has not been any lumbar imaging studies done although when he went to the emergency room previously there was a CT scan of the abdomen was done and did not show changes in the lumbar spine but this was a limited study  Patient states his cyclobenzaprine is not useful but he is not taking it on a regular basis The patient has no change in his breathing status and is maintaining his anticoagulant for history of pulmonary emboli.  The patient is yet to achieve a gastroenterology appointment as he needs the orange card first.  He has his financial paperwork together so make an appointment for the financial counselor  08/18/2019 The patient comes in with lower extremity edema and is just gone to the ER 1 day ago for progressive back pain which occurred on the job.  He has since been let go from the job he was working.  He has another job as well he works.  The patient is yet to receive his lumbar x-rays as ordered.  He does not have financial assistance as of yet to be able to achieve an MRI or orthopedics appointment.  Patient notes increased edema in the lower extremities and some shortness of breath with exertion. Note I reviewed his CT scan of the chest March which did not show pulmonary emboli but did show atelectasis at the bases of the lungs.   Wt Readings from Last 3 Encounters: 08/18/19 : 274 lb (124.3 kg) 07/21/19 : 264 lb 3.2 oz (119.8 kg) 07/11/19 : 260 lb (117.9 kg)  Patient cannot sleep flat is still smoking half pack a day of cigarettes denies wheezing slight productive cough of green mucus no sinus complaints  09/01/2019 This patient is seen in return follow-up and has been to the emergency room twice since I last saw him several weeks ago.  Patient has  continued pain in the lower back.  He has a normal lumbar study.  He had fallen at work.  He is now been fired from his job.  He has retained an attorney for [employer].  The patient states his pain is more in the upper thoracic area as well as in the lumbar area at this time.  Note he also went to interventional radiology they recommend not starting in this patient with an in fear vena cava filter but instead study his lower extremities first.  He is yet to have a vascular study the lower extremities for DVT.  He is now off Xarelto because he was bleeding from an opening of the rectal closure from his previous total colectomy.  The patient still has some difficulty in that area but the bleeding is less off the Xarelto.  Patient is also seeing gastroenterology and they are awaiting further evaluation of the rectal tear.  The patient is yet to see a surgeon for this.  He is here today for follow-up.  10/19/2019 This patient is seen in return follow-up and notes continued low back pain with radiculopathy down the left lower extremity.  He saw orthopedics and they recommended an injection however they said that they could not perform it under his Workmen's Comp. agreement.  He is unable to afford any type of procedure as he is self-pay at this time.  He is also wishing a second opinion on his back.  In addition to this he has had increased depression and mood swings and this ultimately resulted in increased alcohol use when he was with a friend over the weekend and when the friend left him he became extremely despondent and started thinking of suicidal ideation.  He called for help and was brought to the emergency room where he had a 36-hour stay in involuntary commitment until he can be cleared by psychiatry which did occurred he was just discharged 24 hours ago.  He was given a paper prescription for Seroquel and also for his Depakote however he cannot afford these medications at this time.  He  was also not given a refill on Cymbalta and has run out of the Cymbalta.  He also has run out of his oxycodone as well for his back pain.  He does have a supply of Xarelto and maintains this 20 mg daily without any additional bleeding noted in the rectal area.  When he came to the ER he was very much confused had been drinking excessively and somewhat altered he did clear this out and he has not had further drinking since that time.  He states when he uses the oxycodone he only takes it about 3-4 times daily.  He states the cyclobenzaprine was of no use to him.  He  has had several falls because of left-sided weakness in the lower extremity.  He feels like there is electric shock going down his leg and this resulted in the falls.  He has bruised his left shoulder with an injury and a fall in his bathroom. He was unable to obtain transportation therefore this visit was done via video Note the psychiatry doctor did discontinue his Zyprexa in favor of use of Seroquel 200 mg daily.  He does have follow-up with a behavioral therapist and psychiatrist in the next 24 hours at [facility].  11/01/2019 This is a video visit and follow-up from her previous video visit several weeks ago.  The patient attempted to get to see neurosurgery but they were going to charge her co-pay he was not able to achieve that visit.  The patient is no longer drinking alcohol regularly.  He still has significant anxiety.  He did see the mental health providers at the clinic and they prescribed hydroxyzine for anxiety and this has not helped his symptoms.  He is wishing to get back on the some type of benzodiazepine.  He is seeing a Social worker at this time.  He denies any bleeding from the rectal area.  He still has low back pain.  He is using the oxycodone as needed for pain and only minimally using this.  He has called his lawyer deceiving get Workmen's Comp. to cover the neurosurgical  evaluation.  12/02/2019 I attempted to have a video visit with this patient he was in his hotel and appeared to be under some influence.  He could not focus or listen properly to the interaction.  He was stating he was having new onset shoulder pain and a cough.  He states his back is still tender.  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  He is trying to save up his money so that he can undergo spinal evaluations.  He is on a fairly high dose of oxycodone and this will need to be reduced.  I discussed with him the need and the importance of changing his pain medication.  He took this under advisement.  It was difficult to proceed with any other elements of this visit so I told the patient we would follow-up with him in the office setting  12/15/2019 This patient returns in follow-up and has severe pain and now is developed foot drop on the right lower extremity.  He has yet to make his orthopedic appointment.  He has lost 35 pounds in weight.  He has a sore on the dorsal aspect of his right foot with surrounding erythema that he has noticed.  His pain is worse in the lower back area.  Note he does have significant lumbar disease on the MRI we previously have obtained.  He is out of his pain medications at this time.  I explained to him in order to maintain him on pain medicine we need to have him come in for regular face-to-face visit so that we can obtain a urine drug screen.  He says he is not using other substances.  He does state that his mental health became a little unbalanced when he stopped taking some of his medicines but now is back on his mental health medications He states on the Xarelto he occasionally has some blood from his colostomy area and rectal areas  The patient says the foot drop occurred several weeks ago and he had to crawl about  his apartment and developed sores on his feet and knees he says he is able to walk better now it suddenly changed  12/27/2019 This patient  seen back in return follow-up after hospitalization between the second and 6 September.  During the hospitalization was found to have acute renal failure from severe volume depletion related to high colostomy output.  There was no evidence of active infection.  The patient had electrolyte derangements and elevated creatinine.  He was given IV fluids and told to follow-up.  He states since he has been home he still remains weak but he is improved compared to before.  He has less nausea.  He does have some black stools coming out of the ostomy.  He notes if he talks a lot his throat will gag and feel dry.  His pain in his lower back is not a big issue as it is in the lower extremity and will on the left. Below is a copy of the discharge summary  Admit date: 12/16/2019 Discharge date: 12/20/2019  Time spent: 35 minutes  Recommendations for Outpatient Follow-up:  1. PCP Dr. Joya Gaskins in 1 week, consider weaning narcotics, please check BMP in 1 week, emphasized compliance with Lomotil    Discharge Diagnoses:  Principal Problem:   Acute renal failure ([place]) High colostomy output   Crohn's disease with complication ([place])   History of colon cancer   ETOH abuse   Schizoaffective disorder, bipolar type ([place])   Abscess or cellulitis of foot   Colostomy status ([place])   History of pulmonary embolism   Nausea and vomiting   Discharge Condition: Stable  Diet recommendation: Regular  Filed Weights  12/16/19 2113 12/18/19 0402 Weight: 115.7 kg 118.1 kg   History of present illness:  Juan Ritteris a 39/M wCrohn's disease, colon cancer s/p L hemicolectomy and ostomy, prior multifocal PEs on Xarelto, schizoaffective disorder, alcohol use recent L4-5 injury with right foot drop. Patient was sent to the ED on 12/16/2019 from PCPs office for abnormal labs. 10 days prior to presentation, patient started 'fasting for spiritual reasons' where he did not eat anything but drank plenty of fluids. In  about a week, he started having bilious vomiting. He gave up fasting but could not tolerate any food. 9/1, pt went to PCPs office for right foot wound. Routine labs showed elevated creatinine and hence patient was sent to the ED. Also reports chronic freq stools/ostomy output>10times/day at baseline, supposed to be on lomotilbut has not taken this in the past few weeks. In the ED, hemodynamically stable Labs noted creatinine elevated to 3.48, baseline 1.2, lipase elevated to 278, LFT normal. CT scan of abdomen pelvis did not show any acute abnormality  Hospital Course:   Acute kidney injury  -Admitted with creatinine of 3.5 up from baseline of 1.2  -due to dehydration related to poor oral intake and chronic GI losses, has high output from his colostomy and daily naproxen use -Hydrated with normal saline, Lomotil added 4 times daily -Kidney function improving, creatinine down to 1.9 today -Follow-up with Dr. Joya Gaskins in 1 week, check BMP at follow-up  H/o PE -Xarelto held in the setting of AKI and was briefly on a heparin bridge instead, since kidney function is improving discussed with Pharm.D. will restart Xarelto at discharge  Hypomagnesemia, hypokalemia -Replaced  Intractable nausea vomiting -Resolved, -CT abdomen pelvis was unremarkable  Right foot scab -No evidence of secondary infection, does not need antibiotics for it  at this time  History of Crohn's disease,  colon cancer s/p colectomy -No evidence of obstruction CT abdomen pelvis. It showed some parasternal hernia and mild ileus likely chronic. -Ostomy nurse consulted -Follow-up with gastroenterology, patient reports that he is unable to do so since he does not have Medicaid yet in [place]  Recent L4-L5 injury with right foot drop -Patient walks with a walker. -On pain control with oxycodone 15 mg every 6 hours at baseline, this was continued, per nursing staff was requesting IV narcotics while  inpatient  Alcohol use -Patient denies daily alcohol use, no withdrawal noted  Schizoaffective disorder  Continue seroquel, Depakote, Cymbalta.  There is been a transition of care visit with the case manager last week on the ninth.  Note the patient went to urgent care on the eighth and had follow-up labs which were stable.   The patient is working attorney to see if he can get disability or Workmen's Compensation for his back disease so he can see a Licensed conveyancer.  He knows without insurance he is not able to access any specialist at this time.  We have been getting his medications to him for free through the patient assistance fund  10/18 This patient is seen in return follow-up today complaining of severe right foot pain and inability to walk properly. He has severe lumbar radiculopathy and is yet to see a spine specialist. He states his workman compensation payout may occur in the next several days. He ran out of the oxycodone 15 mg and a refill for Percocet was given. He pushed back very hard on this at this visit stating this does not work for him. I engaged with the patient and told him that this was what I was comfortable prescribing given his very high risk due to multiple medications interacting resulting in potential high abuse threshold and also potential side effect of unconsciousness  He states he still has a high output from his ostomy and ran out of the Lomotil I indicated we would refill this  I also indicated to the patient he has been using a patient assistance phone which is short-term only and we have been waiting to try to get him either [place] assistance or application for disability. He states his disability is in process but may not come through anytime soon  04/24/20 Patient seen in return follow-up from the last visit in October 2021.  The patient did see neurosurgery as recommended and they did not feel he had a neurosurgical issue.  They were concerned that his  foot drop and foot pain was from a peripheral neuropathy and recommended neurology.  The patient does have a scheduled appointment with neurology in February when he can save up enough money for the co-pay.  He is interested now in pursuing financial assistance the orange card Blue card [facility] health discount and he has given up applying for disability.  Note we have had several discussions about my inability to continue chronically providing him opiates at a dose of 15 mg oxycodone 4 times daily.  I just gave him another 10-day supply of this medication late last week.  I also indicated to him I can no longer be his primary care provider if I am to be prescribing him chronic oxycodone.  He is willing to see the neurologist undergo the necessary tests and subsequent to this if pain medicines are continued he is willing to see another primary care provider that we will do this.  Another alternative is to go to a pain clinic.  I did review the neurosurgery note he does not have significant neurologic deficits and the MRI of the lumbar does not show sufficient pathology that correlates with his foot drop and his right foot pain.  Note he did stop the Xarelto for his pulmonary emboli that occurred in February of this year and follow-up CT scanning in October showed no recurrence and resolution of all emboli  Patient states his colostomy output is adequate with use of Lomotil  The patient has no other specific complaints at this time.  Note the patient did follow-up with the mental health provider and has refills on all of his mental health medications and is willing to take these medicines and follow-up with his therapist  The patient is now out of the hotel and now staying in an apartment     Past Medical History:  Diagnosis Date  . Acute renal failure ([place]) 12/16/2019  . Anxiety    per pt report  . Colon cancer ([place])   . Crohn disease ([place])   . Depression    per pt report  . ETOH abuse  10/16/2019  . Foot drop, right   . Multiple subsegmental pulmonary emboli without acute cor pulmonale ([place]) 07/21/2019  . Myocardial infarction ([place])   . PE (pulmonary thromboembolism) ([place])   . PTSD (post-traumatic stress disorder)   . Thyroid disease      History reviewed. No pertinent family history.   Social History   Socioeconomic History  . Marital status: Divorced    Spouse name: Not on file  . Number of children: Not on file  . Years of education: Not on file  . Highest education level: Not on file  Occupational History  . Not on file  Tobacco Use  . Smoking status: Current Every Day Smoker    Packs/day: 1.00  . Smokeless tobacco: Never Used  Vaping Use  . Vaping Use: Never used  Substance and Sexual Activity  . Alcohol use: Yes  . Drug use: Not Currently  . Sexual activity: Yes  Other Topics Concern  . Not on file  Social History Narrative  . Not on file   Social Determinants of Health   Financial Resource Strain: Not on file  Food Insecurity: Not on file  Transportation Needs: Not on file  Physical Activity: Not on file  Stress: Not on file  Social Connections: Not on file  Intimate Partner Violence: Not on file     Allergies  Allergen Reactions  . Azathioprine Other (See Comments)    Pt does not recall reaction    . Ciprofloxacin Other (See Comments)    Pt does not recall reaction    . Mesalamine Other (See Comments)    Pt does not recall reaction  . Metronidazole Other (See Comments)    Pt does not recall reaction   . Sulfa Antibiotics Other (See Comments)    Pt does not recall reaction   . Barium Sulfate Rash  . Barium-Containing Compounds Rash     Outpatient Medications Prior to Visit  Medication Sig Dispense Refill  . acetaminophen (TYLENOL) 325 MG tablet Take 2 tablets (650 mg total) by mouth every 6 (six) hours as needed for mild pain (or Fever >/= 101).    . clonazePAM (KLONOPIN) 0.25 MG disintegrating tablet Take 1 tablet (0.25 mg  total) by mouth 2 (two) times daily. (Patient taking differently: Take 0.25 mg by mouth 2 (two) times daily as needed.) 60 tablet 0  . diphenoxylate-atropine (LOMOTIL) 2.5-0.025 MG  tablet Take 1 tablet by mouth 4 (four) times daily. 90 tablet 0  . divalproex (DEPAKOTE) 500 MG DR tablet Take 1 tablet (500 mg total) by mouth 2 (two) times daily. 120 tablet 2  . DULoxetine (CYMBALTA) 60 MG capsule Take 1 capsule (60 mg total) by mouth daily. 60 capsule 2  . gabapentin (NEURONTIN) 300 MG capsule Take 2 capsules (600 mg total) by mouth 3 (three) times daily. 180 capsule 2  . lidocaine (LIDODERM) 5 % Place 1 patch onto the skin daily. Remove & Discard patch within 12 hours or as directed by MD 10 patch 0  . naloxone (NARCAN) nasal spray 4 mg/0.1 mL Place 0.1 sprays (0.4 mg total) into the nose See admin instructions. Use as needed for overdose symptoms from opiate 1 each 1  . oxyCODONE (ROXICODONE) 15 MG immediate release tablet Take 1 tablet (15 mg total) by mouth every 6 (six) hours as needed. 40 tablet 0  . QUEtiapine (SEROQUEL) 400 MG tablet Take 1 tablet (400 mg total) by mouth at bedtime. 60 tablet 2  . QUEtiapine (SEROQUEL) 50 MG tablet Take 1 tablet (50 mg total) by mouth daily. 60 tablet 2  . simethicone (MYLICON) 80 MG chewable tablet Chew 80 mg by mouth every 6 (six) hours as needed for flatulence.    . cyclobenzaprine (FLEXERIL) 10 MG tablet Take 1 tablet (10 mg total) by mouth 2 (two) times daily as needed for muscle spasms. 30 tablet 0  . potassium chloride (KLOR-CON) 10 MEQ tablet Take 1 tablet (10 mEq total) by mouth daily for 5 days. 5 tablet 0   No facility-administered medications prior to visit.      Review of Systems  Constitutional: Positive for activity change.  HENT: Negative.   Eyes: Negative.   Respiratory: Negative for cough, shortness of breath and wheezing.   Cardiovascular: Negative for palpitations and leg swelling.  Gastrointestinal: Positive for diarrhea. Negative  for abdominal distention, anal bleeding, blood in stool, constipation, nausea, rectal pain and vomiting.  Endocrine: Negative.   Genitourinary: Negative.   Musculoskeletal: Positive for arthralgias, back pain, gait problem and joint swelling.  Neurological: Positive for dizziness and weakness.  Psychiatric/Behavioral: Positive for sleep disturbance. Negative for agitation, behavioral problems, decreased concentration, dysphoric mood, self-injury and suicidal ideas. The patient is not nervous/anxious and is not hyperactive.        Objective:   Physical Exam Vitals:   04/24/20 1509  BP: 117/77  Pulse: (!) 109  Resp: 16  Temp: 98.2 F (36.8 C)  SpO2: 97%  Weight: 246 lb (111.6 kg)    Gen: Pleasant, well-nourished, in no distress,  normal affect  ENT: No lesions,  mouth clear,  oropharynx clear, no postnasal drip  Neck: No JVD, no TMG, no carotid bruits  Lungs: No use of accessory muscles, no dullness to percussion, clear without rales or rhonchi  Cardiovascular: RRR, heart sounds normal, no murmur or gallops, no peripheral edema  Abdomen: soft and NT, no HSM,  BS normal  Musculoskeletal: No deformities, no cyanosis or clubbing  Neuro: alert, non focal, there are sensory changes in the right foot but no significant weakness seen  Neurosurgery note is reviewed  Skin: Warm, healing excoriations on both dorsum of both feet    BMP Latest Ref Rng & Units 02/07/2020 12/22/2019 12/20/2019  Glucose 70 - 99 mg/dL 104(H) 164(H) 116(H)  BUN 6 - 20 mg/dL _0 Creatinine 0.61 - 1.24 mg/dL 1.24 1.64(H) 1.93(H)  BUN/Creat Ratio 9 -  20 - - -  Sodium 135 - 145 mmol/L 138 138 135  Potassium 3.5 - 5.1 mmol/L 2.8(L) 3.4(L) 3.6  Chloride 98 - 111 mmol/L 100 100 101  CO2 22 - 32 mmol/L _0 Calcium 8.9 - 10.3 mg/dL 8.5(L) 9.1 8.2(L)   CBC Latest Ref Rng & Units 02/07/2020 12/22/2019 12/20/2019  WBC 4.0 - 10.5 K/uL 8.5 6.8 2.6(L)  Hemoglobin 13.0 - 17.0 g/dL 12.8(L) 12.0(L) 9.7(L)   Hematocrit 39.0 - 52.0 % 37.7(L) 36.4(L) 29.7(L)  Platelets 150 - 400 K/uL 198 156 80(L)   Hepatic Function Latest Ref Rng & Units 02/07/2020 12/22/2019 12/17/2019  Total Protein 6.5 - 8.1 g/dL 6.7 7.2 7.1  Albumin 3.5 - 5.0 g/dL 3.5 3.3(L) 3.1(L)  AST 15 - 41 U/L 49(H) 43(H) 32  ALT 0 - 44 U/L 60(H) 28 31  Alk Phosphatase 38 - 126 U/L 121 109 86  Total Bilirubin 0.3 - 1.2 mg/dL 1.1 0.6 1.0  Bilirubin, Direct 0.00 - 0.40 mg/dL - - -       Assessment & Plan:  I personally reviewed all images and lab data in the [facility] system as well as any outside material available during this office visit and agree with the  radiology impressions.   Multiple subsegmental pulmonary emboli without acute cor pulmonale (HCC) Pulmonary emboli have resolved patient is off Xarelto last CT scan was negative  Will follow off anticoagulants for now  Crohn's disease with complication ([place]) Stable at this time continue Lomotil as needed  Protrusion of lumbar intervertebral disc Protrusion of disc it does not appear to be significant enough to warrant surgery and pain in the right foot does not appear to be related to this  Patient needs neurology evaluation with nerve conduction studies  Bipolar affective disorder, currently depressed, mild ([place]) Bipolar disorder currently under the care of psychiatry patient is agreed to take his medications  Chronic pain syndrome Chronic pain syndrome which appears to be neuropathic pain in the right foot  Patient agreeable to see neurology  I have agreed to refill for 20 more days beyond the additional 10 days adjustability of his oxycodone until he can get in with neurology and get the nerve conduction study  Patient agrees to a urine drug screen at this visit  Will give low-dose hydroxyzine for itching related to the patient's opiate use  For the rash on the dorsum of the right foot will give calamine lotion topically  Will also refill the Flexeril   drug  database is checked Patient has existing pain contract with this clinic  Homelessness Patient now has housing   [place] was seen today for follow-up.  Diagnoses and all orders for this visit:  Chronic pain syndrome -     [id number]+Oxyco+Alc+Crt-Bund  Multiple subsegmental pulmonary emboli without acute cor pulmonale (HCC)  Crohn's disease with complication, unspecified gastrointestinal tract location [facility])  Protrusion of lumbar intervertebral disc  Bipolar affective disorder, currently depressed, mild ([place])  Homelessness  Other orders -     hydrOXYzine (ATARAX/VISTARIL) 10 MG tablet; Take 1 tablet (10 mg total) by mouth 3 (three) times daily as needed. -     calamine lotion; Apply 1 application topically as needed for itching. On top of right foot -     cyclobenzaprine (FLEXERIL) 10 MG tablet; Take 1 tablet (10 mg total) by mouth 2 (two) times daily as needed for muscle spasms.

## 2020-04-24 ENCOUNTER — Encounter: Payer: Self-pay | Admitting: Critical Care Medicine

## 2020-04-24 ENCOUNTER — Ambulatory Visit: Payer: MEDICAID | Attending: Critical Care Medicine | Admitting: Critical Care Medicine

## 2020-04-24 ENCOUNTER — Other Ambulatory Visit: Payer: Self-pay | Admitting: Critical Care Medicine

## 2020-04-24 ENCOUNTER — Other Ambulatory Visit: Payer: Self-pay

## 2020-04-24 VITALS — BP 117/77 | HR 109 | Temp 98.2°F | Resp 16 | Wt 246.0 lb

## 2020-04-24 DIAGNOSIS — Z5900 Homelessness unspecified: Secondary | ICD-10-CM

## 2020-04-24 DIAGNOSIS — F3131 Bipolar disorder, current episode depressed, mild: Secondary | ICD-10-CM

## 2020-04-24 DIAGNOSIS — K50919 Crohn's disease, unspecified, with unspecified complications: Secondary | ICD-10-CM

## 2020-04-24 DIAGNOSIS — M5126 Other intervertebral disc displacement, lumbar region: Secondary | ICD-10-CM

## 2020-04-24 DIAGNOSIS — I2694 Multiple subsegmental pulmonary emboli without acute cor pulmonale: Secondary | ICD-10-CM

## 2020-04-24 DIAGNOSIS — G894 Chronic pain syndrome: Secondary | ICD-10-CM

## 2020-04-24 MED ORDER — HYDROXYZINE HCL 10 MG PO TABS: 10.0000 mg | ORAL_TABLET | Freq: Three times a day (TID) | ORAL | 0 refills | Status: DC | PRN

## 2020-04-24 MED ORDER — CYCLOBENZAPRINE HCL 10 MG PO TABS: 10.0000 mg | ORAL_TABLET | Freq: Two times a day (BID) | ORAL | 0 refills | Status: DC | PRN

## 2020-04-24 MED ORDER — CALAMINE EX LOTN: 1.0000 "application " | TOPICAL_LOTION | CUTANEOUS | 0 refills | Status: DC | PRN

## 2020-04-24 MED FILL — hydrOXYzine HCL 10 MG TABS: 10 | 20 days supply | Qty: 60 | Fill #0

## 2020-04-24 MED FILL — CYCLOBENZAPRINE 10 MG TAB: 10 | 15 days supply | Qty: 30 | Fill #0

## 2020-04-24 NOTE — Assessment & Plan Note (Signed)
Protrusion of disc it does not appear to be significant enough to warrant surgery and pain in the right foot does not appear to be related to this  Patient needs neurology evaluation with nerve conduction studies

## 2020-04-24 NOTE — Assessment & Plan Note (Signed)
Patient now has housing

## 2020-04-24 NOTE — Assessment & Plan Note (Signed)
Bipolar disorder currently under the care of psychiatry patient is agreed to take his medications

## 2020-04-24 NOTE — Assessment & Plan Note (Signed)
Pulmonary emboli have resolved patient is off Xarelto last CT scan was negative  Will follow off anticoagulants for now

## 2020-04-24 NOTE — Assessment & Plan Note (Addendum)
Chronic pain syndrome which appears to be neuropathic pain in the right foot  Patient agreeable to see neurology  I have agreed to refill for 20 more days beyond the additional 10 days adjustability of his oxycodone until he can get in with neurology and get the nerve conduction study  Patient agrees to a urine drug screen at this visit  Will give low-dose hydroxyzine for itching related to the patient's opiate use  For the rash on the dorsum of the right foot will give calamine lotion topically  Will also refill the Flexeril   drug database is checked Patient has existing pain contract with this clinic

## 2020-04-24 NOTE — Assessment & Plan Note (Signed)
Stable at this time continue Lomotil as needed

## 2020-04-24 NOTE — Patient Instructions (Signed)
Drug screen will be obtained today Hydroxyzine 10 mg 3 times a day as needed for itching was sent to the pharmacy Calamine lotion or equivalent was sent to the pharmacy to apply to the dorsum of the right foot for itching and rash  Please keep your upcoming appointments with your behavioral therapist and neurology  I will continue to see you as a patient for an additional 1 month pending the appointments with neurology with the treatment plan that we we will transfer your care to another pain management provider depending upon what neurology determines it is necessary for your treatment plan  I can only give you a 10-day supply of your pain medicine at a time I will continue that for 2 more refills through this period of time  No change in your other medications for now   A video visit will be scheduled within the next 4 weeks

## 2020-04-28 ENCOUNTER — Other Ambulatory Visit: Payer: Self-pay

## 2020-04-28 ENCOUNTER — Telehealth: Payer: Self-pay | Admitting: Critical Care Medicine

## 2020-04-28 MED ORDER — OXYCODONE HCL 15 MG PO TABS: 15.0000 mg | ORAL_TABLET | Freq: Four times a day (QID) | ORAL | 0 refills | Status: DC | PRN

## 2020-04-28 NOTE — Telephone Encounter (Signed)
Pt called back after checking with pharmacy about the medication in the note below/ FC advised me when I called that Dr. Joya Gaskins was seeing pts this afternoon and a note was setn to him/ I advised pt of this and the Pt stated tat Dr. Joya Gaskins told him he was not working today and there is no schedule for him today / please advise asap / Pt is trying to have this error fixed before closing today

## 2020-04-28 NOTE — Telephone Encounter (Signed)
Patient is calling to check on the status of his medication refill. Patient was aware that Dr. Joya Gaskins was not in the office today. I was told that Dr Joya Gaskins had to respond to the message. Please advise

## 2020-04-28 NOTE — Telephone Encounter (Signed)
Patient called in about his prescription for oxyCODONE (ROXICODONE). In his chart it looks like the prescription was printed instead of sent to the [facility]. Can this be sent over? Please advise.  Juan Hester [place] [id number]

## 2020-04-29 MED ORDER — OXYCODONE HCL 15 MG PO TABS: 15.0000 mg | ORAL_TABLET | Freq: Four times a day (QID) | ORAL | 0 refills | Status: DC | PRN

## 2020-04-29 NOTE — Telephone Encounter (Signed)
Rx resent.

## 2020-05-01 MED FILL — clonazePAM 0.5 MG TABS: 0.5 | 30 days supply | Qty: 30 | Fill #4

## 2020-05-03 ENCOUNTER — Emergency Department (HOSPITAL_COMMUNITY): Payer: Self-pay

## 2020-05-03 ENCOUNTER — Inpatient Hospital Stay (HOSPITAL_COMMUNITY)
Admission: EM | Admit: 2020-05-03 | Discharge: 2020-05-10 | DRG: 917 | Disposition: A | Discharge status: Other Acute Inpatient Hospital | Payer: Self-pay | Attending: Internal Medicine | Admitting: Internal Medicine

## 2020-05-03 ENCOUNTER — Ambulatory Visit: Payer: Self-pay

## 2020-05-03 DIAGNOSIS — N179 Acute kidney failure, unspecified: Secondary | ICD-10-CM | POA: Diagnosis present

## 2020-05-03 DIAGNOSIS — F17210 Nicotine dependence, cigarettes, uncomplicated: Secondary | ICD-10-CM | POA: Diagnosis present

## 2020-05-03 DIAGNOSIS — Z79899 Other long term (current) drug therapy: Secondary | ICD-10-CM

## 2020-05-03 DIAGNOSIS — T50902A Poisoning by unspecified drugs, medicaments and biological substances, intentional self-harm, initial encounter: Secondary | ICD-10-CM

## 2020-05-03 DIAGNOSIS — Y907 Blood alcohol level of 200-239 mg/100 ml: Secondary | ICD-10-CM | POA: Diagnosis present

## 2020-05-03 DIAGNOSIS — T402X2A Poisoning by other opioids, intentional self-harm, initial encounter: Secondary | ICD-10-CM | POA: Diagnosis present

## 2020-05-03 DIAGNOSIS — Z978 Presence of other specified devices: Secondary | ICD-10-CM

## 2020-05-03 DIAGNOSIS — E876 Hypokalemia: Secondary | ICD-10-CM | POA: Diagnosis not present

## 2020-05-03 DIAGNOSIS — T43212A Poisoning by selective serotonin and norepinephrine reuptake inhibitors, intentional self-harm, initial encounter: Secondary | ICD-10-CM | POA: Diagnosis present

## 2020-05-03 DIAGNOSIS — T50901A Poisoning by unspecified drugs, medicaments and biological substances, accidental (unintentional), initial encounter: Secondary | ICD-10-CM | POA: Diagnosis present

## 2020-05-03 DIAGNOSIS — F10129 Alcohol abuse with intoxication, unspecified: Secondary | ICD-10-CM | POA: Diagnosis present

## 2020-05-03 DIAGNOSIS — G9341 Metabolic encephalopathy: Secondary | ICD-10-CM | POA: Diagnosis present

## 2020-05-03 DIAGNOSIS — Z933 Colostomy status: Secondary | ICD-10-CM

## 2020-05-03 DIAGNOSIS — Z882 Allergy status to sulfonamides status: Secondary | ICD-10-CM

## 2020-05-03 DIAGNOSIS — G8929 Other chronic pain: Secondary | ICD-10-CM | POA: Diagnosis present

## 2020-05-03 DIAGNOSIS — F259 Schizoaffective disorder, unspecified: Secondary | ICD-10-CM | POA: Diagnosis present

## 2020-05-03 DIAGNOSIS — K5090 Crohn's disease, unspecified, without complications: Secondary | ICD-10-CM | POA: Diagnosis present

## 2020-05-03 DIAGNOSIS — J9601 Acute respiratory failure with hypoxia: Secondary | ICD-10-CM | POA: Diagnosis present

## 2020-05-03 DIAGNOSIS — Z20822 Contact with and (suspected) exposure to covid-19: Secondary | ICD-10-CM | POA: Diagnosis present

## 2020-05-03 DIAGNOSIS — T481X2A Poisoning by skeletal muscle relaxants [neuromuscular blocking agents], intentional self-harm, initial encounter: Secondary | ICD-10-CM | POA: Diagnosis present

## 2020-05-03 DIAGNOSIS — Z881 Allergy status to other antibiotic agents status: Secondary | ICD-10-CM

## 2020-05-03 DIAGNOSIS — Z888 Allergy status to other drugs, medicaments and biological substances status: Secondary | ICD-10-CM

## 2020-05-03 DIAGNOSIS — Z86711 Personal history of pulmonary embolism: Secondary | ICD-10-CM

## 2020-05-03 DIAGNOSIS — F4310 Post-traumatic stress disorder, unspecified: Secondary | ICD-10-CM | POA: Diagnosis present

## 2020-05-03 DIAGNOSIS — T43592A Poisoning by other antipsychotics and neuroleptics, intentional self-harm, initial encounter: Principal | ICD-10-CM | POA: Diagnosis present

## 2020-05-03 DIAGNOSIS — I252 Old myocardial infarction: Secondary | ICD-10-CM

## 2020-05-03 DIAGNOSIS — F319 Bipolar disorder, unspecified: Secondary | ICD-10-CM | POA: Diagnosis present

## 2020-05-03 DIAGNOSIS — E162 Hypoglycemia, unspecified: Secondary | ICD-10-CM | POA: Diagnosis present

## 2020-05-03 DIAGNOSIS — T424X2A Poisoning by benzodiazepines, intentional self-harm, initial encounter: Secondary | ICD-10-CM | POA: Diagnosis present

## 2020-05-03 DIAGNOSIS — Z85038 Personal history of other malignant neoplasm of large intestine: Secondary | ICD-10-CM

## 2020-05-03 LAB — COMPREHENSIVE METABOLIC PANEL
ALT: 66 U/L — ABNORMAL HIGH (ref 0–44)
AST: 55 U/L — ABNORMAL HIGH (ref 15–41)
Albumin: 3.2 g/dL — ABNORMAL LOW (ref 3.5–5.0)
Alkaline Phosphatase: 97 U/L (ref 38–126)
Anion gap: 9 (ref 5–15)
BUN: 9 mg/dL (ref 6–20)
CO2: 23 mmol/L (ref 22–32)
Calcium: 8.5 mg/dL — ABNORMAL LOW (ref 8.9–10.3)
Chloride: 106 mmol/L (ref 98–111)
Creatinine, Ser: 1.29 mg/dL — ABNORMAL HIGH (ref 0.61–1.24)
GFR, Estimated: >60 mL/min (ref >60)
Glucose, Bld: 111 mg/dL — ABNORMAL HIGH (ref 70–99)
Potassium: 3.6 mmol/L (ref 3.5–5.1)
Sodium: 138 mmol/L (ref 135–145)
Total Bilirubin: 0.7 mg/dL (ref 0.3–1.2)
Total Protein: 6.3 g/dL — ABNORMAL LOW (ref 6.5–8.1)

## 2020-05-03 LAB — CBC WITH DIFFERENTIAL/PLATELET
Abs Immature Granulocytes: 0.05 10*3/uL (ref 0.00–0.07)
Basophils Absolute: 0 10*3/uL (ref 0.0–0.1)
Basophils Relative: 0 %
Eosinophils Absolute: 0.1 10*3/uL (ref 0.0–0.5)
Eosinophils Relative: 1 %
HCT: 38.1 % — ABNORMAL LOW (ref 39.0–52.0)
Hemoglobin: 13.7 g/dL (ref 13.0–17.0)
Immature Granulocytes: 1 %
Lymphocytes Relative: 10 %
Lymphs Abs: 0.7 10*3/uL (ref 0.7–4.0)
MCH: 35.3 pg — ABNORMAL HIGH (ref 26.0–34.0)
MCHC: 36 g/dL (ref 30.0–36.0)
MCV: 98.2 fL (ref 80.0–100.0)
Monocytes Absolute: 0.6 10*3/uL (ref 0.1–1.0)
Monocytes Relative: 9 %
Neutro Abs: 5.4 10*3/uL (ref 1.7–7.7)
Neutrophils Relative %: 79 %
Platelets: 129 10*3/uL — ABNORMAL LOW (ref 150–400)
RBC: 3.88 MIL/uL — ABNORMAL LOW (ref 4.22–5.81)
RDW: 13.9 % (ref 11.5–15.5)
WBC: 6.8 10*3/uL (ref 4.0–10.5)
nRBC: 0 % (ref 0.0–0.2)

## 2020-05-03 LAB — I-STAT ARTERIAL BLOOD GAS, ED
Acid-base deficit: 3 mmol/L — ABNORMAL HIGH (ref 0.0–2.0)
Acid-base deficit: 2 mmol/L (ref 0.0–2.0)
Bicarbonate: 24.4 mmol/L (ref 20.0–28.0)
Bicarbonate: 23.6 mmol/L (ref 20.0–28.0)
Calcium, Ion: 1.19 mmol/L (ref 1.15–1.40)
Calcium, Ion: 1.16 mmol/L (ref 1.15–1.40)
HCT: 37 % — ABNORMAL LOW (ref 39.0–52.0)
HCT: 38 % — ABNORMAL LOW (ref 39.0–52.0)
Hemoglobin: 12.6 g/dL — ABNORMAL LOW (ref 13.0–17.0)
Hemoglobin: 12.9 g/dL — ABNORMAL LOW (ref 13.0–17.0)
O2 Saturation: 82 %
O2 Saturation: 100 %
Patient temperature: 98.6
Potassium: 3.2 mmol/L — ABNORMAL LOW (ref 3.5–5.1)
Potassium: 3.9 mmol/L (ref 3.5–5.1)
Sodium: 141 mmol/L (ref 135–145)
Sodium: 141 mmol/L (ref 135–145)
TCO2: 26 mmol/L (ref 22–32)
TCO2: 25 mmol/L (ref 22–32)
pCO2 arterial: 54 mmHg — ABNORMAL HIGH (ref 32.0–48.0)
pCO2 arterial: 42 mmHg (ref 32.0–48.0)
pH, Arterial: 7.263 — ABNORMAL LOW (ref 7.350–7.450)
pH, Arterial: 7.357 (ref 7.350–7.450)
pO2, Arterial: 54 mmHg — ABNORMAL LOW (ref 83.0–108.0)
pO2, Arterial: 415 mmHg — ABNORMAL HIGH (ref 83.0–108.0)

## 2020-05-03 LAB — RESP PANEL BY RT-PCR (FLU A&B, COVID) ARPGX2
Influenza A by PCR: NEGATIVE
Influenza B by PCR: NEGATIVE
SARS Coronavirus 2 by RT PCR: NEGATIVE

## 2020-05-03 LAB — GLUCOSE, CAPILLARY
Glucose-Capillary: 78 mg/dL (ref 70–99)
Glucose-Capillary: 73 mg/dL (ref 70–99)
Glucose-Capillary: 48 mg/dL — ABNORMAL LOW (ref 70–99)

## 2020-05-03 LAB — ETHANOL: Alcohol, Ethyl (B): 206 mg/dL — ABNORMAL HIGH (ref <10)

## 2020-05-03 LAB — AMMONIA: Ammonia: 52 umol/L — ABNORMAL HIGH (ref 9–35)

## 2020-05-03 LAB — RAPID URINE DRUG SCREEN, HOSP PERFORMED
Amphetamines: NOT DETECTED
Barbiturates: NOT DETECTED
Benzodiazepines: POSITIVE — AB
Cocaine: NOT DETECTED
Opiates: POSITIVE — AB
Tetrahydrocannabinol: NOT DETECTED

## 2020-05-03 LAB — VALPROIC ACID LEVEL: Valproic Acid Lvl: <10 ug/mL — ABNORMAL LOW (ref 50.0–100.0)

## 2020-05-03 LAB — MAGNESIUM: Magnesium: 1.4 mg/dL — ABNORMAL LOW (ref 1.7–2.4)

## 2020-05-03 LAB — ACETAMINOPHEN LEVEL: Acetaminophen (Tylenol), Serum: <10 ug/mL — ABNORMAL LOW (ref 10–30)

## 2020-05-03 LAB — CBG MONITORING, ED: Glucose-Capillary: 103 mg/dL — ABNORMAL HIGH (ref 70–99)

## 2020-05-03 LAB — SALICYLATE LEVEL: Salicylate Lvl: <7 mg/dL — ABNORMAL LOW (ref 7.0–30.0)

## 2020-05-03 LAB — PHOSPHORUS: Phosphorus: 3.5 mg/dL (ref 2.5–4.6)

## 2020-05-03 LAB — MRSA PCR SCREENING: MRSA by PCR: NEGATIVE

## 2020-05-03 LAB — LIPASE, BLOOD: Lipase: 27 U/L (ref 11–51)

## 2020-05-03 MED ORDER — DEXTROSE IN LACTATED RINGERS 5 % IV SOLN: INTRAVENOUS | Status: DC

## 2020-05-03 MED ORDER — NALOXONE HCL 4 MG/10ML IJ SOLN
1.0000 mg/h | INTRAVENOUS | Status: DC
  Administered 2020-05-03: 1 mg/h via INTRAVENOUS
  Filled 2020-05-03 (×2): qty 10

## 2020-05-03 MED ORDER — VITAL HIGH PROTEIN PO LIQD
1000.0000 mL | ORAL | Status: DC
  Filled 2020-05-03: qty 1000

## 2020-05-03 MED ORDER — POLYETHYLENE GLYCOL 3350 17 G PO PACK: 17.0000 g | PACK | Freq: Every day | ORAL | Status: DC

## 2020-05-03 MED ORDER — FENTANYL CITRATE (PF) 100 MCG/2ML IJ SOLN
50.0000 ug | Freq: Once | INTRAMUSCULAR | Status: AC
  Administered 2020-05-03: 50 ug via INTRAVENOUS
  Filled 2020-05-03: qty 2

## 2020-05-03 MED ORDER — DEXTROSE 50 % IV SOLN
25.0000 g | INTRAVENOUS | Status: AC
  Administered 2020-05-03: 25 g via INTRAVENOUS

## 2020-05-03 MED ORDER — FENTANYL BOLUS VIA INFUSION
50.0000 ug | INTRAVENOUS | Status: DC | PRN
  Filled 2020-05-03: qty 50

## 2020-05-03 MED ORDER — ROCURONIUM BROMIDE 50 MG/5ML IV SOLN
INTRAVENOUS | Status: DC | PRN
  Administered 2020-05-03: 100 mg via INTRAVENOUS

## 2020-05-03 MED ORDER — FENTANYL 2500MCG IN NS 250ML (10MCG/ML) PREMIX INFUSION: 25.0000 ug/h | INTRAVENOUS | Status: DC

## 2020-05-03 MED ORDER — VITAL HIGH PROTEIN PO LIQD: 1000.0000 mL | ORAL | Status: DC

## 2020-05-03 MED ORDER — FENTANYL CITRATE (PF) 100 MCG/2ML IJ SOLN
50.0000 ug | INTRAMUSCULAR | Status: DC | PRN
  Administered 2020-05-03: 50 ug via INTRAVENOUS
  Filled 2020-05-03: qty 2

## 2020-05-03 MED ORDER — CHLORHEXIDINE GLUCONATE CLOTH 2 % EX PADS
6.0000 | MEDICATED_PAD | Freq: Every day | CUTANEOUS | Status: DC
  Administered 2020-05-03: 6 via TOPICAL

## 2020-05-03 MED ORDER — MIDAZOLAM HCL 2 MG/2ML IJ SOLN
2.0000 mg | INTRAMUSCULAR | Status: DC | PRN
  Filled 2020-05-03: qty 2

## 2020-05-03 MED ORDER — FENTANYL CITRATE (PF) 100 MCG/2ML IJ SOLN: 50.0000 ug | Freq: Once | INTRAMUSCULAR | Status: DC

## 2020-05-03 MED ORDER — FAMOTIDINE IN NACL 20-0.9 MG/50ML-% IV SOLN
20.0000 mg | Freq: Two times a day (BID) | INTRAVENOUS | Status: DC
  Administered 2020-05-03: 20 mg via INTRAVENOUS
  Filled 2020-05-03 (×3): qty 50

## 2020-05-03 MED ORDER — NALOXONE HCL 2 MG/2ML IJ SOSY
1.0000 mg | PREFILLED_SYRINGE | Freq: Once | INTRAMUSCULAR | Status: AC
  Administered 2020-05-03: 1 mg via INTRAVENOUS
  Filled 2020-05-03: qty 2

## 2020-05-03 MED ORDER — ROCURONIUM BROMIDE 10 MG/ML (PF) SYRINGE
PREFILLED_SYRINGE | INTRAVENOUS | Status: AC
  Filled 2020-05-03: qty 10

## 2020-05-03 MED ORDER — ROCURONIUM BROMIDE 50 MG/5ML IV SOLN: 100.0000 mg | Freq: Once | INTRAVENOUS | Status: DC

## 2020-05-03 MED ORDER — FENTANYL CITRATE (PF) 100 MCG/2ML IJ SOLN
50.0000 ug | INTRAMUSCULAR | Status: DC | PRN
  Administered 2020-05-04: 100 ug via INTRAVENOUS
  Administered 2020-05-04: 200 ug via INTRAVENOUS
  Filled 2020-05-03: qty 4
  Filled 2020-05-03: qty 2

## 2020-05-03 MED ORDER — PROSOURCE TF PO LIQD
45.0000 mL | Freq: Two times a day (BID) | ORAL | Status: DC
  Filled 2020-05-03 (×2): qty 45

## 2020-05-03 MED ORDER — SUCCINYLCHOLINE CHLORIDE 200 MG/10ML IV SOSY
PREFILLED_SYRINGE | INTRAVENOUS | Status: AC
  Filled 2020-05-03: qty 10

## 2020-05-03 MED ORDER — PROSOURCE TF PO LIQD
90.0000 mL | Freq: Three times a day (TID) | ORAL | Status: DC
  Administered 2020-05-03 (×2): 90 mL
  Filled 2020-05-03 (×2): qty 90

## 2020-05-03 MED ORDER — FENTANYL CITRATE (PF) 100 MCG/2ML IJ SOLN: 100.0000 ug | Freq: Once | INTRAMUSCULAR | Status: DC

## 2020-05-03 MED ORDER — ETOMIDATE 2 MG/ML IV SOLN
INTRAVENOUS | Status: AC
  Filled 2020-05-03: qty 20

## 2020-05-03 MED ORDER — HEPARIN SODIUM (PORCINE) 5000 UNIT/ML IJ SOLN
5000.0000 [IU] | Freq: Three times a day (TID) | INTRAMUSCULAR | Status: DC
  Administered 2020-05-03 – 2020-05-08 (×16): 5000 [IU] via SUBCUTANEOUS
  Filled 2020-05-03 (×18): qty 1

## 2020-05-03 MED ORDER — MIDAZOLAM HCL 2 MG/2ML IJ SOLN: 2.0000 mg | INTRAMUSCULAR | Status: DC | PRN

## 2020-05-03 MED ORDER — PANTOPRAZOLE SODIUM 40 MG IV SOLR
40.0000 mg | Freq: Every day | INTRAVENOUS | Status: DC
  Administered 2020-05-03 – 2020-05-04 (×2): 40 mg via INTRAVENOUS
  Filled 2020-05-03 (×2): qty 40

## 2020-05-03 MED ORDER — CHLORHEXIDINE GLUCONATE 0.12% ORAL RINSE (MEDLINE KIT)
15.0000 mL | Freq: Two times a day (BID) | OROMUCOSAL | Status: DC
  Administered 2020-05-04: 15 mL via OROMUCOSAL

## 2020-05-03 MED ORDER — POLYETHYLENE GLYCOL 3350 17 G PO PACK: 17.0000 g | PACK | Freq: Every day | ORAL | Status: DC | PRN

## 2020-05-03 MED ORDER — MIDAZOLAM HCL 2 MG/2ML IJ SOLN
2.0000 mg | INTRAMUSCULAR | Status: DC | PRN
  Administered 2020-05-03 – 2020-05-04 (×2): 2 mg via INTRAVENOUS
  Filled 2020-05-03: qty 2

## 2020-05-03 MED ORDER — MIDAZOLAM HCL 2 MG/2ML IJ SOLN
2.0000 mg | INTRAMUSCULAR | Status: DC | PRN
Start: 2020-05-03 — End: 2020-05-03
  Administered 2020-05-03: 2 mg via INTRAVENOUS

## 2020-05-03 MED ORDER — ORAL CARE MOUTH RINSE
15.0000 mL | OROMUCOSAL | Status: DC
  Administered 2020-05-04 (×4): 15 mL via OROMUCOSAL

## 2020-05-03 MED ORDER — LACTATED RINGERS IV SOLN: INTRAVENOUS | Status: DC

## 2020-05-03 MED ORDER — DEXTROSE IN LACTATED RINGERS 5 % IV SOLN: INTRAVENOUS | Status: AC

## 2020-05-03 MED ORDER — DOCUSATE SODIUM 50 MG/5ML PO LIQD
100.0000 mg | Freq: Two times a day (BID) | ORAL | Status: DC
  Administered 2020-05-03: 100 mg
  Filled 2020-05-03 (×3): qty 10

## 2020-05-03 NOTE — ED Notes (Signed)
Pt placed on bear hugger.

## 2020-05-03 NOTE — Progress Notes (Signed)
RT note. ETT advanced from 21cm to 24cm per Dr. [name]. RT will continue to monitor

## 2020-05-03 NOTE — ED Triage Notes (Signed)
Pt arrived via ems due to an overdose. Ems is unsure of what pt took however all medications in pts home were empty. Pt was on facebook live reporting he wanted to kill himself. Police was initially on scene and residents reported the pt was not home when he was.EMS gave 2 of narcan, and 350 of ns. Ems reports pts bp was 88/50,cbg 118,sinus tach 120. Pt is on 15 of nrb.

## 2020-05-03 NOTE — Progress Notes (Signed)
Poison control updated on patients status.

## 2020-05-03 NOTE — ED Provider Notes (Signed)
  INTUBATION Performed by: Larene Pickett  Required items: required blood products, implants, devices, and special equipment available Patient identity confirmed: provided demographic data and hospital-assigned identification number Time out: Immediately prior to procedure a "time out" was called to verify the correct patient, procedure, equipment, support staff and site/side marked as required.  Indications: airway protection, drug OD  Intubation method: Glidescope Laryngoscopy   Preoxygenation: BVM  Sedatives: none Paralytic: rocuronium  Tube Size: 7.5 cuffed  Post-procedure assessment: chest rise and ETCO2 monitor Breath sounds: equal and absent over the epigastrium Tube secured with: ETT holder Chest x-ray interpreted by radiologist and me.  Chest x-ray findings: endotracheal tube somewhat high, will advance a few cm  Patient tolerated the procedure well with no immediate complications.      Larene Pickett, PA-C 05/03/20 0344    Margette Fast, MD 05/03/20 [id number]

## 2020-05-03 NOTE — ED Notes (Signed)
Poison control contacted

## 2020-05-03 NOTE — H&P (Addendum)
NAME:  Juan Hester, MRN:  [id number], DOB:  11-01-1980, LOS: 0 ADMISSION DATE:  05/03/2020, CONSULTATION DATE:  05/04/2019 REFERRING MD:  Dr. [name], CHIEF COMPLAINT:  Overdose    History of Present Illness:  40 year old male presents to ED on 1/19 after reported overdose. EMS reports that patient was on facebook live reporting he was going to kill himself. When EMS arrived to scene patient pill bottles including Seroquel, Flexeril, Oxycodone, Klonopin, Cymbalta were all empty, and patient was minimally responsive. Given 2 mg Narcan with little effect. On arrival to ED remained unresponsive after multiple doses of narcan and a narcan infusion requiring intubation. ETOH level 206. Dr. [name]  (EDP) completed IVC paperwork. CT head negative. Critical Care Consulted for admission.   Past Medical History:  Chronic Foot Pain, Chron's Disease, H/O PE now off Xarelto, Bipolar Disorder   Significant Hospital Events:  1/19 > Presents to ED   Consults:  PCCM  Procedures:  ETT 1/19 >>  Significant Diagnostic Tests:  CT Head 1/19 > negative CXR 1/19 > The heart size and mediastinal contours are within normal limits. A left-sided MediPort catheter seen the tip at superior cavoatrial junction. There appears to be a shallow degree of aeration with bibasilar subsegmental atelectasis. For a small left pleural effusion is seen the visualized skeletal structures are unremarkable.  Micro Data:    Antimicrobials:     Interim History / Subjective:  As above.   Objective   Blood pressure (!) 127/96, pulse 95, resp. rate 20, height 6' 1"  (1.854 m), SpO2 100 %.    Vent Mode: PRVC FiO2 (%):  [100 %] 100 % Set Rate:  [15 bmp-20 bmp] 20 bmp Vt Set:  [630 mL] 630 mL PEEP:  [5 cmH20-8 cmH20] 8 cmH20  No intake or output data in the 24 hours ending 05/03/20 0554 There were no vitals filed for this visit.  Examination: General: adult male on vent  HENT: ETT/OG in place  Lungs: vent assisted breaths   Cardiovascular: RRR, Rate 95 Abdomen: soft, non-distended, ostomy bag noted   Extremities: -edema  Neuro: sedated, pupils intact and reactive, does not follow commands, +gag/cough  GU: intact   Resolved Hospital Problem list     Assessment & Plan:   Encephalopathy in setting of acute overdose and ETOH intoxication  Plan -Hold Sedative mediations -Given OD on Seroquel will monitor QTC, Currently 497   -Once extubation will need Psych consult  Respiratory Insuffiencey with hypercarbia secondary to above requiring intubation  Plan -Vent Support >> plan for extubation once neuro status improves  -Trend ABG/CXR -VAP prevention bundle   Chronic Pain  Bipolar Disorder,Schizoaffective Disorder   Plan -Hold home Cymbalta, Depakote, Seroquel, klonopin given acute overdose on this drugs   Crohn's Disease  Plan -Supportive Care -Lomotil PRN    Best practice (evaluated daily)  Diet: NPO Pain/Anxiety/Delirium protocol (if indicated) VAP protocol (if indicated) DVT prophylaxis: Heparin sq GI prophylaxis: PPI Glucose control: SSI Mobility: Bedrest Disposition:Admit to ICU   Goals of Care:  Last date of multidisciplinary goals of care discussion: Family and staff present:  Summary of discussion:  Follow up goals of care discussion due:  Code Status: FC  Labs   CBC: Recent Labs  Lab 05/03/20 0230 05/03/20 0425  WBC 6.8  --   NEUTROABS 5.4  --   HGB 13.7 12.6*  HCT 38.1* 37.0*  MCV 98.2  --   PLT 129*  --     Basic Metabolic Panel: Recent  Labs  Lab 05/03/20 0230 05/03/20 0425  NA 138 141  K 3.6 3.2*  CL 106  --   CO2 23  --   GLUCOSE 111*  --   BUN 9  --   CREATININE 1.29*  --   CALCIUM 8.5*  --    GFR: Estimated Creatinine Clearance: 100.7 mL/min (A) (by C-G formula based on SCr of 1.29 mg/dL (H)). Recent Labs  Lab 05/03/20 0230  WBC 6.8    Liver Function Tests: Recent Labs  Lab 05/03/20 0230  AST 55*  ALT 66*  ALKPHOS 97  BILITOT 0.7   PROT 6.3*  ALBUMIN 3.2*   Recent Labs  Lab 05/03/20 0230  LIPASE 27   Recent Labs  Lab 05/03/20 0336  AMMONIA 52*    ABG    Component Value Date/Time   PHART 7.263 (L) 05/03/2020 0425   PCO2ART 54.0 (H) 05/03/2020 0425   PO2ART 54 (L) 05/03/2020 0425   HCO3 24.4 05/03/2020 0425   TCO2 26 05/03/2020 0425   ACIDBASEDEF 3.0 (H) 05/03/2020 0425   O2SAT 82.0 05/03/2020 0425     Coagulation Profile: No results for input(s): INR, PROTIME in the last 168 hours.  Cardiac Enzymes: No results for input(s): CKTOTAL, CKMB, CKMBINDEX, TROPONINI in the last 168 hours.  HbA1C: Hgb A1c MFr Bld  Date/Time Value Ref Range Status  05/05/2019 11:11 AM 5.0 4.8 - 5.6 % Final    Comment:             Prediabetes: 5.7 - 6.4          Diabetes: >6.4          Glycemic control for adults with diabetes: <7.0     CBG: Recent Labs  Lab 05/03/20 0217  GLUCAP 103*    Review of Systems:   Unable to review given encephalopathy   Past Medical History:  He,  has a past medical history of Acute renal failure ([place]) (12/16/2019), Anxiety, Colon cancer ([place]), Crohn disease ([place]), Depression, ETOH abuse (10/16/2019), Foot drop, right, Multiple subsegmental pulmonary emboli without acute cor pulmonale ([place]) (07/21/2019), Myocardial infarction ([place]), PE (pulmonary thromboembolism) ([place]), PTSD (post-traumatic stress disorder), and Thyroid disease.   Surgical History:   Past Surgical History:  Procedure Laterality Date  . ABDOMINAL SURGERY    . COLON SURGERY    . IR RADIOLOGIST EVAL & MGMT  08/31/2019  . OSTOMY    . SMALL INTESTINE SURGERY       Social History:   reports that he has been smoking. He has been smoking about 1.00 pack per day. He has never used smokeless tobacco. He reports current alcohol use. He reports previous drug use.   Family History:  His family history is not on file.   Allergies Allergies  Allergen Reactions  . Azathioprine Other (See Comments)    Pt does not recall  reaction    . Ciprofloxacin Other (See Comments)    Pt does not recall reaction    . Mesalamine Other (See Comments)    Pt does not recall reaction  . Metronidazole Other (See Comments)    Pt does not recall reaction   . Sulfa Antibiotics Other (See Comments)    Pt does not recall reaction   . Barium Sulfate Rash  . Barium-Containing Compounds Rash     Home Medications  Prior to Admission medications   Medication Sig Start Date End Date Taking? Authorizing Provider  acetaminophen (TYLENOL) 325 MG tablet Take 2 tablets (650 mg total)  by mouth every 6 (six) hours as needed for mild pain (or Fever >/= 101). 12/20/19   Domenic Polite, MD  calamine lotion Apply 1 application topically as needed for itching. On top of right foot 04/24/20   [name], MD  clonazePAM (KLONOPIN) 0.25 MG disintegrating tablet Take 1 tablet (0.25 mg total) by mouth 2 (two) times daily. Patient taking differently: Take 0.25 mg by mouth 2 (two) times daily as needed. 04/19/20   Salley Slaughter, NP  cyclobenzaprine (FLEXERIL) 10 MG tablet Take 1 tablet (10 mg total) by mouth 2 (two) times daily as needed for muscle spasms. 04/24/20   [name], MD  diphenoxylate-atropine (LOMOTIL) 2.5-0.025 MG tablet Take 1 tablet by mouth 4 (four) times daily. 01/31/20   [name], MD  divalproex (DEPAKOTE) 500 MG DR tablet Take 1 tablet (500 mg total) by mouth 2 (two) times daily. 04/19/20   Salley Slaughter, NP  DULoxetine (CYMBALTA) 60 MG capsule Take 1 capsule (60 mg total) by mouth daily. 04/19/20   Salley Slaughter, NP  gabapentin (NEURONTIN) 300 MG capsule Take 2 capsules (600 mg total) by mouth 3 (three) times daily. 01/29/20   [name], MD  hydrOXYzine (ATARAX/VISTARIL) 10 MG tablet Take 1 tablet (10 mg total) by mouth 3 (three) times daily as needed. 04/24/20   [name], MD  lidocaine (LIDODERM) 5 % Place 1 patch onto the skin daily. Remove & Discard patch within 12 hours or as  directed by MD 01/20/20   Lamptey, Myrene Galas, MD  naloxone [facility]) nasal spray 4 mg/0.1 mL Place 0.1 sprays (0.4 mg total) into the nose See admin instructions. Use as needed for overdose symptoms from opiate 03/15/20   [name], MD  oxyCODONE (ROXICODONE) 15 MG immediate release tablet Take 1 tablet (15 mg total) by mouth every 6 (six) hours as needed for up to 10 days. 04/29/20 05/09/20  [name], MD  potassium chloride (KLOR-CON) 10 MEQ tablet Take 1 tablet (10 mEq total) by mouth daily for 5 days. 02/07/20 02/12/20  Marcello Fennel, PA-C  QUEtiapine (SEROQUEL) 400 MG tablet Take 1 tablet (400 mg total) by mouth at bedtime. 04/19/20   Salley Slaughter, NP  QUEtiapine (SEROQUEL) 50 MG tablet Take 1 tablet (50 mg total) by mouth daily. 04/19/20   Salley Slaughter, NP  simethicone (MYLICON) 80 MG chewable tablet Chew 80 mg by mouth every 6 (six) hours as needed for flatulence.    [provider]     Critical care time: 42 minutes     Hayden Pedro, AGACNP-BC [facility]  PCCM Pgr: [phone]

## 2020-05-03 NOTE — ED Notes (Addendum)
NG tube inserted by [facility]

## 2020-05-03 NOTE — Progress Notes (Signed)
RT note. Pt. Transported to CT and back to Rm 30 in ED without any complications. Pt. Movement in legs and arms noted. RT will continue to monitor.

## 2020-05-03 NOTE — ED Notes (Signed)
Sitter at bedside.

## 2020-05-03 NOTE — ED Provider Notes (Signed)
Emergency Department Provider Note   I have reviewed the triage vital signs and the nursing notes.   HISTORY  Chief Complaint Drug Overdose   HPI Juan Hester is a 40 y.o. male presents to the emergency department for evaluation after an intentional overdose at home.  Patient apparently took multiple pills including Flexeril, Seroquel, oxycodone, Klonopin along with alcohol in an apparent suicide attempt.  According to police the patient was posting on Facebook that he was doing this and exactly how much he was taking of different medications.  He apparently activated a Facebook live stream which is how police got involved when contacted by those concerned.  EMS arrived to find the patient minimally responsive.  They gave 2 mg of Narcan with little effect and transported to the ED on nonrebreather. No known trauma. Normal CBG en route.   Level 5 caveat: Overdose - AMS  Past Medical History:  Diagnosis Date  . Acute renal failure ([place]) 12/16/2019  . Anxiety    per pt report  . Colon cancer ([place])   . Crohn disease ([place])   . Depression    per pt report  . ETOH abuse 10/16/2019  . Foot drop, right   . Multiple subsegmental pulmonary emboli without acute cor pulmonale ([place]) 07/21/2019  . Myocardial infarction ([place])   . PE (pulmonary thromboembolism) ([place])   . PTSD (post-traumatic stress disorder)   . Thyroid disease     Patient Active Problem List   Diagnosis Date Noted  . Overdose 05/03/2020  . Chronic pain syndrome 01/31/2020  . Homelessness 12/27/2019  . Colostomy status ([place]) 12/16/2019  . History of pulmonary embolism 12/16/2019  . Right foot drop 12/15/2019  . GAD (generalized anxiety disorder) 10/20/2019  . MDD (major depressive disorder) 10/20/2019  . PTSD (post-traumatic stress disorder) 10/20/2019  . Bipolar affective disorder, currently depressed, mild ([place]) 10/16/2019  . Protrusion of lumbar intervertebral disc 10/05/2019  . Bipolar 1 disorder ([place]) 07/21/2019   . History of colon cancer 07/21/2019  . Schizoaffective disorder, bipolar type ([place]) 06/03/2019  . Nicotine dependence, cigarettes, uncomplicated 35/36/1443  . Crohn's disease with complication ([place]) 15/40/0867    Past Surgical History:  Procedure Laterality Date  . ABDOMINAL SURGERY    . COLON SURGERY    . IR RADIOLOGIST EVAL & MGMT  08/31/2019  . OSTOMY    . SMALL INTESTINE SURGERY      Allergies Azathioprine, Ciprofloxacin, Mesalamine, Metronidazole, Sulfa antibiotics, Barium sulfate, and Barium-containing compounds  No family history on file.  Social History Social History   Tobacco Use  . Smoking status: Current Every Day Smoker    Packs/day: 1.00  . Smokeless tobacco: Never Used  Vaping Use  . Vaping Use: Never used  Substance Use Topics  . Alcohol use: Yes  . Drug use: Not Currently    Review of Systems  Level 5 caveat: AMS  ____________________________________________   PHYSICAL EXAM:  VITAL SIGNS: ED Triage Vitals [05/03/20 0213]  Enc Vitals Group     BP 110/71     Pulse Rate (!) 105     Resp (!) 23     Temp      Temp src      SpO2 100 %   Constitutional: Obtunded. Not responding to verbal or noxious stim. Absent gag reflex.  Eyes: Conjunctivae are normal. Pupils are 1 mm and non-reactive bilaterally.  Head: Atraumatic. Nose: No congestion/rhinnorhea. Mouth/Throat: Mucous membranes are dry.  Neck: No stridor.   Cardiovascular: Tachycardia. Good peripheral  circulation. Grossly normal heart sounds.   Respiratory: Normal respiratory effort.  No retractions. Lungs CTAB. Gastrointestinal: No distention. Left lower quadrant colostomy with output in bag.  Musculoskeletal: No gross deformities in the upper or lower extremities.  Neurologic: Unresponsive. Absent gag reflex.  Skin:  Skin is warm, dry and intact. No rash noted.  ____________________________________________   LABS (all labs ordered are listed, but only abnormal results are  displayed)  Labs Reviewed  ACETAMINOPHEN LEVEL - Abnormal; Notable for the following components:      Result Value   Acetaminophen (Tylenol), Serum <10 (*)    All other components within normal limits  COMPREHENSIVE METABOLIC PANEL - Abnormal; Notable for the following components:   Glucose, Bld 111 (*)    Creatinine, Ser 1.29 (*)    Calcium 8.5 (*)    Total Protein 6.3 (*)    Albumin 3.2 (*)    AST 55 (*)    ALT 66 (*)    All other components within normal limits  ETHANOL - Abnormal; Notable for the following components:   Alcohol, Ethyl (B) 206 (*)    All other components within normal limits  SALICYLATE LEVEL - Abnormal; Notable for the following components:   Salicylate Lvl <6.2 (*)    All other components within normal limits  CBC WITH DIFFERENTIAL/PLATELET - Abnormal; Notable for the following components:   RBC 3.88 (*)    HCT 38.1 (*)    MCH 35.3 (*)    Platelets 129 (*)    All other components within normal limits  RAPID URINE DRUG SCREEN, HOSP PERFORMED - Abnormal; Notable for the following components:   Opiates POSITIVE (*)    Benzodiazepines POSITIVE (*)    All other components within normal limits  AMMONIA - Abnormal; Notable for the following components:   Ammonia 52 (*)    All other components within normal limits  VALPROIC ACID LEVEL - Abnormal; Notable for the following components:   Valproic Acid Lvl <10 (*)    All other components within normal limits  CBG MONITORING, ED - Abnormal; Notable for the following components:   Glucose-Capillary 103 (*)    All other components within normal limits  I-STAT ARTERIAL BLOOD GAS, ED - Abnormal; Notable for the following components:   pH, Arterial 7.263 (*)    pCO2 arterial 54.0 (*)    pO2, Arterial 54 (*)    Acid-base deficit 3.0 (*)    Potassium 3.2 (*)    HCT 37.0 (*)    Hemoglobin 12.6 (*)    All other components within normal limits  RESP PANEL BY RT-PCR (FLU A&B, COVID) ARPGX2  LIPASE, BLOOD  BLOOD GAS,  ARTERIAL   ____________________________________________  EKG   EKG Interpretation  Date/Time:  Wednesday May 03 2020 02:11:24 EST Ventricular Rate:  106 PR Interval:    QRS Duration: 96 QT Interval:  370 QTC Calculation: 492 R Axis:   -168 Text Interpretation: Right and left arm electrode reversal, interpretation assumes no reversal Sinus tachycardia Probable lateral infarct, age indeterminate no stemi Confirmed by Nanda Quinton [phone]) on 05/03/2020 5:53:24 AM       ____________________________________________  RADIOLOGY  CT Head Wo Contrast  Result Date: 05/03/2020 CLINICAL DATA:  Mental status change with unknown cause. History of ethanol abuse and colon cancer. EXAM: CT HEAD WITHOUT CONTRAST TECHNIQUE: Contiguous axial images were obtained from the base of the skull through the vertex without intravenous contrast. COMPARISON:  07/20/2019 FINDINGS: Brain: No evidence of acute infarction, hemorrhage, hydrocephalus,  extra-axial collection or mass lesion/mass effect. Vascular: No hyperdense vessel or unexpected calcification. Skull: Normal. Negative for fracture or focal lesion. Sinuses/Orbits: No acute finding. Nasopharyngeal fluid in the setting of intubation. IMPRESSION: Negative head CT. Electronically Signed   By: Monte Fantasia M.D.   On: 05/03/2020 05:59   DG Chest Port 1 View  Result Date: 05/03/2020 CLINICAL DATA:  ET/OG placement EXAM: PORTABLE CHEST 1 VIEW COMPARISON:  Radiograph 02/07/2020, 05/03/2020, CT 02/07/2020 FINDINGS: *High positioning of the endotracheal tube approximately 10.7 cm from the carina. Recommend advancing 6 cm to the mid trachea. *Transesophageal tube tip and side port distal to the GE junction, terminating below the margin of imaging. *Implantable left subclavian approach Port-A-Cath tip terminates at the superior cavoatrial junction. Persistently low lung volumes with streaky basilar opacities favored to reflect atelectatic change with vascular  crowding. No focal consolidative opacity is seen. Trace bilateral effusions. No pneumothorax. No acute osseous or soft tissue abnormality. IMPRESSION: 1. High positioning of the endotracheal tube approximately 10.7 cm from the carina. Recommend advancing 6 cm to the mid trachea. 2. Transesophageal tube tip and side port distal to the GE junction, terminating below the margin of imaging. 3. Persistently low volumes with likely atelectasis and trace effusions. Electronically Signed   By: Lovena Le M.D.   On: 05/03/2020 03:24   DG Chest Portable 1 View  Result Date: 05/03/2020 CLINICAL DATA:  Overdose EXAM: PORTABLE CHEST 1 VIEW February 07, 2020 FINDINGS: The heart size and mediastinal contours are within normal limits. A left-sided MediPort catheter seen the tip at superior cavoatrial junction. There appears to be a shallow degree of aeration with bibasilar subsegmental atelectasis. For a small left pleural effusion is seen the visualized skeletal structures are unremarkable. IMPRESSION: Small left pleural effusion. Electronically Signed   By: Prudencio Pair M.D.   On: 05/03/2020 02:42    ____________________________________________   PROCEDURES  Procedure(s) performed:   .Critical Care Performed by: Margette Fast, MD Authorized by: Margette Fast, MD   Critical care provider statement:    Critical care time (minutes):  45   Critical care time was exclusive of:  Separately billable procedures and treating other patients and teaching time   Critical care was necessary to treat or prevent imminent or life-threatening deterioration of the following conditions:  Respiratory failure and toxidrome   Critical care was time spent personally by me on the following activities:  Discussions with consultants, evaluation of patient's response to treatment, examination of patient, ordering and performing treatments and interventions, ordering and review of laboratory studies, ordering and review of  radiographic studies, pulse oximetry, re-evaluation of patient's condition, obtaining history from patient or surrogate, review of old charts, blood draw for specimens, development of treatment plan with patient or surrogate and ventilator management   I assumed direction of critical care for this patient from another provider in my specialty: no     Care discussed with: admitting provider       ____________________________________________   INITIAL IMPRESSION / [place] / ED COURSE  Pertinent labs & imaging results that were available during my care of the patient were reviewed by me and considered in my medical decision making (see chart for details).   Patient arrives obtunded with an apparent intentional overdose. Multiple meds taken including Klonopin, Oxycodone, Flexeril, and Seroquel along with EtOH. Will try additional narcan here but get ready for intubation if not improving rapidly.   02:22 PM  Went back to the room to  evaluate the patient after placing orders.  Patient is purposeful with painful stimulus.  He is able to cough and now has a gag reflex.  He appears to have responded at least temporarily to the Narcan push.  Plan for Narcan drip and will hold on intubation at this time as his exam has improved.   03:05 PM  Called back to patient bedside with his mental status again deteriorating.  He is no longer localizing to pain and does not have a gag reflex.  Additional Narcan given but not responding well.  This is not completely surprising given the amount of other sedating medicines and alcohol that he took along with the oxycodone.  Patient intubated under my direct supervision without complication.  See attached intubation note.   IVC paperwork completed and filed.   Discussed patient's case with ICU to request admission. Patient and family (if present) updated with plan. Care transferred to ICU service.  I reviewed all nursing notes, vitals, pertinent old  records, EKGs, labs, imaging (as available).  ____________________________________________  FINAL CLINICAL IMPRESSION(S) / ED DIAGNOSES  Final diagnoses:  Intentional drug overdose, initial encounter ([place])     MEDICATIONS GIVEN DURING THIS VISIT:  Medications  etomidate (AMIDATE) 2 MG/ML injection (has no administration in time range)  rocuronium (ZEMURON) injection (100 mg Intravenous Given 05/03/20 0302)  polyethylene glycol (MIRALAX / GLYCOLAX) packet 17 g (has no administration in time range)  heparin injection 5,000 Units (5,000 Units Subcutaneous Given 05/03/20 0620)  pantoprazole (PROTONIX) injection 40 mg (has no administration in time range)  lactated ringers infusion ( Intravenous New Bag/Given 05/03/20 0626)  naloxone [facility]) injection 1 mg (1 mg Intravenous Given 05/03/20 0215)  fentaNYL (SUBLIMAZE) injection 50 mcg (50 mcg Intravenous Given 05/03/20 0529)    Note:  This document was prepared using Dragon voice recognition software and may include unintentional dictation errors.  Nanda Quinton, MD, [facility] Emergency Medicine    Roselynne Lortz, [facility], MD 05/03/20 [phone]

## 2020-05-03 NOTE — ED Notes (Signed)
Spoke to poison control. Updated them on lab values and latest QT/QTc 375/489. Instructed by poison control to continue allowing pt to metabolize medications ingested.

## 2020-05-03 NOTE — Progress Notes (Signed)
eLink Physician-Brief Progress Note Patient Name: Juan Hester DOB: [birth year] MRN: [id number]   Date of Service  05/03/2020  HPI/Events of Note  Hypoglycemia.  eICU Interventions  Maintenance iv fluids changed from LR to D 5 % LR @ 75  Ml / hour.        Kerry Kass [place] 05/03/2020, 11:51 PM

## 2020-05-03 NOTE — ED Notes (Signed)
Bear hugger removed with temp of 98.7

## 2020-05-03 NOTE — Progress Notes (Signed)
Initial Nutrition Assessment  RD working remotely.  DOCUMENTATION CODES:   Obesity unspecified  INTERVENTION:   Initiate tube feeds via NG tube: - Vital High Protein @ 55 ml/hr (1320 ml/day) - ProSource TF 90 ml TID  Tube feeding regimen provides 1560 kcal, 182 grams of protein, and 1104 ml of H2O.   NUTRITION DIAGNOSIS:   Inadequate oral intake related to inability to eat as evidenced by NPO status.  GOAL:   Provide needs based on ASPEN/SCCM guidelines  MONITOR:   Vent status,Labs,Weight trends,TF tolerance  REASON FOR ASSESSMENT:   Ventilator,Consult Enteral/tube feeding initiation and management  ASSESSMENT:   40 year old male who presented to the ED on 1/19 after an intentional overdose/suicide attempt including multiple medications and alcohol. Pt intubated in the ED. PMH of bipolar disorder, PTSD, depression, anxiety, chronic pain disorder, EtOH abuse, Crohn's disorder.   RD consulted for tube feeding initiation and management. NG tube in place with tip terminating below the margin of imaging per chest x-ray.  Unable to obtain diet and weight history from pt at this time. Reviewed available weight history in chart. Pt with a weight loss of 24.5 kg from 10/15/19 to 04/24/20. This is an 18% weight loss in less than 7 months which is sever and significant for timeframe. Unable to determine whether weight loss was intentional vs unintentional.  Patient is currently intubated on ventilator support MV: 13.7 L/min Temp (24hrs), Avg:98.1 F (36.7 C), Min:96.1 F (35.6 C), Max:99.5 F (37.5 C) BP (cuff): 117/79 MAP (cuff): 91  Drips: LR: 75 ml/hr  Medications reviewed and include: colace, IV protonix, miralax, IV pepcid  Labs reviewed: elevated LFTs CBG's: 103  NUTRITION - FOCUSED PHYSICAL EXAM:  Unable to complete at this time. RD working remotely.  Diet Order:   Diet Order            Diet NPO time specified  Diet effective now                  EDUCATION NEEDS:   No education needs have been identified at this time  Skin:  Skin Assessment: Reviewed RN Assessment  Last BM:  no documented BM  Height:   Ht Readings from Last 1 Encounters:  05/03/20 6' 1"  (1.854 m)    Weight:   Wt Readings from Last 1 Encounters:  04/24/20 111.6 kg    Ideal Body Weight:  83.6 kg  BMI:  Body mass index is 32.46 kg/m.  Estimated Nutritional Needs:   Kcal:  1250-1600  Protein:  170-185 grams  Fluid:  1.8 L/day    Gustavus Bryant, MS, RD, LDN Inpatient Clinical Dietitian Please see AMiON for contact information.

## 2020-05-03 NOTE — Progress Notes (Signed)
[facility] P[facility]M  Seen and examined this morning Admitted overnight for intentional overdose, suicide attempt On narcan infusion, failed, required intubation Seroquel, flexeril, oxycodone, klonopin, cymbalta IVC paperwork completed  S He is resting comfortably on the vent now Bedside team reports no pursposeful movements  O Vitals:   05/03/20 0734 05/03/20 0830 05/03/20 0900 05/03/20 0945  BP: 106/80 108/81 113/80 116/83  Pulse: 95 98 99 98  Resp: 20 20 20 20   Temp:   (!) 96.1 F (35.6 C) (!) 96.2 F (35.7 C)  TempSrc:   Core   SpO2: 100% 100% 100% 100%  Height:       Vent Mode: PRVC FiO2 (%):  [100 %] 100 % Set Rate:  [15 bmp-20 bmp] 20 bmp Vt Set:  [630 mL] 630 mL PEEP:  [5 cmH20-8 cmH20] 8 cmH20  General:  In bed on vent HENT: NCAT ETT in place PULM: CTA B, vent supported breathing CV: RRR, no mgr GI: BS+, soft, nontender MSK: normal bulk and tone Neuro: no response to tactile or verbal stimuli on my exam  CBC    Component Value Date/Time   WBC 6.8 05/03/2020 0230   RBC 3.88 (L) 05/03/2020 0230   HGB 12.6 (L) 05/03/2020 0425   HGB 14.9 12/15/2019 1133   HCT 37.0 (L) 05/03/2020 0425   HCT 42.4 12/15/2019 1133   PLT 129 (L) 05/03/2020 0230   PLT 178 12/15/2019 1133   MCV 98.2 05/03/2020 0230   MCV 89 12/15/2019 1133   MCH 35.3 (H) 05/03/2020 0230   MCHC 36.0 05/03/2020 0230   RDW 13.9 05/03/2020 0230   RDW 14.4 12/15/2019 1133   LYMPHSABS 0.7 05/03/2020 0230   LYMPHSABS 0.8 12/15/2019 1133   MONOABS 0.6 05/03/2020 0230   EOSABS 0.1 05/03/2020 0230   EOSABS 0.2 12/15/2019 1133   BASOSABS 0.0 05/03/2020 0230   BASOSABS 0.1 12/15/2019 1133   BMET    Component Value Date/Time   NA 141 05/03/2020 0425   NA 132 (L) 12/15/2019 1133   K 3.2 (L) 05/03/2020 0425   CL 106 05/03/2020 0230   CO2 23 05/03/2020 0230   GLUCOSE 111 (H) 05/03/2020 0230   BUN 9 05/03/2020 0230   BUN 36 (H) 12/15/2019 1133   CREATININE 1.29 (H) 05/03/2020 0230   CALCIUM 8.5 (L)  05/03/2020 0230   GFRNONAA >60 05/03/2020 0230   GFRAA >60 12/22/2019 1515   UDS: pos opiates, benzo  CXR: ETT 10.7 cm above carina, lungs clear, images personally reviewed  Ammonia elevated, valproate level undected  Impression: Acute respiratory failure with hypoxemia due to inability to protect airway due to drug overdose Intentional drug overdose Acute metabolic encephalopathy due to drug overdose Hypokalemia AKI History of PTSD History of PE History of EtOH abuse Depression Anxiety  Plan: Admit to ICU Full vent support Will add PAD protocol for minimal sedation in case of vent dyssynchrony, RASS goal 0, however plan is to use no sedation as long as he is encephalopathic Hold home medications Start tube feeding Add AM labs Extubate when awake Add LR infusion Suicide sitter Psyche evaluation after extubation  My cc time 33 minutes  Roselie Awkward, MD [place] P[facility]M Pager: [phone] Cell: [phone] If no response, call [phone]

## 2020-05-04 DIAGNOSIS — J988 Other specified respiratory disorders: Secondary | ICD-10-CM

## 2020-05-04 DIAGNOSIS — T50902A Poisoning by unspecified drugs, medicaments and biological substances, intentional self-harm, initial encounter: Secondary | ICD-10-CM

## 2020-05-04 LAB — BASIC METABOLIC PANEL
Anion gap: 11 (ref 5–15)
BUN: 10 mg/dL (ref 6–20)
CO2: 23 mmol/L (ref 22–32)
Calcium: 8.4 mg/dL — ABNORMAL LOW (ref 8.9–10.3)
Chloride: 105 mmol/L (ref 98–111)
Creatinine, Ser: 1.04 mg/dL (ref 0.61–1.24)
GFR, Estimated: >60 mL/min (ref >60)
Glucose, Bld: 105 mg/dL — ABNORMAL HIGH (ref 70–99)
Potassium: 3.6 mmol/L (ref 3.5–5.1)
Sodium: 139 mmol/L (ref 135–145)

## 2020-05-04 LAB — CBC
HCT: 37.3 % — ABNORMAL LOW (ref 39.0–52.0)
Hemoglobin: 12.6 g/dL — ABNORMAL LOW (ref 13.0–17.0)
MCH: 33.9 pg (ref 26.0–34.0)
MCHC: 33.8 g/dL (ref 30.0–36.0)
MCV: 100.3 fL — ABNORMAL HIGH (ref 80.0–100.0)
Platelets: 106 10*3/uL — ABNORMAL LOW (ref 150–400)
RBC: 3.72 MIL/uL — ABNORMAL LOW (ref 4.22–5.81)
RDW: 13.8 % (ref 11.5–15.5)
WBC: 8.3 10*3/uL (ref 4.0–10.5)
nRBC: 0 % (ref 0.0–0.2)

## 2020-05-04 LAB — GLUCOSE, CAPILLARY
Glucose-Capillary: 105 mg/dL — ABNORMAL HIGH (ref 70–99)
Glucose-Capillary: 70 mg/dL (ref 70–99)
Glucose-Capillary: 92 mg/dL (ref 70–99)

## 2020-05-04 LAB — MAGNESIUM: Magnesium: 1.3 mg/dL — ABNORMAL LOW (ref 1.7–2.4)

## 2020-05-04 LAB — PHOSPHORUS: Phosphorus: 2 mg/dL — ABNORMAL LOW (ref 2.5–4.6)

## 2020-05-04 MED ORDER — QUETIAPINE FUMARATE 100 MG PO TABS: 400.0000 mg | ORAL_TABLET | Freq: Every day | ORAL | Status: DC

## 2020-05-04 MED ORDER — CYCLOBENZAPRINE HCL 10 MG PO TABS: 10.0000 mg | ORAL_TABLET | Freq: Two times a day (BID) | ORAL | Status: DC | PRN

## 2020-05-04 MED ORDER — HYDROXYZINE HCL 10 MG PO TABS
10.0000 mg | ORAL_TABLET | Freq: Three times a day (TID) | ORAL | Status: DC | PRN
  Filled 2020-05-04: qty 1

## 2020-05-04 MED ORDER — CLONAZEPAM 0.25 MG PO TBDP: 0.2500 mg | ORAL_TABLET | Freq: Two times a day (BID) | ORAL | Status: DC | PRN

## 2020-05-04 MED ORDER — GABAPENTIN 300 MG PO CAPS
600.0000 mg | ORAL_CAPSULE | Freq: Three times a day (TID) | ORAL | Status: DC
  Administered 2020-05-04 – 2020-05-10 (×18): 600 mg via ORAL
  Filled 2020-05-04 (×16): qty 2
  Filled 2020-05-04: qty 6
  Filled 2020-05-04: qty 2

## 2020-05-04 MED ORDER — DIVALPROEX SODIUM 500 MG PO DR TAB
500.0000 mg | DELAYED_RELEASE_TABLET | Freq: Two times a day (BID) | ORAL | Status: DC
  Administered 2020-05-04 – 2020-05-10 (×13): 500 mg via ORAL
  Filled 2020-05-04 (×14): qty 1

## 2020-05-04 MED ORDER — DULOXETINE HCL 60 MG PO CPEP
60.0000 mg | ORAL_CAPSULE | Freq: Every day | ORAL | Status: DC
  Administered 2020-05-04 – 2020-05-10 (×7): 60 mg via ORAL
  Filled 2020-05-04 (×5): qty 1
  Filled 2020-05-04: qty 2
  Filled 2020-05-04: qty 1

## 2020-05-04 MED ORDER — GABAPENTIN 250 MG/5ML PO SOLN
600.0000 mg | Freq: Three times a day (TID) | ORAL | Status: DC
  Filled 2020-05-04: qty 12

## 2020-05-04 MED ORDER — POTASSIUM PHOSPHATES 15 MMOLE/5ML IV SOLN
20.0000 mmol | Freq: Once | INTRAVENOUS | Status: AC
  Administered 2020-05-04: 20 mmol via INTRAVENOUS
  Filled 2020-05-04: qty 6.67

## 2020-05-04 MED ORDER — QUETIAPINE FUMARATE 50 MG PO TABS: 50.0000 mg | ORAL_TABLET | Freq: Every day | ORAL | Status: DC

## 2020-05-04 MED ORDER — MAGNESIUM SULFATE 4 GM/100ML IV SOLN
4.0000 g | Freq: Once | INTRAVENOUS | Status: AC
  Administered 2020-05-04: 4 g via INTRAVENOUS
  Filled 2020-05-04: qty 100

## 2020-05-04 MED ORDER — HYDROXYZINE HCL 10 MG PO TABS
10.0000 mg | ORAL_TABLET | Freq: Three times a day (TID) | ORAL | Status: DC | PRN
  Administered 2020-05-09: 10 mg via ORAL
  Filled 2020-05-04 (×3): qty 1

## 2020-05-04 MED ORDER — CYCLOBENZAPRINE HCL 10 MG PO TABS
10.0000 mg | ORAL_TABLET | Freq: Two times a day (BID) | ORAL | Status: DC | PRN
  Administered 2020-05-05 – 2020-05-10 (×6): 10 mg via ORAL
  Filled 2020-05-04 (×6): qty 1

## 2020-05-04 MED ORDER — CLONAZEPAM 0.25 MG PO TBDP
0.2500 mg | ORAL_TABLET | Freq: Two times a day (BID) | ORAL | Status: DC | PRN
Start: 2020-05-04 — End: 2020-05-10
  Administered 2020-05-05 – 2020-05-10 (×7): 0.25 mg via ORAL
  Filled 2020-05-04 (×7): qty 1

## 2020-05-04 MED ORDER — QUETIAPINE FUMARATE 50 MG PO TABS
50.0000 mg | ORAL_TABLET | Freq: Every day | ORAL | Status: DC
  Filled 2020-05-04: qty 1

## 2020-05-04 NOTE — Progress Notes (Signed)
NAME:  Juan Hester, MRN:  [id number], DOB:  06-14-1980, LOS: 1 ADMISSION DATE:  05/03/2020, CONSULTATION DATE:  05/04/2019 REFERRING MD:  Dr. [name], CHIEF COMPLAINT:  Overdose    History of Present Illness:  40 year old male presents to ED on 1/19 after reported overdose. EMS reports that patient was on facebook live reporting he was going to kill himself. When EMS arrived to scene patient pill bottles including Seroquel, Flexeril, Oxycodone, Klonopin, Cymbalta were all empty, and patient was minimally responsive. Given 2 mg Narcan with little effect. On arrival to ED remained unresponsive after multiple doses of narcan and a narcan infusion requiring intubation. ETOH level 206. Dr. [name]  (EDP) completed IVC paperwork. CT head negative. Critical Care Consulted for admission.   Past Medical History:  Chronic Foot Pain, Chron's Disease, H/O PE now off Xarelto, Bipolar Disorder   Significant Hospital Events:  1/19 Presents to ED  1/20 Extubated  Consults:  Psychiatry  Procedures:  ETT 1/19 >> 1/20  Significant Diagnostic Tests:  CT Head 1/19 > negative  Micro Data:  COVID/Flu 1/19 >> negative MRSA PCR 1/19 >> negative  Antimicrobials:     Interim History / Subjective:  Pressure support.  Objective   Blood pressure 111/78, pulse 92, temperature (!) 101 F (38.3 C), temperature source Oral, resp. rate 16, height 6' 1"  (1.854 m), weight 107.1 kg, SpO2 99 %.    Vent Mode: PRVC FiO2 (%):  [40 %-100 %] 60 % Set Rate:  [16 bmp] 16 bmp Vt Set:  [630 mL] 630 mL PEEP:  [5 cmH20] 5 cmH20 Pressure Support:  [5 cmH20] 5 cmH20 Plateau Pressure:  [16 cmH20-22 cmH20] 16 cmH20   Intake/Output Summary (Last 24 hours) at 05/04/2020 0744 Last data filed at 05/04/2020 0645 Gross per 24 hour  Intake 2045.82 ml  Output 590 ml  Net 1455.82 ml   Filed Weights   05/04/20 0500  Weight: 107.1 kg    Examination:  General - alert Eyes - pupils reactive ENT - ETT in place Cardiac -  regular rate/rhythm, no murmur Chest - equal breath sounds b/l, no wheezing or rales Abdomen - soft, non tender, + bowel sounds Extremities - no cyanosis, clubbing, or edema Skin - no rashes Neuro - normal strength, moves extremities, follows commands   Resolved Hospital Problem list   Acute metabolic encephalopathy from ETOH intoxication and intentional overdose  Assessment & Plan:   Intentional overdose. Hx of Bipolar disease, schizoaffective disorder. - consult psychiatry - bedside sitter  Compromised airway. - extubate 1/20  Chronic pain. - resume meds  Hx of Crohn's disease. - supportive care  Best practice (evaluated daily)  Diet: regular diet DVT prophylaxis: Heparin sq GI prophylaxis: PPI Mobility: as tolerated Disposition: ICU   Code Status: FC  Labs    CMP Latest Ref Rng & Units 05/04/2020 05/03/2020 05/03/2020  Glucose 70 - 99 mg/dL 105(H) - -  BUN 6 - 20 mg/dL 10 - -  Creatinine 0.61 - 1.24 mg/dL 1.04 - -  Sodium 135 - 145 mmol/L 139 141 141  Potassium 3.5 - 5.1 mmol/L 3.6 3.9 3.2(L)  Chloride 98 - 111 mmol/L 105 - -  CO2 22 - 32 mmol/L 23 - -  Calcium 8.9 - 10.3 mg/dL 8.4(L) - -  Total Protein 6.5 - 8.1 g/dL - - -  Total Bilirubin 0.3 - 1.2 mg/dL - - -  Alkaline Phos 38 - 126 U/L - - -  AST 15 - 41 U/L - - -  ALT 0 - 44 U/L - - -    CBC Latest Ref Rng & Units 05/04/2020 05/03/2020 05/03/2020  WBC 4.0 - 10.5 K/uL 8.3 - -  Hemoglobin 13.0 - 17.0 g/dL 12.6(L) 12.9(L) 12.6(L)  Hematocrit 39.0 - 52.0 % 37.3(L) 38.0(L) 37.0(L)  Platelets 150 - 400 K/uL 106(L) - -    ABG    Component Value Date/Time   PHART 7.357 05/03/2020 1144   PCO2ART 42.0 05/03/2020 1144   PO2ART 415 (H) 05/03/2020 1144   HCO3 23.6 05/03/2020 1144   TCO2 25 05/03/2020 1144   ACIDBASEDEF 2.0 05/03/2020 1144   O2SAT 100.0 05/03/2020 1144    CBG (last 3)  Recent Labs    05/04/20 0007 05/04/20 0323 05/04/20 0739  GLUCAP 70 92 105*    Critical care time: 31 minutes   Chesley Mires, MD [place] Pager - [phone] 05/04/2020, 7:50 AM

## 2020-05-04 NOTE — Plan of Care (Signed)
  Problem: Safety: Goal: Non-violent Restraint(s) Outcome: Progressing   Problem: Clinical Measurements: Goal: Respiratory complications will improve Outcome: Progressing Note: Pt extubated without any difficulty and his oxygen saturations are in the mid to high 90s on 2L [place]   Problem: Pain Managment: Goal: General experience of comfort will improve Outcome: Progressing   Problem: Safety: Goal: Non-violent Restraint(s) Outcome: Progressing   Problem: Nutrition: Goal: Adequate nutrition will be maintained Outcome: Not Progressing Note: Tube feeds were not started. Pt is currently extubated and we progress diet as tolerated by patient    Problem: Activity: Goal: Ability to tolerate increased activity will improve Outcome: Completed/Met   Problem: Respiratory: Goal: Ability to maintain a clear airway and adequate ventilation will improve Outcome: Completed/Met   Problem: Role Relationship: Goal: Method of communication will improve Outcome: Completed/Met

## 2020-05-04 NOTE — Consult Note (Signed)
[facility] Face-to-Face Psychiatry Consult   Reason for Consult:  SUicide attempt Referring Physician:  Dr. Halford Chessman Patient Identification: Juan Hester MRN:  [id number] Principal Diagnosis: <principal problem not specified> Diagnosis:  Active Problems:   Overdose   Total Time spent with patient: 45 minutes  Subjective:   Juan Hester is a 40 y.o. male patient admitted with intentional overdose.  Today patient is calm and cooperative, alert and oriented x 2, engages well in conversation, however was recently extubated moments prior to this consult being placed. Patient psychiatric evaluation was limited due to recent extubation and sedation. He endorses significant anxiety and depression, that is poorly managed with his medications. When assessing his reason for admission he states " what hasn't went wrong with my life. I have nothing good to say." He is unable to identify or admit to his suicide attempt. He does have a small grin when advised of his alleged attempt on Facebook live. Patient has no insight at this time, and altered mentation again this is unclear if it is due to substance ingestion or recent extubation. He denies suicidal ideations at this time. He appears to answer most questions appropriately although delayed and minimal responses. He denies any psychosis, hallucinations, or mania at this time.  Last visit with psychiatry was on 04/19/20 where he requested a 2 month supply of his prescription, in which his bottles were empty on arrival by EMS.   HPI:  40 year old male presents to ED on 1/19 after reported overdose. EMS reports that patient was on facebook live reporting he was going to kill himself. When EMS arrived to scene patient pill bottles including Seroquel, Flexeril, Oxycodone, Klonopin, Cymbalta were all empty, and patient was minimally responsive. Given 2 mg Narcan with little effect. On arrival to ED remained unresponsive after multiple doses of narcan and a narcan infusion  requiring intubation. ETOH level 206. Dr. [name]  (EDP) completed IVC paperwork. CT head negative. Critical Care Consulted for admission  Past Psychiatric History: Schizoaffective disorder, Bipolar 1 disoder, PTSD, anxiety and depression. He is cuurently being managed on Cymbalta 15m po daily, hydroxyzine 283mpo TID, Seroquel 4003mo qhs and Seroquel 67m40m qam, Depakote 500mg53mBID, Klonopin 0.5mg p64mID. [facility].   Risk to Self:  Yes  Risk to Others:  Denies Prior Inpatient Therapy:   Prior Out[facility]:  Yes, [facility] History:  Past Medical History:  Diagnosis Date  . Acute renal failure (HCC) [id number]  . Anxiety    per pt report  . Colon cancer (HCC)  [place]  [place]    [place]  . ETOH abuse 10/16/2019  . Foot drop, right   . Multiple subsegmental pulmonary emboli without acute cor pulmonale (HCC) [id number]  . Myocardial infarction (HCC)  [place] (pulmonary thromboembolism) (HCC)  [place] (post-traumatic stress disorder)   . Thyroid disease     Past Surgical History:  Procedure Laterality Date  . ABDOMINAL SURGERY    . COLON SURGERY    . IR RADIOLOGIST EVAL & MGMT  08/31/2019  . OSTOMY    . SMALL INTESTINE SURGERY     Family History: No family history on file. Family Psychiatric  History:  Social History:  Social History   Substance and Sexual Activity  Alcohol Use Yes     Social History   Substance  and Sexual Activity  Drug Use Not Currently    Social History   Socioeconomic History  . Marital status: Divorced    Spouse name: Not on file  . Number of children: Not on file  . Years of education: Not on file  . Highest education level: Not on file  Occupational History  . Not on file  Tobacco Use  . Smoking status: Current Every Day Smoker    Packs/day: 1.00  . Smokeless tobacco: Never Used  Vaping Use  .  Vaping Use: Never used  Substance and Sexual Activity  . Alcohol use: Yes  . Drug use: Not Currently  . Sexual activity: Yes  Other Topics Concern  . Not on file  Social History Narrative  . Not on file   Social Determinants of Health   Financial Resource Strain: Not on file  Food Insecurity: Not on file  Transportation Needs: Not on file  Physical Activity: Not on file  Stress: Not on file  Social Connections: Not on file   Additional Social History:    Allergies:   Allergies  Allergen Reactions  . Azathioprine Other (See Comments)    Pt does not recall reaction    . Ciprofloxacin Other (See Comments)    Pt does not recall reaction    . Mesalamine Other (See Comments)    Pt does not recall reaction  . Metronidazole Other (See Comments)    Pt does not recall reaction   . Sulfa Antibiotics Other (See Comments)    Pt does not recall reaction   . Barium Sulfate Rash  . Barium-Containing Compounds Rash    Labs:  Results for orders placed or performed during the hospital encounter of 05/03/20 (from the past 48 hour(s))  CBG monitoring, ED     Status: Abnormal   Collection Time: 05/03/20  2:17 AM  Result Value Ref Range   Glucose-Capillary 103 (H) 70 - 99 mg/dL    Comment: Glucose reference range applies only to samples taken after fasting for at least 8 hours.  Resp Panel by RT-PCR (Flu A&B, Covid) Nasopharyngeal Swab     Status: None   Collection Time: 05/03/20  2:20 AM   Specimen: Nasopharyngeal Swab; Nasopharyngeal(NP) swabs in vial transport medium  Result Value Ref Range   SARS Coronavirus 2 by RT PCR NEGATIVE NEGATIVE    Comment: (NOTE) SARS-CoV-2 target nucleic acids are NOT DETECTED.  The SARS-CoV-2 RNA is generally detectable in upper respiratory specimens during the acute phase of infection. The lowest concentration of SARS-CoV-2 viral copies this assay can detect is 138 copies/mL. A negative result does not preclude SARS-Cov-2 infection and should not  be used as the sole basis for treatment or other patient management decisions. A negative result may occur with  improper specimen collection/handling, submission of specimen other than nasopharyngeal swab, presence of viral mutation(s) within the areas targeted by this assay, and inadequate number of viral copies(<138 copies/mL). A negative result must be combined with clinical observations, patient history, and epidemiological information. The expected result is Negative.  Fact Sheet for Patients:  EntrepreneurPulse.com.au  Fact Sheet for Healthcare Providers:  IncredibleEmployment.be  This test is no t yet approved or cleared by the [place] FDA and  has been authorized for detection and/or diagnosis of SARS-CoV-2 by FDA under an Emergency Use Authorization (EUA). This EUA will remain  in effect (meaning this test can be used) for the duration of the COVID-19 declaration under Section 564(b)(1) of the Act, 21 U.S.C.section  360bbb-3(b)(1), unless the authorization is terminated  or revoked sooner.       Influenza A by PCR NEGATIVE NEGATIVE   Influenza B by PCR NEGATIVE NEGATIVE    Comment: (NOTE) The Xpert Xpress SARS-CoV-2/FLU/RSV plus assay is intended as an aid in the diagnosis of influenza from Nasopharyngeal swab specimens and should not be used as a sole basis for treatment. Nasal washings and aspirates are unacceptable for Xpert Xpress SARS-CoV-2/FLU/RSV testing.  Fact Sheet for Patients: EntrepreneurPulse.com.au  Fact Sheet for Healthcare Providers: IncredibleEmployment.be  This test is not yet approved or cleared by the [place] FDA and has been authorized for detection and/or diagnosis of SARS-CoV-2 by FDA under an Emergency Use Authorization (EUA). This EUA will remain in effect (meaning this test can be used) for the duration of the COVID-19 declaration under Section 564(b)(1) of  the Act, 21 U.S.C. section 360bbb-3(b)(1), unless the authorization is terminated or revoked.  Performed at [facility], [place] [address] [place], [place] [address]   Acetaminophen level     Status: Abnormal   Collection Time: 05/03/20  2:30 AM  Result Value Ref Range   Acetaminophen (Tylenol), Serum <10 (L) 10 - 30 ug/mL    Comment: (NOTE) Therapeutic concentrations vary significantly. A range of 10-30 ug/mL  may be an effective concentration for many patients. However, some  are best treated at concentrations outside of this range. Acetaminophen concentrations >150 ug/mL at 4 hours after ingestion  and >50 ug/mL at 12 hours after ingestion are often associated with  toxic reactions.  Performed at [facility], [place] [address] [place], [place] [address]   Comprehensive metabolic panel     Status: Abnormal   Collection Time: 05/03/20  2:30 AM  Result Value Ref Range   Sodium 138 135 - 145 mmol/L   Potassium 3.6 3.5 - 5.1 mmol/L   Chloride 106 98 - 111 mmol/L   CO2 23 22 - 32 mmol/L   Glucose, Bld 111 (H) 70 - 99 mg/dL    Comment: Glucose reference range applies only to samples taken after fasting for at least 8 hours.   BUN 9 6 - 20 mg/dL   Creatinine, Ser 1.29 (H) 0.61 - 1.24 mg/dL   Calcium 8.5 (L) 8.9 - 10.3 mg/dL   Total Protein 6.3 (L) 6.5 - 8.1 g/dL   Albumin 3.2 (L) 3.5 - 5.0 g/dL   AST 55 (H) 15 - 41 U/L   ALT 66 (H) 0 - 44 U/L   Alkaline Phosphatase 97 38 - 126 U/L   Total Bilirubin 0.7 0.3 - 1.2 mg/dL   GFR, Estimated >60 >60 mL/min    Comment: (NOTE) Calculated using the CKD-EPI Creatinine Equation (2021)    Anion gap 9 5 - 15    Comment: Performed at [facility], [place] [address] [place], [place] [address]  Ethanol     Status: Abnormal   Collection Time: 05/03/20  2:30 AM  Result Value Ref Range   Alcohol, Ethyl (B) 206 (H) <10 mg/dL    Comment: (NOTE) Lowest detectable limit for serum alcohol is 10 mg/dL.  For medical purposes  only. Performed at [facility], [place] [address], [place], [place] [address]   Lipase, blood     Status: None   Collection Time: 05/03/20  2:30 AM  Result Value Ref Range   Lipase 27 11 - 51 U/L    Comment: Performed at [facility], [place]  [address], [place], [place] [address]  Salicylate level     Status: Abnormal   Collection Time: 05/03/20  2:30 AM  Result Value Ref Range   Salicylate Lvl <6.4 (L) 7.0 - 30.0 mg/dL    Comment: Performed at [facility] [address], [place], [place] [address]  CBC with Differential     Status: Abnormal   Collection Time: 05/03/20  2:30 AM  Result Value Ref Range   WBC 6.8 4.0 - 10.5 K/uL   RBC 3.88 (L) 4.22 - 5.81 MIL/uL   Hemoglobin 13.7 13.0 - 17.0 g/dL   HCT 38.1 (L) 39.0 - 52.0 %   MCV 98.2 80.0 - 100.0 fL   MCH 35.3 (H) 26.0 - 34.0 pg   MCHC 36.0 30.0 - 36.0 g/dL   RDW 13.9 11.5 - 15.5 %   Platelets 129 (L) 150 - 400 K/uL   nRBC 0.0 0.0 - 0.2 %   Neutrophils Relative % 79 %   Neutro Abs 5.4 1.7 - 7.7 K/uL   Lymphocytes Relative 10 %   Lymphs Abs 0.7 0.7 - 4.0 K/uL   Monocytes Relative 9 %   Monocytes Absolute 0.6 0.1 - 1.0 K/uL   Eosinophils Relative 1 %   Eosinophils Absolute 0.1 0.0 - 0.5 K/uL   Basophils Relative 0 %   Basophils Absolute 0.0 0.0 - 0.1 K/uL   Immature Granulocytes 1 %   Abs Immature Granulocytes 0.05 0.00 - 0.07 K/uL    Comment: Performed at [facility] [address], [place], [place] [address]  Urine rapid drug screen (hosp performed)     Status: Abnormal   Collection Time: 05/03/20  3:30 AM  Result Value Ref Range   Opiates POSITIVE (A) NONE DETECTED   Cocaine NONE DETECTED NONE DETECTED   Benzodiazepines POSITIVE (A) NONE DETECTED   Amphetamines NONE DETECTED NONE DETECTED   Tetrahydrocannabinol NONE DETECTED NONE DETECTED   Barbiturates NONE DETECTED NONE DETECTED    Comment: (NOTE) DRUG SCREEN FOR MEDICAL PURPOSES ONLY.  IF CONFIRMATION IS NEEDED FOR ANY PURPOSE, NOTIFY  LAB WITHIN 5 DAYS.  LOWEST DETECTABLE LIMITS FOR URINE DRUG SCREEN Drug Class                     Cutoff (ng/mL) Amphetamine and metabolites    1000 Barbiturate and metabolites    200 Benzodiazepine                 250 Tricyclics and metabolites     300 Opiates and metabolites        300 Cocaine and metabolites        300 THC                            50 Performed at [facility], [place] [address], [place], [place] [address]   Ammonia     Status: Abnormal   Collection Time: 05/03/20  3:36 AM  Result Value Ref Range   Ammonia 52 (H) 9 - 35 umol/L    Comment: Performed at [facility], [place] [address], [place], [place] [address]  Valproic acid level     Status: Abnormal   Collection Time: 05/03/20  3:36 AM  Result Value Ref Range   Valproic Acid Lvl <10 (L) 50.0 - 100.0 ug/mL    Comment: RESULTS CONFIRMED BY MANUAL DILUTION Performed at [facility] [place],  [place] [address]   I-Stat arterial blood gas, ED     Status: Abnormal   Collection Time: 05/03/20  4:25 AM  Result Value Ref Range   pH, Arterial 7.263 (L) 7.350 - 7.450   pCO2 arterial 54.0 (H) 32.0 - 48.0 mmHg   pO2, Arterial 54 (L) 83.0 - 108.0 mmHg   Bicarbonate 24.4 20.0 - 28.0 mmol/L   TCO2 26 22 - 32 mmol/L   O2 Saturation 82.0 %   Acid-base deficit 3.0 (H) 0.0 - 2.0 mmol/L   Sodium 141 135 - 145 mmol/L   Potassium 3.2 (L) 3.5 - 5.1 mmol/L   Calcium, Ion 1.19 1.15 - 1.40 mmol/L   HCT 37.0 (L) 39.0 - 52.0 %   Hemoglobin 12.6 (L) 13.0 - 17.0 g/dL   Patient temperature 98.6 F    Collection site Radial    Drawn by RT    Sample type ARTERIAL   I-Stat arterial blood gas, ED     Status: Abnormal   Collection Time: 05/03/20 11:44 AM  Result Value Ref Range   pH, Arterial 7.357 7.350 - 7.450   pCO2 arterial 42.0 32.0 - 48.0 mmHg   pO2, Arterial 415 (H) 83.0 - 108.0 mmHg   Bicarbonate 23.6 20.0 - 28.0 mmol/L   TCO2 25 22 - 32 mmol/L   O2 Saturation 100.0 %   Acid-base deficit  2.0 0.0 - 2.0 mmol/L   Sodium 141 135 - 145 mmol/L   Potassium 3.9 3.5 - 5.1 mmol/L   Calcium, Ion 1.16 1.15 - 1.40 mmol/L   HCT 38.0 (L) 39.0 - 52.0 %   Hemoglobin 12.9 (L) 13.0 - 17.0 g/dL   Sample type ARTERIAL   Glucose, capillary     Status: None   Collection Time: 05/03/20  3:49 PM  Result Value Ref Range   Glucose-Capillary 78 70 - 99 mg/dL    Comment: Glucose reference range applies only to samples taken after fasting for at least 8 hours.  Magnesium     Status: Abnormal   Collection Time: 05/03/20  4:23 PM  Result Value Ref Range   Magnesium 1.4 (L) 1.7 - 2.4 mg/dL    Comment: Performed at [facility] [address], [place], [place] [address]  Phosphorus     Status: None   Collection Time: 05/03/20  4:23 PM  Result Value Ref Range   Phosphorus 3.5 2.5 - 4.6 mg/dL    Comment: Performed at [facility], [place] [address], [place], [place] [address]  Glucose, capillary     Status: None   Collection Time: 05/03/20  7:18 PM  Result Value Ref Range   Glucose-Capillary 73 70 - 99 mg/dL    Comment: Glucose reference range applies only to samples taken after fasting for at least 8 hours.  MRSA PCR Screening     Status: None   Collection Time: 05/03/20  8:17 PM   Specimen: Nasopharyngeal  Result Value Ref Range   MRSA by PCR NEGATIVE NEGATIVE    Comment:        The GeneXpert MRSA Assay (FDA approved for NASAL specimens only), is one component of a comprehensive MRSA colonization surveillance program. It is not intended to diagnose MRSA infection nor to guide or monitor treatment for MRSA infections. Performed at [facility], [place] [address], [place], [place] [address]   Glucose, capillary     Status: Abnormal   Collection Time: 05/03/20 11:17 PM  Result Value Ref Range  Glucose-Capillary 48 (L) 70 - 99 mg/dL    Comment: Glucose reference range applies only to samples taken after fasting for at least 8 hours.  Glucose, capillary     Status: None    Collection Time: 05/04/20 12:07 AM  Result Value Ref Range   Glucose-Capillary 70 70 - 99 mg/dL    Comment: Glucose reference range applies only to samples taken after fasting for at least 8 hours.  Glucose, capillary     Status: None   Collection Time: 05/04/20  3:23 AM  Result Value Ref Range   Glucose-Capillary 92 70 - 99 mg/dL    Comment: Glucose reference range applies only to samples taken after fasting for at least 8 hours.  Basic metabolic panel     Status: Abnormal   Collection Time: 05/04/20  4:15 AM  Result Value Ref Range   Sodium 139 135 - 145 mmol/L   Potassium 3.6 3.5 - 5.1 mmol/L   Chloride 105 98 - 111 mmol/L   CO2 23 22 - 32 mmol/L   Glucose, Bld 105 (H) 70 - 99 mg/dL    Comment: Glucose reference range applies only to samples taken after fasting for at least 8 hours.   BUN 10 6 - 20 mg/dL   Creatinine, Ser 1.04 0.61 - 1.24 mg/dL   Calcium 8.4 (L) 8.9 - 10.3 mg/dL   GFR, Estimated >60 >60 mL/min    Comment: (NOTE) Calculated using the CKD-EPI Creatinine Equation (2021)    Anion gap 11 5 - 15    Comment: Performed at [facility] [address] [place], [place] [address]  CBC     Status: Abnormal   Collection Time: 05/04/20  4:15 AM  Result Value Ref Range   WBC 8.3 4.0 - 10.5 K/uL   RBC 3.72 (L) 4.22 - 5.81 MIL/uL   Hemoglobin 12.6 (L) 13.0 - 17.0 g/dL   HCT 37.3 (L) 39.0 - 52.0 %   MCV 100.3 (H) 80.0 - 100.0 fL   MCH 33.9 26.0 - 34.0 pg   MCHC 33.8 30.0 - 36.0 g/dL   RDW 13.8 11.5 - 15.5 %   Platelets 106 (L) 150 - 400 K/uL    Comment: REPEATED TO VERIFY PLATELET COUNT CONFIRMED BY SMEAR Immature Platelet Fraction may be clinically indicated, consider ordering this additional test [id number]    nRBC 0.0 0.0 - 0.2 %    Comment: Performed at [facility], [place] [address], [place], [place] [address]  Magnesium     Status: Abnormal   Collection Time: 05/04/20  4:15 AM  Result Value Ref Range   Magnesium 1.3 (L) 1.7 - 2.4 mg/dL    Comment:  Performed at [facility] [address], [place], [place] [address]  Phosphorus     Status: Abnormal   Collection Time: 05/04/20  4:15 AM  Result Value Ref Range   Phosphorus 2.0 (L) 2.5 - 4.6 mg/dL    Comment: Performed at [facility] [address], [place], [place] [address]  Glucose, capillary     Status: Abnormal   Collection Time: 05/04/20  7:39 AM  Result Value Ref Range   Glucose-Capillary 105 (H) 70 - 99 mg/dL    Comment: Glucose reference range applies only to samples taken after fasting for at least 8 hours.    Current Facility-Administered Medications  Medication Dose Route Frequency Provider Last Rate Last Admin  . Chlorhexidine Gluconate Cloth 2 % PADS 6 each  6 each Topical Daily Juanito Doom, MD   6 each at 05/03/20 1730  . clonazePAM (KLONOPIN) disintegrating tablet 0.25 mg  0.25 mg Oral BID PRN Chesley Mires, MD      . cyclobenzaprine (FLEXERIL) tablet 10 mg  10 mg Oral BID PRN Chesley Mires, MD      . dextrose 5 % in lactated ringers infusion   Intravenous Continuous Frederik Pear, MD   Stopped at 05/04/20 1016  . divalproex (DEPAKOTE) DR tablet 500 mg  500 mg Oral BID Chesley Mires, MD   500 mg at 05/04/20 1024  . DULoxetine (CYMBALTA) DR capsule 60 mg  60 mg Oral Daily Chesley Mires, MD   60 mg at 05/04/20 1019  . gabapentin (NEURONTIN) capsule 600 mg  600 mg Oral TID Chesley Mires, MD   600 mg at 05/04/20 1019  . heparin injection 5,000 Units  5,000 Units Subcutaneous Q8H Omar Person, NP   5,000 Units at 05/04/20 0626  . hydrOXYzine (ATARAX/VISTARIL) tablet 10 mg  10 mg Oral TID PRN Chesley Mires, MD      . pantoprazole (PROTONIX) injection 40 mg  40 mg Intravenous QHS Omar Person, NP   40 mg at 05/03/20 2243  . potassium PHOSPHATE 20 mmol in dextrose 5 % 500 mL infusion  20 mmol Intravenous Once Chesley Mires, MD 84 mL/hr at 05/04/20 1411 20 mmol at 05/04/20 1411  . QUEtiapine (SEROQUEL) tablet 400 mg  400 mg Oral QHS Chesley Mires,  MD      . Derrill Memo ON 05/05/2020] QUEtiapine (SEROQUEL) tablet 50 mg  50 mg Oral Daily Chesley Mires, MD        Musculoskeletal: Strength & Muscle Tone: decreased Gait & Station: unsteady Patient leans: N/A  Psychiatric Specialty Exam: Physical Exam  Review of Systems  Blood pressure 113/90, pulse 98, temperature (!) 101 F (38.3 C), temperature source Oral, resp. rate 19, height 6' 1"  (1.854 m), weight 107.1 kg, SpO2 95 %.Body mass index is 31.15 kg/m.  General Appearance: Fairly Groomed and residual blood on mouth and teeth, teeth decay  Eye Contact:  Minimal  Speech:  Clear and Coherent and Slow  Volume:  Decreased  Mood:  Depressed  Affect:  Flat and Restricted  Thought Process:  Linear and Descriptions of Associations: Intact  Orientation:  Other:  A&O x 2  Thought Content:  Tangential and impaired  Suicidal Thoughts:  Denies, despite his overdose attempt  Homicidal Thoughts:  No  Memory:  Immediate;   Poor Recent;   Poor Remote;   Poor  Judgement:  Impaired  Insight:  Lacking  Psychomotor Activity:  Decreased  Concentration:  Concentration: Poor and Attention Span: Poor  Recall:  Poor  Fund of Knowledge:  Fair  Language:  Fair  Akathisia:  No  Handed:  Right  AIMS (if indicated):     Assets:  Communication Skills Desire for Improvement Financial Resources/Insurance Leisure Time Physical Health Resilience Social Support  ADL's:  Intact  Cognition:  WNL  Sleep:        Treatment Plan Summary: Plan Recommend inpatient psych once medically cleared. Patient mentation remains altered this maybe 2/t recent extubation or intentional ingestion of multiple substances. Continue IVC at this time as patient is a risk to himself. 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 Initiate CIWA protocol history of ETOH use disorder. Recommend working closely with SW to facilitate inpatient admission once medically stable.   -UDS positive for Opiates and  Benzodiazapine.  BAL on admission 206. Recently resumed his Klonopin, no immediate concern for BZD detox, taper or withdrawal.  - Resume home medications at this time that are appropriate.  -WIll continue to hold Seroqeul as patient is at risk for prolonged QTc. Last QTc obtained was 492 on 01/19.  Valproic acid level on admission, < 10 . Will resume Depakote at this.  -PDMP verified, Overall overdose risk factor score of 650/999. He has an active rx of oxycodone (IR) 98m po QID, last filled on 04/29/2020. Provider has been notified and patient will need referral for pain management as his current PCP will no longer prescribe his oxycodone for foot pain.   Disposition: Recommend psychiatric Inpatient admission when medically cleared.  TSuella Broad FNP 05/04/2020 2:32 PM

## 2020-05-04 NOTE — Procedures (Signed)
Extubation Procedure Note  Patient Details:   Name: Juan Hester DOB: [birth year] MRN: [id number]   Airway Documentation:    Vent end date: (not recorded) Vent end time: (not recorded)   Evaluation  O2 sats: stable throughout Complications: No apparent complications Patient did tolerate procedure well. Bilateral Breath Sounds: Clear   Yes, patient able to speak.  Patient extubated at 0742 per MD, RN at bedside. 4L nasal cannula applied. Patient tolerated well. Vitals stable.  Seward Speck 05/04/2020, 8:34 AM

## 2020-05-05 DIAGNOSIS — F319 Bipolar disorder, unspecified: Secondary | ICD-10-CM

## 2020-05-05 DIAGNOSIS — G894 Chronic pain syndrome: Secondary | ICD-10-CM

## 2020-05-05 DIAGNOSIS — K50118 Crohn's disease of large intestine with other complication: Secondary | ICD-10-CM

## 2020-05-05 DIAGNOSIS — F251 Schizoaffective disorder, depressive type: Secondary | ICD-10-CM

## 2020-05-05 LAB — BASIC METABOLIC PANEL
Anion gap: 12 (ref 5–15)
BUN: 11 mg/dL (ref 6–20)
CO2: 25 mmol/L (ref 22–32)
Calcium: 8.1 mg/dL — ABNORMAL LOW (ref 8.9–10.3)
Chloride: 100 mmol/L (ref 98–111)
Creatinine, Ser: 1 mg/dL (ref 0.61–1.24)
GFR, Estimated: >60 mL/min (ref >60)
Glucose, Bld: 109 mg/dL — ABNORMAL HIGH (ref 70–99)
Potassium: 3 mmol/L — ABNORMAL LOW (ref 3.5–5.1)
Sodium: 137 mmol/L (ref 135–145)

## 2020-05-05 LAB — PHOSPHORUS: Phosphorus: 2.4 mg/dL — ABNORMAL LOW (ref 2.5–4.6)

## 2020-05-05 LAB — MAGNESIUM: Magnesium: 1.9 mg/dL (ref 1.7–2.4)

## 2020-05-05 MED ORDER — OXYCODONE HCL 5 MG PO TABS
10.0000 mg | ORAL_TABLET | Freq: Once | ORAL | Status: AC
  Administered 2020-05-05: 10 mg via ORAL
  Filled 2020-05-05: qty 2

## 2020-05-05 MED ORDER — POTASSIUM CHLORIDE 10 MEQ/100ML IV SOLN
10.0000 meq | INTRAVENOUS | Status: AC
  Administered 2020-05-05 (×4): 10 meq via INTRAVENOUS
  Filled 2020-05-05 (×4): qty 100

## 2020-05-05 MED ORDER — KETOROLAC TROMETHAMINE 30 MG/ML IJ SOLN
30.0000 mg | Freq: Four times a day (QID) | INTRAMUSCULAR | Status: DC
  Administered 2020-05-05 – 2020-05-07 (×9): 30 mg via INTRAVENOUS
  Filled 2020-05-05 (×9): qty 1

## 2020-05-05 MED ORDER — OXYCODONE HCL 5 MG PO TABS
5.0000 mg | ORAL_TABLET | Freq: Four times a day (QID) | ORAL | Status: DC | PRN
  Administered 2020-05-05 – 2020-05-10 (×17): 5 mg via ORAL
  Filled 2020-05-05 (×17): qty 1

## 2020-05-05 MED ORDER — ADULT MULTIVITAMIN W/MINERALS CH
1.0000 | ORAL_TABLET | Freq: Every day | ORAL | Status: DC
  Administered 2020-05-05: 1 via ORAL
  Filled 2020-05-05 (×2): qty 1

## 2020-05-05 MED ORDER — OXYCODONE HCL 5 MG PO TABS: 10.0000 mg | ORAL_TABLET | Freq: Four times a day (QID) | ORAL | Status: DC | PRN

## 2020-05-05 MED ORDER — PANTOPRAZOLE SODIUM 40 MG PO TBEC
40.0000 mg | DELAYED_RELEASE_TABLET | Freq: Every day | ORAL | Status: DC
  Administered 2020-05-05 – 2020-05-09 (×5): 40 mg via ORAL
  Filled 2020-05-05 (×5): qty 1

## 2020-05-05 MED ORDER — ENSURE ENLIVE PO LIQD
237.0000 mL | Freq: Two times a day (BID) | ORAL | Status: DC
  Administered 2020-05-08 – 2020-05-10 (×4): 237 mL via ORAL

## 2020-05-05 NOTE — Consult Note (Signed)
[place] Nurse ostomy follow up Patient receiving care in [facility] 6N01. Suicide sitter in room. Stoma type/location: LUQ fecal stoma Stomal assessment/size: moist, round, slightly budded Peristomal assessment: deferred Treatment options for stomal/peristomal skin: Patient uses paste at home. I explained we don't have paste in our supply, we have barrier rings.  He can use the barrier rings, or not. It was his decision, and that the staff would be able to help him with the barrier ring if he wished. Output: corn kernels and thin brown stool in existing one piece pouch  Ostomy pouching: 2pc. 2 and 3/4 inches  Education provided: none Enrolled patient in [employer] Discharge program: When at home, patient is independent in ostomy care. Thank you for the consult.  Discussed plan of care with the patient and bedside nurse.  [place] nurse will not follow at this time.  Please re-consult the [facility] team if needed.  Val Riles, RN, MSN, CWOCN, CNS-BC, pager [phone]

## 2020-05-05 NOTE — Progress Notes (Signed)
RN called asking for home pain med to be ordered. Pt c/o 8/10 back pain. Pt takes oxycodone 65m every 6 hrs as needed Psych assessment noted 'PDMP verified, Overall overdose risk factor score of 650/999. He has an active rx of oxycodone (IR) 117mpo QID, last filled on 04/29/2020. Provider has been notified and patient will need referral for pain management as his current PCP will no longer prescribe his oxycodone for foot pain.   Will give 1 dose of 10 mg oxycodone based on CCM notes & dayteam to clarify pain regimen  Ashvin Adelson V. AlElsworth SohoD

## 2020-05-05 NOTE — Consult Note (Signed)
  Patient seen and evaluated by psychiatry service yesterday.  Patient continues to meet inpatient criteria at this time due to recent suicide attempt by intentional ingestion of multiple medications.  Recommend working closely with social work to facilitate inpatient admission to psychiatric hospital. -If patient is medically stable at this time, recommend placing social work consult for inpatient admission to psychiatric hospital.  If no appropriate beds are available at [facility], please refer out. -Psychiatry to sign off at this time.

## 2020-05-05 NOTE — Progress Notes (Signed)
PROGRESS NOTE  Juan Hester OEU:[id number] DOB: 1981-03-28 DOA: 05/03/2020 PCP: [name], MD  Brief History   40 year old male presents to ED on 1/19 after reported overdose. EMS reports that patient was on facebook live reporting he was going to kill himself. When EMS arrived to scene patient pill bottles including Seroquel, Flexeril, Oxycodone, Klonopin, Cymbalta were all empty, and patient was minimally responsive. Given 2 mg Narcan with little effect. On arrival to ED remained unresponsive after multiple doses of narcan and a narcan infusion requiring intubation. ETOH level 206. Dr. [name]  (EDP) completed IVC paperwork. CT head negative. Critical Care Consulted for admission  The patient has been transferred to the floor and TRH has assumed care of the patient.   Apparently early this morning the patient was requesting continuation of his home oxycodone which he takes at home. Dr. [name] commented that the patient has an overall overdose risk factor score of 650/999. He did give the patient 10 mg of oxycodone. When the patient asked for pain meds later this morning, I continued him on 5 mg oxycodone q6hr prn. This is 1/3 of what he is prescribed at home. The patient's provider has been notified of the patient's admission for suicide by overdose. The patient will need a referral to a pain management clinic when he is discharged to home.  Psychiatry has evaluated the patient and has stated that the patient is appropriate for admission to inpatient psychiatric treatment. TOC has been consulted to place the patient. He is IVC'd.  Consultants  . PCCM . Psychiatry  Procedures  . Mechanical ventilation/extubation  Antibiotics   Anti-infectives (From admission, onward)   None    .  Subjective  The patient is resting comfortably. He is requesting the oxycodone 15 mg q6 that he takes at home. He has been told that this will not happen.  Objective   Vitals:  Vitals:   05/05/20 1207  05/05/20 1652  BP: 106/73 108/70  Pulse: 84 80  Resp: 18 19  Temp: 97.8 F (36.6 C) 97.7 F (36.5 C)  SpO2: 98% 97%   Exam:  Constitutional:  . The patient is awake, alert, and oriented x 3. No acute distress. Respiratory:  . No increased work of breathing. . No wheezes, rales, or rhonchi . No tactile fremitus Cardiovascular:  . Regular rate and rhythm . No murmurs, ectopy, or gallups. . No lateral PMI. No thrills. Abdomen:  . Abdomen is soft, non-tender, non-distended . No hernias, masses, or organomegaly . Normoactive bowel sounds.  Musculoskeletal:  . No cyanosis, clubbing, or edema Skin:  . No rashes, lesions, ulcers . palpation of skin: no induration or nodules Neurologic:  . CN 2-12 intact . Sensation all 4 extremities intact Psychiatric:  . The patient's affect is flat and his mood is depressed.  I have personally reviewed the following:   Today's Data  . Vitals, BMP  Micro Data  . MRSA by PCR is negative  Imaging  . CT head  Scheduled Meds: . divalproex  500 mg Oral BID  . DULoxetine  60 mg Oral Daily  . gabapentin  600 mg Oral TID  . heparin  5,000 Units Subcutaneous Q8H  . ketorolac  30 mg Intravenous Q6H  . pantoprazole  40 mg Oral QHS   Continuous Infusions: . dextrose 5% lactated ringers Stopped (05/04/20 1016)    Active Problems:   Overdose Chronic pain Bipolar disorder  LOS: 2 days   A & P  Intentional  overdose in suicide attempt: The patient has a history of Bipolar disease and schizoaffective disorder. Psychiatry has evaluated the patient and has stated that the patient is appropriate for admission to inpatient psychiatric treatment. TOC has been consulted to place the patient. He is IVC'd and on suicide precautions.   Compromised airway: The patient was extubated on 05/04/2020.  Chronic pain: The patient is on oxycodone 15 mg Q6h at home. This has been continued at 5 mg Q6 here.  This is 1/3 of what he is prescribed at home.  Dr. [name] commented that the patient has an overall overdose risk factor score of 650/999. The patient's provider has been notified of the patient's admission for suicide by overdose. The patient will need a referral to a pain management clinic when he is discharged to home.  Hx of Crohn's disease: Noted. Supportive care  I have seen and examined this patient myself. I have spent 38 minute in his evaluation and care.  DVT Prophylaxis: Heparin SubQ CODE STATUS: Full Code Family Communication: None available Disposition: Status is: Inpatient  Remains inpatient appropriate because:Unsafe d/c plan   Dispo: The patient is from: Home              Anticipated d/c is to: inpatient psychiatric facility              Anticipated d/c date is: 3 days              Patient currently is medically stable to d/c.   Difficult to place patient Yes Juan Passey, DO [facility] Direct contact: see [email or web address]  7PM-7AM contact night coverage as above 05/05/2020, 5:42 PM  LOS: 2 days

## 2020-05-05 NOTE — Progress Notes (Signed)
Nutrition Follow-up  DOCUMENTATION CODES:   Obesity unspecified  INTERVENTION:   -Ensure Enlive po BID, each supplement provides 350 kcal and 20 grams of protein -MVI with minerals daily  NUTRITION DIAGNOSIS:   Inadequate oral intake related to inability to eat as evidenced by NPO status.  Progressing; advanced to regular diet on 05/04/20   GOAL:   Patient will meet greater than or equal to 90% of their needs  Progressing  MONITOR:   PO intake,Supplement acceptance,Labs,Weight trends,Skin,I & O's  REASON FOR ASSESSMENT:   Ventilator,Consult Enteral/tube feeding initiation and management  ASSESSMENT:   40 year old male who presented to the ED on 1/19 after an intentional overdose/suicide attempt including multiple medications and alcohol. Pt intubated in the ED. PMH of bipolar disorder, PTSD, depression, anxiety, chronic pain disorder, EtOH abuse, Crohn's disorder. 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 RD did not disturb. Noted safety sitter at bedside.   Pt with good appetite. Noted meal completion 100%.   Per chart review, plan for transfer for inpatient psychiatric admission.   Medications reviewed  Labs reviewed: K: 3.0, CBGS: 70-105 (inpatient orders for glycemic control are none).   NUTRITION - FOCUSED PHYSICAL EXAM:  Flowsheet Row Most Recent Value  Orbital Region No depletion  Upper Arm Region No depletion  Thoracic and Lumbar Region No depletion  Buccal Region No depletion  Temple Region No depletion  Clavicle Bone Region No depletion  Clavicle and Acromion Bone Region No depletion  Scapular Bone Region No depletion  Dorsal Hand No depletion  Patellar Region No depletion  Anterior Thigh Region No depletion  Posterior Calf Region No depletion  Edema (RD Assessment) None  Hair  Reviewed  Eyes Reviewed  Mouth Reviewed  Skin Reviewed  Nails Reviewed       Diet Order:   Diet Order            Diet regular Room service appropriate? Yes; Fluid consistency: Thin  Diet effective now                 EDUCATION NEEDS:   No education needs have been identified at this time  Skin:  Skin Assessment: Reviewed RN Assessment  Last BM:  05/04/20 (250 ml output via colostomy)  Height:   Ht Readings from Last 1 Encounters:  05/03/20 6' 1"  (1.854 m)    Weight:   Wt Readings from Last 1 Encounters:  05/04/20 107.1 kg    Ideal Body Weight:  83.6 kg  BMI:  Body mass index is 31.15 kg/m.  Estimated Nutritional Needs:   Kcal:  2100-2300  Protein:  110-125 grams  Fluid:  > 2 L    Loistine Chance, RD, LDN, [place] Registered Dietitian II Certified Diabetes Care and Education Specialist Please refer to [facility] for RD and/or RD on-call/weekend/after hours pager

## 2020-05-06 DIAGNOSIS — T1491XA Suicide attempt, initial encounter: Secondary | ICD-10-CM

## 2020-05-06 DIAGNOSIS — K50919 Crohn's disease, unspecified, with unspecified complications: Secondary | ICD-10-CM

## 2020-05-06 LAB — BASIC METABOLIC PANEL
Anion gap: 9 (ref 5–15)
BUN: 15 mg/dL (ref 6–20)
CO2: 24 mmol/L (ref 22–32)
Calcium: 8.4 mg/dL — ABNORMAL LOW (ref 8.9–10.3)
Chloride: 103 mmol/L (ref 98–111)
Creatinine, Ser: 1.23 mg/dL (ref 0.61–1.24)
GFR, Estimated: >60 mL/min (ref >60)
Glucose, Bld: 142 mg/dL — ABNORMAL HIGH (ref 70–99)
Potassium: 4 mmol/L (ref 3.5–5.1)
Sodium: 136 mmol/L (ref 135–145)

## 2020-05-06 NOTE — Progress Notes (Signed)
PROGRESS NOTE  Juan Hester:[id number] DOB: 08/09/1980 DOA: 05/03/2020 PCP: [name], MD  Brief History   40 year old male presents to ED on 1/19 after reported overdose. EMS reports that patient was on facebook live reporting he was going to kill himself. When EMS arrived to scene patient pill bottles including Seroquel, Flexeril, Oxycodone, Klonopin, Cymbalta were all empty, and patient was minimally responsive. Given 2 mg Narcan with little effect. On arrival to ED remained unresponsive after multiple doses of narcan and a narcan infusion requiring intubation. ETOH level 206. Dr. [name]  (EDP) completed IVC paperwork. CT head negative. Critical Care Consulted for admission  The patient has been transferred to the floor and TRH has assumed care of the patient.   Apparently early this morning the patient was requesting continuation of his home oxycodone which he takes at home. Dr. [name] commented that the patient has an overall overdose risk factor score of 650/999. He did give the patient 10 mg of oxycodone. When the patient asked for pain meds later this morning, I continued him on 5 mg oxycodone q6hr prn. This is 1/3 of what he is prescribed at home. The patient's provider has been notified of the patient's admission for suicide by overdose. The patient will need a referral to a pain management clinic when he is discharged to home.  Psychiatry has evaluated the patient and has stated that the patient is appropriate for admission to inpatient psychiatric treatment. TOC has been consulted to place the patient. He is IVC'd.  Consultants  . PCCM . Psychiatry  Procedures  . Mechanical ventilation/extubation  Antibiotics   Anti-infectives (From admission, onward)   None     Subjective  The patient is resting comfortably. Pt states that toradol does not address his back or foot pain.   Objective   Vitals:  Vitals:   05/05/20 2101 05/06/20 1345  BP: 114/63 106/75  Pulse: 88  77  Resp: 20 18  Temp: 98.1 F (36.7 C) 97.8 F (36.6 C)  SpO2: 95% 96%   Exam:  Constitutional:  . The patient is awake, alert, and oriented x 3. No acute distress. Respiratory:  . No increased work of breathing. . No wheezes, rales, or rhonchi . No tactile fremitus Cardiovascular:  . Regular rate and rhythm . No murmurs, ectopy, or gallups. . No lateral PMI. No thrills. Abdomen:  . Abdomen is soft, non-tender, non-distended . No hernias, masses, or organomegaly . Normoactive bowel sounds.  Musculoskeletal:  . No cyanosis, clubbing, or edema Skin:  . No rashes, lesions, ulcers . palpation of skin: no induration or nodules Neurologic:  . CN 2-12 intact . Sensation all 4 extremities intact Psychiatric:  . The patient's affect is flat and his mood is depressed.  I have personally reviewed the following:   Today's Data  . Vitals, BMP  Micro Data  . MRSA by PCR is negative  Imaging  . CT head  Scheduled Meds: . divalproex  500 mg Oral BID  . DULoxetine  60 mg Oral Daily  . feeding supplement  237 mL Oral BID BM  . gabapentin  600 mg Oral TID  . heparin  5,000 Units Subcutaneous Q8H  . ketorolac  30 mg Intravenous Q6H  . pantoprazole  40 mg Oral QHS   Continuous Infusions: . dextrose 5% lactated ringers Stopped (05/04/20 1016)    Active Problems:   Overdose Chronic pain Bipolar disorder  LOS: 3 days   A & P  Intentional  overdose in suicide attempt: The patient has a history of Bipolar disease and schizoaffective disorder. Psychiatry has evaluated the patient and has stated that the patient is appropriate for admission to inpatient psychiatric treatment. TOC has been consulted to place the patient. He is IVC'd and on suicide precautions. Awaiting placement in inpatient psychiatric facility.   Compromised airway: The patient was extubated on 05/04/2020.  Chronic pain: The patient is on oxycodone 15 mg Q6h at home. This has been continued at 5 mg Q6  here.  This is 1/3 of what he is prescribed at home. Dr. [name] commented that the patient has an overall overdose risk factor score of 650/999. The patient's provider has been notified of the patient's admission for suicide by overdose. The patient will need a referral to a pain management clinic when he is discharged to home.  Hx of Crohn's disease: Noted. Supportive care  I have seen and examined this patient myself. I have spent 32 minute in his evaluation and care.  DVT Prophylaxis: Heparin SubQ CODE STATUS: Full Code Family Communication: None available Disposition: Status is: Inpatient  Remains inpatient appropriate because:Unsafe d/c plan   Dispo: The patient is from: Home              Anticipated d/c is to: inpatient psychiatric facility              Anticipated d/c date is: 3 days              Patient currently is medically stable to d/c.   Difficult to place patient Yes Juan Swayze, DO [facility] Direct contact: see [email or web address]  7PM-7AM contact night coverage as above 05/06/2020, 2:06 PM  LOS: 2 days

## 2020-05-07 MED ORDER — NAPROXEN 250 MG PO TABS
500.0000 mg | ORAL_TABLET | Freq: Two times a day (BID) | ORAL | Status: DC
  Administered 2020-05-07 – 2020-05-10 (×6): 500 mg via ORAL
  Filled 2020-05-07 (×6): qty 2

## 2020-05-07 NOTE — TOC Progression Note (Signed)
Transition of Care [facility]) - Progression Note    Patient Details  Name: Juan Hester MRN: [id number] Date of Birth: [birth year]  Transition of Care [facility]) CM/SW [place], LCSW Phone Number: 05/07/2020, 9:23 AM  Clinical Narrative: CSW sent referrals for inpatient dual diagnosis treatment as patient is now medically cleared. CSW sent referrals to placements patient is eligible for admission to which includes; [place], [facility], [address], [facility], [facility], [facility], [place], [facility], [place], [place], [facility] and [place] (SDOH) Interventions    Readmission Risk Interventions No flowsheet data found.

## 2020-05-07 NOTE — Progress Notes (Signed)
PROGRESS NOTE  Juan Hester ZGY:[id number] DOB: 04/30/1980 DOA: 05/03/2020 PCP: [name], MD  Brief History   40 year old male presents to ED on 1/19 after reported overdose. EMS reports that patient was on facebook live reporting he was going to kill himself. When EMS arrived to scene patient pill bottles including Seroquel, Flexeril, Oxycodone, Klonopin, Cymbalta were all empty, and patient was minimally responsive. Given 2 mg Narcan with little effect. On arrival to ED remained unresponsive after multiple doses of narcan and a narcan infusion requiring intubation. ETOH level 206. Dr. [name]  (EDP) completed IVC paperwork. CT head negative. Critical Care Consulted for admission  The patient has been transferred to the floor and TRH has assumed care of the patient.   Apparently early this morning the patient was requesting continuation of his home oxycodone which he takes at home. Dr. [name] commented that the patient has an overall overdose risk factor score of 650/999. He did give the patient 10 mg of oxycodone. When the patient asked for pain meds later this morning, I continued him on 5 mg oxycodone q6hr prn. This is 1/3 of what he is prescribed at home. The patient's provider has been notified of the patient's admission for suicide by overdose. The patient will need a referral to a pain management clinic when he is discharged to home.  Psychiatry has evaluated the patient and has stated that the patient is appropriate for admission to inpatient psychiatric treatment. TOC has been consulted to place the patient. He is IVC'd.  Pt continues to complain that gabapentin, neurontin, tylenol, and heating pad do nothing to help with his back pain. He is aware that his oxycodone will not be increased.  Consultants  . PCCM . Psychiatry  Procedures  . Mechanical ventilation/extubation  Antibiotics   Anti-infectives (From admission, onward)   None     Subjective  The patient is resting  comfortably. He is complaining of back pain that is unresolved with toradol, neurontin, tylenol, heating pad, and oxycodone 5 mg.  Objective   Vitals:  Vitals:   05/06/20 2139 05/07/20 0441  BP: 115/76 108/80  Pulse: 74 72  Resp: 16 18  Temp: 98.8 F (37.1 C) (!) 97.5 F (36.4 C)  SpO2: 97% 94%   Exam:  Constitutional:  . The patient is awake, alert, and oriented x 3. No acute distress. Respiratory:  . No increased work of breathing. . No wheezes, rales, or rhonchi . No tactile fremitus Cardiovascular:  . Regular rate and rhythm . No murmurs, ectopy, or gallups. . No lateral PMI. No thrills. Abdomen:  . Abdomen is soft, non-tender, non-distended . No hernias, masses, or organomegaly . Normoactive bowel sounds.  Musculoskeletal:  . No cyanosis, clubbing, or edema Skin:  . No rashes, lesions, ulcers . palpation of skin: no induration or nodules Neurologic:  . CN 2-12 intact . Sensation all 4 extremities intact Psychiatric:  . The patient's affect is flat and his mood is depressed.  I have personally reviewed the following:   Today's Data  . Vitals, BMP  Micro Data  . MRSA by PCR is negative  Imaging  . CT head  Scheduled Meds: . divalproex  500 mg Oral BID  . DULoxetine  60 mg Oral Daily  . feeding supplement  237 mL Oral BID BM  . gabapentin  600 mg Oral TID  . heparin  5,000 Units Subcutaneous Q8H  . ketorolac  30 mg Intravenous Q6H  . pantoprazole  40 mg  Oral QHS   Continuous Infusions: . dextrose 5% lactated ringers Stopped (05/04/20 1016)    Active Problems:   Overdose Chronic pain Bipolar disorder  LOS: 40 days   A & P  Intentional overdose in suicide attempt: The patient has a history of Bipolar disease and schizoaffective disorder. Psychiatry has evaluated the patient and has stated that the patient is appropriate for admission to inpatient psychiatric treatment. TOC has been consulted to place the patient. He is IVC'd and on suicide  precautions. Awaiting placement in inpatient psychiatric facility.   Compromised airway: The patient was extubated on 05/04/2020.  Chronic pain: The patient is on oxycodone 15 mg Q6h at home. This has been continued at 5 mg Q6 here. He is also receiving neurontin, todadol, tylenol and heating pad. He says that they do nothing for him. This is 1/3 of what he is prescribed at home. Dr. [name] commented that the patient has an overall overdose risk factor score of 650/999. The patient's provider has been notified of the patient's admission for suicide by overdose. The patient will need a referral to a pain management clinic when he is discharged to home.  Hx of Crohn's disease: Noted. Supportive care  I have seen and examined this patient myself. I have spent 34 minute in his evaluation and care.  DVT Prophylaxis: Heparin SubQ CODE STATUS: Full Code Family Communication: None available Disposition: Status is: Inpatient  Remains inpatient appropriate because:Unsafe d/c plan  Dispo: The patient is from: Home              Anticipated d/c is to: inpatient psychiatric facility              Anticipated d/c date is: 3 days              Patient currently is medically stable to d/c.   Difficult to place patient Yes   Spruha Weight, DO [facility] Direct contact: see [email or web address]  7PM-7AM contact night coverage as above 05/07/2020, 1:21 PM  LOS: 40 days

## 2020-05-07 NOTE — TOC Progression Note (Signed)
Transition of Care [facility]) - Progression Note    Patient Details  Name: Juan Hester MRN: [id number] Date of Birth: 05/16/1980  Transition of Care [facility]) CM/SW [place], LCSW Phone Number: [phone] 05/07/2020, 10:26 AM  Clinical Narrative:     CSW reached out to [facility] to ascertain bed availability for patient. CSW was informed that they did not have an appropriate bed for this patient. TOC team can continue to follow up.  TOC team will continue to assist with discharge planning needs.       Expected Discharge Plan and Services                                                 Social Determinants of Health (SDOH) Interventions    Readmission Risk Interventions No flowsheet data found.

## 2020-05-08 ENCOUNTER — Telehealth (HOSPITAL_COMMUNITY): Payer: Self-pay | Admitting: Licensed Clinical Social Worker

## 2020-05-08 ENCOUNTER — Ambulatory Visit (HOSPITAL_COMMUNITY): Payer: No Payment, Other | Admitting: Licensed Clinical Social Worker

## 2020-05-08 NOTE — Telephone Encounter (Signed)
LCSW sent text link for video session per schedule. When pt failed to sign on LCSW called pt. Call went directly to vm. LCSW left detailed message regarding purpose of call with call back number for pt to f/u as desired. Per chart review prior to appt, it appears pt may be currently hospitalized after intentional OD.

## 2020-05-08 NOTE — Plan of Care (Signed)
°  Problem: Clinical Measurements: °Goal: Ability to maintain clinical measurements within normal limits will improve °Outcome: Progressing °  °Problem: Activity: °Goal: Risk for activity intolerance will decrease °Outcome: Progressing °  °Problem: Nutrition: °Goal: Adequate nutrition will be maintained °Outcome: Progressing °  °

## 2020-05-08 NOTE — Progress Notes (Signed)
PROGRESS NOTE  Beckham Capistran TKZ:[id number] DOB: 09/14/1980 DOA: 05/03/2020 PCP: [name], MD  Brief History   40 year old male presents to ED on 1/19 after reported overdose. EMS reports that patient was on facebook live reporting he was going to kill himself. When EMS arrived to scene patient pill bottles including Seroquel, Flexeril, Oxycodone, Klonopin, Cymbalta were all empty, and patient was minimally responsive. Given 2 mg Narcan with little effect. On arrival to ED remained unresponsive after multiple doses of narcan and a narcan infusion requiring intubation. ETOH level 206. Dr. [name]  (EDP) completed IVC paperwork. CT head negative. Critical Care Consulted for admission  The patient has been transferred to the floor and TRH has assumed care of the patient.   Apparently early this morning the patient was requesting continuation of his home oxycodone which he takes at home. Dr. [name] commented that the patient has an overall overdose risk factor score of 650/999. He did give the patient 10 mg of oxycodone. When the patient asked for pain meds later this morning, I continued him on 5 mg oxycodone q6hr prn. This is 1/3 of what he is prescribed at home. The patient's provider has been notified of the patient's admission for suicide by overdose. The patient will need a referral to a pain management clinic when he is discharged to home.  Psychiatry has evaluated the patient and has stated that the patient is appropriate for admission to inpatient psychiatric treatment. TOC has been consulted to place the patient. He is IVC'd.  Pt continues to complain that gabapentin, neurontin, tylenol, and heating pad do nothing to help with his back pain. He is aware that his oxycodone will not be increased.   Consultants  . PCCM . Psychiatry  Procedures  . Mechanical ventilation/extubation  Antibiotics   Anti-infectives (From admission, onward)   None     Subjective  The patient is resting  comfortably. The patient is oppositional and continues state that he doesn't understand why he has to go to a psychiatric facility.  Objective   Vitals:  Vitals:   05/08/20 0338 05/08/20 1357  BP: 128/86 103/66  Pulse: 76 75  Resp:  20  Temp:  98.5 F (36.9 C)  SpO2: 96% 94%   Exam:  Constitutional:  . The patient is awake, alert, and oriented x 3. No acute distress. Respiratory:  . No increased work of breathing. . No wheezes, rales, or rhonchi . No tactile fremitus Cardiovascular:  . Regular rate and rhythm . No murmurs, ectopy, or gallups. . No lateral PMI. No thrills. Abdomen:  . Abdomen is soft, non-tender, non-distended . No hernias, masses, or organomegaly . Normoactive bowel sounds.  Musculoskeletal:  . No cyanosis, clubbing, or edema Skin:  . No rashes, lesions, ulcers . palpation of skin: no induration or nodules Neurologic:  . CN 2-12 intact . Sensation all 4 extremities intact Psychiatric:  . The patient's affect is flat and his mood is depressed.  I have personally reviewed the following:   Today's Data  . Orthoptist  . MRSA by PCR is negative  Imaging  . CT head  Scheduled Meds: . divalproex  500 mg Oral BID  . DULoxetine  60 mg Oral Daily  . feeding supplement  237 mL Oral BID BM  . gabapentin  600 mg Oral TID  . heparin  5,000 Units Subcutaneous Q8H  . naproxen  500 mg Oral BID WC  . pantoprazole  40 mg Oral QHS  Continuous Infusions: . dextrose 5% lactated ringers Stopped (05/04/20 1016)    Active Problems:   Overdose Chronic pain Bipolar disorder  LOS: 5 days   A & P  Intentional overdose in suicide attempt: The patient has a history of Bipolar disease and schizoaffective disorder. Psychiatry has evaluated the patient and has stated that the patient is appropriate for admission to inpatient psychiatric treatment. TOC has been consulted to place the patient. He is IVC'd and on suicide precautions. Awaiting placement in  inpatient psychiatric facility.   Compromised airway: The patient was extubated on 05/04/2020.  Chronic pain: The patient is on oxycodone 15 mg Q6h at home. This has been continued at 5 mg Q6 here. He is also receiving neurontin, todadol, tylenol and heating pad. He says that they do nothing for him. This is 1/3 of what he is prescribed at home. Dr. [name] commented that the patient has an overall overdose risk factor score of 650/999. The patient's provider has been notified of the patient's admission for suicide by overdose. The patient will need a referral to a pain management clinic when he is discharged to home.  Hx of Crohn's disease: Noted. Supportive care  I have seen and examined this patient myself. I have spent 25 minute in his evaluation and care.  DVT Prophylaxis: Heparin SubQ CODE STATUS: Full Code Family Communication: None available Disposition: Status is: Inpatient  Remains inpatient appropriate because:Unsafe d/c plan  Dispo: The patient is from: Home              Anticipated d/c is to: inpatient psychiatric facility              Anticipated d/c date is: 1 day              Patient currently is medically stable to d/c.   Difficult to place patient Yes   Chellsie Gomer, DO [facility] Direct contact: see [email or web address]  7PM-7AM contact night coverage as above 05/08/2020, 6:15 PM  LOS: 2 days

## 2020-05-08 NOTE — Progress Notes (Signed)
Pt asking who would be here to evaluate him and I told him that someone had already done that on 1/20.  Pt states he has no memory of that.  I contacted Sheran Fava, NP and she said she would be glad to see him again tomorrow if a psych eval was entered.  Contacted Dr. Benny Lennert to see if she would enter another eval so Sheran Fava could come see him.

## 2020-05-08 NOTE — Plan of Care (Signed)
  Problem: Education: Goal: Knowledge of General Education information will improve Description Including pain rating scale, medication(s)/side effects and non-pharmacologic comfort measures Outcome: Progressing   

## 2020-05-09 NOTE — Plan of Care (Signed)
?  Problem: Clinical Measurements: ?Goal: Will remain free from infection ?Outcome: Progressing ?  ?

## 2020-05-09 NOTE — Progress Notes (Signed)
PROGRESS NOTE  Juan Hester EVO:[id number] DOB: 08-05-1980 DOA: 05/03/2020 PCP: [name], MD  Brief History   40 year old male presents to ED on 1/19 after reported overdose. EMS reports that patient was on facebook live reporting he was going to kill himself. When EMS arrived to scene patient pill bottles including Seroquel, Flexeril, Oxycodone, Klonopin, Cymbalta were all empty, and patient was minimally responsive. Given 2 mg Narcan with little effect. On arrival to ED remained unresponsive after multiple doses of narcan and a narcan infusion requiring intubation. ETOH level 206. Dr. [name]  (EDP) completed IVC paperwork. CT head negative. Critical Care Consulted for admission  The patient has been transferred to the floor and [facility] has assumed care of the patient.   Apparently early this morning the patient was requesting continuation of his home oxycodone which he takes at home. Dr. [name] commented that the patient has an overall overdose risk factor score of 650/999. He did give the patient 10 mg of oxycodone. When the patient asked for pain meds later this morning, I continued him on 5 mg oxycodone q6hr prn. This is 1/3 of what he is prescribed at home. The patient's provider has been notified of the patient's admission for suicide by overdose. The patient will need a referral to a pain management clinic when he is discharged to home.  Psychiatry has evaluated the patient and has stated that the patient is appropriate for admission to inpatient psychiatric treatment. TOC has been consulted to place the patient. He is IVC'd.  Pt continues to complain that gabapentin, neurontin, tylenol, and heating pad do nothing to help with his back pain. He is aware that his oxycodone will not be increased.   As the patient is concerned that he does not recall meeting with psychiatry they have been asked to reconsult the patient.   Consultants  . PCCM . Psychiatry  Procedures  . Mechanical  ventilation/extubation  Antibiotics   Anti-infectives (From admission, onward)   None     Subjective  The patient is resting comfortably. No new complaints.  Objective   Vitals:  Vitals:   05/09/20 0358 05/09/20 1544  BP: 114/75 130/88  Pulse: 77 74  Resp: 17 18  Temp: 98.7 F (37.1 C) 97.6 F (36.4 C)  SpO2: 95% 100%   Exam:  Constitutional:  . The patient is awake, alert, and oriented x 3. No acute distress. Respiratory:  . No increased work of breathing. . No wheezes, rales, or rhonchi . No tactile fremitus Cardiovascular:  . Regular rate and rhythm . No murmurs, ectopy, or gallups. . No lateral PMI. No thrills. Abdomen:  . Abdomen is soft, non-tender, non-distended . No hernias, masses, or organomegaly . Normoactive bowel sounds.  Musculoskeletal:  . No cyanosis, clubbing, or edema Skin:  . No rashes, lesions, ulcers . palpation of skin: no induration or nodules Neurologic:  . CN 2-12 intact . Sensation all 4 extremities intact Psychiatric:  . The patient's affect is flat and his mood is depressed.  I have personally reviewed the following:   Today's Data  . Orthoptist  . MRSA by PCR is negative  Imaging  . CT head  Scheduled Meds: . divalproex  500 mg Oral BID  . DULoxetine  60 mg Oral Daily  . feeding supplement  237 mL Oral BID BM  . gabapentin  600 mg Oral TID  . heparin  5,000 Units Subcutaneous Q8H  . naproxen  500 mg Oral BID  WC  . pantoprazole  40 mg Oral QHS   Continuous Infusions:   Active Problems:   Overdose Chronic pain Bipolar disorder  LOS: 6 days   A & P  Intentional overdose in suicide attempt: The patient has a history of Bipolar disease and schizoaffective disorder. Psychiatry has evaluated the patient and has stated that the patient is appropriate for admission to inpatient psychiatric treatment. TOC has been consulted to place the patient. He is IVC'd and on suicide precautions. Awaiting placement in  inpatient psychiatric facility. Psychiatry has been consulted to come back and talk to the patient as he has complained that he does not feel heard.  Compromised airway: The patient was extubated on 05/04/2020.  Chronic pain: The patient is on oxycodone 15 mg Q6h at home. This has been continued at 5 mg Q6 here. He is also receiving neurontin, todadol, tylenol and heating pad. He says that they do nothing for him. This is 1/3 of what he is prescribed at home. Dr. [name] commented that the patient has an overall overdose risk factor score of 650/999. The patient's provider has been notified of the patient's admission for suicide by overdose. The patient will need a referral to a pain management clinic when he is discharged to home.  Hx of Crohn's disease: Noted. Supportive care  I have seen and examined this patient myself. I have spent 25 minute in his evaluation and care.  DVT Prophylaxis: Heparin SubQ CODE STATUS: Full Code Family Communication: None available Disposition: Status is: Inpatient  Remains inpatient appropriate because:Unsafe d/c plan  Dispo: The patient is from: Home              Anticipated d/c is to: inpatient psychiatric facility              Anticipated d/c date is: 1 day              Patient currently is medically stable to d/c.   Difficult to place patient Yes   Juan Helle, DO [facility] Direct contact: see [email or web address]  7PM-7AM contact night coverage as above 05/09/2020, 5:02 PM  LOS: 2 days

## 2020-05-10 ENCOUNTER — Encounter (HOSPITAL_COMMUNITY): Payer: Self-pay | Admitting: Pulmonary Disease

## 2020-05-10 ENCOUNTER — Other Ambulatory Visit: Payer: Self-pay

## 2020-05-10 ENCOUNTER — Encounter (HOSPITAL_COMMUNITY): Payer: Self-pay | Admitting: Family

## 2020-05-10 ENCOUNTER — Inpatient Hospital Stay (HOSPITAL_COMMUNITY)
Admission: AD | Admit: 2020-05-10 | Discharge: 2020-05-15 | DRG: 918 | Disposition: A | Discharge status: Home / Self Care | Payer: Federal, State, Local not specified - Other | Source: Intra-hospital | Attending: Psychiatry | Admitting: Psychiatry

## 2020-05-10 DIAGNOSIS — Z9049 Acquired absence of other specified parts of digestive tract: Secondary | ICD-10-CM

## 2020-05-10 DIAGNOSIS — G4700 Insomnia, unspecified: Secondary | ICD-10-CM | POA: Diagnosis present

## 2020-05-10 DIAGNOSIS — Z86711 Personal history of pulmonary embolism: Secondary | ICD-10-CM | POA: Diagnosis not present

## 2020-05-10 DIAGNOSIS — I252 Old myocardial infarction: Secondary | ICD-10-CM | POA: Diagnosis not present

## 2020-05-10 DIAGNOSIS — F411 Generalized anxiety disorder: Secondary | ICD-10-CM | POA: Diagnosis present

## 2020-05-10 DIAGNOSIS — T50902D Poisoning by unspecified drugs, medicaments and biological substances, intentional self-harm, subsequent encounter: Secondary | ICD-10-CM

## 2020-05-10 DIAGNOSIS — Z888 Allergy status to other drugs, medicaments and biological substances status: Secondary | ICD-10-CM

## 2020-05-10 DIAGNOSIS — K5090 Crohn's disease, unspecified, without complications: Secondary | ICD-10-CM | POA: Diagnosis present

## 2020-05-10 DIAGNOSIS — T424X2A Poisoning by benzodiazepines, intentional self-harm, initial encounter: Secondary | ICD-10-CM | POA: Diagnosis present

## 2020-05-10 DIAGNOSIS — F17210 Nicotine dependence, cigarettes, uncomplicated: Secondary | ICD-10-CM | POA: Diagnosis present

## 2020-05-10 DIAGNOSIS — Z881 Allergy status to other antibiotic agents status: Secondary | ICD-10-CM | POA: Diagnosis not present

## 2020-05-10 DIAGNOSIS — T43212A Poisoning by selective serotonin and norepinephrine reuptake inhibitors, intentional self-harm, initial encounter: Secondary | ICD-10-CM | POA: Diagnosis present

## 2020-05-10 DIAGNOSIS — F250 Schizoaffective disorder, bipolar type: Secondary | ICD-10-CM | POA: Diagnosis present

## 2020-05-10 DIAGNOSIS — Z85038 Personal history of other malignant neoplasm of large intestine: Secondary | ICD-10-CM

## 2020-05-10 DIAGNOSIS — M21371 Foot drop, right foot: Secondary | ICD-10-CM | POA: Diagnosis present

## 2020-05-10 DIAGNOSIS — T50902A Poisoning by unspecified drugs, medicaments and biological substances, intentional self-harm, initial encounter: Secondary | ICD-10-CM | POA: Diagnosis not present

## 2020-05-10 DIAGNOSIS — F4310 Post-traumatic stress disorder, unspecified: Secondary | ICD-10-CM | POA: Diagnosis present

## 2020-05-10 DIAGNOSIS — T481X2A Poisoning by skeletal muscle relaxants [neuromuscular blocking agents], intentional self-harm, initial encounter: Principal | ICD-10-CM | POA: Diagnosis present

## 2020-05-10 DIAGNOSIS — Z79899 Other long term (current) drug therapy: Secondary | ICD-10-CM | POA: Diagnosis not present

## 2020-05-10 DIAGNOSIS — Z85118 Personal history of other malignant neoplasm of bronchus and lung: Secondary | ICD-10-CM | POA: Diagnosis not present

## 2020-05-10 DIAGNOSIS — Z933 Colostomy status: Secondary | ICD-10-CM | POA: Diagnosis not present

## 2020-05-10 DIAGNOSIS — G8929 Other chronic pain: Secondary | ICD-10-CM | POA: Diagnosis present

## 2020-05-10 DIAGNOSIS — Z20822 Contact with and (suspected) exposure to covid-19: Secondary | ICD-10-CM | POA: Diagnosis present

## 2020-05-10 DIAGNOSIS — Z818 Family history of other mental and behavioral disorders: Secondary | ICD-10-CM

## 2020-05-10 DIAGNOSIS — Z882 Allergy status to sulfonamides status: Secondary | ICD-10-CM

## 2020-05-10 DIAGNOSIS — T402X2A Poisoning by other opioids, intentional self-harm, initial encounter: Secondary | ICD-10-CM | POA: Diagnosis present

## 2020-05-10 DIAGNOSIS — F314 Bipolar disorder, current episode depressed, severe, without psychotic features: Secondary | ICD-10-CM | POA: Diagnosis present

## 2020-05-10 MED ORDER — HYDROXYZINE HCL 10 MG PO TABS
10.0000 mg | ORAL_TABLET | Freq: Three times a day (TID) | ORAL | Status: DC | PRN
  Filled 2020-05-10: qty 1

## 2020-05-10 MED ORDER — TRAZODONE HCL 50 MG PO TABS: 50.0000 mg | ORAL_TABLET | Freq: Every evening | ORAL | Status: DC | PRN

## 2020-05-10 MED ORDER — DM-GUAIFENESIN ER 30-600 MG PO TB12: 1.0000 | ORAL_TABLET | Freq: Two times a day (BID) | ORAL | Status: DC | PRN

## 2020-05-10 MED ORDER — ENOXAPARIN SODIUM 60 MG/0.6ML ~~LOC~~ SOLN: 55.0000 mg | SUBCUTANEOUS | Status: DC

## 2020-05-10 MED ORDER — DULOXETINE HCL 60 MG PO CPEP
60.0000 mg | ORAL_CAPSULE | Freq: Every day | ORAL | Status: DC
  Administered 2020-05-11 – 2020-05-15 (×5): 60 mg via ORAL
  Filled 2020-05-10 (×5): qty 1
  Filled 2020-05-10: qty 7
  Filled 2020-05-10: qty 1

## 2020-05-10 MED ORDER — NAPROXEN 500 MG PO TABS
500.0000 mg | ORAL_TABLET | Freq: Two times a day (BID) | ORAL | Status: DC
  Administered 2020-05-10 – 2020-05-15 (×10): 500 mg via ORAL
  Filled 2020-05-10 (×13): qty 1

## 2020-05-10 MED ORDER — CLONAZEPAM 0.25 MG PO TBDP: 0.2500 mg | ORAL_TABLET | Freq: Two times a day (BID) | ORAL | 0 refills | Status: DC | PRN

## 2020-05-10 MED ORDER — OXYCODONE HCL 5 MG PO TABS: 5.0000 mg | ORAL_TABLET | Freq: Four times a day (QID) | ORAL | 0 refills | Status: DC | PRN

## 2020-05-10 MED ORDER — CYCLOBENZAPRINE HCL 10 MG PO TABS
10.0000 mg | ORAL_TABLET | Freq: Two times a day (BID) | ORAL | Status: DC | PRN
  Administered 2020-05-11 – 2020-05-14 (×6): 10 mg via ORAL
  Filled 2020-05-10 (×2): qty 1
  Filled 2020-05-10: qty 2
  Filled 2020-05-10 (×3): qty 1

## 2020-05-10 MED ORDER — ACETAMINOPHEN 325 MG PO TABS: 650.0000 mg | ORAL_TABLET | Freq: Four times a day (QID) | ORAL | Status: DC | PRN

## 2020-05-10 MED ORDER — OXYCODONE HCL 5 MG PO TABS
5.0000 mg | ORAL_TABLET | Freq: Four times a day (QID) | ORAL | Status: DC | PRN
  Administered 2020-05-10 – 2020-05-11 (×3): 5 mg via ORAL
  Filled 2020-05-10 (×3): qty 1

## 2020-05-10 MED ORDER — DIVALPROEX SODIUM 500 MG PO DR TAB
500.0000 mg | DELAYED_RELEASE_TABLET | Freq: Two times a day (BID) | ORAL | Status: DC
  Administered 2020-05-10 – 2020-05-14 (×8): 500 mg via ORAL
  Filled 2020-05-10 (×13): qty 1

## 2020-05-10 MED ORDER — CLONAZEPAM 0.125 MG PO TBDP
0.2500 mg | ORAL_TABLET | Freq: Two times a day (BID) | ORAL | Status: DC | PRN
  Administered 2020-05-11: 0.25 mg via ORAL
  Filled 2020-05-10: qty 2

## 2020-05-10 MED ORDER — GABAPENTIN 300 MG PO CAPS
600.0000 mg | ORAL_CAPSULE | Freq: Three times a day (TID) | ORAL | Status: DC
  Administered 2020-05-10 – 2020-05-15 (×14): 600 mg via ORAL
  Filled 2020-05-10: qty 42
  Filled 2020-05-10: qty 2
  Filled 2020-05-10: qty 42
  Filled 2020-05-10 (×4): qty 2
  Filled 2020-05-10: qty 42
  Filled 2020-05-10 (×14): qty 2

## 2020-05-10 MED ORDER — MAGNESIUM HYDROXIDE 400 MG/5ML PO SUSP: 30.0000 mL | Freq: Every day | ORAL | Status: DC | PRN

## 2020-05-10 MED ORDER — ALUM & MAG HYDROXIDE-SIMETH 200-200-20 MG/5ML PO SUSP: 30.0000 mL | ORAL | Status: DC | PRN

## 2020-05-10 MED ORDER — PANTOPRAZOLE SODIUM 40 MG PO TBEC
40.0000 mg | DELAYED_RELEASE_TABLET | Freq: Every day | ORAL | Status: DC
  Administered 2020-05-11 – 2020-05-14 (×4): 40 mg via ORAL
  Filled 2020-05-10 (×8): qty 1

## 2020-05-10 MED ORDER — GABAPENTIN 300 MG PO CAPS: 600.0000 mg | ORAL_CAPSULE | Freq: Three times a day (TID) | ORAL | Status: DC

## 2020-05-10 NOTE — Plan of Care (Signed)
  Problem: Pain Managment: Goal: General experience of comfort will improve Outcome: Not Progressing Note: Due to patient's attempted suicide his pain regimen has not been returned to his normal home regimen. Medications have been altered to try and achieve optimal pain relief. Patient reports 6-9/10 for pain level.   Problem: Elimination: Goal: Will not experience complications related to bowel motility Outcome: Completed/Met Goal: Will not experience complications related to urinary retention Outcome: Completed/Met

## 2020-05-10 NOTE — Progress Notes (Signed)
PROGRESS NOTE    Juan Hester  ZOX:[id number] DOB: [birth year] DOA: 05/03/2020 PCP: [name], MD   Brief Narrative:  40 year old came with history of bipolar, schizoaffective, Crohn's disease to the ED after overdose on Seroquel, Flexeril, oxycodone, Klonopin, Cymbalta which [place].  His alcohol level was elevated.  He was given multiple doses of Narcan and infusion requiring intubation.  IVC paperwork was filled out.  CT head was negative and patient was transferred to the ICU.Patient seen by psychiatry for inpatient psychiatry treatment.   Assessment & Plan:   Active Problems:   Overdose  Intentional overdose suicide attempt History of bipolar and schizoaffective disorder -History of bipolar and schizoaffective disorder.  Initially required intubation due to compromised airway.  Extubated on 05/04/2020. -Initially psychiatry recommended inpatient admission.  Patient was very drowsy at that time now is mentating better therefore will reconsult psych for further evaluation. -Patient is currently IVC -Depakote 5 mg twice daily, Cymbalta 60 mg daily  Chronic pain -Patient will eventually need referral to pain management. -Currently on gabapentin 600 mg 3 times daily, Klonopin 0.25 mg twice daily as needed, as needed Flexeril, hydroxyzine and oxycodone at lower doses  Crohn's disease -Supportive care  DVT prophylaxis: Subcu heparin Code Status: Full code Family Communication:    Status is: Inpatient  Remains inpatient appropriate because:Inpatient level of care appropriate due to severity of illness   Dispo: The patient is from: Home              Anticipated d/c is to: Home              Anticipated d/c date is: 1 day              Patient currently is not medically stable to d/c.  Patient needs psychiatry clearance thereafter good outpatient follow-up.  Hopefully discharge in the next day or 2   Difficult to place patient No       Body mass index is 33.89  kg/m.         Consultants:   Psychiatry    Subjective: Feels better this morning, denies any suicidal or homicidal ideation.  Patient states he had a lot of family and job stress building up over the last couple of weeks therefore he attempted suicide.  Previously it started in 2016 as well.  Is very minimal social support locally therefore gets stressed out easily.  He follows a psychologist outpatient every 3 months and was supposed to see them couple days after he was admitted.  Review of Systems Otherwise negative except as per HPI, including: General: Denies fever, chills, night sweats or unintended weight loss. Resp: Denies cough, wheezing, shortness of breath. Cardiac: Denies chest pain, palpitations, orthopnea, paroxysmal nocturnal dyspnea. GI: Denies abdominal pain, nausea, vomiting, diarrhea or constipation GU: Denies dysuria, frequency, hesitancy or incontinence MS: Denies muscle aches, joint pain or swelling Neuro: Denies headache, neurologic deficits (focal weakness, numbness, tingling), abnormal gait Psych: Denies anxiety, depression, SI/HI/AVH Skin: Denies new rashes or lesions ID: Denies sick contacts, exotic exposures, travel  Examination:  General exam: Appears calm and comfortable  Respiratory system: Clear to auscultation. Respiratory effort normal. Cardiovascular system: S1 & S2 heard, RRR. No JVD, murmurs, rubs, gallops or clicks. No pedal edema. Gastrointestinal system: Abdomen is nondistended, soft and nontender. No organomegaly or masses felt. Normal bowel sounds heard. Central nervous system: Alert and oriented. No focal neurological deficits. Extremities: Symmetric 5 x 5 power. Skin: No rashes, lesions or ulcers Psychiatry: Judgement and insight  appear normal. Mood & affect appropriate.     Objective: Vitals:   05/09/20 0358 05/09/20 1544 05/09/20 2141 05/10/20 0438  BP: 114/75 130/88 96/70 104/66  Pulse: 77 74 89 81  Resp: 17 18 18 18    Temp: 98.7 F (37.1 C) 97.6 F (36.4 C) 98.2 F (36.8 C) 98 F (36.7 C)  TempSrc: Oral Oral Oral Oral  SpO2: 95% 100% 100% 100%  Weight:    116.5 kg  Height:        Intake/Output Summary (Last 24 hours) at 05/10/2020 0756 Last data filed at 05/09/2020 0914 Gross per 24 hour  Intake 480 ml  Output --  Net 480 ml   Filed Weights   05/04/20 0500 05/07/20 0500 05/10/20 0438  Weight: 107.1 kg 115.1 kg 116.5 kg     Data Reviewed:   CBC: Recent Labs  Lab 05/03/20 1144 05/04/20 0415  WBC  --  8.3  HGB 12.9* 12.6*  HCT 38.0* 37.3*  MCV  --  100.3*  PLT  --  680*   Basic Metabolic Panel: Recent Labs  Lab 05/03/20 1144 05/03/20 1623 05/04/20 0415 05/05/20 0033 05/06/20 0029  NA 141  --  139 137 136  K 3.9  --  3.6 3.0* 4.0  CL  --   --  105 100 103  CO2  --   --  23 25 24   GLUCOSE  --   --  105* 109* 142*  BUN  --   --  10 11 15   CREATININE  --   --  1.04 1.00 1.23  CALCIUM  --   --  8.4* 8.1* 8.4*  MG  --  1.4* 1.3* 1.9  --   PHOS  --  3.5 2.0* 2.4*  --    GFR: Estimated Creatinine Clearance: 107.8 mL/min (by C-G formula based on SCr of 1.23 mg/dL). Liver Function Tests: No results for input(s): AST, ALT, ALKPHOS, BILITOT, PROT, ALBUMIN in the last 168 hours. No results for input(s): LIPASE, AMYLASE in the last 168 hours. No results for input(s): AMMONIA in the last 168 hours. Coagulation Profile: No results for input(s): INR, PROTIME in the last 168 hours. Cardiac Enzymes: No results for input(s): CKTOTAL, CKMB, CKMBINDEX, TROPONINI in the last 168 hours. BNP (last 3 results) No results for input(s): PROBNP in the last 8760 hours. HbA1C: No results for input(s): HGBA1C in the last 72 hours. CBG: Recent Labs  Lab 05/03/20 1918 05/03/20 2317 05/04/20 0007 05/04/20 0323 05/04/20 0739  GLUCAP 73 48* 70 92 105*   Lipid Profile: No results for input(s): CHOL, HDL, LDLCALC, TRIG, CHOLHDL, LDLDIRECT in the last 72 hours. Thyroid Function Tests: No  results for input(s): TSH, T4TOTAL, FREET4, T3FREE, THYROIDAB in the last 72 hours. Anemia Panel: No results for input(s): VITAMINB12, FOLATE, FERRITIN, TIBC, IRON, RETICCTPCT in the last 72 hours. Sepsis Labs: No results for input(s): PROCALCITON, LATICACIDVEN in the last 168 hours.  Recent Results (from the past 240 hour(s))  Resp Panel by RT-PCR (Flu A&B, Covid) Nasopharyngeal Swab     Status: None   Collection Time: 05/03/20  2:20 AM   Specimen: Nasopharyngeal Swab; Nasopharyngeal(NP) swabs in vial transport medium  Result Value Ref Range Status   SARS Coronavirus 2 by RT PCR NEGATIVE NEGATIVE Final    Comment: (NOTE) SARS-CoV-2 target nucleic acids are NOT DETECTED.  The SARS-CoV-2 RNA is generally detectable in upper respiratory specimens during the acute phase of infection. The lowest concentration of SARS-CoV-2 viral copies this assay  can detect is 138 copies/mL. A negative result does not preclude SARS-Cov-2 infection and should not be used as the sole basis for treatment or other patient management decisions. A negative result may occur with  improper specimen collection/handling, submission of specimen other than nasopharyngeal swab, presence of viral mutation(s) within the areas targeted by this assay, and inadequate number of viral copies(<138 copies/mL). A negative result must be combined with clinical observations, patient history, and epidemiological information. The expected result is Negative.  Fact Sheet for Patients:  EntrepreneurPulse.com.au  Fact Sheet for Healthcare Providers:  IncredibleEmployment.be  This test is no t yet approved or cleared by the [place] FDA and  has been authorized for detection and/or diagnosis of SARS-CoV-2 by FDA under an Emergency Use Authorization (EUA). This EUA will remain  in effect (meaning this test can be used) for the duration of the COVID-19 declaration under Section 564(b)(1) of  the Act, 21 U.S.C.section 360bbb-3(b)(1), unless the authorization is terminated  or revoked sooner.       Influenza A by PCR NEGATIVE NEGATIVE Final   Influenza B by PCR NEGATIVE NEGATIVE Final    Comment: (NOTE) The Xpert Xpress SARS-CoV-2/FLU/RSV plus assay is intended as an aid in the diagnosis of influenza from Nasopharyngeal swab specimens and should not be used as a sole basis for treatment. Nasal washings and aspirates are unacceptable for Xpert Xpress SARS-CoV-2/FLU/RSV testing.  Fact Sheet for Patients: EntrepreneurPulse.com.au  Fact Sheet for Healthcare Providers: IncredibleEmployment.be  This test is not yet approved or cleared by the [place] FDA and has been authorized for detection and/or diagnosis of SARS-CoV-2 by FDA under an Emergency Use Authorization (EUA). This EUA will remain in effect (meaning this test can be used) for the duration of the COVID-19 declaration under Section 564(b)(1) of the Act, 21 U.S.C. section 360bbb-3(b)(1), unless the authorization is terminated or revoked.  Performed at [facility], [place] [address], [place], [place] [address]   MRSA PCR Screening     Status: None   Collection Time: 05/03/20  8:17 PM   Specimen: Nasopharyngeal  Result Value Ref Range Status   MRSA by PCR NEGATIVE NEGATIVE Final    Comment:        The GeneXpert MRSA Assay (FDA approved for NASAL specimens only), is one component of a comprehensive MRSA colonization surveillance program. It is not intended to diagnose MRSA infection nor to guide or monitor treatment for MRSA infections. Performed at [facility], [place] [address], [place], [place] [address]          Radiology Studies: No results found.      Scheduled Meds: . divalproex  500 mg Oral BID  . DULoxetine  60 mg Oral Daily  . feeding supplement  237 mL Oral BID BM  . gabapentin  600 mg Oral TID  . heparin  5,000 Units Subcutaneous  Q8H  . naproxen  500 mg Oral BID WC  . pantoprazole  40 mg Oral QHS   Continuous Infusions:   LOS: 7 days   Time spent= 35 mins    Azion Centrella Arsenio Loader, MD [facility]  If 7PM-7AM, please contact night-coverage  05/10/2020, 7:56 AM

## 2020-05-10 NOTE — Progress Notes (Signed)
Pt accepted to [facility], bed 302-1  Sheran Fava, NP is the accepting provider.    Dr. Mallie Darting is the attending provider.    Call report to [phone]    Miranda @ [facility] notified.     Pt is scheduled to arrive at [facility] at [place], [place], [place] Worker II Disposition CSW [phone]

## 2020-05-10 NOTE — Discharge Summary (Signed)
Physician Discharge Summary  Covey Baller VZC:[id number] DOB: Nov 23, 1980 DOA: 05/03/2020  PCP: [name], MD  Admit date: 05/03/2020 Discharge date: 05/10/2020  Admitted From: Home Disposition: Inpatient psychiatry  Recommendations for Outpatient Follow-up:  1. Follow up with PCP in 1-2 weeks 2. Please obtain BMP/CBC in one week your next doctors visit.     Discharge Condition: Stable CODE STATUS: Full code Diet recommendation: Regular  Brief/Interim Summary: 40 year old came with history of bipolar, schizoaffective, Crohn's disease to the ED after overdose on Seroquel, Flexeril, oxycodone, Klonopin, Cymbalta which [place].  His alcohol level was elevated.  He was given multiple doses of Narcan and infusion requiring intubation.  IVC paperwork was filled out.  CT head was negative and patient was transferred to the ICU.Patient seen by psychiatry for inpatient psychiatry treatment.  Today patient is medically cleared to be transferred to inpatient psych.  Discussed with psychiatry team.  Appreciate their input.  For details of rest of the medical management, refer to progress note from earlier today  Body mass index is 33.89 kg/m.         Discharge Diagnoses:  Active Problems:   Overdose      Consultations:  Psychiatry  Subjective: No complaints.  Discharge Exam: Vitals:   05/10/20 0945 05/10/20 1040  BP: 96/77 106/67  Pulse: (P) 82 82  Resp: (!) (P) 21 18  Temp: 97.7 F (36.5 C) 97.8 F (36.6 C)  SpO2: 97%    Vitals:   05/09/20 2141 05/10/20 0438 05/10/20 0945 05/10/20 1040  BP: 96/70 104/66 96/77 106/67  Pulse: 89 81 (P) 82 82  Resp: 18 18 (!) (P) 21 18  Temp: 98.2 F (36.8 C) 98 F (36.7 C) 97.7 F (36.5 C) 97.8 F (36.6 C)  TempSrc: Oral Oral Oral Oral  SpO2: 100% 100% 97%   Weight:  116.5 kg    Height:        General: Pt is alert, awake, not in acute distress Cardiovascular: RRR, S1/S2 +, no rubs, no gallops Respiratory: CTA  bilaterally, no wheezing, no rhonchi Abdominal: Soft, NT, ND, bowel sounds + Extremities: no edema, no cyanosis  Discharge Instructions   Allergies as of 05/10/2020      Reactions   Azathioprine Other (See Comments)   Pt does not recall reaction    Ciprofloxacin Other (See Comments)   Pt does not recall reaction    Mesalamine Other (See Comments)   Pt does not recall reaction   Metronidazole Other (See Comments)   Pt does not recall reaction    Sulfa Antibiotics Other (See Comments)   Pt does not recall reaction    Barium Sulfate Rash   Barium-containing Compounds Rash      Medication List    STOP taking these medications   calamine lotion   cyclobenzaprine 10 MG tablet Commonly known as: FLEXERIL   hydrOXYzine 10 MG tablet Commonly known as: ATARAX/VISTARIL   naloxone 4 MG/0.1ML Liqd nasal spray kit Commonly known as: NARCAN   QUEtiapine 400 MG tablet Commonly known as: SEROQUEL   QUEtiapine 50 MG tablet Commonly known as: SEROQUEL   simethicone 80 MG chewable tablet Commonly known as: MYLICON     TAKE these medications   clonazePAM 0.25 MG disintegrating tablet Commonly known as: KLONOPIN Take 1 tablet (0.25 mg total) by mouth 2 (two) times daily as needed (anxiety). What changed:   when to take this  reasons to take this  Another medication with the same name was removed. Continue taking  this medication, and follow the directions you see here.   divalproex 500 MG DR tablet Commonly known as: DEPAKOTE Take 1 tablet (500 mg total) by mouth 2 (two) times daily.   DULoxetine 60 MG capsule Commonly known as: Cymbalta Take 1 capsule (60 mg total) by mouth daily.   gabapentin 300 MG capsule Commonly known as: NEURONTIN Take 2 capsules (600 mg total) by mouth 3 (three) times daily.   oxyCODONE 5 MG immediate release tablet Commonly known as: Oxy IR/ROXICODONE Take 1 tablet (5 mg total) by mouth every 6 (six) hours as needed for moderate pain or  severe pain. What changed:   medication strength  how much to take  reasons to take this       Follow-up Information    [name], MD. Schedule an appointment as soon as possible for a visit in 1 week(s).   Specialty: Pulmonary Disease Contact information: [address] [address] [phone]              Allergies  Allergen Reactions  . Azathioprine Other (See Comments)    Pt does not recall reaction    . Ciprofloxacin Other (See Comments)    Pt does not recall reaction    . Mesalamine Other (See Comments)    Pt does not recall reaction  . Metronidazole Other (See Comments)    Pt does not recall reaction   . Sulfa Antibiotics Other (See Comments)    Pt does not recall reaction   . Barium Sulfate Rash  . Barium-Containing Compounds Rash    You were cared for by a hospitalist during your hospital stay. If you have any questions about your discharge medications or the care you received while you were in the hospital after you are discharged, you can call the unit and asked to speak with the hospitalist on call if the hospitalist that took care of you is not available. Once you are discharged, your primary care physician will handle any further medical issues. Please note that no refills for any discharge medications will be authorized once you are discharged, as it is imperative that you return to your primary care physician (or establish a relationship with a primary care physician if you do not have one) for your aftercare needs so that they can reassess your need for medications and monitor your lab values.   Procedures/Studies: CT Head Wo Contrast  Result Date: 05/03/2020 CLINICAL DATA:  Mental status change with unknown cause. History of ethanol abuse and colon cancer. EXAM: CT HEAD WITHOUT CONTRAST TECHNIQUE: Contiguous axial images were obtained from the base of the skull through the vertex without intravenous contrast. COMPARISON:   07/20/2019 FINDINGS: Brain: No evidence of acute infarction, hemorrhage, hydrocephalus, extra-axial collection or mass lesion/mass effect. Vascular: No hyperdense vessel or unexpected calcification. Skull: Normal. Negative for fracture or focal lesion. Sinuses/Orbits: No acute finding. Nasopharyngeal fluid in the setting of intubation. IMPRESSION: Negative head CT. Electronically Signed   By: Monte Fantasia M.D.   On: 05/03/2020 05:59   DG Chest Port 1 View  Result Date: 05/03/2020 CLINICAL DATA:  ET/OG placement EXAM: PORTABLE CHEST 1 VIEW COMPARISON:  Radiograph 02/07/2020, 05/03/2020, CT 02/07/2020 FINDINGS: *High positioning of the endotracheal tube approximately 10.7 cm from the carina. Recommend advancing 6 cm to the mid trachea. *Transesophageal tube tip and side port distal to the GE junction, terminating below the margin of imaging. *Implantable left subclavian approach Port-A-Cath tip terminates at the superior cavoatrial junction. Persistently  low lung volumes with streaky basilar opacities favored to reflect atelectatic change with vascular crowding. No focal consolidative opacity is seen. Trace bilateral effusions. No pneumothorax. No acute osseous or soft tissue abnormality. IMPRESSION: 1. High positioning of the endotracheal tube approximately 10.7 cm from the carina. Recommend advancing 6 cm to the mid trachea. 2. Transesophageal tube tip and side port distal to the GE junction, terminating below the margin of imaging. 3. Persistently low volumes with likely atelectasis and trace effusions. Electronically Signed   By: Lovena Le M.D.   On: 05/03/2020 03:24   DG Chest Portable 1 View  Result Date: 05/03/2020 CLINICAL DATA:  Overdose EXAM: PORTABLE CHEST 1 VIEW February 07, 2020 FINDINGS: The heart size and mediastinal contours are within normal limits. A left-sided MediPort catheter seen the tip at superior cavoatrial junction. There appears to be a shallow degree of aeration with bibasilar  subsegmental atelectasis. For a small left pleural effusion is seen the visualized skeletal structures are unremarkable. IMPRESSION: Small left pleural effusion. Electronically Signed   By: Prudencio Pair M.D.   On: 05/03/2020 02:42      The results of significant diagnostics from this hospitalization (including imaging, microbiology, ancillary and laboratory) are listed below for reference.     Microbiology: Recent Results (from the past 240 hour(s))  Resp Panel by RT-PCR (Flu A&B, Covid) Nasopharyngeal Swab     Status: None   Collection Time: 05/03/20  2:20 AM   Specimen: Nasopharyngeal Swab; Nasopharyngeal(NP) swabs in vial transport medium  Result Value Ref Range Status   SARS Coronavirus 2 by RT PCR NEGATIVE NEGATIVE Final    Comment: (NOTE) SARS-CoV-2 target nucleic acids are NOT DETECTED.  The SARS-CoV-2 RNA is generally detectable in upper respiratory specimens during the acute phase of infection. The lowest concentration of SARS-CoV-2 viral copies this assay can detect is 138 copies/mL. A negative result does not preclude SARS-Cov-2 infection and should not be used as the sole basis for treatment or other patient management decisions. A negative result may occur with  improper specimen collection/handling, submission of specimen other than nasopharyngeal swab, presence of viral mutation(s) within the areas targeted by this assay, and inadequate number of viral copies(<138 copies/mL). A negative result must be combined with clinical observations, patient history, and epidemiological information. The expected result is Negative.  Fact Sheet for Patients:  EntrepreneurPulse.com.au  Fact Sheet for Healthcare Providers:  IncredibleEmployment.be  This test is no t yet approved or cleared by the [place] FDA and  has been authorized for detection and/or diagnosis of SARS-CoV-2 by FDA under an Emergency Use Authorization (EUA). This EUA  will remain  in effect (meaning this test can be used) for the duration of the COVID-19 declaration under Section 564(b)(1) of the Act, 21 U.S.C.section 360bbb-3(b)(1), unless the authorization is terminated  or revoked sooner.       Influenza A by PCR NEGATIVE NEGATIVE Final   Influenza B by PCR NEGATIVE NEGATIVE Final    Comment: (NOTE) The Xpert Xpress SARS-CoV-2/FLU/RSV plus assay is intended as an aid in the diagnosis of influenza from Nasopharyngeal swab specimens and should not be used as a sole basis for treatment. Nasal washings and aspirates are unacceptable for Xpert Xpress SARS-CoV-2/FLU/RSV testing.  Fact Sheet for Patients: EntrepreneurPulse.com.au  Fact Sheet for Healthcare Providers: IncredibleEmployment.be  This test is not yet approved or cleared by the [place] FDA and has been authorized for detection and/or diagnosis of SARS-CoV-2 by FDA under an Emergency Use Authorization (EUA).  This EUA will remain in effect (meaning this test can be used) for the duration of the COVID-19 declaration under Section 564(b)(1) of the Act, 21 U.S.C. section 360bbb-3(b)(1), unless the authorization is terminated or revoked.  Performed at [facility], [place] [address], [place], [place] [address]   MRSA PCR Screening     Status: None   Collection Time: 05/03/20  8:17 PM   Specimen: Nasopharyngeal  Result Value Ref Range Status   MRSA by PCR NEGATIVE NEGATIVE Final    Comment:        The GeneXpert MRSA Assay (FDA approved for NASAL specimens only), is one component of a comprehensive MRSA colonization surveillance program. It is not intended to diagnose MRSA infection nor to guide or monitor treatment for MRSA infections. Performed at [facility], [place] [address], [place], [place] [address]      Labs: BNP (last 3 results) Recent Labs    08/18/19 1153  BNP 70.0   Basic Metabolic Panel: Recent Labs  Lab  05/03/20 1623 05/04/20 0415 05/05/20 0033 05/06/20 0029  NA  --  139 137 136  K  --  3.6 3.0* 4.0  CL  --  105 100 103  CO2  --  _0 GLUCOSE  --  105* 109* 142*  BUN  --  _1 CREATININE  --  1.04 1.00 1.23  CALCIUM  --  8.4* 8.1* 8.4*  MG 1.4* 1.3* 1.9  --   PHOS 3.5 2.0* 2.4*  --    Liver Function Tests: No results for input(s): AST, ALT, ALKPHOS, BILITOT, PROT, ALBUMIN in the last 168 hours. No results for input(s): LIPASE, AMYLASE in the last 168 hours. No results for input(s): AMMONIA in the last 168 hours. CBC: Recent Labs  Lab 05/04/20 0415  WBC 8.3  HGB 12.6*  HCT 37.3*  MCV 100.3*  PLT 106*   Cardiac Enzymes: No results for input(s): CKTOTAL, CKMB, CKMBINDEX, TROPONINI in the last 168 hours. BNP: Invalid input(s): POCBNP CBG: Recent Labs  Lab 05/03/20 1918 05/03/20 2317 05/04/20 0007 05/04/20 0323 05/04/20 0739  GLUCAP 73 48* 70 92 105*   D-Dimer No results for input(s): DDIMER in the last 72 hours. Hgb A1c No results for input(s): HGBA1C in the last 72 hours. Lipid Profile No results for input(s): CHOL, HDL, LDLCALC, TRIG, CHOLHDL, LDLDIRECT in the last 72 hours. Thyroid function studies No results for input(s): TSH, T4TOTAL, T3FREE, THYROIDAB in the last 72 hours.  Invalid input(s): FREET3 Anemia work up No results for input(s): VITAMINB12, FOLATE, FERRITIN, TIBC, IRON, RETICCTPCT in the last 72 hours. Urinalysis    Component Value Date/Time   COLORURINE YELLOW 02/07/2020 [place] 02/07/2020 1055   APPEARANCEUR Clear 10/26/2019 1511   LABSPEC >1.046 (H) 02/07/2020 1055   PHURINE 6.0 02/07/2020 1055   GLUCOSEU NEGATIVE 02/07/2020 1055   HGBUR NEGATIVE 02/07/2020 [place] 02/07/2020 1055   BILIRUBINUR Negative 10/26/2019 [place] 02/07/2020 1055   PROTEINUR NEGATIVE 02/07/2020 1055   NITRITE NEGATIVE 02/07/2020 1055   LEUKOCYTESUR NEGATIVE 02/07/2020 1055   Sepsis  Labs Invalid input(s): PROCALCITONIN,  WBC,  LACTICIDVEN Microbiology Recent Results (from the past 240 hour(s))  Resp Panel by RT-PCR (Flu A&B, Covid) Nasopharyngeal Swab     Status: None   Collection Time: 05/03/20  2:20 AM   Specimen: Nasopharyngeal Swab; Nasopharyngeal(NP) swabs in vial transport medium  Result Value Ref Range Status  SARS Coronavirus 2 by RT PCR NEGATIVE NEGATIVE Final    Comment: (NOTE) SARS-CoV-2 target nucleic acids are NOT DETECTED.  The SARS-CoV-2 RNA is generally detectable in upper respiratory specimens during the acute phase of infection. The lowest concentration of SARS-CoV-2 viral copies this assay can detect is 138 copies/mL. A negative result does not preclude SARS-Cov-2 infection and should not be used as the sole basis for treatment or other patient management decisions. A negative result may occur with  improper specimen collection/handling, submission of specimen other than nasopharyngeal swab, presence of viral mutation(s) within the areas targeted by this assay, and inadequate number of viral copies(<138 copies/mL). A negative result must be combined with clinical observations, patient history, and epidemiological information. The expected result is Negative.  Fact Sheet for Patients:  EntrepreneurPulse.com.au  Fact Sheet for Healthcare Providers:  IncredibleEmployment.be  This test is no t yet approved or cleared by the [place] FDA and  has been authorized for detection and/or diagnosis of SARS-CoV-2 by FDA under an Emergency Use Authorization (EUA). This EUA will remain  in effect (meaning this test can be used) for the duration of the COVID-19 declaration under Section 564(b)(1) of the Act, 21 U.S.C.section 360bbb-3(b)(1), unless the authorization is terminated  or revoked sooner.       Influenza A by PCR NEGATIVE NEGATIVE Final   Influenza B by PCR NEGATIVE NEGATIVE Final    Comment:  (NOTE) The Xpert Xpress SARS-CoV-2/FLU/RSV plus assay is intended as an aid in the diagnosis of influenza from Nasopharyngeal swab specimens and should not be used as a sole basis for treatment. Nasal washings and aspirates are unacceptable for Xpert Xpress SARS-CoV-2/FLU/RSV testing.  Fact Sheet for Patients: EntrepreneurPulse.com.au  Fact Sheet for Healthcare Providers: IncredibleEmployment.be  This test is not yet approved or cleared by the [place] FDA and has been authorized for detection and/or diagnosis of SARS-CoV-2 by FDA under an Emergency Use Authorization (EUA). This EUA will remain in effect (meaning this test can be used) for the duration of the COVID-19 declaration under Section 564(b)(1) of the Act, 21 U.S.C. section 360bbb-3(b)(1), unless the authorization is terminated or revoked.  Performed at [facility], [place] [address], [place], [place] [address]   MRSA PCR Screening     Status: None   Collection Time: 05/03/20  8:17 PM   Specimen: Nasopharyngeal  Result Value Ref Range Status   MRSA by PCR NEGATIVE NEGATIVE Final    Comment:        The GeneXpert MRSA Assay (FDA approved for NASAL specimens only), is one component of a comprehensive MRSA colonization surveillance program. It is not intended to diagnose MRSA infection nor to guide or monitor treatment for MRSA infections. Performed at [facility], [place] [address] [place], [place] [address]      Time coordinating discharge:  I have spent 35 minutes face to face with the patient and on the ward discussing the patients care, assessment, plan and disposition with other care givers. >50% of the time was devoted counseling the patient about the risks and benefits of treatment/Discharge disposition and coordinating care.   SIGNED:   Damita Lack, MD  [facility] 05/10/2020, 2:07 PM   If 7PM-7AM, please contact night-coverage

## 2020-05-10 NOTE — Progress Notes (Signed)
Patient has been accepted to [facility] room 302-1.  Patient may arrive at 2:30 PM.  Attending is Myles Lipps, MD. Accepting provider is Sheran Fava, NP. Please call report to [phone].

## 2020-05-10 NOTE — Progress Notes (Signed)
Juan Hester is a 40 year old male being admitted involuntarily to 306-1 from [facility] med floor.  He was medically admitted after intentional OD on home medications which required intubation.  Home medications were Seroquel, Flexaril, Oxycodone, Klonopin and Cymbalta.    He has medical history of Colon cancer, Crohns disease, heart attack and pulmonary embolism.  He currently has an ostomy and is able to take care of this himself.  He has psychiatric history of Bipolar disorder, PTSD, anxiety and depression.   During [facility] admission, he was irritable but cooperative.  He stated that he was under the impression that he would be discharged to home and not the psychiatric unit.  He currently denied SI/HI or A/V hallucinations.  However, he did state that he was upset that he didn't die with his recent attempt.  He reported ongoing medical issues, inability to work, no insurance and being estranged from his children are his stressors.  He is currently filing for disability and has a Chief Executive Officer but hasn't heard anything about a hearing.  He reports that he uses a cane due to ruptured disc which causes pain radiating down to right foot.  Oriented him to the unit.  Admission paperwork completed and signed.  Belongings searched and secured in locker # 08.  Skin assessment completed and noted acne on back, rash on L/R top of foot and ostomy on left abdomen.  No contraband found.  Q 15 minute checks initiated for safety.  We will continue to monitor the progress towards his goals.

## 2020-05-10 NOTE — TOC Transition Note (Signed)
Transition of Care [facility]) - CM/SW Discharge Note   Patient Details  Name: Brenner Visconti MRN: [id number] Date of Birth: [birth year]  [facility] CM/SW Contact:  Emeterio Reeve, [place] Phone Number: 05/10/2020, 2:59 PM   Clinical Narrative:     Pt will DC to [facility] via [employer]. PTs IVC is still active. Pt has been notified.   Final next level of care: Psychiatric Hospital Barriers to Discharge: Barriers Resolved   Patient Goals and CMS Choice        Discharge Placement              Patient chooses bed at: Other - please specify in the comment section below: [facility]) Patient to be transferred to facility by: [employer] Name of family member notified: pt notified Patient and family notified of of transfer: 05/10/20  Discharge Plan and Services                                     Social Determinants of Health (SDOH) Interventions     Readmission Risk Interventions No flowsheet data found.   Emeterio Reeve, Latanya Presser, [place] Social Worker [phone]

## 2020-05-10 NOTE — Progress Notes (Signed)
Psychoeducational Group Note  Date:  05/10/2020 Time:  2251  Group Topic/Focus:  Wrap-Up Group:   The focus of this group is to help patients review their daily goal of treatment and discuss progress on daily workbooks.  Participation Level: Did Not Attend  Participation Quality:  Not Applicable  Affect:  Not Applicable  Cognitive:  Not Applicable  Insight:  Not Applicable  Engagement in Group: Not Applicable  Additional Comments:  The patient did not attend group this evening since he was asleep at that time.   Archie Balboa S 05/10/2020, 10:51 PM

## 2020-05-10 NOTE — Tx Team (Signed)
Initial Treatment Plan 05/10/2020 6:07 PM Zell Hylton [id number]    PATIENT STRESSORS: Financial difficulties Health problems Marital or family conflict Occupational concerns   PATIENT STRENGTHS: Capable of independent living Communication skills General fund of knowledge   PATIENT IDENTIFIED PROBLEMS: Depression  Suicidal ideation/attempt  Chronic pain    "I need health insurance"  "Need a job, but no one will hire me due to my medical issues"           DISCHARGE CRITERIA:  Improved stabilization in mood, thinking, and/or behavior Need for constant or close observation no longer present Reduction of life-threatening or endangering symptoms to within safe limits Verbal commitment to aftercare and medication compliance  PRELIMINARY DISCHARGE PLAN: Outpatient therapy Medication management  PATIENT/FAMILY INVOLVEMENT: This treatment plan has been presented to and reviewed with the patient, Juan Hester.  The patient and family have been given the opportunity to ask questions and make suggestions.  Windell Moment, RN 05/10/2020, 6:07 PM

## 2020-05-10 NOTE — Progress Notes (Signed)
Juan Hester to be D/C'd per MD order. Discussed with the patient and all questions fully answered. ? VSS, Skin clean, dry and intact without evidence of skin break down, no evidence of skin tears noted. ? An After Visit Summary was printed and placed in the discharge packet. Report called to receiving facility.  ? Patient to be escorted via WC, and D/C to [facility] with [facility] under IVC orders.

## 2020-05-10 NOTE — Progress Notes (Signed)
   05/10/20 1040  Assess: MEWS Score  Temp 97.8 F (36.6 C)  BP 106/67  Pulse Rate 82  Resp 18  Assess: MEWS Score  MEWS Temp 0  MEWS Systolic 0  MEWS Pulse 0  MEWS RR 0  MEWS LOC 0  MEWS Score 0  MEWS Score Color Green  Assess: if the MEWS score is Yellow or Red  Were vital signs taken at a resting state? Yes  Focused Assessment No change from prior assessment  Early Detection of Sepsis Score *See Row Information* Low  MEWS guidelines implemented *See Row Information* No, vital signs rechecked   Patient VS scored a Yellow MEWS r/t SBP and RR. VS rechecked and patient returned to green MEWS. Will continue to monitor.

## 2020-05-10 NOTE — Consult Note (Signed)
Patient continues to meet inpatient criteria secondary to intentional overdose.  Patient has been accepted to [facility] behavioral health hospital room 302-1.  Patient may arrive after 2:30 PM.  Accepting physician Dr. Myles Lipps.

## 2020-05-11 DIAGNOSIS — F314 Bipolar disorder, current episode depressed, severe, without psychotic features: Secondary | ICD-10-CM

## 2020-05-11 MED ORDER — QUETIAPINE FUMARATE 200 MG PO TABS
200.0000 mg | ORAL_TABLET | Freq: Every day | ORAL | Status: DC
  Administered 2020-05-11: 200 mg via ORAL
  Filled 2020-05-11 (×2): qty 1

## 2020-05-11 MED ORDER — GUAIFENESIN ER 600 MG PO TB12: 600.0000 mg | ORAL_TABLET | Freq: Two times a day (BID) | ORAL | Status: DC | PRN

## 2020-05-11 MED ORDER — OXYCODONE HCL 5 MG PO TABS
15.0000 mg | ORAL_TABLET | Freq: Four times a day (QID) | ORAL | Status: DC | PRN
  Administered 2020-05-11 – 2020-05-15 (×14): 15 mg via ORAL
  Filled 2020-05-11 (×14): qty 3

## 2020-05-11 MED ORDER — HYDROXYZINE HCL 25 MG PO TABS
25.0000 mg | ORAL_TABLET | Freq: Three times a day (TID) | ORAL | Status: DC | PRN
  Administered 2020-05-12 – 2020-05-14 (×7): 25 mg via ORAL
  Filled 2020-05-11 (×8): qty 1
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
## 2020-05-11 NOTE — [facility] Counselor (Signed)
CSW faxed application for ACTT team to [facility] [phone] in [place] to attempt to get patient started with an ACTT team without insurance.  CSW will follow up with this agency for possible placement.

## 2020-05-11 NOTE — Progress Notes (Signed)
OT Cancellation Note  Patient Details Name: Juan Hester MRN: [id number] DOB: [birth year]   Cancelled Treatment:    Reason Eval/Treat Not Completed: OT screened, no needs identified, will sign off  Pt is a 40 y/o male with PMHx of colon cancer with colostomy, Crohn's disease, pulmonary embolism, chronic fatigue, bipolar disorder, depression, and anxiety who was referred to OT as an inpatient admission at [facility] to evaluate patient for ambulation and use of a cane while on the unit. Upon approach, pt is present in room with single straight cane at bedside. Pt reports use of straight cane at home and denies any needs or concerns. Pt able to ambulate safely and independently with use of straight cane. OT screened, no needs identified, will sign off.    Juan Hester 05/11/2020, 2:36 PM
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
## 2020-05-11 NOTE — Progress Notes (Signed)
   05/11/20 2000  Psych Admission Type (Psych Patients Only)  Admission Status Involuntary  Psychosocial Assessment  Patient Complaints Depression;Anxiety  Eye Contact Fair  Facial Expression Sad  Affect Sad  Speech Logical/coherent  Interaction Assertive  Motor Activity Slow  Appearance/Hygiene Unremarkable  Behavior Characteristics Cooperative;Appropriate to situation  Mood Depressed;Anxious  Thought Process  Coherency WDL  Content WDL  Delusions None reported or observed  Perception WDL  Hallucination None reported or observed  Judgment Impaired  Confusion WDL  Danger to Self  Current suicidal ideation? Denies  Danger to Others  Danger to Others None reported or observed

## 2020-05-11 NOTE — Progress Notes (Signed)
Pt has been sleep much of the evening, no interaction on the unit    05/11/20 0100  Psych Admission Type (Psych Patients Only)  Admission Status Involuntary  Psychosocial Assessment  Patient Complaints Irritability;Depression  Eye Contact Fair  Facial Expression Sad  Affect Sad  Speech Logical/coherent  Interaction Assertive  Motor Activity Other (Comment) (wdl)  Appearance/Hygiene In scrubs  Behavior Characteristics Cooperative;Irritable  Mood Irritable  Thought Process  Coherency WDL  Content WDL  Delusions WDL;None reported or observed  Perception WDL  Hallucination None reported or observed  Judgment Impaired  Confusion WDL  Danger to Self  Current suicidal ideation? Denies  Danger to Others  Danger to Others None reported or observed

## 2020-05-11 NOTE — Progress Notes (Signed)
Pt up in the middle of the night and received PRN Oxycodone per MAR at  0327. Pt talked to Probation officer about situation, pt has good insight into Tx and situation , pt seems to have some roadblocks to what he is trying to accomplish. Pt was encouraged to talk to SW and the doctor about some of his issues.
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
## 2020-05-11 NOTE — [facility] Suicide Risk Assessment (Signed)
[facility] INPATIENT:  Family/Significant Other Suicide Prevention Education  Suicide Prevention Education:  Patient Refusal for Family/Significant Other Suicide Prevention Education: The patient Juan Hester has refused to provide written consent for family/significant other to be provided Family/Significant Other Suicide Prevention Education during admission and/or prior to discharge.  Physician notified.  CSW completed SPE with Juan Hester.  A pamphlet was placed in the patient's chart for review upon discharge.   Juan Hester 05/11/2020, 11:39 AM

## 2020-05-11 NOTE — Progress Notes (Signed)
°   05/11/20 1700  Psych Admission Type (Psych Patients Only)  Admission Status Involuntary  Psychosocial Assessment  Patient Complaints Depression  Eye Contact Fair  Facial Expression Sad  Affect Sad  Speech Logical/coherent  Interaction Assertive  Motor Activity Slow  Appearance/Hygiene Unremarkable  Behavior Characteristics Cooperative;Appropriate to situation;Irritable  Thought Process  Coherency WDL  Content WDL  Delusions None reported or observed  Perception WDL  Hallucination None reported or observed  Judgment Impaired  Confusion WDL  Danger to Self  Current suicidal ideation? Denies  Danger to Others  Danger to Others None reported or observed

## 2020-05-12 DIAGNOSIS — T50902A Poisoning by unspecified drugs, medicaments and biological substances, intentional self-harm, initial encounter: Secondary | ICD-10-CM

## 2020-05-12 LAB — LIPID PANEL
Cholesterol: 123 mg/dL (ref 0–200)
HDL: 29 mg/dL — ABNORMAL LOW (ref >40)
LDL Cholesterol: 55 mg/dL (ref 0–99)
Total CHOL/HDL Ratio: 4.2 RATIO
Triglycerides: 193 mg/dL — ABNORMAL HIGH (ref <150)
VLDL: 39 mg/dL (ref 0–40)

## 2020-05-12 LAB — HEMOGLOBIN A1C
Hgb A1c MFr Bld: 5 % (ref 4.8–5.6)
Mean Plasma Glucose: 96.8 mg/dL

## 2020-05-12 MED ORDER — CLOTRIMAZOLE 1 % EX CREA
TOPICAL_CREAM | Freq: Two times a day (BID) | CUTANEOUS | Status: DC
  Filled 2020-05-12: qty 15

## 2020-05-12 MED ORDER — CLOTRIMAZOLE 1 % EX SOLN
Freq: Two times a day (BID) | CUTANEOUS | Status: DC
  Filled 2020-05-12: qty 10

## 2020-05-12 MED ORDER — QUETIAPINE FUMARATE 50 MG PO TABS
50.0000 mg | ORAL_TABLET | Freq: Every day | ORAL | Status: DC
  Administered 2020-05-12 – 2020-05-15 (×4): 50 mg via ORAL
  Filled 2020-05-12 (×4): qty 1
  Filled 2020-05-12: qty 7
  Filled 2020-05-12 (×2): qty 1

## 2020-05-12 MED ORDER — QUETIAPINE FUMARATE 300 MG PO TABS
300.0000 mg | ORAL_TABLET | Freq: Every day | ORAL | Status: DC
Start: 2020-05-12 — End: 2020-05-15
  Administered 2020-05-12 – 2020-05-14 (×3): 300 mg via ORAL
  Filled 2020-05-12 (×2): qty 1
  Filled 2020-05-12: qty 7
  Filled 2020-05-12 (×3): qty 1

## 2020-05-12 NOTE — Progress Notes (Signed)
Patient attended the evening A.A.meeting and was appropriate.
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
## 2020-05-12 NOTE — Progress Notes (Signed)
Pt at nurse's station c/o pain in back and foot. Pt asking for oxycodone. Pt given Oxycodone 15 mg per [facility] as well as Vistaril 25 mg for itching in his foot.

## 2020-05-12 NOTE — [facility] Group Notes (Signed)
Adult Psychoeducational Group Note  Date:  05/12/2020 Time:  11:29 AM  Group Topic/Focus:  Goals Group:   The focus of this group is to help patients establish daily goals to achieve during treatment and discuss how the patient can incorporate goal setting into their daily lives to aide in recovery.  Participation Level:  Active  Participation Quality:  Appropriate  Affect:  Anxious and Appropriate  Cognitive:  Alert and Appropriate  Insight: Good  Engagement in Group:  Engaged  Modes of Intervention:  Discussion  Additional Comments:  Pt   Juan Hester 05/12/2020, 11:29 AM

## 2020-05-12 NOTE — Progress Notes (Signed)
[facility] Group Notes:  (Nursing/MHT/Case Management/Adjunct)  Date:  05/11/20  Time:  2015  Type of Therapy:  wrap up group  Participation Level:  Active  Participation Quality:  Attentive, Monopolizing, Redirectable and Sharing  Affect:  Appropriate  Cognitive:  Alert  Insight:  Improving  Engagement in Group:  Engaged  Modes of Intervention:  Clarification, Education and Support  Summary of Progress/Problems: Positive thinking and positive change were discussed.   Winfield Rast S 05/12/2020, 4:30 AM

## 2020-05-12 NOTE — Tx Team (Signed)
Interdisciplinary Treatment and Diagnostic Plan Update  05/12/2020 Time of Session: 9:25am Juan Hester MRN: [id number]  Principal Diagnosis: Suicide attempt by drug overdose [name])  Secondary Diagnoses: Principal Problem:   Suicide attempt by drug overdose [name]) Active Problems:   Bipolar I disorder with depression, severe ([name])   Current Medications:  Current Facility-Administered Medications  Medication Dose Route Frequency Provider Last Rate Last Admin  . acetaminophen (TYLENOL) tablet 650 mg  650 mg Oral Q6H PRN Suella Broad, FNP      . alum & mag hydroxide-simeth (MAALOX/MYLANTA) 200-200-20 MG/5ML suspension 30 mL  30 mL Oral Q4H PRN Starkes-Perry, Gayland Curry, FNP      . cyclobenzaprine (FLEXERIL) tablet 10 mg  10 mg Oral BID PRN Suella Broad, FNP   10 mg at 05/11/20 2205  . divalproex (DEPAKOTE) DR tablet 500 mg  500 mg Oral BID Suella Broad, FNP   500 mg at 05/12/20 0914  . DULoxetine (CYMBALTA) DR capsule 60 mg  60 mg Oral Daily Suella Broad, FNP   60 mg at 05/12/20 0914  . gabapentin (NEURONTIN) capsule 600 mg  600 mg Oral TID Suella Broad, FNP   600 mg at 05/12/20 0914  . guaiFENesin (MUCINEX) 12 hr tablet 600 mg  600 mg Oral BID PRN [name], [name], MD      . hydrOXYzine (ATARAX/VISTARIL) tablet 25 mg  25 mg Oral TID PRN [name], MD   25 mg at 05/12/20 0412  . magnesium hydroxide (MILK OF MAGNESIA) suspension 30 mL  30 mL Oral Daily PRN Suella Broad, FNP      . naproxen (NAPROSYN) tablet 500 mg  500 mg Oral BID WC Suella Broad, FNP   500 mg at 05/12/20 0914  . oxyCODONE (Oxy IR/ROXICODONE) immediate release tablet 15 mg  15 mg Oral Q6H PRN [name], [name], MD   15 mg at 05/12/20 1009  . pantoprazole (PROTONIX) EC tablet 40 mg  40 mg Oral QHS Suella Broad, FNP   40 mg at 05/11/20 2205  . QUEtiapine (SEROQUEL) tablet 300 mg  300 mg Oral QHS Pashayan, Redgie Grayer, MD      . QUEtiapine  (SEROQUEL) tablet 50 mg  50 mg Oral Q0600 [name], MD       PTA Medications: Medications Prior to Admission  Medication Sig Dispense Refill Last Dose  . clonazePAM (KLONOPIN) 0.25 MG disintegrating tablet Take 1 tablet (0.25 mg total) by mouth 2 (two) times daily as needed (anxiety). 60 tablet 0   . divalproex (DEPAKOTE) 500 MG DR tablet Take 1 tablet (500 mg total) by mouth 2 (two) times daily. 120 tablet 2   . DULoxetine (CYMBALTA) 60 MG capsule Take 1 capsule (60 mg total) by mouth daily. 60 capsule 2   . gabapentin (NEURONTIN) 300 MG capsule Take 2 capsules (600 mg total) by mouth 3 (three) times daily.     Marland Kitchen oxyCODONE (OXY IR/ROXICODONE) 5 MG immediate release tablet Take 1 tablet (5 mg total) by mouth every 6 (six) hours as needed for moderate pain or severe pain. 30 tablet 0     Patient Stressors: Financial difficulties Health problems Marital or family conflict Occupational concerns  Patient Strengths: Capable of independent living Curator fund of knowledge  Treatment Modalities: Medication Management, Group therapy, Case management,  1 to 1 session with clinician, Psychoeducation, Recreational therapy.   Physician Treatment Plan for Primary Diagnosis: Suicide attempt by drug overdose ([place]) Long  Term Goal(s): Improvement in symptoms so as ready for discharge Improvement in symptoms so as ready for discharge   Short Term Goals: Ability to identify changes in lifestyle to reduce recurrence of condition will improve Ability to verbalize feelings will improve Ability to disclose and discuss suicidal ideas Ability to demonstrate self-control will improve Ability to identify and develop effective coping behaviors will improve Ability to maintain clinical measurements within normal limits will improve Compliance with prescribed medications will improve Ability to identify triggers associated with substance abuse/mental health issues will  improve Ability to identify changes in lifestyle to reduce recurrence of condition will improve Ability to verbalize feelings will improve Ability to disclose and discuss suicidal ideas Ability to demonstrate self-control will improve Ability to identify and develop effective coping behaviors will improve Ability to maintain clinical measurements within normal limits will improve Compliance with prescribed medications will improve Ability to identify triggers associated with substance abuse/mental health issues will improve  Medication Management: Evaluate patient's response, side effects, and tolerance of medication regimen.  Therapeutic Interventions: 1 to 1 sessions, Unit Group sessions and Medication administration.  Evaluation of Outcomes: Progressing  Physician Treatment Plan for Secondary Diagnosis: Principal Problem:   Suicide attempt by drug overdose ([place]) Active Problems:   Bipolar I disorder with depression, severe ([place])  Long Term Goal(s): Improvement in symptoms so as ready for discharge Improvement in symptoms so as ready for discharge   Short Term Goals: Ability to identify changes in lifestyle to reduce recurrence of condition will improve Ability to verbalize feelings will improve Ability to disclose and discuss suicidal ideas Ability to demonstrate self-control will improve Ability to identify and develop effective coping behaviors will improve Ability to maintain clinical measurements within normal limits will improve Compliance with prescribed medications will improve Ability to identify triggers associated with substance abuse/mental health issues will improve Ability to identify changes in lifestyle to reduce recurrence of condition will improve Ability to verbalize feelings will improve Ability to disclose and discuss suicidal ideas Ability to demonstrate self-control will improve Ability to identify and develop effective coping behaviors will  improve Ability to maintain clinical measurements within normal limits will improve Compliance with prescribed medications will improve Ability to identify triggers associated with substance abuse/mental health issues will improve     Medication Management: Evaluate patient's response, side effects, and tolerance of medication regimen.  Therapeutic Interventions: 1 to 1 sessions, Unit Group sessions and Medication administration.  Evaluation of Outcomes: Progressing   RN Treatment Plan for Primary Diagnosis: Suicide attempt by drug overdose ([place]) Long Term Goal(s): Knowledge of disease and therapeutic regimen to maintain health will improve  Short Term Goals: Ability to remain free from injury will improve, Ability to verbalize frustration and anger appropriately will improve and Ability to demonstrate self-control  Medication Management: RN will administer medications as ordered by provider, will assess and evaluate patient's response and provide education to patient for prescribed medication. RN will report any adverse and/or side effects to prescribing provider.  Therapeutic Interventions: 1 on 1 counseling sessions, Psychoeducation, Medication administration, Evaluate responses to treatment, Monitor vital signs and CBGs as ordered, Perform/monitor CIWA, COWS, AIMS and Fall Risk screenings as ordered, Perform wound care treatments as ordered.  Evaluation of Outcomes: Progressing   LCSW Treatment Plan for Primary Diagnosis: Suicide attempt by drug overdose [facility]) Long Term Goal(s): Safe transition to appropriate next level of care at discharge, Engage patient in therapeutic group addressing interpersonal concerns.  Short Term Goals: Engage patient in aftercare planning  with referrals and resources, Increase social support and Increase ability to appropriately verbalize feelings  Therapeutic Interventions: Assess for all discharge needs, 1 to 1 time with Social worker, Explore available  resources and support systems, Assess for adequacy in community support network, Educate family and significant other(s) on suicide prevention, Complete Psychosocial Assessment, Interpersonal group therapy.  Evaluation of Outcomes: Progressing   Progress in Treatment: Attending groups: No. and As evidenced by:  Pt was recently admitted Participating in groups: No. and As evidenced by:  Pt was recently admitted Taking medication as prescribed: Yes. Toleration medication: Yes. Family/Significant other contact made: No, will contact:  pt declined consents Patient understands diagnosis: Yes. and No. Discussing patient identified problems/goals with staff: Yes. Medical problems stabilized or resolved: Yes. Denies suicidal/homicidal ideation: Yes. Issues/concerns per patient self-inventory: Yes. Other: None  New problem(s) identified: No, Describe:  CSW will continue to asses  New Short Term/Long Term Goal(s):medication stabilization, elimination of SI thoughts, development of comprehensive mental wellness plan.  Patient Goals:  "to go home"  Discharge Plan or Barriers: Patient recently admitted. CSW will continue to follow and assess for appropriate referrals and possible discharge planning.  Reason for Continuation of Hospitalization: Depression Medication stabilization Suicidal ideation  Estimated Length of Stay: 3-5 days  Attendees: Patient: Juan Hester 05/12/2020   Physician: Dr. Claris Gower 05/12/2020   Nursing:  05/12/2020   RN Care Manager: 05/12/2020   Social Worker: Toney Reil, [place] 05/12/2020   Recreational Therapist:  05/12/2020   Other:  05/12/2020   Other:  05/12/2020   Other: 05/12/2020       Scribe for Treatment Team: Mliss Fritz, Latanya Presser 05/12/2020 12:04 PM

## 2020-05-12 NOTE — Progress Notes (Signed)
   05/12/20 1400  Psych Admission Type (Psych Patients Only)  Admission Status Involuntary  Psychosocial Assessment  Patient Complaints Anxiety;Helplessness  Eye Contact Fair  Facial Expression Sad  Affect Sad  Speech Logical/coherent  Interaction Assertive  Motor Activity Slow  Appearance/Hygiene Unremarkable  Behavior Characteristics Cooperative;Appropriate to situation  Mood Depressed;Anxious  Thought Process  Coherency WDL  Content WDL  Delusions None reported or observed  Perception WDL  Hallucination None reported or observed  Judgment Impaired  Confusion WDL  Danger to Self  Current suicidal ideation? Denies  Danger to Others  Danger to Others None reported or observed

## 2020-05-12 NOTE — Progress Notes (Signed)
Recreation Therapy Notes  Date: 1.28.22 Time: 0930 Location: 300 Hall Group Room  Group Topic: Stress Management  Goal Area(s) Addresses:  Patient will identify positive stress management techniques. Patient will identify benefits of using stress management post d/c.  Behavioral Response: None  Intervention: Stress Management  Activity: Progressive Muscle Relaxation.  LRT read a script to guide patients through tensing each muscle group individually and then relaxing it.     Education:  Stress Management, Discharge Planning.   Education Outcome: Acknowledges Education  Clinical Observations/Feedback:  Pt came in as group was wrapping up.    Victorino Sparrow, LRT/CTRS     Victorino Sparrow A 05/12/2020 11:25 AM

## 2020-05-12 NOTE — Progress Notes (Signed)
[place] NOVEL CORONAVIRUS (COVID-19) DAILY CHECK-OFF SYMPTOMS - answer yes or no to each - every day NO YES  Have you had a fever in the past 24 hours?  . Fever (Temp > 37.80C / 100F) X   Have you had any of these symptoms in the past 24 hours? . New Cough .  Sore Throat  .  Shortness of Breath .  Difficulty Breathing .  Unexplained Body Aches   X   Have you had any one of these symptoms in the past 24 hours not related to allergies?   . Runny Nose .  Nasal Congestion .  Sneezing   X   If you have had runny nose, nasal congestion, sneezing in the past 24 hours, has it worsened?  X   EXPOSURES - check yes or no X   Have you traveled outside the state in the past 14 days?  X   Have you been in contact with someone with a confirmed diagnosis of COVID-19 or PUI in the past 14 days without wearing appropriate PPE?  X   Have you been living in the same home as a person with confirmed diagnosis of COVID-19 or a PUI (household contact)?    X   Have you been diagnosed with COVID-19?    X              What to do next: Answered NO to all: Answered YES to anything:   Proceed with unit schedule Follow the [facility] Inpatient Flowsheet.   

## 2020-05-12 NOTE — [facility] Counselor (Signed)
CSW provided patient with a pack of resources that include insurance in [facility], transportation, shelters and housing, and food. CSW will continue to speak with the patient about applying there resources to his life once he is discharged.

## 2020-05-12 NOTE — Progress Notes (Signed)
[facility] MD Progress Note  05/12/2020 11:24 AM Juan Hester  MRN:  [id number] Subjective:   Mr. Juan Hester a 40 yr old male who presented by EMS after a Suicide Attempt (overdose on multiple medications). PPHx Hester significant for Bipolar Disorder, MDD, and Anxiety.  He reports that his sleep was improved with the restart of his Seroquel. He states that his pain has been better improved with the increase in his pain medication with the agreement for stopping the Benzo medications. He reports that his appetite has been good. He reports no SI, HI, or AVH. He reports that he has had some issues with his right foot. He reports that it has been itchy for a while. Discussed starting some athletes foot cream. He reports that he has had issues with one patient on the unit but would not go into further detail. He reports no other concerns at present.  Principal Problem: Suicide attempt by drug overdose ([place]) Diagnosis: Principal Problem:   Suicide attempt by drug overdose ([place]) Active Problems:   Bipolar I disorder with depression, severe ([place])  Total Time spent with patient: 20 minutes  Past Psychiatric History: Bipolar Disorder, MDD, and Anxiety  Past Medical History:  Past Medical History:  Diagnosis Date  . Acute renal failure ([place]) 12/16/2019  . Anxiety    per pt report  . Colon cancer ([place])   . Crohn disease ([place])   . Depression    per pt report  . ETOH abuse 10/16/2019  . Foot drop, right   . Multiple subsegmental pulmonary emboli without acute cor pulmonale ([place]) 07/21/2019  . Myocardial infarction ([place])   . PE (pulmonary thromboembolism) ([place])   . PTSD (post-traumatic stress disorder)   . Thyroid disease     Past Surgical History:  Procedure Laterality Date  . ABDOMINAL SURGERY    . COLON SURGERY    . IR RADIOLOGIST EVAL & MGMT  08/31/2019  . OSTOMY    . SMALL INTESTINE SURGERY     Family History: History reviewed. No pertinent family history. Family Psychiatric  History: Mother:  Multiple personality disorder, Bipolar Sister: Multiple personality disorder, Bipolar Father: Depression Social History:  Social History   Substance and Sexual Activity  Alcohol Use Yes     Social History   Substance and Sexual Activity  Drug Use Not Currently    Social History   Socioeconomic History  . Marital status: Divorced    Spouse name: Not on file  . Number of children: Not on file  . Years of education: Not on file  . Highest education level: Not on file  Occupational History  . Not on file  Tobacco Use  . Smoking status: Current Every Day Smoker    Packs/day: 1.00  . Smokeless tobacco: Never Used  Vaping Use  . Vaping Use: Never used  Substance and Sexual Activity  . Alcohol use: Yes  . Drug use: Not Currently  . Sexual activity: Yes  Other Topics Concern  . Not on file  Social History Narrative  . Not on file   Social Determinants of Health   Financial Resource Strain: Not on file  Food Insecurity: Not on file  Transportation Needs: Not on file  Physical Activity: Not on file  Stress: Not on file  Social Connections: Not on file   Additional Social History:                         Sleep: Fair  Appetite:  Good  Current Medications: Current Facility-Administered Medications  Medication Dose Route Frequency Provider Last Rate Last Admin  . acetaminophen (TYLENOL) tablet 650 mg  650 mg Oral Q6H PRN Suella Broad, FNP      . alum & mag hydroxide-simeth (MAALOX/MYLANTA) 200-200-20 MG/5ML suspension 30 mL  30 mL Oral Q4H PRN Burt Ek, Gayland Curry, FNP      . clotrimazole (LOTRIMIN) 1 % topical solution   Topical BID [name], MD      . cyclobenzaprine (FLEXERIL) tablet 10 mg  10 mg Oral BID PRN Suella Broad, FNP   10 mg at 05/11/20 2205  . divalproex (DEPAKOTE) DR tablet 500 mg  500 mg Oral BID Suella Broad, FNP   500 mg at 05/12/20 0914  . DULoxetine (CYMBALTA) DR capsule 60 mg  60 mg Oral Daily  Suella Broad, FNP   60 mg at 05/12/20 0914  . gabapentin (NEURONTIN) capsule 600 mg  600 mg Oral TID Suella Broad, FNP   600 mg at 05/12/20 0914  . guaiFENesin (MUCINEX) 12 hr tablet 600 mg  600 mg Oral BID PRN [name], [name], MD      . hydrOXYzine (ATARAX/VISTARIL) tablet 25 mg  25 mg Oral TID PRN [name], MD   25 mg at 05/12/20 0412  . magnesium hydroxide (MILK OF MAGNESIA) suspension 30 mL  30 mL Oral Daily PRN Suella Broad, FNP      . naproxen (NAPROSYN) tablet 500 mg  500 mg Oral BID WC Suella Broad, FNP   500 mg at 05/12/20 0914  . oxyCODONE (Oxy IR/ROXICODONE) immediate release tablet 15 mg  15 mg Oral Q6H PRN [name], [name], MD   15 mg at 05/12/20 1009  . pantoprazole (PROTONIX) EC tablet 40 mg  40 mg Oral QHS Suella Broad, FNP   40 mg at 05/11/20 2205  . QUEtiapine (SEROQUEL) tablet 300 mg  300 mg Oral QHS [name], MD      . QUEtiapine (SEROQUEL) tablet 50 mg  50 mg Oral Q0600 [name], MD        Lab Results:  Results for orders placed or performed during the hospital encounter of 05/10/20 (from the past 48 hour(s))  Lipid panel     Status: Abnormal   Collection Time: 05/12/20  6:21 AM  Result Value Ref Range   Cholesterol 123 0 - 200 mg/dL   Triglycerides 193 (H) <150 mg/dL   HDL 29 (L) >40 mg/dL   Total CHOL/HDL Ratio 4.2 RATIO   VLDL 39 0 - 40 mg/dL   LDL Cholesterol 55 0 - 99 mg/dL    Comment:        Total Cholesterol/HDL:CHD Risk Coronary Heart Disease Risk Table                     Men   Women  1/2 Average Risk   3.4   3.3  Average Risk       5.0   4.4  2 X Average Risk   9.6   7.1  3 X Average Risk  23.4   11.0        Use the calculated Patient Ratio above and the CHD Risk Table to determine the patient's CHD Risk.        ATP III CLASSIFICATION (LDL):  <100     mg/dL   Optimal  100-129  mg/dL   Near or Above  Optimal  130-159  mg/dL   Borderline   160-189  mg/dL   High  >190     mg/dL   Very High Performed at [facility] [address] [place], [place] [address]   Hemoglobin A1c     Status: None   Collection Time: 05/12/20  6:21 AM  Result Value Ref Range   Hgb A1c MFr Bld 5.0 4.8 - 5.6 %    Comment: (NOTE) Pre diabetes:          5.7%-6.4%  Diabetes:              >6.4%  Glycemic control for   <7.0% adults with diabetes    Mean Plasma Glucose 96.8 mg/dL    Comment: Performed at [facility] [address], [place], [place] [address]    Blood Alcohol level:  Lab Results  Component Value Date   ETH 206 (H) 05/03/2020   ETH 242 (H) 38/88/2800    Metabolic Disorder Labs: Lab Results  Component Value Date   HGBA1C 5.0 05/12/2020   MPG 96.8 05/12/2020   No results found for: PROLACTIN Lab Results  Component Value Date   CHOL 123 05/12/2020   TRIG 193 (H) 05/12/2020   HDL 29 (L) 05/12/2020   CHOLHDL 4.2 05/12/2020   VLDL 39 05/12/2020   LDLCALC 55 05/12/2020   LDLCALC 67 05/05/2019    Physical Findings: AIMS: Facial and Oral Movements Muscles of Facial Expression: None, normal Lips and Perioral Area: None, normal Jaw: None, normal Tongue: None, normal,Extremity Movements Upper (arms, wrists, hands, fingers): None, normal Lower (legs, knees, ankles, toes): None, normal, Trunk Movements Neck, shoulders, hips: None, normal, Overall Severity Severity of abnormal movements (highest score from questions above): None, normal Incapacitation due to abnormal movements: None, normal Patient's awareness of abnormal movements (rate only patient's report): No Awareness, Dental Status Current problems with teeth and/or dentures?: Yes Does patient usually wear dentures?: No  CIWA:    COWS:     Musculoskeletal: Strength & Muscle Tone: within normal limits Gait & Station: normal Patient leans: N/A  Psychiatric Specialty Exam: Physical Exam Vitals and nursing note reviewed.   Constitutional:      Appearance: Normal appearance. He Hester normal weight.  HENT:     Head: Normocephalic and atraumatic.  Cardiovascular:     Rate and Rhythm: Normal rate.  Pulmonary:     Effort: Pulmonary effort Hester normal.  Musculoskeletal:        General: Normal range of motion.  Neurological:     General: No focal deficit present.     Mental Status: He Hester alert.     Review of Systems  Constitutional: Negative for fatigue and fever.  Respiratory: Negative for chest tightness and shortness of breath.   Cardiovascular: Negative for chest pain and palpitations.  Gastrointestinal: Negative for abdominal pain, constipation, diarrhea, nausea and vomiting.  Neurological: Negative for dizziness, weakness, light-headedness and headaches.  Psychiatric/Behavioral: Negative for suicidal ideas.    Blood pressure 124/84, pulse (!) 104, temperature (!) 97.5 F (36.4 C), temperature source Oral, resp. rate 18, height 6' 1"  (1.854 m), weight 116.5 kg, SpO2 96 %.Body mass index Hester 33.89 kg/m.  General Appearance: Casual  Eye Contact:  Good  Speech:  Clear and Coherent and Normal Rate  Volume:  Normal  Mood:  Anxious  Affect:  Anxious  Thought Process:  Coherent  Orientation:  Full (Time, Place, and Person)  Thought Content:  Logical  Suicidal Thoughts:  No  Homicidal Thoughts:  No  Memory:  Immediate;   Good Recent;   Good  Judgement:  Impaired  Insight:  Lacking  Psychomotor Activity:  Normal  Concentration:  Concentration: Fair and Attention Span: Fair  Recall:  [employer] of Knowledge:  Fair  Language:  Good  Akathisia:  No  Handed:  Right  AIMS (if indicated):     Assets:  Resilience  ADL's:  Intact  Cognition:  WNL  Sleep:  Number of Hours: 4.5     Treatment Plan Summary: Daily contact with patient to assess and evaluate symptoms and progress in treatment Mr. Stthomas Hester a 40 yr old male who presented by EMS after a Suicide Attempt (overdose on multiple medications).  PPHx Hester significant for Bipolar Disorder, MDD< and Anxiety.  Given his response to Seroquel will increase this and start a morning dose. Will start Clotrimazole for his skin rash. Will get a Depakote level tomorrow as well as a TSH and CMP. Will continue to monitor.    Bipolar Disorder: -Increase Seroquel to 50 mg AM and 300 mg QHS for mood stabilization -Continue Depakote 500 BID for mood stabilization -Continue Cymbalta 60 mg daily   Chronic Pain: -Continue Oxycodone IR 15 mg q6 PRN pain -Continue Gabapentin 600 mg TID -Continue Flexeril 10 mg BID PRN -Continue Naproxen 500 mg BID   Foot Rash: -Start Clotrimazole BID Right Foot   -Continue Mucinex 600 mg BID PRN -Continue Protonix 40 mg QHS   -Continue PRN's: Tylenol, Maalox, Atarax, Milk of Magnesia, Trazodone   [name], MD 05/12/2020, 11:24 AM

## 2020-05-13 LAB — SARS CORONAVIRUS 2 (TAT 6-24 HRS): SARS Coronavirus 2: NEGATIVE

## 2020-05-13 LAB — COMPREHENSIVE METABOLIC PANEL
ALT: 37 U/L (ref 0–44)
AST: 32 U/L (ref 15–41)
Albumin: 4 g/dL (ref 3.5–5.0)
Alkaline Phosphatase: 94 U/L (ref 38–126)
Anion gap: 11 (ref 5–15)
BUN: 29 mg/dL — ABNORMAL HIGH (ref 6–20)
CO2: 26 mmol/L (ref 22–32)
Calcium: 9 mg/dL (ref 8.9–10.3)
Chloride: 95 mmol/L — ABNORMAL LOW (ref 98–111)
Creatinine, Ser: 1.38 mg/dL — ABNORMAL HIGH (ref 0.61–1.24)
GFR, Estimated: >60 mL/min (ref >60)
Glucose, Bld: 128 mg/dL — ABNORMAL HIGH (ref 70–99)
Potassium: 5.1 mmol/L (ref 3.5–5.1)
Sodium: 132 mmol/L — ABNORMAL LOW (ref 135–145)
Total Bilirubin: 0.8 mg/dL (ref 0.3–1.2)
Total Protein: 7.6 g/dL (ref 6.5–8.1)

## 2020-05-13 LAB — VALPROIC ACID LEVEL: Valproic Acid Lvl: 43 ug/mL — ABNORMAL LOW (ref 50.0–100.0)

## 2020-05-13 NOTE — Progress Notes (Signed)
D: Patient presents with sad affect and is cooperative at time of assessment. Patient denies SI/HI at this time. Patient also denies AH/VH at this time. Patient contracts for safety.  A: Provided positive reinforcement and encouragement.  R: Patient receptive and cooperative to efforts. Patient remains safe on the unit.   05/12/20 2207  Psych Admission Type (Psych Patients Only)  Admission Status Involuntary  Psychosocial Assessment  Patient Complaints Anxiety  Eye Contact Fair  Facial Expression Flat;Sad  Affect Sad  Speech Logical/coherent  Interaction Assertive  Motor Activity Slow  Appearance/Hygiene Unremarkable  Behavior Characteristics Cooperative;Appropriate to situation  Mood Depressed;Anxious  Thought Process  Coherency WDL  Content WDL  Delusions None reported or observed  Perception WDL  Hallucination None reported or observed  Judgment Impaired  Confusion None  Danger to Self  Current suicidal ideation? Denies  Danger to Others  Danger to Others None reported or observed

## 2020-05-13 NOTE — Progress Notes (Signed)
[facility] Group Notes:  (Nursing/MHT/Case Management/Adjunct)  Date:  05/13/2020  Time:  2000  Type of Therapy:  wrap up group  Participation Level:  Active  Participation Quality:  Appropriate, Attentive, Sharing and Supportive  Affect:  Appropriate  Cognitive:  Lacking  Insight:  Improving  Engagement in Group:  Engaged  Modes of Intervention:  Clarification, Education and Support  Summary of Progress/Problems: Positive thinking and self-care were discussed.   Shellia Cleverly 05/13/2020, 10:28 PM

## 2020-05-13 NOTE — Progress Notes (Signed)
   05/13/20 0800  Psych Admission Type (Psych Patients Only)  Admission Status Involuntary  Psychosocial Assessment  Patient Complaints Other (Comment) (Pain)  Eye Contact Fair  Facial Expression Flat;Anxious  Affect Anxious;Irritable  Speech Logical/coherent  Interaction Assertive  Motor Activity Slow  Appearance/Hygiene Unremarkable  Behavior Characteristics Agitated  Mood Anxious  Thought Process  Coherency WDL  Content WDL  Delusions None reported or observed  Perception WDL  Hallucination None reported or observed  Judgment Impaired  Confusion None  Danger to Self  Current suicidal ideation? Denies  Danger to Others  Danger to Others None reported or observed    [place] [place] NOVEL CORONAVIRUS (COVID-19) DAILY CHECK-OFF SYMPTOMS - answer yes or no to each - every day NO YES  Have you had a fever in the past 24 hours?  Fever (Temp > 37.80C / 100F) X    Have you had any of these symptoms in the past 24 hours? New Cough  Sore Throat   Shortness of Breath  Difficulty Breathing  Unexplained Body Aches   X    Have you had any one of these symptoms in the past 24 hours not related to allergies?   Runny Nose  Nasal Congestion  Sneezing   X    If you have had runny nose, nasal congestion, sneezing in the past 24 hours, has it worsened?   X    EXPOSURES - check yes or no X    Have you traveled outside the state in the past 14 days?   X    Have you been in contact with someone with a confirmed diagnosis of COVID-19 or PUI in the past 14 days without wearing appropriate PPE?   X    Have you been living in the same home as a person with confirmed diagnosis of COVID-19 or a PUI (household contact)?     X    Have you been diagnosed with COVID-19?     X                                                                                                                             What to do next: Answered NO to all: Answered YES to anything:    Proceed with unit schedule  Follow the [facility] Inpatient Flowsheet.

## 2020-05-13 NOTE — Progress Notes (Signed)
[facility] MD Progress Note  05/13/2020 7:20 AM Juan Hester  MRN:  [id number] Subjective:   Juan Hester is a 40 yr old male who presented by EMS after a Suicide Attempt (overdose on multiple medications). PPHx is significant for Bipolar Disorder, MDD, and Anxiety.  He reported severe issues this morning. He reports that around 4:30 he asked the nurse tech for his pain medication while the tech was doing the 15 minute checks. Patient states that he asked multiple times for his pain medication but it would not be given to him. Then when he was having his vitals taken this morning when he tried to sit up his ostomy bag burst covering himself and the floor with its contents. He states it then took multiple calls from his room to have help in getting fresh cloths and the contents cleaned. He reports this continued to frustrate him when he still could not get his pain medication though he went to the med window and asked multiple times.    While relaying this to me he stated that he is so frustrated he has thoughts of attempting to overdose again. He then stated that he was not being serious but that he is very frustrated. He reports no HI or AVH. Discussed importance of attending group therapy to work on developing and refining coping skills so that he can better handle these types of situations in the future. He reports that his sleep was ok last night and that his appetite is similarly ok. He reports no other concerns at present.   Principal Problem: Suicide attempt by drug overdose ([place]) Diagnosis: Principal Problem:   Suicide attempt by drug overdose ([place]) Active Problems:   Bipolar I disorder with depression, severe ([place])  Total Time spent with patient: 20 minutes  Past Psychiatric History: Bipolar Disorder, MDD, and Anxiety  Past Medical History:  Past Medical History:  Diagnosis Date  . Acute renal failure ([place]) 12/16/2019  . Anxiety    per pt report  . Colon cancer ([place])   . Crohn disease  ([place])   . Depression    per pt report  . ETOH abuse 10/16/2019  . Foot drop, right   . Multiple subsegmental pulmonary emboli without acute cor pulmonale ([place]) 07/21/2019  . Myocardial infarction ([place])   . PE (pulmonary thromboembolism) ([place])   . PTSD (post-traumatic stress disorder)   . Thyroid disease     Past Surgical History:  Procedure Laterality Date  . ABDOMINAL SURGERY    . COLON SURGERY    . IR RADIOLOGIST EVAL & MGMT  08/31/2019  . OSTOMY    . SMALL INTESTINE SURGERY     Family History: History reviewed. No pertinent family history. Family Psychiatric  History: Mother: Multiple personality disorder, Bipolar Sister: Multiple personality disorder, Bipolar Father: Depression Social History:  Social History   Substance and Sexual Activity  Alcohol Use Yes     Social History   Substance and Sexual Activity  Drug Use Not Currently    Social History   Socioeconomic History  . Marital status: Divorced    Spouse name: Not on file  . Number of children: Not on file  . Years of education: Not on file  . Highest education level: Not on file  Occupational History  . Not on file  Tobacco Use  . Smoking status: Current Every Day Smoker    Packs/day: 1.00  . Smokeless tobacco: Never Used  Vaping Use  . Vaping Use: Never used  Substance and  Sexual Activity  . Alcohol use: Yes  . Drug use: Not Currently  . Sexual activity: Yes  Other Topics Concern  . Not on file  Social History Narrative  . Not on file   Social Determinants of Health   Financial Resource Strain: Not on file  Food Insecurity: Not on file  Transportation Needs: Not on file  Physical Activity: Not on file  Stress: Not on file  Social Connections: Not on file   Additional Social History:                         Sleep: Fair  Appetite:  Fair  Current Medications: Current Facility-Administered Medications  Medication Dose Route Frequency Provider Last Rate Last Admin  .  acetaminophen (TYLENOL) tablet 650 mg  650 mg Oral Q6H PRN Suella Broad, FNP      . alum & mag hydroxide-simeth (MAALOX/MYLANTA) 200-200-20 MG/5ML suspension 30 mL  30 mL Oral Q4H PRN Burt Ek, Gayland Curry, FNP      . clotrimazole (LOTRIMIN) 1 % cream   Topical BID Sharma Covert, MD   Given at 05/12/20 2208  . cyclobenzaprine (FLEXERIL) tablet 10 mg  10 mg Oral BID PRN Suella Broad, FNP   10 mg at 05/12/20 2207  . divalproex (DEPAKOTE) DR tablet 500 mg  500 mg Oral BID Suella Broad, FNP   500 mg at 05/12/20 1730  . DULoxetine (CYMBALTA) DR capsule 60 mg  60 mg Oral Daily Suella Broad, FNP   60 mg at 05/12/20 0914  . gabapentin (NEURONTIN) capsule 600 mg  600 mg Oral TID Suella Broad, FNP   600 mg at 05/12/20 1730  . guaiFENesin (MUCINEX) 12 hr tablet 600 mg  600 mg Oral BID PRN [name], [name], MD      . hydrOXYzine (ATARAX/VISTARIL) tablet 25 mg  25 mg Oral TID PRN [name], MD   25 mg at 05/12/20 2207  . magnesium hydroxide (MILK OF MAGNESIA) suspension 30 mL  30 mL Oral Daily PRN Suella Broad, FNP      . naproxen (NAPROSYN) tablet 500 mg  500 mg Oral BID WC Suella Broad, FNP   500 mg at 05/12/20 1730  . oxyCODONE (Oxy IR/ROXICODONE) immediate release tablet 15 mg  15 mg Oral Q6H PRN [name], [name], MD   15 mg at 05/12/20 2207  . pantoprazole (PROTONIX) EC tablet 40 mg  40 mg Oral QHS Suella Broad, FNP   40 mg at 05/12/20 2207  . QUEtiapine (SEROQUEL) tablet 300 mg  300 mg Oral QHS [name], MD   300 mg at 05/12/20 2207  . QUEtiapine (SEROQUEL) tablet 50 mg  50 mg Oral Q0600 [name], MD   50 mg at 05/12/20 1324    Lab Results:  Results for orders placed or performed during the hospital encounter of 05/10/20 (from the past 48 hour(s))  Lipid panel     Status: Abnormal   Collection Time: 05/12/20  6:21 AM  Result Value Ref Range   Cholesterol 123 0 - 200 mg/dL    Triglycerides 193 (H) <150 mg/dL   HDL 29 (L) >40 mg/dL   Total CHOL/HDL Ratio 4.2 RATIO   VLDL 39 0 - 40 mg/dL   LDL Cholesterol 55 0 - 99 mg/dL    Comment:        Total Cholesterol/HDL:CHD Risk Coronary Heart Disease Risk Table  Men   Women  1/2 Average Risk   3.4   3.3  Average Risk       5.0   4.4  2 X Average Risk   9.6   7.1  3 X Average Risk  23.4   11.0        Use the calculated Patient Ratio above and the CHD Risk Table to determine the patient's CHD Risk.        ATP III CLASSIFICATION (LDL):  <100     mg/dL   Optimal  100-129  mg/dL   Near or Above                    Optimal  130-159  mg/dL   Borderline  160-189  mg/dL   High  >190     mg/dL   Very High Performed at [facility] [address], [place], [place] [address]   Hemoglobin A1c     Status: None   Collection Time: 05/12/20  6:21 AM  Result Value Ref Range   Hgb A1c MFr Bld 5.0 4.8 - 5.6 %    Comment: (NOTE) Pre diabetes:          5.7%-6.4%  Diabetes:              >6.4%  Glycemic control for   <7.0% adults with diabetes    Mean Plasma Glucose 96.8 mg/dL    Comment: Performed at [facility] [address] [place], [place] [address]  SARS CORONAVIRUS 2 (TAT 6-24 HRS) Nasopharyngeal Nasopharyngeal Swab     Status: None   Collection Time: 05/12/20  4:08 PM   Specimen: Nasopharyngeal Swab  Result Value Ref Range   SARS Coronavirus 2 NEGATIVE NEGATIVE    Comment: (NOTE) SARS-CoV-2 target nucleic acids are NOT DETECTED.  The SARS-CoV-2 RNA is generally detectable in upper and lower respiratory specimens during the acute phase of infection. Negative results do not preclude SARS-CoV-2 infection, do not rule out co-infections with other pathogens, and should not be used as the sole basis for treatment or other patient management decisions. Negative results must be combined with clinical observations, patient history, and epidemiological information. The  expected result is Negative.  Fact Sheet for Patients: SugarRoll.be  Fact Sheet for Healthcare Providers: https://www.woods-mathews.com/  This test is not yet approved or cleared by the [place] FDA and  has been authorized for detection and/or diagnosis of SARS-CoV-2 by FDA under an Emergency Use Authorization (EUA). This EUA will remain  in effect (meaning this test can be used) for the duration of the COVID-19 declaration under Se ction 564(b)(1) of the Act, 21 U.S.C. section 360bbb-3(b)(1), unless the authorization is terminated or revoked sooner.  Performed at [facility], [place] [address], [place], [place] [address]     Blood Alcohol level:  Lab Results  Component Value Date   ETH 206 (H) 05/03/2020   ETH 242 (H) 16/94/5038    Metabolic Disorder Labs: Lab Results  Component Value Date   HGBA1C 5.0 05/12/2020   MPG 96.8 05/12/2020   No results found for: PROLACTIN Lab Results  Component Value Date   CHOL 123 05/12/2020   TRIG 193 (H) 05/12/2020   HDL 29 (L) 05/12/2020   CHOLHDL 4.2 05/12/2020   VLDL 39 05/12/2020   LDLCALC 55 05/12/2020   LDLCALC 67 05/05/2019    Physical Findings: AIMS: Facial and Oral Movements Muscles of Facial Expression: None, normal Lips and Perioral  Area: None, normal Jaw: None, normal Tongue: None, normal,Extremity Movements Upper (arms, wrists, hands, fingers): None, normal Lower (legs, knees, ankles, toes): None, normal, Trunk Movements Neck, shoulders, hips: None, normal, Overall Severity Severity of abnormal movements (highest score from questions above): None, normal Incapacitation due to abnormal movements: None, normal Patient's awareness of abnormal movements (rate only patient's report): No Awareness, Dental Status Current problems with teeth and/or dentures?: Yes Does patient usually wear dentures?: No  CIWA:    COWS:     Musculoskeletal: Strength & Muscle Tone:  within normal limits Gait & Station: normal Patient leans: N/A  Psychiatric Specialty Exam: Physical Exam Vitals and nursing note reviewed.  Constitutional:      General: He is not in acute distress.    Appearance: Normal appearance. He is normal weight. He is not ill-appearing, toxic-appearing or diaphoretic.  HENT:     Head: Normocephalic and atraumatic.  Cardiovascular:     Rate and Rhythm: Normal rate.  Pulmonary:     Effort: Pulmonary effort is normal.  Musculoskeletal:        General: Normal range of motion.  Neurological:     General: No focal deficit present.     Mental Status: He is alert.     Review of Systems  Constitutional: Negative for fatigue and fever.  Respiratory: Negative for chest tightness and shortness of breath.   Gastrointestinal: Negative for abdominal pain, constipation, diarrhea, nausea and vomiting.  Neurological: Negative for dizziness, weakness, light-headedness and headaches.  Psychiatric/Behavioral: Positive for agitation and suicidal ideas (fleeting).    Blood pressure 124/84, pulse (!) 104, temperature (!) 97.5 F (36.4 C), temperature source Oral, resp. rate 18, height 6' 1"  (1.854 m), weight 116.5 kg, SpO2 96 %.Body mass index is 33.89 kg/m.  General Appearance: In scrubs  Eye Contact:  Good  Speech:  Clear and Coherent and mildly pressured  Volume:  Normal  Mood:  Angry and Anxious  Affect:  Angry and Anxious  Thought Process:  Coherent  Orientation:  Full (Time, Place, and Person)  Thought Content:  Logical  Suicidal Thoughts:  Yes.  with intent/plan  Homicidal Thoughts:  No  Memory:  Immediate;   Fair Recent;   Fair  Judgement:  Poor  Insight:  Shallow  Psychomotor Activity:  Normal  Concentration:  Concentration: Fair and Attention Span: Fair  Recall:  Good  Fund of Knowledge:  Good  Language:  Good  Akathisia:  No  Handed:  Right  AIMS (if indicated):     Assets:  Resilience  ADL's:  Intact  Cognition:  WNL  Sleep:   Number of Hours: 6.5     Treatment Plan Summary: Daily contact with patient to assess and evaluate symptoms and progress in treatment Mr. Tadros is a 40 yr old male who presented by EMS after a Suicide Attempt (overdose on multiple medications). PPHx is significant for Bipolar Disorder, MDD, and Anxiety.   He continues to downplay and minimize his attempt. Given what happened this morning he expressed thought of attempting again but it appeared to be said in frustration not actual desire but still underscores his limited insight into it. As long as this is a one time episode he should still be ok for a discharge around the middle of next week. Will not make any changes to medications at this point. Will continue to monitor.     Bipolar Disorder: -Continue Seroquel to 50 mg AM and 300 mg QHS for mood stabilization -Continue Depakote 500 BID for  mood stabilization -Continue Cymbalta 60 mg daily   Chronic Pain: -Continue Oxycodone IR 15 mg q6 PRN pain -Continue Gabapentin 600 mg TID -Continue Flexeril 10 mg BID PRN -Continue Naproxen 500 mg BID   Foot Rash: -Continue Clotrimazole BID Right Foot   -Continue Mucinex 600 mg BID PRN -Continue Protonix 40 mg QHS   -Continue PRN's: Tylenol, Maalox, Atarax, Milk of Magnesia, Trazodone   [name], MD 05/13/2020, 7:20 AM

## 2020-05-13 NOTE — Progress Notes (Signed)
D: Patient presents with pleasant affect at time of assessment and reports having a good day despite having a rough start. Patient denies SI/HI at this time. Patient also denies AH/VH at this time. Patient contracts for safety.  A: Provided positive reinforcement and encouragement.  R: Patient cooperative and receptive to efforts. Patient remains safe on the unit.   05/13/20 2109  Psych Admission Type (Psych Patients Only)  Admission Status Involuntary  Psychosocial Assessment  Patient Complaints Other (Comment) (Pain)  Eye Contact Fair  Facial Expression Flat;Anxious  Affect Appropriate to circumstance  Speech Logical/coherent  Interaction Assertive  Motor Activity Slow  Appearance/Hygiene Unremarkable  Behavior Characteristics Cooperative;Appropriate to situation  Mood Anxious  Thought Process  Coherency WDL  Content WDL  Delusions None reported or observed  Perception WDL  Hallucination None reported or observed  Judgment Impaired  Confusion None  Danger to Self  Current suicidal ideation? Denies  Danger to Others  Danger to Others None reported or observed

## 2020-05-14 LAB — COMPREHENSIVE METABOLIC PANEL
ALT: 32 U/L (ref 0–44)
AST: 29 U/L (ref 15–41)
Albumin: 3.3 g/dL — ABNORMAL LOW (ref 3.5–5.0)
Alkaline Phosphatase: 80 U/L (ref 38–126)
Anion gap: 9 (ref 5–15)
BUN: 25 mg/dL — ABNORMAL HIGH (ref 6–20)
CO2: 29 mmol/L (ref 22–32)
Calcium: 8.9 mg/dL (ref 8.9–10.3)
Chloride: 97 mmol/L — ABNORMAL LOW (ref 98–111)
Creatinine, Ser: 1.36 mg/dL — ABNORMAL HIGH (ref 0.61–1.24)
GFR, Estimated: >60 mL/min (ref >60)
Glucose, Bld: 97 mg/dL (ref 70–99)
Potassium: 4.6 mmol/L (ref 3.5–5.1)
Sodium: 135 mmol/L (ref 135–145)
Total Bilirubin: 0.7 mg/dL (ref 0.3–1.2)
Total Protein: 6.6 g/dL (ref 6.5–8.1)

## 2020-05-14 MED ORDER — DIVALPROEX SODIUM 500 MG PO DR TAB
500.0000 mg | DELAYED_RELEASE_TABLET | Freq: Every day | ORAL | Status: DC
  Administered 2020-05-15: 500 mg via ORAL
  Filled 2020-05-14: qty 7
  Filled 2020-05-14 (×2): qty 1

## 2020-05-14 MED ORDER — DIVALPROEX SODIUM 500 MG PO DR TAB
750.0000 mg | DELAYED_RELEASE_TABLET | Freq: Every day | ORAL | Status: DC
  Administered 2020-05-14: 750 mg via ORAL
  Filled 2020-05-14 (×3): qty 1

## 2020-05-14 NOTE — [facility] Group Notes (Signed)
Adult Psychoeducational Group Not Date:  05/14/2020 Time:  3494-9447 Group Topic/Focus: PROGRESSIVE RELAXATION. A group where deep breathing is taught and tensing and relaxation muscle groups is used. Imagery is used as well.  Pts are asked to imagine 3 pillars that hold them up when they are not able to hold themselves up.  Participation Level:  Active  Participation Quality:  Appropriate  Affect:  Appropriate  Cognitive:  Oriented  Insight: Improving  Engagement in Group:  Engaged  Modes of Intervention:  Activity, Discussion, Education, and Support  Additional Comments:  Pt stayed for the whole group except when the other Pts starting saying what holds them up, He left the room  Paulino Rily 05/07/2020

## 2020-05-14 NOTE — Plan of Care (Signed)
Nurse and staff discussed coping skills with patient.
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
## 2020-05-14 NOTE — [facility] Group Notes (Signed)
[facility] LCSW Group Therapy Note  05/14/2020  10:15-11:15am  Type of Therapy and Topic:  Group Therapy:  Adding Supports Including Yourself  Participation Level:  Active   Description of Group:   Patients in this group were introduced to the concept that additional supports including self-support are an essential part of recovery.  Patients listed what supports they believe they need to add to their lives to achieve their goals at discharge, and they listed such things as therapist, family, doctor, support groups, 12-step groups and service animals.   A song entitled "My Own Hero" was played and a group discussion ensued in which patients stated they could relate to the song and it inspired them to realize they have to be willing to help themselves in order to succeed, because other people cannot achieve sobriety or stability for them.  Additional song ("I Am Enough") was played to encourage patients toward self-advocacy and self-support as part of their recovery.  They discussed their reactions to these songs' messages, which were positive and hopeful.  Therapeutic Goals: 1)  demonstrate the importance of being a key part of one's own support system 2)  discuss various available supports 3)  encourage patient to use music as part of their self-support and focus on goals 4)  elicit ideas from patients about supports that need to be added   Summary of Patient Progress:  The patient expressed himself several times during group and was accepting of the concepts presented.    Therapeutic Modalities:   Education Activity Processing  Maretta Los

## 2020-05-14 NOTE — Plan of Care (Signed)
Nurse discussed coping skills with patient.  

## 2020-05-14 NOTE — Progress Notes (Signed)
[facility] [place] MD Progress Note  05/14/2020 10:44 AM Juan Hester  MRN:  [id number] Subjective:  Patient is very irritable this AM and is yelling at the nurse. Nurse Rise Paganini has brought patient medications to his room as he was at the window but left and went to the bathroom. Patient was yelling at MD and RN about being chased like a child in order to get his medications. Patient asked MD "you are taking her side and not mine!" Patient was demanding to know who was everyone's boss. Patient was yelling RN was exhibiting "childish behavior" and that he felt like he needed to yell to get his point across. Patient reports that he did not want to take his medications at the time MD was interviewing him and RN brought his medications.  Patient later apologized to RN in front of MD about his behavior and RN apologized to patient for making him feel uncomfortable. MD also witnessed RN give patient his medications. Patient reported that he slept his usual amount since being inpatient and does not feel tired this AM. Patient reports that his appetite also remains good. Patient reports that he is not currently suicidal nor does he endorse HI, nor AVH. Patient acknowledges that he is hospitalized because of his SA and he is now calling his attempt a "window" and reports that it was similar to the time he decided to get a colostomy. Patient reported that he has been reminded that he has a reason to live and that he does enjoy his life. Patient recalls that he had been against getting a colostomy until he was told that his life span would drastically decrease without it. Patient reports that he would have reached out to his counselor prior to his attempt if he had known that he could have. Patient reports that if he were to start feeling the same way he would reach out to his therapist and at least give them to chance to help him.  Principal Problem: Suicide attempt by drug overdose ([place]) Diagnosis: Principal Problem:   Suicide  attempt by drug overdose ([place]) Active Problems:   Bipolar I disorder with depression, severe ([place])  Total Time spent with patient: 20 minutes  Past Psychiatric History: See H&P  Past Medical History:  Past Medical History:  Diagnosis Date  . Acute renal failure ([place]) 12/16/2019  . Anxiety    per pt report  . Colon cancer ([place])   . Crohn disease ([place])   . Depression    per pt report  . ETOH abuse 10/16/2019  . Foot drop, right   . Multiple subsegmental pulmonary emboli without acute cor pulmonale ([place]) 07/21/2019  . Myocardial infarction ([place])   . PE (pulmonary thromboembolism) ([place])   . PTSD (post-traumatic stress disorder)   . Thyroid disease     Past Surgical History:  Procedure Laterality Date  . ABDOMINAL SURGERY    . COLON SURGERY    . IR RADIOLOGIST EVAL & MGMT  08/31/2019  . OSTOMY    . SMALL INTESTINE SURGERY     Family History: History reviewed. No pertinent family history. Family Psychiatric  History: See H&P Social History:  Social History   Substance and Sexual Activity  Alcohol Use Yes     Social History   Substance and Sexual Activity  Drug Use Not Currently    Social History   Socioeconomic History  . Marital status: Divorced    Spouse name: Not on file  . Number of children: Not on file  .  Years of education: Not on file  . Highest education level: Not on file  Occupational History  . Not on file  Tobacco Use  . Smoking status: Current Every Day Smoker    Packs/day: 1.00  . Smokeless tobacco: Never Used  Vaping Use  . Vaping Use: Never used  Substance and Sexual Activity  . Alcohol use: Yes  . Drug use: Not Currently  . Sexual activity: Yes  Other Topics Concern  . Not on file  Social History Narrative  . Not on file   Social Determinants of Health   Financial Resource Strain: Not on file  Food Insecurity: Not on file  Transportation Needs: Not on file  Physical Activity: Not on file  Stress: Not on file  Social Connections: Not  on file   Additional Social History:                         Sleep: Fair  Appetite:  Good  Current Medications: Current Facility-Administered Medications  Medication Dose Route Frequency Provider Last Rate Last Admin  . acetaminophen (TYLENOL) tablet 650 mg  650 mg Oral Q6H PRN Suella Broad, FNP      . alum & mag hydroxide-simeth (MAALOX/MYLANTA) 200-200-20 MG/5ML suspension 30 mL  30 mL Oral Q4H PRN Burt Ek, Gayland Curry, FNP      . clotrimazole (LOTRIMIN) 1 % cream   Topical BID Sharma Covert, MD   Given at 05/13/20 0825  . cyclobenzaprine (FLEXERIL) tablet 10 mg  10 mg Oral BID PRN Suella Broad, FNP   10 mg at 05/14/20 1009  . divalproex (DEPAKOTE) DR tablet 500 mg  500 mg Oral BID Suella Broad, FNP   500 mg at 05/14/20 0816  . DULoxetine (CYMBALTA) DR capsule 60 mg  60 mg Oral Daily Suella Broad, FNP   60 mg at 05/14/20 0816  . gabapentin (NEURONTIN) capsule 600 mg  600 mg Oral TID Suella Broad, FNP   600 mg at 05/14/20 0816  . guaiFENesin (MUCINEX) 12 hr tablet 600 mg  600 mg Oral BID PRN [name], [name], MD      . hydrOXYzine (ATARAX/VISTARIL) tablet 25 mg  25 mg Oral TID PRN [name], [name], MD   25 mg at 05/14/20 1008  . magnesium hydroxide (MILK OF MAGNESIA) suspension 30 mL  30 mL Oral Daily PRN Suella Broad, FNP      . naproxen (NAPROSYN) tablet 500 mg  500 mg Oral BID WC Suella Broad, FNP   500 mg at 05/14/20 0816  . oxyCODONE (Oxy IR/ROXICODONE) immediate release tablet 15 mg  15 mg Oral Q6H PRN [name], [name], MD   15 mg at 05/14/20 1007  . pantoprazole (PROTONIX) EC tablet 40 mg  40 mg Oral QHS Suella Broad, FNP   40 mg at 05/13/20 2109  . QUEtiapine (SEROQUEL) tablet 300 mg  300 mg Oral QHS [name], MD   300 mg at 05/13/20 2109  . QUEtiapine (SEROQUEL) tablet 50 mg  50 mg Oral Q0600 [name], MD   50 mg at 05/14/20 0277    Lab Results:   Results for orders placed or performed during the hospital encounter of 05/10/20 (from the past 48 hour(s))  SARS CORONAVIRUS 2 (TAT 6-24 HRS) Nasopharyngeal Nasopharyngeal Swab     Status: None   Collection Time: 05/12/20  4:08 PM   Specimen: Nasopharyngeal Swab  Result Value Ref Range  SARS Coronavirus 2 NEGATIVE NEGATIVE    Comment: (NOTE) SARS-CoV-2 target nucleic acids are NOT DETECTED.  The SARS-CoV-2 RNA is generally detectable in upper and lower respiratory specimens during the acute phase of infection. Negative results do not preclude SARS-CoV-2 infection, do not rule out co-infections with other pathogens, and should not be used as the sole basis for treatment or other patient management decisions. Negative results must be combined with clinical observations, patient history, and epidemiological information. The expected result is Negative.  Fact Sheet for Patients: SugarRoll.be  Fact Sheet for Healthcare Providers: https://www.woods-mathews.com/  This test is not yet approved or cleared by the [place] FDA and  has been authorized for detection and/or diagnosis of SARS-CoV-2 by FDA under an Emergency Use Authorization (EUA). This EUA will remain  in effect (meaning this test can be used) for the duration of the COVID-19 declaration under Se ction 564(b)(1) of the Act, 21 U.S.C. section 360bbb-3(b)(1), unless the authorization is terminated or revoked sooner.  Performed at [facility], [place] [address], [place], [place] [address]   Comprehensive metabolic panel     Status: Abnormal   Collection Time: 05/13/20  5:42 PM  Result Value Ref Range   Sodium 132 (L) 135 - 145 mmol/L   Potassium 5.1 3.5 - 5.1 mmol/L   Chloride 95 (L) 98 - 111 mmol/L   CO2 26 22 - 32 mmol/L   Glucose, Bld 128 (H) 70 - 99 mg/dL    Comment: Glucose reference range applies only to samples taken after fasting for at least 8 hours.   BUN 29  (H) 6 - 20 mg/dL   Creatinine, Ser 1.38 (H) 0.61 - 1.24 mg/dL   Calcium 9.0 8.9 - 10.3 mg/dL   Total Protein 7.6 6.5 - 8.1 g/dL   Albumin 4.0 3.5 - 5.0 g/dL   AST 32 15 - 41 U/L   ALT 37 0 - 44 U/L   Alkaline Phosphatase 94 38 - 126 U/L   Total Bilirubin 0.8 0.3 - 1.2 mg/dL   GFR, Estimated >60 >60 mL/min    Comment: (NOTE) Calculated using the CKD-EPI Creatinine Equation (2021)    Anion gap 11 5 - 15    Comment: Performed at [facility], [place] [address] [place], [place] [address]  Valproic acid level     Status: Abnormal   Collection Time: 05/13/20  5:42 PM  Result Value Ref Range   Valproic Acid Lvl 43 (L) 50.0 - 100.0 ug/mL    Comment: Performed at [facility], [place] [address] [place], [place] [address]  Comprehensive metabolic panel     Status: Abnormal   Collection Time: 05/14/20  6:48 AM  Result Value Ref Range   Sodium 135 135 - 145 mmol/L   Potassium 4.6 3.5 - 5.1 mmol/L   Chloride 97 (L) 98 - 111 mmol/L   CO2 29 22 - 32 mmol/L   Glucose, Bld 97 70 - 99 mg/dL    Comment: Glucose reference range applies only to samples taken after fasting for at least 8 hours.   BUN 25 (H) 6 - 20 mg/dL   Creatinine, Ser 1.36 (H) 0.61 - 1.24 mg/dL   Calcium 8.9 8.9 - 10.3 mg/dL   Total Protein 6.6 6.5 - 8.1 g/dL   Albumin 3.3 (L) 3.5 - 5.0 g/dL   AST 29 15 - 41 U/L   ALT 32 0 - 44 U/L   Alkaline Phosphatase 80 38 - 126 U/L  Total Bilirubin 0.7 0.3 - 1.2 mg/dL   GFR, Estimated >60 >60 mL/min    Comment: (NOTE) Calculated using the CKD-EPI Creatinine Equation (2021)    Anion gap 9 5 - 15    Comment: Performed at [facility], [place] [address] [place], [place] [address]    Blood Alcohol level:  Lab Results  Component Value Date   ETH 206 (H) 05/03/2020   ETH 242 (H) 67/03/4579    Metabolic Disorder Labs: Lab Results  Component Value Date   HGBA1C 5.0 05/12/2020   MPG 96.8 05/12/2020   No results found for:  PROLACTIN Lab Results  Component Value Date   CHOL 123 05/12/2020   TRIG 193 (H) 05/12/2020   HDL 29 (L) 05/12/2020   CHOLHDL 4.2 05/12/2020   VLDL 39 05/12/2020   LDLCALC 55 05/12/2020   LDLCALC 67 05/05/2019    Physical Findings: AIMS: Facial and Oral Movements Muscles of Facial Expression: None, normal Lips and Perioral Area: None, normal Jaw: None, normal Tongue: None, normal,Extremity Movements Upper (arms, wrists, hands, fingers): None, normal Lower (legs, knees, ankles, toes): None, normal, Trunk Movements Neck, shoulders, hips: None, normal, Overall Severity Severity of abnormal movements (highest score from questions above): None, normal Incapacitation due to abnormal movements: None, normal Patient's awareness of abnormal movements (rate only patient's report): No Awareness, Dental Status Current problems with teeth and/or dentures?: Yes Does patient usually wear dentures?: No  CIWA:    COWS:     Musculoskeletal: Strength & Muscle Tone: within normal limits Gait & Station: walks with cane Patient leans: towards his cane  Psychiatric Specialty Exam: Physical Exam HENT:     Head: Normocephalic.  Pulmonary:     Effort: Pulmonary effort is normal.  Neurological:     Mental Status: He is alert.     Review of Systems  Cardiovascular: Negative for chest pain.  Gastrointestinal: Negative for abdominal pain.  Musculoskeletal: Positive for back pain.    Blood pressure 115/79, pulse (!) 102, temperature 97.8 F (36.6 C), temperature source Oral, resp. rate 18, height 6' 1"  (1.854 m), weight 116.5 kg, SpO2 97 %.Body mass index is 33.89 kg/m.  General Appearance: Casual  Eye Contact:  Good  Speech:  Pressured and verbose. Appears to always try to plead his case despite the fact that no one has accused him of anything.  Constantly states throughout interview "do you see it from my point of view" when addressing multiple topics from his past to the present.  Volume:   Increased  Mood:  Irritable  Affect:  Labile  Thought Process:  Linear "why are you taking her side and not mine!" Can only do one thing at a time.   Orientation:  NA  Thought Content:  Rumination on RN and his medications, but could be redirectable.   Suicidal Thoughts:  No  Homicidal Thoughts:  No  Memory:  Recent;   Good  Judgement:  Impaired  Insight:  Shallow  Psychomotor Activity:  Increased  Concentration:  Concentration: Fair  Recall:  NA  Fund of Knowledge:  Fair  Language:  Good  Akathisia:  No  Handed:  Right  AIMS (if indicated):     Assets:  Housing Leisure Time Resilience  ADL's:  Intact  Cognition:  WNL  Sleep:  Number of Hours: 6     Treatment Plan Summary: Daily contact with patient to assess and evaluate symptoms and progress in treatment Mr. Vangorder is a 40 yr old male who presented  by EMS after a Suicide Attempt (overdose on multiple medications). PPHx is significant for Bipolar Disorder, MDD, and Anxiety. Patient was able to communicate a plan to prevent a repeat attempt today and was more able to articulate how he feels since his attempt. Patient reported more remorse and identified that he does wish to continue to live and was also able to recall another time recently where he realized how badly he wanted to live. Patient was however very irritable this AM and appeared to have issues with mood impulsivity and his speech was very pressured. Will increase patient Depakote. His Depakote level was subtherapeutic at 44 as well. It was communicated to patient that he is not allowed to berate staff and patient reported understanding.    Bipolar Disorder: -Continue Seroquel to 50 mg AM and 300 mg QHS for mood stabilization -Increase Depakote 500 and 750 QHS  for mood stabilization -Continue Cymbalta 60 mg daily   Chronic Pain: -Continue Oxycodone IR 15 mg q6 PRN pain -Continue Gabapentin 600 mg TID -Continue Flexeril 10 mg BID PRN -Continue Naproxen 500 mg  BID Will need to call patient OP tom. ot confirm his dose of prescribed OxyIR to make sure that patient will not go into withdrawal when he returns home.   Foot Rash: -Continue Clotrimazole BID Right Foot   -Continue Mucinex 600 mg BID PRN -Continue Protonix 40 mg QHS   -Continue PRN's: Tylenol, Maalox, Atarax, Milk of Magnesia, Trazodone  PGY-1 Freida Busman, MD 05/14/2020, 10:44 AM

## 2020-05-14 NOTE — [facility] Group Notes (Signed)
.  Psychoeducational Group Note  Date: 04-16-27-2222 Time: 0900-1000    Goal Setting   Purpose of Group: This group helps to provide patients with the steps of setting a goal that is specific, measurable, attainable, realistic and time specific. A discussion on how we keep ourselves stuck with negative self talk.    Participation Level:  Did not attend   [place] Bring A  

## 2020-05-15 MED ORDER — QUETIAPINE FUMARATE 300 MG PO TABS: 300.0000 mg | ORAL_TABLET | Freq: Every day | ORAL | 0 refills | Status: DC

## 2020-05-15 MED ORDER — DIVALPROEX SODIUM 250 MG PO DR TAB: 750.0000 mg | DELAYED_RELEASE_TABLET | Freq: Every day | ORAL | 0 refills | Status: DC

## 2020-05-15 MED ORDER — DIVALPROEX SODIUM 500 MG PO DR TAB: 500.0000 mg | DELAYED_RELEASE_TABLET | Freq: Every day | ORAL | 0 refills | Status: DC

## 2020-05-15 MED ORDER — DIVALPROEX SODIUM 250 MG PO DR TAB
750.0000 mg | DELAYED_RELEASE_TABLET | Freq: Every day | ORAL | Status: DC
  Filled 2020-05-15: qty 21

## 2020-05-15 MED ORDER — QUETIAPINE FUMARATE 50 MG PO TABS: 50.0000 mg | ORAL_TABLET | Freq: Every day | ORAL | 0 refills | Status: DC

## 2020-05-15 NOTE — Discharge Summary (Signed)
Physician Discharge Summary Note  Patient:  Juan Hester is an 40 y.o., male MRN:  [id number] DOB:  05/21/1980 Patient phone:  [phone] (home)  Patient address:   [address] [place] [address],  Total Time spent with patient: 20 minutes  Date of Admission:  05/10/2020 Date of Discharge: 05/15/2020  Reason for Admission:  Per H&P: "Pt is a 40yo male with pmh of bipolar schizoaffective, chron's, colon cx s/p colectomy w/ colostomy, hx of PE and MI. Pt presented to the ED after attempted suicide by intentional OD of seroquel, flexeril, oxycodone, clonazapam, and cymbalta on 01/19 which required intubation and care in the ICU. Pt was medically stabilized and d/c to [facility] on 01/26.   Pt states that his depressive symptoms began in 2006 when his wife left him and took his two kids who were 76 and 68 years old at the time. Pt states that this episode "really fucked him up". He states that his kids "meant the world" to him. After his wife left him, pt states that he had a suicide attempt with a firearm which he placed in his mouth and pulled the trigger, but the firearm misfired. Pt states that the person who was with him at the time at that suicide attempt felt threatened and called the police on him which resulted in him being sent to prison because "in [place] if you have a gun and someone near you feels threatened, that is considered a felony". Pt states that in 2019, pt was diagnosed with colon cancer and states that the diagnosis was "devestating" to him because he had "always been healthy and active" and "now I need to have a colostomy bag?". 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 Pt states that "every time my life seems to be getting better, these little punches just keep screwing me up". In 2020 pt states that he was diagnosed with PE in both lungs and states that shortly after he was diagnosed with an "infarct"  in his heart. Pt complained of need to take "expensive medications" but that now he is "ok". In April 2021, pt had an accident at work where he slipped on water which caused a L5-S1 disc to slip and cause him to have right leg pain and right foot drop which requires him to use a cane to walk now.   Pt states that due to needing to use a cane to walk and his chronic pain, he is unable to get a job. Pt states that "everyone loves him" during the initial interview but never hire him because he uses the cane. Pt states that because he cant find a job and work, he cant make money. If he cant make money, he cant get insurance. If he can't get insurance, he cant see a doctor. And if he can't see a doctor, he will never get over the "source" of his pain and never get a job. Pt references this "circle that he is multiple times throughout the interview. Pt states that "I cant get out of this circle" and that "im screwed". Pt states that the event that "pushed him over the edge" was when the doctor who was prescribing his oxycodone was no longer going to perscribe oxycodone to him any longer and that he needed to see a neurologist or pain specialist. Pt states that he cannot afford to see these specialist and that caused him to attempt  to commit suicide. Pt states that he had the "perfect concoction of drugs and alcohol" to kill himself since he just refilled 2 months worth of medication that he intended to take all at once. When asked why he thinks this is the "perfect concoction" he states that he new doctors were always "nervous about prescribing benzos and opioids" because of "respiratory depression".  When asked about what he plans to do going forward, pt expresses that he "needs resources" but is unable to get them. He becomes very agitated when talking about previous experiences with government social workers. Pt expresses that he "wants to work" and "be productive". He states that recently he found out about work  from home options and that he is applying to those jobs which would be "perfect for me because of my disability". Pt states that "therapy is not going to help me". He states that he "just needs to get resources to help me" but that he "cant get to the people that I need". Pt states that when considering everything that he has been through in his life "any normal person would feel the same as I do". Pt states that his life goal is to be a psychiatric therapist or counselor. He states that this was his dream since his teenage years because he feels like "everyone always comes to me for help and advise". He states that "he wants to change someone's life with the experiences that I had in my own life".  When asked about the situation surrounding the suicide attempt pt states that "if I really wanted to go I wouldn't have posted it on facebook". When talking about resources available to help him cope, pt states that "if I just had someone to call, I wouldn't have gone through with it". Currently pt states that he feels "terrific now" and that he would not "go and kill himself if he was discharged right now". Pt does not currently have SI, HI, and contracts for safety."  Principal Problem: Suicide attempt by drug overdose [facility]) Discharge Diagnoses: Principal Problem:   Suicide attempt by drug overdose ([place]) Active Problems:   Bipolar I disorder with depression, severe ([place])   Past Psychiatric History: Bipolar Disorder, MDD, and Anxiety  Past Medical History:  Past Medical History:  Diagnosis Date  . Acute renal failure ([place]) 12/16/2019  . Anxiety    per pt report  . Colon cancer ([place])   . Crohn disease ([place])   . Depression    per pt report  . ETOH abuse 10/16/2019  . Foot drop, right   . Multiple subsegmental pulmonary emboli without acute cor pulmonale ([place]) 07/21/2019  . Myocardial infarction ([place])   . PE (pulmonary thromboembolism) ([place])   . PTSD (post-traumatic stress disorder)   . Thyroid  disease     Past Surgical History:  Procedure Laterality Date  . ABDOMINAL SURGERY    . COLON SURGERY    . IR RADIOLOGIST EVAL & MGMT  08/31/2019  . OSTOMY    . SMALL INTESTINE SURGERY     Family History: History reviewed. No pertinent family history. Family Psychiatric  History: Mother: Multiple personality disorder, Bipolar Sister: Multiple personality disorder, Bipolar Father: Depression Social History:  Social History   Substance and Sexual Activity  Alcohol Use Yes     Social History   Substance and Sexual Activity  Drug Use Not Currently    Social History   Socioeconomic History  . Marital status: Divorced    Spouse name: Not  on file  . Number of children: Not on file  . Years of education: Not on file  . Highest education level: Not on file  Occupational History  . Not on file  Tobacco Use  . Smoking status: Current Every Day Smoker    Packs/day: 1.00  . Smokeless tobacco: Never Used  Vaping Use  . Vaping Use: Never used  Substance and Sexual Activity  . Alcohol use: Yes  . Drug use: Not Currently  . Sexual activity: Yes  Other Topics Concern  . Not on file  Social History Narrative  . Not on file   Social Determinants of Health   Financial Resource Strain: Not on file  Food Insecurity: Not on file  Transportation Needs: Not on file  Physical Activity: Not on file  Stress: Not on file  Social Connections: Not on file    Hospital Course:  Patient presented on 1/19 to [facility] via ambulance due to intentional overdose on Flexeril, Seroquel, oxycodone, Klonopin, and a fifth of liqueur. He required intubation for one day and was in the ICU for 2 days before going to a medical floor. He was placed under IVC while in the ED. He was medically cleared and admitted to [facility] on 1/27. He was restarted on his home regiment and titrated up. He responded well to the medications without any side effects.  Patient remains at chronic high risk of suicide due to the  following- divorced, white, male, multiple complex medical issues. However, he reports that he has stabilized and improved while being here on the unit. He has not had any thoughts of SI for several days now. Today he reports no SI, HI, or AVH. He reports that his sleep has improved as well as his appetite. Discussed what to do in future crisis situations. First talked about the national suicide prevention number, he said he knew it but wanted something local. Discussed that the [facility] is always staffed 24/7 and he lives near it. He reported that he would use this resource if needed in the future. He reported that his sleep was good as well as his appetite. He was discharged home.  Physical Findings: AIMS: Facial and Oral Movements Muscles of Facial Expression: None, normal Lips and Perioral Area: None, normal Jaw: None, normal Tongue: None, normal,Extremity Movements Upper (arms, wrists, hands, fingers): None, normal Lower (legs, knees, ankles, toes): None, normal, Trunk Movements Neck, shoulders, hips: None, normal, Overall Severity Severity of abnormal movements (highest score from questions above): None, normal Incapacitation due to abnormal movements: None, normal Patient's awareness of abnormal movements (rate only patient's report): No Awareness, Dental Status Current problems with teeth and/or dentures?: Yes Does patient usually wear dentures?: No  CIWA:    COWS:     Musculoskeletal: Strength & Muscle Tone: within normal limits Gait & Station: normal Patient leans: N/A  Psychiatric Specialty Exam: Physical Exam Vitals and nursing note reviewed.  Constitutional:      General: He is not in acute distress.    Appearance: Normal appearance. He is obese. He is not ill-appearing, toxic-appearing or diaphoretic.  HENT:     Head: Normocephalic and atraumatic.  Cardiovascular:     Rate and Rhythm: Normal rate.  Pulmonary:     Effort: Pulmonary effort is normal.  Musculoskeletal:         General: Normal range of motion.  Neurological:     Mental Status: He is alert. Mental status is at baseline.     Review of Systems  Constitutional: Negative for fatigue and fever.  Respiratory: Negative for chest tightness and shortness of breath.   Cardiovascular: Negative for chest pain and palpitations.  Gastrointestinal: Negative for abdominal pain and vomiting.  Neurological: Negative for dizziness, light-headedness and headaches.  Psychiatric/Behavioral: Negative for self-injury, sleep disturbance and suicidal ideas.    Blood pressure 115/79, pulse (!) 102, temperature 97.8 F (36.6 C), temperature source Oral, resp. rate 18, height 6' 1"  (1.854 m), weight 116.5 kg, SpO2 97 %.Body mass index is 33.89 kg/m.  General Appearance: Casual  Eye Contact:  Good  Speech:  Clear and Coherent and Normal Rate  Volume:  Normal  Mood:  Euphoric  Affect:  Appropriate  Thought Process:  Coherent and Goal Directed  Orientation:  Full (Time, Place, and Person)  Thought Content:  Logical  Suicidal Thoughts:  No  Homicidal Thoughts:  No  Memory:  Immediate;   Good Recent;   Good  Judgement:  Fair  Insight:  Fair  Psychomotor Activity:  Normal  Concentration:  Concentration: Good and Attention Span: Good  Recall:  Good  Fund of Knowledge:  Good  Language:  Good  Akathisia:  No  Handed:  Right  AIMS (if indicated):     Assets:  Resilience  ADL's:  Intact  Cognition:  WNL  Sleep:  Number of Hours: 6.25     Have you used any form of tobacco in the last 30 days? (Cigarettes, Smokeless Tobacco, Cigars, and/or Pipes): Yes  Has this patient used any form of tobacco in the last 30 days? (Cigarettes, Smokeless Tobacco, Cigars, and/or Pipes) Yes, declined smoke cessation prescription  Blood Alcohol level:  Lab Results  Component Value Date   ETH 206 (H) 05/03/2020   ETH 242 (H) 12/87/8676    Metabolic Disorder Labs:  Lab Results  Component Value Date   HGBA1C 5.0 05/12/2020    MPG 96.8 05/12/2020   No results found for: PROLACTIN Lab Results  Component Value Date   CHOL 123 05/12/2020   TRIG 193 (H) 05/12/2020   HDL 29 (L) 05/12/2020   CHOLHDL 4.2 05/12/2020   VLDL 39 05/12/2020   LDLCALC 55 05/12/2020   [place] 67 05/05/2019    See Psychiatric Specialty Exam and Suicide Risk Assessment completed by Attending Physician prior to discharge.  Discharge destination:  Home  Is patient on multiple antipsychotic therapies at discharge:  No   Has Patient had three or more failed trials of antipsychotic monotherapy by history:  No  Recommended Plan for Multiple Antipsychotic Therapies: NA  Prescriptions given at discharge. Patient agreeable to plan. Given opportunity to ask questions. Appears to feel comfortable with discharge denies any current suicidal or homicidal thought.  Patient is also instructed prior to discharge to: Take all medications as prescribed by mental healthcare provider. Report any adverse effects and or reactions from the medicines to outpatient provider promptly. Patient has been instructed & cautioned: To not engage in alcohol and or illegal drug use while on prescription medicines. In the event of worsening symptoms,  patient is instructed to call the crisis hotline, 911 and or go to the nearest ED for appropriate evaluation and treatment of symptoms. To follow-up with primary care provider for other medical issues, concerns and or health care needs  The patient was evaluated each day by a clinical provider to ascertain response to treatment. Improvement was noted by the patient's report of decreasing symptoms, improved sleep and appetite, affect, medication tolerance, behavior, and participation in unit programming.  Patient was  asked each day to complete a self inventory noting mood, mental status, pain, new symptoms, anxiety and concerns.  Patient responded well to medication and being in a therapeutic and supportive environment. Positive  and appropriate behavior was noted and the patient was motivated for recovery. The patient worked closely with the treatment team and case manager to develop a discharge plan with appropriate goals. Coping skills, problem solving as well as relaxation therapies were also part of the unit programming.  By the day of discharge patient was in much improved condition than upon admission.  Symptoms were reported as significantly decreased or resolved completely. The patient denied SI/HI and voiced no AVH. The patient was motivated to continue taking medication with a goal of continued improvement in mental health.   Patient was discharged home with a plan to follow up as noted below.  Discharge Instructions    Diet - low sodium heart healthy   Complete by: As directed    Increase activity slowly   Complete by: As directed      Allergies as of 05/15/2020      Reactions   Azathioprine Other (See Comments)   Pt does not recall reaction    Ciprofloxacin Other (See Comments)   Pt does not recall reaction    Mesalamine Other (See Comments)   Pt does not recall reaction   Metronidazole Other (See Comments)   Pt does not recall reaction    Sulfa Antibiotics Other (See Comments)   Pt does not recall reaction    Barium Sulfate Rash   Barium-containing Compounds Rash      Medication List    STOP taking these medications   clonazePAM 0.25 MG disintegrating tablet Commonly known as: KLONOPIN   oxyCODONE 5 MG immediate release tablet Commonly known as: Oxy IR/ROXICODONE     TAKE these medications     Indication  divalproex 250 MG DR tablet Commonly known as: DEPAKOTE Take 3 tablets (750 mg total) by mouth at bedtime. What changed: You were already taking a medication with the same name, and this prescription was added. Make sure you understand how and when to take each.  Indication: Manic Phase of Manic-Depression   divalproex 500 MG DR tablet Commonly known as: DEPAKOTE Take 1 tablet  (500 mg total) by mouth daily. Start taking on: May 16, 2020 What changed: when to take this  Indication: Manic Phase of Manic-Depression   DULoxetine 60 MG capsule Commonly known as: Cymbalta Take 1 capsule (60 mg total) by mouth daily.  Indication: Major Depressive Disorder, Musculoskeletal Pain   gabapentin 300 MG capsule Commonly known as: NEURONTIN Take 2 capsules (600 mg total) by mouth 3 (three) times daily.  Indication: Neuropathic Pain   QUEtiapine 300 MG tablet Commonly known as: SEROQUEL Take 1 tablet (300 mg total) by mouth at bedtime.  Indication: Generalized Anxiety Disorder   QUEtiapine 50 MG tablet Commonly known as: SEROQUEL Take 1 tablet (50 mg total) by mouth daily at 6 (six) AM. Start taking on: May 16, 2020  Indication: Major Depressive Disorder       Follow-up Information    [place]. Go on 05/23/2020.   Specialty: Behavioral Health Why: You have a walk in appointment on 05/23/20 at 7:45 am for therapy services ( Walk in appointments are first come, first served and are held in person). You also have an appointment for medication management 06/07/20 at 9:00 am (VIRTUAL). Contact information: [place]  Follow-up recommendations:  - Activity as tolerated. - Diet as recommended by PCP. - Keep all scheduled follow-up appointments as recommended.  Comments:  Patient is instructed to take all prescribed medications as recommended. Report any side effects or adverse reactions to your outpatient psychiatrist. Patient is instructed to abstain from alcohol and illegal drugs while on prescription medications. In the event of worsening symptoms, patient is instructed to call the crisis hotline, 911, or go to the nearest emergency department for evaluation and treatment.  Signed: [name], MD 05/15/2020, 1:02 PM

## 2020-05-15 NOTE — Progress Notes (Signed)
Pt discharged to lobby. Pt was stable and appreciative at that time. All papers, samples and prescriptions were given and valuables returned. Verbal understanding expressed. Denies SI/HI and A/VH. Pt given opportunity to express concerns and ask questions.

## 2020-05-15 NOTE — Progress Notes (Signed)
Pt became argumentative this evening stating haws treated unfairly by being transferred to [address] hall from [address]. Pt was able to talk to [facility] about it. Will continue to monitor.

## 2020-05-15 NOTE — BHH Suicide Risk Assessment (Signed)
[facility] Discharge Suicide Risk Assessment   Principal Problem: Suicide attempt by drug overdose [facility]) Discharge Diagnoses: Principal Problem:   Suicide attempt by drug overdose ([place]) Active Problems:   Bipolar I disorder with depression, severe ([place])   Total Time spent with patient: 30 minutes  Musculoskeletal: Strength & Muscle Tone: within normal limits Gait & Station: unsteady Patient leans: N/A  Psychiatric Specialty Exam:   Blood pressure 115/79, pulse (!) 102, temperature 97.8 F (36.6 C), temperature source Oral, resp. rate 18, height 6' 1"  (1.854 m), weight 116.5 kg, SpO2 97 %.Body mass index is 33.89 kg/m.  General Appearance: Casual  Eye Contact::  Fair  Speech:  Clear and [id number]  Volume:  Normal  Mood:  Euthymic  Affect:  Appropriate  Thought Process:  Coherent  Orientation:  Full (Time, Place, and Person)  Thought Content:  Logical  Suicidal Thoughts:  No  Homicidal Thoughts:  No  Memory:  Recent;   Fair  Judgement:  Intact  Insight:  Fair  Psychomotor Activity:  Normal  Concentration:  Fair  Recall:  [employer] of Knowledge:Fair  Language: Fair  Akathisia:  No  Handed:  Right  AIMS (if indicated):     Assets:  Communication Skills Desire for Improvement Resilience  Sleep:  Number of Hours: 6.25  Cognition: WNL  ADL's:  Intact   Mental Status Per Nursing Assessment::   On Admission:  Suicidal ideation indicated by patient  Demographic Factors:  Male, Divorced or widowed, Caucasian, Low socioeconomic status, Living alone and Unemployed  Loss Factors: Loss of significant relationship and Decline in physical health  Historical Factors: Prior suicide attempts and Impulsivity  Risk Reduction Factors:   Positive coping skills or problem solving skills  Continued Clinical Symptoms:  Alcohol/Substance Abuse/Dependencies  Cognitive Features That Contribute To Risk:  None    Suicide Risk:  Mild:  Suicidal ideation of limited frequency,  intensity, duration, and specificity.  There are no identifiable plans, no associated intent, mild dysphoria and related symptoms, good self-control (both objective and subjective assessment), few other risk factors, and identifiable protective factors, including available and accessible social support.  Patient remains at chronic high risk of self harm due to numerus risk factors including, age, sex, race, medical commodities and prior attempts. He is adamate  that he is no longer experiencing any SI, and has maintained so for 3 days now, and is requesting to be discharge   [place]. Go on 05/23/2020.   Specialty: Behavioral Health Why: You have a walk in appointment on 05/23/20 at 7:45 am for therapy services ( Walk in appointments are first come, first served and are held in person). You also have an appointment for medication management 06/07/20 at 9:00 am (VIRTUAL). Contact information: [place] [phone]              Plan Of Care/Follow-up recommendations:  Other:  Follow-up with outpatient care  [name], MD 05/15/2020, 9:27 AM

## 2020-05-15 NOTE — Progress Notes (Signed)
Recreation Therapy Notes  Date: 1.31.22 Time: 1010 Location: 500 Hall Dayroom  Group Topic: Anxiety  Goal Area(s) Addresses:  Patient will identify triggers to anxiety. Patient will identify thought they experience when anxious. Patient will identify coping skills for dealing with anxiety.  Behavioral Response: Engaged  Intervention: Worksheet  Activity: Introduction to Anxiety.  Patients were to identify triggers that lead up to anxiety, thoughts they experience when anxious, physical symptoms they experience and coping skills they use to deal with anxiousness.  Education: Communication, Discharge Planning  Education Outcome: Acknowledges understanding/In group clarification offered/Needs additional education.   Clinical Observations/Feedback: Pt was engaged and active during group session.  Pt identified his triggers as women, financial instability and health problems.  Pt expressed his physical symptoms are sweaty palms, exaggerated breathing and irregular heart beat.  Pt identified thoughts he has when anxious as "can't wait till this is over, please someone help and gotta go somewhere else".  Pt expressed coping skills were breath slowly, take a klonopin or xanax or try to relax.    Victorino Sparrow, LRT/CTRS         Victorino Sparrow A 05/15/2020 12:16 PM

## 2020-05-15 NOTE — Progress Notes (Signed)
  [facility] Adult Case Management Discharge Plan :  Will you be returning to the same living situation after discharge:  Yes,  Home At discharge, do you have transportation home?: Yes,  Safe Transport  Do you have the ability to pay for your medications: Yes,  Friends  Release of information consent forms completed and in the chart;  Patient's signature needed at discharge.  Patient to Follow up at:  [place]. Go on 05/23/2020.   Specialty: Behavioral Health Why: You have a walk in appointment on 05/23/20 at 7:45 am for therapy services ( Walk in appointments are first come, first served and are held in person). You also have an appointment for medication management 06/07/20 at 9:00 am (VIRTUAL). Contact information: [place] [phone]              Next level of care provider has access to Real and Suicide Prevention discussed: Yes,  with patient   Have you used any form of tobacco in the last 30 days? (Cigarettes, Smokeless Tobacco, Cigars, and/or Pipes): Yes  Has patient been referred to the Quitline?: Patient refused referral  Patient has been referred for addiction treatment: [place], [place] 05/15/2020, 9:40 AM

## 2020-05-19 LAB — DRUG SCREEN 764883 11+OXYCO+ALC+CRT-BUND
Amphetamines, Urine: NEGATIVE ng/mL
BENZODIAZ UR QL: NEGATIVE ng/mL
Barbiturate: NEGATIVE ng/mL
Cocaine (Metabolite): NEGATIVE ng/mL
Creatinine: 361.1 mg/dL — ABNORMAL HIGH (ref 20.0–300.0)
Ethanol: NEGATIVE %
Meperidine: NEGATIVE ng/mL
Methadone Screen, Urine: NEGATIVE ng/mL
Phencyclidine: NEGATIVE ng/mL
Propoxyphene: NEGATIVE ng/mL
Tramadol: NEGATIVE ng/mL
pH, Urine: 6.2 (ref 4.5–8.9)

## 2020-05-19 LAB — OXYCODONE/OXYMORPHONE, CONFIRM
OXYCODONE/OXYMORPH: POSITIVE — AB
OXYCODONE: >3000 ng/mL
OXYCODONE: POSITIVE — AB
OXYMORPHONE (GC/MS): >3000 ng/mL
OXYMORPHONE: POSITIVE — AB

## 2020-05-19 LAB — CANNABINOID CONFIRMATION, UR
CANNABINOIDS: POSITIVE — AB
Carboxy THC GC/MS Conf: 40 ng/mL

## 2020-05-19 LAB — OPIATES CONFIRMATION, URINE: Opiates: NEGATIVE ng/mL

## 2020-05-31 ENCOUNTER — Ambulatory Visit: Payer: MEDICAID | Admitting: Diagnostic Neuroimaging

## 2020-06-07 ENCOUNTER — Telehealth (HOSPITAL_COMMUNITY): Payer: No Payment, Other | Admitting: Psychiatry

## 2020-06-07 ENCOUNTER — Other Ambulatory Visit: Payer: Self-pay

## 2020-06-09 ENCOUNTER — Other Ambulatory Visit (HOSPITAL_COMMUNITY): Payer: Self-pay | Admitting: Emergency Medicine

## 2020-06-09 ENCOUNTER — Encounter (HOSPITAL_COMMUNITY): Payer: Self-pay | Admitting: Emergency Medicine

## 2020-06-09 ENCOUNTER — Emergency Department (HOSPITAL_COMMUNITY)
Admission: EM | Admit: 2020-06-09 | Discharge: 2020-06-09 | Disposition: A | Discharge status: Home / Self Care | Payer: Self-pay | Attending: Emergency Medicine | Admitting: Emergency Medicine

## 2020-06-09 ENCOUNTER — Other Ambulatory Visit: Payer: Self-pay

## 2020-06-09 ENCOUNTER — Ambulatory Visit (HOSPITAL_COMMUNITY)
Admission: EM | Admit: 2020-06-09 | Discharge: 2020-06-09 | Disposition: A | Discharge status: Home / Self Care | Payer: Self-pay | Attending: Emergency Medicine | Admitting: Emergency Medicine

## 2020-06-09 ENCOUNTER — Encounter (HOSPITAL_COMMUNITY): Payer: Self-pay | Admitting: *Deleted

## 2020-06-09 DIAGNOSIS — G8929 Other chronic pain: Secondary | ICD-10-CM

## 2020-06-09 DIAGNOSIS — M545 Low back pain, unspecified: Secondary | ICD-10-CM

## 2020-06-09 DIAGNOSIS — Z881 Allergy status to other antibiotic agents status: Secondary | ICD-10-CM | POA: Insufficient documentation

## 2020-06-09 DIAGNOSIS — E876 Hypokalemia: Secondary | ICD-10-CM

## 2020-06-09 DIAGNOSIS — Z882 Allergy status to sulfonamides status: Secondary | ICD-10-CM | POA: Insufficient documentation

## 2020-06-09 DIAGNOSIS — R000 Tachycardia, unspecified: Secondary | ICD-10-CM | POA: Insufficient documentation

## 2020-06-09 DIAGNOSIS — F17200 Nicotine dependence, unspecified, uncomplicated: Secondary | ICD-10-CM | POA: Insufficient documentation

## 2020-06-09 DIAGNOSIS — Z85038 Personal history of other malignant neoplasm of large intestine: Secondary | ICD-10-CM | POA: Insufficient documentation

## 2020-06-09 DIAGNOSIS — M79661 Pain in right lower leg: Secondary | ICD-10-CM | POA: Insufficient documentation

## 2020-06-09 DIAGNOSIS — G894 Chronic pain syndrome: Secondary | ICD-10-CM | POA: Insufficient documentation

## 2020-06-09 DIAGNOSIS — F17210 Nicotine dependence, cigarettes, uncomplicated: Secondary | ICD-10-CM | POA: Insufficient documentation

## 2020-06-09 DIAGNOSIS — Z5900 Homelessness unspecified: Secondary | ICD-10-CM | POA: Insufficient documentation

## 2020-06-09 DIAGNOSIS — M549 Dorsalgia, unspecified: Secondary | ICD-10-CM | POA: Insufficient documentation

## 2020-06-09 DIAGNOSIS — M79673 Pain in unspecified foot: Secondary | ICD-10-CM | POA: Insufficient documentation

## 2020-06-09 LAB — BASIC METABOLIC PANEL WITH GFR
Anion gap: 15 (ref 5–15)
BUN: 8 mg/dL (ref 6–20)
CO2: 18 mmol/L — ABNORMAL LOW (ref 22–32)
Calcium: 9.5 mg/dL (ref 8.9–10.3)
Chloride: 104 mmol/L (ref 98–111)
Creatinine, Ser: 1.24 mg/dL (ref 0.61–1.24)
GFR, Estimated: >60 mL/min
Glucose, Bld: 111 mg/dL — ABNORMAL HIGH (ref 70–99)
Potassium: 2.7 mmol/L — CL (ref 3.5–5.1)
Sodium: 137 mmol/L (ref 135–145)

## 2020-06-09 LAB — CBC
HCT: 44.8 % (ref 39.0–52.0)
Hemoglobin: 16 g/dL (ref 13.0–17.0)
MCH: 32.7 pg (ref 26.0–34.0)
MCHC: 35.7 g/dL (ref 30.0–36.0)
MCV: 91.6 fL (ref 80.0–100.0)
Platelets: 251 10*3/uL (ref 150–400)
RBC: 4.89 MIL/uL (ref 4.22–5.81)
RDW: 13.2 % (ref 11.5–15.5)
WBC: 9.9 10*3/uL (ref 4.0–10.5)
nRBC: 0 % (ref 0.0–0.2)

## 2020-06-09 LAB — TROPONIN I (HIGH SENSITIVITY)
Troponin I (High Sensitivity): 4 ng/L (ref <18)
Troponin I (High Sensitivity): 4 ng/L

## 2020-06-09 MED ORDER — SODIUM CHLORIDE 0.9 % IV BOLUS: 1000.0000 mL | Freq: Once | INTRAVENOUS | Status: DC

## 2020-06-09 MED ORDER — POTASSIUM CHLORIDE 10 MEQ/100ML IV SOLN: 10.0000 meq | Freq: Once | INTRAVENOUS | Status: DC

## 2020-06-09 MED ORDER — POTASSIUM CHLORIDE ER 10 MEQ PO TBCR: 10.0000 meq | EXTENDED_RELEASE_TABLET | Freq: Two times a day (BID) | ORAL | 0 refills | Status: DC

## 2020-06-09 MED ORDER — POTASSIUM CHLORIDE CRYS ER 20 MEQ PO TBCR
40.0000 meq | EXTENDED_RELEASE_TABLET | Freq: Once | ORAL | Status: AC
  Administered 2020-06-09: 40 meq via ORAL
  Filled 2020-06-09: qty 2

## 2020-06-09 MED ORDER — ONDANSETRON 4 MG PO TBDP
4.0000 mg | ORAL_TABLET | Freq: Once | ORAL | Status: AC
  Administered 2020-06-09: 4 mg via ORAL
  Filled 2020-06-09: qty 1

## 2020-06-09 MED ORDER — ONDANSETRON 4 MG PO TBDP
4.0000 mg | ORAL_TABLET | Freq: Once | ORAL | Status: AC | PRN
  Administered 2020-06-09: 4 mg via ORAL
  Filled 2020-06-09: qty 1

## 2020-06-09 NOTE — Discharge Instructions (Addendum)
Follow up with your primary care provider if your symptoms are not improving.

## 2020-06-09 NOTE — ED Triage Notes (Addendum)
Pt sent here from [facility] for chronic pain to R leg and R foot.  Pain is to the point where he is nauseated from the pain.  HR 130.

## 2020-06-09 NOTE — ED Provider Notes (Signed)
[facility] EMERGENCY DEPARTMENT Provider Note   CSN: [id number] Arrival date & time: 06/09/20  1729     History Chief Complaint  Patient presents with  . Leg Pain    Juan Hester is a 40 y.o. male.  Patient presents to the ED with a chief complaint of acute on chronic foot pain and back pain.  Prior history of drug overdose, so his doctor no longer prescribing narcotics.  He states that he is attempting to get [facility] medicaid, but has thus far been unsuccessful.  He has tried prednisone for his reported history of herniated disc.  He states that this hasn't helped at all.  He states that the pain radiates from his back to his leg/foot.  He states that none of the symptoms are new, but his pain is worse today.  The history is provided by the patient. No language interpreter was used.       Past Medical History:  Diagnosis Date  . Acute renal failure ([place]) 12/16/2019  . Anxiety    per pt report  . Colon cancer ([place])   . Crohn disease ([place])   . Depression    per pt report  . ETOH abuse 10/16/2019  . Foot drop, right   . Multiple subsegmental pulmonary emboli without acute cor pulmonale ([place]) 07/21/2019  . Myocardial infarction ([place])   . PE (pulmonary thromboembolism) ([place])   . PTSD (post-traumatic stress disorder)   . Thyroid disease     Patient Active Problem List   Diagnosis Date Noted  . Suicide attempt by drug overdose ([place]) 05/12/2020  . Bipolar I disorder with depression, severe ([place]) 05/10/2020  . Overdose 05/03/2020  . Chronic pain syndrome 01/31/2020  . Homelessness 12/27/2019  . Colostomy status ([place]) 12/16/2019  . History of pulmonary embolism 12/16/2019  . Right foot drop 12/15/2019  . GAD (generalized anxiety disorder) 10/20/2019  . MDD (major depressive disorder) 10/20/2019  . PTSD (post-traumatic stress disorder) 10/20/2019  . Bipolar affective disorder, currently depressed, mild ([place]) 10/16/2019  . Protrusion of lumbar intervertebral disc  10/05/2019  . Bipolar 1 disorder ([place]) 07/21/2019  . History of colon cancer 07/21/2019  . Schizoaffective disorder, bipolar type ([place]) 06/03/2019  . Nicotine dependence, cigarettes, uncomplicated 96/78/9381  . Crohn's disease with complication ([place]) 01/75/1025    Past Surgical History:  Procedure Laterality Date  . ABDOMINAL SURGERY    . COLON SURGERY    . IR RADIOLOGIST EVAL & MGMT  08/31/2019  . OSTOMY    . SMALL INTESTINE SURGERY         No family history on file.  Social History   Tobacco Use  . Smoking status: Current Every Day Smoker    Packs/day: 1.00  . Smokeless tobacco: Never Used  Vaping Use  . Vaping Use: Never used  Substance Use Topics  . Alcohol use: Yes    Comment: weekend  . Drug use: Not Currently    Home Medications Prior to Admission medications   Medication Sig Start Date End Date Taking? Authorizing Provider  divalproex (DEPAKOTE) 250 MG DR tablet Take 3 tablets (750 mg total) by mouth at bedtime. 05/15/20 06/14/20  [name], MD  divalproex (DEPAKOTE) 500 MG DR tablet Take 1 tablet (500 mg total) by mouth daily. 05/16/20 06/15/20  [name], MD  DULoxetine (CYMBALTA) 60 MG capsule Take 1 capsule (60 mg total) by mouth daily. 04/19/20   Salley Slaughter, NP  gabapentin (NEURONTIN) 300 MG capsule Take 2  capsules (600 mg total) by mouth 3 (three) times daily. 05/10/20   Amin, Jeanella Flattery, MD  QUEtiapine (SEROQUEL) 300 MG tablet Take 1 tablet (300 mg total) by mouth at bedtime. 05/15/20 06/14/20  [name], MD  QUEtiapine (SEROQUEL) 50 MG tablet Take 1 tablet (50 mg total) by mouth daily at 6 (six) AM. 05/16/20 06/15/20  [name], MD    Allergies    Azathioprine, Ciprofloxacin, Mesalamine, Metronidazole, Sulfa antibiotics, Barium sulfate, and Barium-containing compounds  Review of Systems   Review of Systems  All other systems reviewed and are negative.   Physical Exam Updated Vital Signs BP 117/84   Pulse  (!) 102   Temp 98.1 F (36.7 C) (Oral)   Resp 16   Ht 6' 1"  (1.854 m)   Wt 106.6 kg   SpO2 96%   BMI 31.00 kg/m   Physical Exam Vitals and nursing note reviewed.  Constitutional:      Appearance: He is well-developed and well-nourished.  HENT:     Head: Normocephalic and atraumatic.  Eyes:     Conjunctiva/sclera: Conjunctivae normal.  Cardiovascular:     Rate and Rhythm: Regular rhythm. Tachycardia present.     Heart sounds: No murmur heard.   Pulmonary:     Effort: Pulmonary effort is normal. No respiratory distress.     Breath sounds: Normal breath sounds.  Abdominal:     Palpations: Abdomen is soft.     Tenderness: There is no abdominal tenderness.  Musculoskeletal:        General: No edema.     Cervical back: Neck supple.     Comments: Moves all extremities  Skin:    General: Skin is warm and dry.     Comments: A few small blisters on bilateral shins  Neurological:     Mental Status: He is alert and oriented to person, place, and time.     Comments: Normal sensation of lower extremities Ambulatory with antalgic gait  Psychiatric:        Mood and Affect: Mood and affect and mood normal.        Behavior: Behavior normal.     ED Results / Procedures / Treatments   Labs (all labs ordered are listed, but only abnormal results are displayed) Labs Reviewed  BASIC METABOLIC PANEL - Abnormal; Notable for the following components:      Result Value   Potassium 2.7 (*)    CO2 18 (*)    Glucose, Bld 111 (*)    All other components within normal limits  CBC  TROPONIN I (HIGH SENSITIVITY)  TROPONIN I (HIGH SENSITIVITY)    EKG EKG Interpretation  Date/Time:  Friday June 09 2020 18:16:15 EST Ventricular Rate:  129 PR Interval:  140 QRS Duration: 84 QT Interval:  312 QTC Calculation: 457 R Axis:   -2 Text Interpretation: Sinus tachycardia Otherwise normal ECG Sinus tachycardia, no change from previous Confirmed by Lavenia Atlas [phone]) on 06/09/2020  8:39:27 PM   Radiology No results found.  Procedures Procedures   Medications Ordered in ED Medications  ondansetron (ZOFRAN-ODT) disintegrating tablet 4 mg (has no administration in time range)  potassium chloride SA (KLOR-CON) CR tablet 40 mEq (has no administration in time range)  ondansetron (ZOFRAN-ODT) disintegrating tablet 4 mg (4 mg Oral Given 06/09/20 1816)    ED Course  I have reviewed the triage vital signs and the nursing notes.  Pertinent labs & imaging results that were available during my care of the patient  were reviewed by me and considered in my medical decision making (see chart for details).    MDM Rules/Calculators/A&P                          Patient here with chronic pain.  He is noted to be tachycardic.  I do think that opioid withdrawal could be contributing to his tachycardia.  He is hypokalemic, will replace here and give RX for some K-dur.  No signs of cellulitis or septic joint.  He has good pulses and good cap refill.  Don't ischemic foot.  Gout is not ruled out, but patient doesn't have hx of gout.  I have encouraged him to follow-up closely with ortho/sports medicine.  He is meeting with an attorney and working on getting [place] medicaid so that he can be seen by specialists in the future. Final Clinical Impression(s) / ED Diagnoses Final diagnoses:  Chronic pain of right lower extremity  Chronic right-sided low back pain, unspecified whether sciatica present  Hypokalemia    Rx / DC Orders ED Discharge Orders         Ordered    potassium chloride (KLOR-CON) 10 MEQ tablet  2 times daily        06/09/20 2326           Montine Circle, PA-C 06/09/20 2327    Mesner, Corene Cornea, MD 06/10/20 [phone]

## 2020-06-09 NOTE — ED Triage Notes (Signed)
Pt presents with lower back pain and right "foot drop" xs 1 week. States has hx of back pain and nerve damage.

## 2020-06-09 NOTE — ED Notes (Signed)
Pt ambulatory around lobby.

## 2020-06-09 NOTE — ED Provider Notes (Signed)
[facility]    CSN: [id number] Arrival date & time: 06/09/20  1450      History   Chief Complaint Chief Complaint  Patient presents with  . Back Pain  . Foot Pain    HPI Juan Hester is a 40 y.o. male.   Patient presents with ongoing chronic low back pain and right foot drop.  He states his pain is worse for the past week.  No recent falls or injury.  He states he contacted his doctor who will no longer prescribe him narcotics due to his recent history of overdose.  His medical history includes right foot drop, Crohn's disease, colon CA, colostomy, history of pulmonary embolism, protrusion of lumbar disc, chronic pain syndrome, schizoaffective disorder, bipolar 1 disorder, anxiety, depression, PTSD, cigarette smoker, suicidal attempt by drug overdose, homelessness.  The history is provided by the patient and medical records.    Past Medical History:  Diagnosis Date  . Acute renal failure ([place]) 12/16/2019  . Anxiety    per pt report  . Colon cancer ([place])   . Crohn disease ([place])   . Depression    per pt report  . ETOH abuse 10/16/2019  . Foot drop, right   . Multiple subsegmental pulmonary emboli without acute cor pulmonale ([place]) 07/21/2019  . Myocardial infarction ([place])   . PE (pulmonary thromboembolism) ([place])   . PTSD (post-traumatic stress disorder)   . Thyroid disease     Patient Active Problem List   Diagnosis Date Noted  . Suicide attempt by drug overdose ([place]) 05/12/2020  . Bipolar I disorder with depression, severe ([place]) 05/10/2020  . Overdose 05/03/2020  . Chronic pain syndrome 01/31/2020  . Homelessness 12/27/2019  . Colostomy status ([place]) 12/16/2019  . History of pulmonary embolism 12/16/2019  . Right foot drop 12/15/2019  . GAD (generalized anxiety disorder) 10/20/2019  . MDD (major depressive disorder) 10/20/2019  . PTSD (post-traumatic stress disorder) 10/20/2019  . Bipolar affective disorder, currently depressed, mild ([place]) 10/16/2019  .  Protrusion of lumbar intervertebral disc 10/05/2019  . Bipolar 1 disorder ([place]) 07/21/2019  . History of colon cancer 07/21/2019  . Schizoaffective disorder, bipolar type ([place]) 06/03/2019  . Nicotine dependence, cigarettes, uncomplicated 02/77/4128  . Crohn's disease with complication ([place]) 78/67/6720    Past Surgical History:  Procedure Laterality Date  . ABDOMINAL SURGERY    . COLON SURGERY    . IR RADIOLOGIST EVAL & MGMT  08/31/2019  . OSTOMY    . SMALL INTESTINE SURGERY         Home Medications    Prior to Admission medications   Medication Sig Start Date End Date Taking? Authorizing Provider  divalproex (DEPAKOTE) 250 MG DR tablet Take 3 tablets (750 mg total) by mouth at bedtime. 05/15/20 06/14/20  [name], MD  divalproex (DEPAKOTE) 500 MG DR tablet Take 1 tablet (500 mg total) by mouth daily. 05/16/20 06/15/20  [name], MD  DULoxetine (CYMBALTA) 60 MG capsule Take 1 capsule (60 mg total) by mouth daily. 04/19/20   Salley Slaughter, NP  gabapentin (NEURONTIN) 300 MG capsule Take 2 capsules (600 mg total) by mouth 3 (three) times daily. 05/10/20   Amin, Jeanella Flattery, MD  QUEtiapine (SEROQUEL) 300 MG tablet Take 1 tablet (300 mg total) by mouth at bedtime. 05/15/20 06/14/20  [name], MD  QUEtiapine (SEROQUEL) 50 MG tablet Take 1 tablet (50 mg total) by mouth daily at 6 (six) AM. 05/16/20 06/15/20  Pashayan, Redgie Grayer, MD  Family History History reviewed. No pertinent family history.  Social History Social History   Tobacco Use  . Smoking status: Current Every Day Smoker    Packs/day: 1.00  . Smokeless tobacco: Never Used  Vaping Use  . Vaping Use: Never used  Substance Use Topics  . Alcohol use: Yes  . Drug use: Not Currently     Allergies   Azathioprine, Ciprofloxacin, Mesalamine, Metronidazole, Sulfa antibiotics, Barium sulfate, and Barium-containing compounds   Review of Systems Review of Systems  Constitutional: Negative  for chills and fever.  HENT: Negative for ear pain and sore throat.   Eyes: Negative for pain and visual disturbance.  Respiratory: Negative for cough and shortness of breath.   Cardiovascular: Negative for chest pain and palpitations.  Gastrointestinal: Negative for abdominal pain and vomiting.  Genitourinary: Negative for dysuria and hematuria.  Musculoskeletal: Positive for back pain. Negative for arthralgias.  Skin: Negative for color change and rash.  Neurological: Negative for seizures and syncope.  All other systems reviewed and are negative.    Physical Exam Triage Vital Signs ED Triage Vitals  Enc Vitals Group     BP 06/09/20 1604 125/78     Pulse Rate 06/09/20 1604 (!) 127     Resp 06/09/20 1604 19     Temp 06/09/20 1604 97.9 F (36.6 C)     Temp Source 06/09/20 1604 Oral     SpO2 06/09/20 1604 98 %     Weight --      Height --      Head Circumference --      Peak Flow --      Pain Score 06/09/20 1600 10     Pain Loc --      Pain Edu? --      Excl. in [place]? --    No data found.  Updated Vital Signs BP 125/78 (BP Location: Right Arm)   Pulse (!) 127   Temp 97.9 F (36.6 C) (Oral)   Resp 19   SpO2 98%   Visual Acuity Right Eye Distance:   Left Eye Distance:   Bilateral Distance:    Right Eye Near:   Left Eye Near:    Bilateral Near:     Physical Exam Vitals and nursing note reviewed.  Constitutional:      General: He is not in acute distress.    Appearance: He is well-developed and well-nourished. He is not ill-appearing.  HENT:     Head: Normocephalic and atraumatic.     Mouth/Throat:     Mouth: Mucous membranes are moist.  Eyes:     Conjunctiva/sclera: Conjunctivae normal.  Cardiovascular:     Rate and Rhythm: Normal rate and regular rhythm.     Heart sounds: Normal heart sounds.  Pulmonary:     Effort: Pulmonary effort is normal. No respiratory distress.     Breath sounds: Normal breath sounds.  Abdominal:     Palpations: Abdomen is  soft.     Tenderness: There is no abdominal tenderness.  Musculoskeletal:        General: No edema.     Cervical back: Neck supple.  Skin:    General: Skin is warm and dry.     Findings: No bruising, erythema, lesion or rash.  Neurological:     General: No focal deficit present.     Mental Status: He is alert and oriented to person, place, and time.     Comments: Ambulatory with cane.  Psychiatric:  Mood and Affect: Mood and affect and mood normal.        Behavior: Behavior normal.      UC Treatments / Results  Labs (all labs ordered are listed, but only abnormal results are displayed) Labs Reviewed - No data to display  EKG   Radiology No results found.  Procedures Procedures (including critical care time)  Medications Ordered in UC Medications - No data to display  Initial Impression / Assessment and Plan / UC Course  I have reviewed the triage vital signs and the nursing notes.  Pertinent labs & imaging results that were available during my care of the patient were reviewed by me and considered in my medical decision making (see chart for details).   Chronic low back pain.  Discussed with patient that we do not treat chronic pain here at an urgent care.  Also discussed that, given his recent history of suicide attempt by drug overdose, he will need to follow-up with his PCP to discuss pain medications.  Patient agrees to plan of care.   Final Clinical Impressions(s) / UC Diagnoses   Final diagnoses:  Chronic bilateral low back pain, unspecified whether sciatica present     Discharge Instructions     Follow up with your primary care provider if your symptoms are not improving.        ED Prescriptions    None     I have reviewed the PDMP during this encounter.   Sharion Balloon, NP 06/09/20 1714

## 2020-06-10 MED FILL — POTASSIUM CHLORIDE CRYS ER: 10 | 3 days supply | Qty: 6 | Fill #0

## 2020-06-12 ENCOUNTER — Telehealth: Payer: Self-pay | Admitting: *Deleted

## 2020-06-12 NOTE — Telephone Encounter (Signed)
Transition Care Management Follow-up Telephone Call  Date of discharge and from where: 06/09/2020 [facility]  How have you been since you were released from the hospital?  "Still in lots of pain"  Any questions or concerns? No  Items Reviewed:  Did the pt receive and understand the discharge instructions provided? Yes   Medications obtained and verified? Yes   Other? No   Any new allergies since your discharge? No   Dietary orders reviewed? Yes  Do you have support at home? Yes   Home Care and Equipment/Supplies: Were home health services ordered? not applicable If so, what is the name of the agency? N/A  Has the agency set up a time to come to the patient's home? not applicable Were any new equipment or medical supplies ordered?  No What is the name of the medical supply agency? N/A Were you able to get the supplies/equipment? not applicable Do you have any questions related to the use of the equipment or supplies? No  Functional Questionnaire: (I = Independent and D = Dependent) ADLs: I  Bathing/Dressing- I  Meal Prep- I  Eating- I  Maintaining continence- I  Transferring/Ambulation- I  Managing Meds- I  Follow up appointments reviewed:   PCP Hospital f/u appt confirmed? No    Specialist Hospital f/u appt confirmed? No    Are transportation arrangements needed? No   If their condition worsens, is the pt aware to call PCP or go to the Emergency Dept.? Yes  Was the patient provided with contact information for the PCP's office or ED? Yes  Was to pt encouraged to call back with questions or concerns? Yes

## 2020-06-15 ENCOUNTER — Telehealth (HOSPITAL_COMMUNITY): Payer: No Payment, Other | Admitting: Psychiatry

## 2020-06-15 DIAGNOSIS — M5126 Other intervertebral disc displacement, lumbar region: Secondary | ICD-10-CM

## 2020-06-15 DIAGNOSIS — M21371 Foot drop, right foot: Secondary | ICD-10-CM

## 2020-07-09 ENCOUNTER — Encounter (HOSPITAL_COMMUNITY): Payer: Self-pay

## 2020-07-09 ENCOUNTER — Other Ambulatory Visit: Payer: Self-pay

## 2020-07-09 ENCOUNTER — Emergency Department (HOSPITAL_COMMUNITY)
Admission: EM | Admit: 2020-07-09 | Discharge: 2020-07-09 | Disposition: A | Discharge status: Home / Self Care | Payer: Self-pay | Attending: Emergency Medicine | Admitting: Emergency Medicine

## 2020-07-09 DIAGNOSIS — M545 Low back pain, unspecified: Secondary | ICD-10-CM | POA: Insufficient documentation

## 2020-07-09 DIAGNOSIS — Z85038 Personal history of other malignant neoplasm of large intestine: Secondary | ICD-10-CM | POA: Insufficient documentation

## 2020-07-09 DIAGNOSIS — Z433 Encounter for attention to colostomy: Secondary | ICD-10-CM

## 2020-07-09 DIAGNOSIS — Z79899 Other long term (current) drug therapy: Secondary | ICD-10-CM | POA: Insufficient documentation

## 2020-07-09 DIAGNOSIS — K9409 Other complications of colostomy: Secondary | ICD-10-CM | POA: Insufficient documentation

## 2020-07-09 DIAGNOSIS — F17210 Nicotine dependence, cigarettes, uncomplicated: Secondary | ICD-10-CM | POA: Insufficient documentation

## 2020-07-09 DIAGNOSIS — G8929 Other chronic pain: Secondary | ICD-10-CM | POA: Insufficient documentation

## 2020-07-09 MED ORDER — OXYCODONE-ACETAMINOPHEN 5-325 MG PO TABS
1.0000 | ORAL_TABLET | Freq: Once | ORAL | Status: AC
  Administered 2020-07-09: 1 via ORAL
  Filled 2020-07-09: qty 1

## 2020-07-09 NOTE — ED Provider Notes (Signed)
[facility] DEPT Provider Note   CSN: [id number] Arrival date & time: 07/09/20  1922     History Chief Complaint  Patient presents with  . ostomy complication    Juan Hester is a 40 y.o. male.  Juan Hester is a 40 y.o. male with a history of colon cancer s/p colostomy, MI, PE, substance abuse, thyroid disease, Crohn's disease, who presents to the emergency department via EMS from home for issues with his ostomy bag.  Patient reports that he has stool leaking around his ostomy site and has not been able to get the bag to seal.  He reports that he has a small fissure surrounding his ostomy that causes fluid buildup beneath the seal and makes the ostomy seals that he uses not last as long.  He reports that he is running out of these and does not get a new shipment until the end of next week.  He has been working on getting follow-up for this and has an upcoming appointment with the wound clinic but in the meantime is running out of seals for his ostomy bag and is here today.  He has been trying to stretch these for as long as possible by using things like duct tape but this tends to irritate the skin around his ostomy site.  He reports normal output for him from his ostomy and no abdominal pain or pain at the ostomy site.  No fevers or chills.  He also reports chronic right low back pain radiating into his leg which has been an ongoing issue due to herniated disc, he was previously on chronic pain medication, is in the process of working on getting disability and [place] Medicaid but in the meantime has not been able to follow-up with a pain specialist and has been without any pain medication, he is going to have to walk home and asking if he could have a single dose to help make it easier for him to get home today, has plans to follow-up with his primary care doctor in the wound clinic.        Past Medical History:  Diagnosis Date  . Acute renal failure ([place])  12/16/2019  . Anxiety    per pt report  . Colon cancer ([place])   . Crohn disease ([place])   . Depression    per pt report  . ETOH abuse 10/16/2019  . Foot drop, right   . Multiple subsegmental pulmonary emboli without acute cor pulmonale ([place]) 07/21/2019  . Myocardial infarction ([place])   . PE (pulmonary thromboembolism) ([place])   . PTSD (post-traumatic stress disorder)   . Thyroid disease     Patient Active Problem List   Diagnosis Date Noted  . Suicide attempt by drug overdose ([place]) 05/12/2020  . Bipolar I disorder with depression, severe ([place]) 05/10/2020  . Overdose 05/03/2020  . Chronic pain syndrome 01/31/2020  . Homelessness 12/27/2019  . Colostomy status ([place]) 12/16/2019  . History of pulmonary embolism 12/16/2019  . Right foot drop 12/15/2019  . GAD (generalized anxiety disorder) 10/20/2019  . MDD (major depressive disorder) 10/20/2019  . PTSD (post-traumatic stress disorder) 10/20/2019  . Bipolar affective disorder, currently depressed, mild ([place]) 10/16/2019  . Protrusion of lumbar intervertebral disc 10/05/2019  . Bipolar 1 disorder ([place]) 07/21/2019  . History of colon cancer 07/21/2019  . Schizoaffective disorder, bipolar type ([place]) 06/03/2019  . Nicotine dependence, cigarettes, uncomplicated 38/93/7342  . Crohn's disease with complication ([place]) 87/68/1157    Past Surgical History:  Procedure Laterality Date  . ABDOMINAL SURGERY    . COLON SURGERY    . IR RADIOLOGIST EVAL & MGMT  08/31/2019  . OSTOMY    . SMALL INTESTINE SURGERY         No family history on file.  Social History   Tobacco Use  . Smoking status: Current Every Day Smoker    Packs/day: 1.00  . Smokeless tobacco: Never Used  Vaping Use  . Vaping Use: Never used  Substance Use Topics  . Alcohol use: Yes    Comment: weekend  . Drug use: Not Currently    Home Medications Prior to Admission medications   Medication Sig Start Date End Date Taking? Authorizing Provider  divalproex (DEPAKOTE) 250  MG DR tablet Take 3 tablets (750 mg total) by mouth at bedtime. 05/15/20 06/14/20  [name], MD  divalproex (DEPAKOTE) 500 MG DR tablet Take 1 tablet (500 mg total) by mouth daily. 05/16/20 06/15/20  [name], MD  DULoxetine (CYMBALTA) 60 MG capsule Take 1 capsule (60 mg total) by mouth daily. 04/19/20   Salley Slaughter, NP  gabapentin (NEURONTIN) 300 MG capsule Take 2 capsules (600 mg total) by mouth 3 (three) times daily. 05/10/20   Amin, Jeanella Flattery, MD  potassium chloride (KLOR-CON) 10 MEQ tablet Take 1 tablet (10 mEq total) by mouth 2 (two) times daily. 06/09/20   Montine Circle, PA-C  QUEtiapine (SEROQUEL) 300 MG tablet Take 1 tablet (300 mg total) by mouth at bedtime. 05/15/20 06/14/20  [name], MD  QUEtiapine (SEROQUEL) 50 MG tablet Take 1 tablet (50 mg total) by mouth daily at 6 (six) AM. 05/16/20 06/15/20  [name], MD    Allergies    Azathioprine, Ciprofloxacin, Mesalamine, Metronidazole, Sulfa antibiotics, Barium sulfate, and Barium-containing compounds  Review of Systems   Review of Systems  Constitutional: Negative for chills and fever.  Gastrointestinal: Negative for abdominal pain.       Ostomy issue  Musculoskeletal: Positive for back pain.  All other systems reviewed and are negative.   Physical Exam Updated Vital Signs BP (!) 146/103 (BP Location: Right Arm)   Pulse (!) 103   Temp 98.2 F (36.8 C) (Oral)   Resp 15   Ht 6' 1"  (1.854 m)   Wt 106.6 kg   SpO2 97%   BMI 31.01 kg/m   Physical Exam Vitals and nursing note reviewed.  Constitutional:      General: He is not in acute distress.    Appearance: Normal appearance. He is well-developed. He is not diaphoretic.     Comments: Well-appearing and in no distress   HENT:     Head: Normocephalic and atraumatic.  Eyes:     General:        Right eye: No discharge.        Left eye: No discharge.  Cardiovascular:     Rate and Rhythm: Normal rate.  Pulmonary:      Effort: Pulmonary effort is normal. No respiratory distress.  Abdominal:     General: Bowel sounds are normal. There is no distension.     Palpations: Abdomen is soft. There is no mass.     Tenderness: There is no abdominal tenderness.     Comments: Ostomy present in the left lower quadrant, stool leaking from the back wound no longer seal, small fissure just medial to the ostomy site that has been an ongoing issue, no tenderness over the abdomen urostomy and normal output noted.  Skin:    General: Skin is warm and dry.  Neurological:     Mental Status: He is alert.     Coordination: Coordination normal.     Comments: Speech is clear, able to follow commands Moves extremities without ataxia, coordination intact Ambulatory without difficulty  Psychiatric:        Behavior: Behavior normal.     ED Results / Procedures / Treatments   Labs (all labs ordered are listed, but only abnormal results are displayed) Labs Reviewed - No data to display  EKG None  Radiology No results found.  Procedures Procedures   Medications Ordered in ED Medications  oxyCODONE-acetaminophen (PERCOCET/ROXICET) 5-325 MG per tablet 1 tablet (1 tablet Oral Given 07/09/20 2014)    ED Course  I have reviewed the triage vital signs and the nursing notes.  Pertinent labs & imaging results that were available during my care of the patient were reviewed by me and considered in my medical decision making (see chart for details).    MDM Rules/Calculators/A&P                         40 year old male presents with colostomy bag issue.  Reports he has been having difficulty getting seals to last due to small fissure in the skin, and has been having stool leaking and has run out of Seals, has plenty of bags at home, but does not get a new shipment until the end of next week.  Feels they are not lasting as many days as usual.  He has upcoming follow-up with wound clinic regarding this.  No abdominal pain, normal  ostomy output.  Patient given new bag and seal and a few additional seals to help last him over the next few days until he gets additional supplies.  Patient with chronic right back pain that is unchanged but he has been without any pain medication until he can and is requesting something for pain to help as he will have to walk home, given a one-time dose of pain medication here but discussed that we will need to follow-up with his doctors for continued pain management.  He expresses understanding and agreement with plan.  Discharged home in good condition.  Final Clinical Impression(s) / ED Diagnoses Final diagnoses:  Colostomy care [facility])  Chronic right-sided low back pain, unspecified whether sciatica present    Rx / DC Orders ED Discharge Orders    None       Janet Berlin 07/09/20 2049    Sherwood Gambler, MD 07/09/20 2223

## 2020-07-09 NOTE — ED Triage Notes (Signed)
Pt to ED by EMS from home with c/o leaking ostomy around site. Arrives A+O, VSS, NADN.

## 2020-07-09 NOTE — Discharge Instructions (Signed)
Follow-up with wound care clinic regarding ostomy as planned.

## 2020-07-10 ENCOUNTER — Ambulatory Visit: Payer: Self-pay | Attending: Critical Care Medicine | Admitting: Physical Therapy

## 2020-07-19 ENCOUNTER — Other Ambulatory Visit (HOSPITAL_COMMUNITY): Payer: Self-pay

## 2020-08-14 ENCOUNTER — Other Ambulatory Visit: Payer: Self-pay | Admitting: Critical Care Medicine

## 2020-08-14 ENCOUNTER — Encounter (HOSPITAL_BASED_OUTPATIENT_CLINIC_OR_DEPARTMENT_OTHER): Payer: Self-pay | Admitting: Internal Medicine

## 2020-08-14 NOTE — Telephone Encounter (Signed)
Requested medication (s) are due for refill today:   Provider to review for both  Requested medication (s) are on the active medication list:   Yes for both  Future visit scheduled:   No   Last ordered: Klonopin 12/29/2019 #60, 2 refills;     Oxycodone  Returned because both of these are non delegated refills    Requested Prescriptions  Pending Prescriptions Disp Refills   clonazePAM (KLONOPIN) 0.5 MG tablet 60 tablet 2    Sig: TAKE 1/2 TABLET BY MOUTH TWO TIMES DAILY      Not Delegated - Psychiatry:  Anxiolytics/Hypnotics Failed - 08/14/2020  5:34 AM      Failed - This refill cannot be delegated      Passed - Urine Drug Screen completed in last 360 days      Passed - Valid encounter within last 6 months    Recent Outpatient Visits           3 months ago Chronic pain syndrome   [place] [name], MD   6 months ago Lumbar radiculopathy   [place] [name], MD   7 months ago Acute renal failure, unspecified acute renal failure type [facility])   [place] [name], MD   8 months ago Lumbar radiculopathy   [place] [name], MD   8 months ago Lumbar radiculopathy   [place] [name], MD                  oxyCODONE (ROXICODONE) 15 MG immediate release tablet 40 tablet 0    Sig: TAKE 1 TABLET (15 MG TOTAL) BY MOUTH EVERY 6 (SIX) HOURS AS NEEDED.      Not Delegated - Analgesics:  Opioid Agonists Failed - 08/14/2020  5:34 AM      Failed - This refill cannot be delegated      Passed - Urine Drug Screen completed in last 360 days      Passed - Valid encounter within last 6 months    Recent Outpatient Visits           3 months ago Chronic pain syndrome   [place] [name], MD   6 months ago Lumbar radiculopathy   [place] [name], MD   7 months ago Acute renal failure, unspecified acute renal failure type [facility])   [place], Patrick E, MD   8 months ago Lumbar radiculopathy   [place], Patrick E, MD   8 months ago Lumbar radiculopathy   [place], Patrick E, MD

## 2020-08-15 ENCOUNTER — Other Ambulatory Visit (HOSPITAL_COMMUNITY): Payer: Self-pay

## 2020-09-30 ENCOUNTER — Other Ambulatory Visit: Payer: Self-pay

## 2020-11-06 ENCOUNTER — Other Ambulatory Visit: Payer: Self-pay

## 2020-11-06 ENCOUNTER — Emergency Department (HOSPITAL_COMMUNITY)
Admission: EM | Admit: 2020-11-06 | Discharge: 2020-11-06 | Disposition: A | Discharge status: Home / Self Care | Payer: Self-pay | Attending: Emergency Medicine | Admitting: Emergency Medicine

## 2020-11-06 DIAGNOSIS — M545 Low back pain, unspecified: Secondary | ICD-10-CM | POA: Insufficient documentation

## 2020-11-06 DIAGNOSIS — Z5321 Procedure and treatment not carried out due to patient leaving prior to being seen by health care provider: Secondary | ICD-10-CM | POA: Insufficient documentation

## 2020-11-06 DIAGNOSIS — R110 Nausea: Secondary | ICD-10-CM | POA: Insufficient documentation

## 2021-01-31 ENCOUNTER — Emergency Department (HOSPITAL_COMMUNITY)
Admission: EM | Admit: 2021-01-31 | Discharge: 2021-02-01 | Disposition: A | Discharge status: Home / Self Care | Payer: No Payment, Other | Attending: Emergency Medicine | Admitting: Emergency Medicine

## 2021-01-31 ENCOUNTER — Ambulatory Visit (HOSPITAL_COMMUNITY)
Admission: RE | Admit: 2021-01-31 | Discharge: 2021-01-31 | Disposition: A | Discharge status: Home / Self Care | Payer: Self-pay | Attending: Psychiatry | Admitting: Psychiatry

## 2021-01-31 DIAGNOSIS — F319 Bipolar disorder, unspecified: Secondary | ICD-10-CM | POA: Insufficient documentation

## 2021-01-31 DIAGNOSIS — Z933 Colostomy status: Secondary | ICD-10-CM | POA: Insufficient documentation

## 2021-01-31 DIAGNOSIS — Z85038 Personal history of other malignant neoplasm of large intestine: Secondary | ICD-10-CM | POA: Insufficient documentation

## 2021-01-31 DIAGNOSIS — F250 Schizoaffective disorder, bipolar type: Secondary | ICD-10-CM

## 2021-01-31 DIAGNOSIS — Z79899 Other long term (current) drug therapy: Secondary | ICD-10-CM | POA: Insufficient documentation

## 2021-01-31 DIAGNOSIS — F4310 Post-traumatic stress disorder, unspecified: Secondary | ICD-10-CM | POA: Insufficient documentation

## 2021-01-31 DIAGNOSIS — F17210 Nicotine dependence, cigarettes, uncomplicated: Secondary | ICD-10-CM | POA: Insufficient documentation

## 2021-01-31 DIAGNOSIS — R45851 Suicidal ideations: Secondary | ICD-10-CM | POA: Insufficient documentation

## 2021-01-31 DIAGNOSIS — Z20822 Contact with and (suspected) exposure to covid-19: Secondary | ICD-10-CM | POA: Insufficient documentation

## 2021-01-31 DIAGNOSIS — I252 Old myocardial infarction: Secondary | ICD-10-CM | POA: Insufficient documentation

## 2021-01-31 LAB — COMPREHENSIVE METABOLIC PANEL
ALT: 31 U/L (ref 0–44)
AST: 44 U/L — ABNORMAL HIGH (ref 15–41)
Albumin: 4.4 g/dL (ref 3.5–5.0)
Alkaline Phosphatase: 111 U/L (ref 38–126)
Anion gap: 10 (ref 5–15)
BUN: 23 mg/dL — ABNORMAL HIGH (ref 6–20)
CO2: 29 mmol/L (ref 22–32)
Calcium: 8.8 mg/dL — ABNORMAL LOW (ref 8.9–10.3)
Chloride: 96 mmol/L — ABNORMAL LOW (ref 98–111)
Creatinine, Ser: 1.05 mg/dL (ref 0.61–1.24)
GFR, Estimated: >60 mL/min (ref >60)
Glucose, Bld: 77 mg/dL (ref 70–99)
Potassium: 3.2 mmol/L — ABNORMAL LOW (ref 3.5–5.1)
Sodium: 135 mmol/L (ref 135–145)
Total Bilirubin: 0.8 mg/dL (ref 0.3–1.2)
Total Protein: 8.3 g/dL — ABNORMAL HIGH (ref 6.5–8.1)

## 2021-01-31 LAB — CBC WITH DIFFERENTIAL/PLATELET
Abs Immature Granulocytes: 0.1 10*3/uL — ABNORMAL HIGH (ref 0.00–0.07)
Basophils Absolute: 0.1 10*3/uL (ref 0.0–0.1)
Basophils Relative: 1 %
Eosinophils Absolute: 0 10*3/uL (ref 0.0–0.5)
Eosinophils Relative: 0 %
HCT: 39.6 % (ref 39.0–52.0)
Hemoglobin: 14.1 g/dL (ref 13.0–17.0)
Immature Granulocytes: 1 %
Lymphocytes Relative: 8 %
Lymphs Abs: 0.8 10*3/uL (ref 0.7–4.0)
MCH: 31.2 pg (ref 26.0–34.0)
MCHC: 35.6 g/dL (ref 30.0–36.0)
MCV: 87.6 fL (ref 80.0–100.0)
Monocytes Absolute: 1.1 10*3/uL — ABNORMAL HIGH (ref 0.1–1.0)
Monocytes Relative: 11 %
Neutro Abs: 8.3 10*3/uL — ABNORMAL HIGH (ref 1.7–7.7)
Neutrophils Relative %: 79 %
Platelets: 174 10*3/uL (ref 150–400)
RBC: 4.52 MIL/uL (ref 4.22–5.81)
RDW: 11.9 % (ref 11.5–15.5)
WBC: 10.4 10*3/uL (ref 4.0–10.5)
nRBC: 0 % (ref 0.0–0.2)

## 2021-01-31 LAB — RAPID URINE DRUG SCREEN, HOSP PERFORMED
Amphetamines: NOT DETECTED
Barbiturates: NOT DETECTED
Benzodiazepines: NOT DETECTED
Cocaine: NOT DETECTED
Opiates: NOT DETECTED
Tetrahydrocannabinol: NOT DETECTED

## 2021-01-31 LAB — ETHANOL: Alcohol, Ethyl (B): <10 mg/dL (ref <10)

## 2021-01-31 MED ORDER — OXYCODONE HCL 5 MG PO TABS
5.0000 mg | ORAL_TABLET | Freq: Once | ORAL | Status: AC
  Administered 2021-01-31: 5 mg via ORAL
  Filled 2021-01-31: qty 1

## 2021-01-31 MED ORDER — DIVALPROEX SODIUM 500 MG PO DR TAB
500.0000 mg | DELAYED_RELEASE_TABLET | Freq: Every day | ORAL | Status: DC
  Administered 2021-01-31 – 2021-02-01 (×2): 500 mg via ORAL
  Filled 2021-01-31 (×2): qty 1

## 2021-01-31 MED ORDER — LORAZEPAM 2 MG/ML IJ SOLN
2.0000 mg | Freq: Once | INTRAMUSCULAR | Status: AC
  Administered 2021-01-31: 2 mg via INTRAMUSCULAR
  Filled 2021-01-31: qty 1

## 2021-01-31 MED ORDER — DULOXETINE HCL 30 MG PO CPEP
60.0000 mg | ORAL_CAPSULE | Freq: Two times a day (BID) | ORAL | Status: DC
  Administered 2021-01-31 – 2021-02-01 (×2): 60 mg via ORAL
  Filled 2021-01-31 (×2): qty 2

## 2021-01-31 MED ORDER — DIVALPROEX SODIUM 500 MG PO DR TAB
500.0000 mg | DELAYED_RELEASE_TABLET | Freq: Every day | ORAL | Status: DC
  Administered 2021-01-31: 500 mg via ORAL
  Filled 2021-01-31: qty 1

## 2021-01-31 MED ORDER — CLONAZEPAM 0.5 MG PO TABS
0.5000 mg | ORAL_TABLET | Freq: Two times a day (BID) | ORAL | Status: DC
  Administered 2021-01-31 – 2021-02-01 (×3): 0.5 mg via ORAL
  Filled 2021-01-31 (×3): qty 1

## 2021-01-31 MED ORDER — HYDROCODONE-ACETAMINOPHEN 5-325 MG PO TABS
1.0000 | ORAL_TABLET | Freq: Four times a day (QID) | ORAL | Status: DC | PRN
Start: 2021-01-31 — End: 2021-01-31

## 2021-01-31 MED ORDER — QUETIAPINE FUMARATE 300 MG PO TABS
400.0000 mg | ORAL_TABLET | Freq: Every day | ORAL | Status: DC
  Administered 2021-01-31: 400 mg via ORAL
  Filled 2021-01-31: qty 1

## 2021-01-31 MED ORDER — CYCLOBENZAPRINE HCL 10 MG PO TABS
10.0000 mg | ORAL_TABLET | Freq: Two times a day (BID) | ORAL | Status: DC | PRN
  Administered 2021-01-31 – 2021-02-01 (×2): 10 mg via ORAL
  Filled 2021-01-31 (×2): qty 1

## 2021-01-31 MED ORDER — POTASSIUM CHLORIDE CRYS ER 20 MEQ PO TBCR
40.0000 meq | EXTENDED_RELEASE_TABLET | Freq: Once | ORAL | Status: AC
  Administered 2021-01-31: 40 meq via ORAL
  Filled 2021-01-31: qty 2

## 2021-01-31 MED ORDER — QUETIAPINE FUMARATE 50 MG PO TABS
50.0000 mg | ORAL_TABLET | Freq: Every morning | ORAL | Status: DC
  Administered 2021-02-01: 50 mg via ORAL
  Filled 2021-01-31: qty 1

## 2021-01-31 MED ORDER — DIVALPROEX SODIUM 250 MG PO DR TAB
250.0000 mg | DELAYED_RELEASE_TABLET | Freq: Every morning | ORAL | Status: DC
Start: 2021-02-01 — End: 2021-02-01
  Administered 2021-02-01: 250 mg via ORAL
  Filled 2021-01-31: qty 1

## 2021-01-31 NOTE — ED Provider Notes (Signed)
Pt is becoming more agitated.  I spoke with him and he calmed down.  However, a few hours later, he worsened.  He was trying to leave.  Due to the SI and severe depression, I took out IVC papers.   Isla Pence, MD 01/31/21 2237

## 2021-01-31 NOTE — Progress Notes (Signed)
Inpatient Behavioral Health Placement  Pt meets inpatient criteria per Dr.Kumar. Per [place] Lynnda Shields, RN [facility] has no appropriate beds.  Referral was sent to the following out of network facilities:  [facility] Provider Address Phone Fax  [facility]  [address] [place] [address] [phone] [place]  [address] [place] [address] [phone] [place]  [address], [place] [address] [phone] [place]  [address], [place] [address] [phone] [place], [place] [address] [phone] [place]  [address] [place] [address] [phone]  [facility]  [phone] [address] [place] [address] [place]  [facility]  [address] [place] [address] [place]  [facility]  [address] [place] [address] [phone]  [facility]  [address] [place] [address] [phone]  [facility]  [address], [place] [address] [phone]    Situation ongoing,  CSW will follow up.   Juan Hester, MSW, LCSWA 01/31/2021  @ 6:08 PM

## 2021-01-31 NOTE — BH Assessment (Addendum)
[facility] Assessment Progress Note   Per [name], MD this voluntary pt requires psychiatric hospitalization at this time.  Per Ernie Hew, MD pt needs a high level of care than [facility] can provide.  Pt has been submitted to [facility] to consider for admission.  Decision is pending as of this writing.  Jalene Mullet, [place] Coordinator [phone]

## 2021-01-31 NOTE — ED Notes (Addendum)
This RN and EMT Caryl Pina attempted to get patient to change out into burgandy scrubs, per psychiatric hospital policy/protocol. Patient said "I am not changing out into those scrubs. You dont have to make me do anything. Now if I went to jail they would make me dress out." Will attempt to change patient out after he cooperates and calms down.
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
## 2021-01-31 NOTE — Progress Notes (Signed)
Pt denied at [facility] due to having a colostomy bag. CSW will continue to assist with inpatient behavioral health placement.    Benjaman Kindler, MSW, LCSWA 01/31/2021 9:20 PM

## 2021-01-31 NOTE — ED Triage Notes (Signed)
Pt states he went BH earlier for help. States he is "tired". Pt denies SI but states he doesn't want to be here.

## 2021-01-31 NOTE — ED Notes (Addendum)
Patient dressed out into burgandy scrubs, wanded by security. 1 patient belongings bag across from room 12, with patients black shirt, kaky pants, lighter, keys and black slides.  Patient is upset and yelling that security wanded him down in a different way than the other patients. He asked to speak to the lead supervisor so that he can ask why he was treated different and said there is no consistency. Dr. Gilford Raid at bedside.

## 2021-01-31 NOTE — ED Notes (Signed)
Pt given burgundy scrubs and asked to change into them. Pt very resistant and agitated and advised he will change into them when he feels comfortable. Pt offered food/drink and he denied wanting anything at this time. Dr.Haviland and primary RN, Lysbeth Galas, aware. Will continue to monitor.

## 2021-01-31 NOTE — ED Provider Notes (Signed)
[facility] DEPT Provider Note   CSN: [id number] Arrival date & time: 01/31/21  0938     History Chief Complaint  Patient presents with   Medical Clearance    Juan Hester is a 40 y.o. male.  HPI  Patient presents to the ED for evaluation of suicidal ideation.  Patient was at the behavioral health center this morning.  Patient complained of worsening depression.  Patient thought about using a gun at his aunts house last night to end his life.  At the behavioral health center he was evaluated by Dr. Dwyane Dee, psychiatry.  Patient will require inpatient psychiatric care but he does have medical problems.  She wanted him cleared in the emergency room.  Patient denies any trouble with chest pain or shortness of breath.  No vomiting or diarrhea.  No fevers or chills he does have history of cardiac disease as well as PE and Crohn's disease.  He does have a colostomy  Past Medical History:  Diagnosis Date   Acute renal failure ([place]) 12/16/2019   Anxiety    per pt report   Colon cancer ([place])    Crohn disease ([place])    Depression    per pt report   ETOH abuse 10/16/2019   Foot drop, right    Multiple subsegmental pulmonary emboli without acute cor pulmonale ([place]) 07/21/2019   Myocardial infarction ([place])    PE (pulmonary thromboembolism) ([facility])    PTSD (post-traumatic stress disorder)    Thyroid disease     Patient Active Problem List   Diagnosis Date Noted   Suicide attempt by drug overdose ([place]) 05/12/2020   Bipolar I disorder with depression, severe ([place]) 05/10/2020   Overdose 05/03/2020   Chronic pain syndrome 01/31/2020   Homelessness 12/27/2019   Colostomy status ([place]) 12/16/2019   History of pulmonary embolism 12/16/2019   Right foot drop 12/15/2019   GAD (generalized anxiety disorder) 10/20/2019   MDD (major depressive disorder) 10/20/2019   PTSD (post-traumatic stress disorder) 10/20/2019   Bipolar affective disorder, currently depressed, mild  ([place]) 10/16/2019   Protrusion of lumbar intervertebral disc 10/05/2019   Bipolar 1 disorder ([place]) 07/21/2019   History of colon cancer 07/21/2019   Schizoaffective disorder, bipolar type ([place]) 06/03/2019   Nicotine dependence, cigarettes, uncomplicated 18/29/9371   Crohn's disease with complication ([place]) 69/67/8938    Past Surgical History:  Procedure Laterality Date   ABDOMINAL SURGERY     COLON SURGERY     IR RADIOLOGIST EVAL & MGMT  08/31/2019   OSTOMY     SMALL INTESTINE SURGERY         No family history on file.  Social History   Tobacco Use   Smoking status: Every Day    Packs/day: 1.00    Types: Cigarettes   Smokeless tobacco: Never  Vaping Use   Vaping Use: Never used  Substance Use Topics   Alcohol use: Yes    Comment: weekend   Drug use: Not Currently    Home Medications Prior to Admission medications   Medication Sig Start Date End Date Taking? Authorizing Provider  Calamine-Zinc Oxide 8-8 % LOTN APPLY 1 APPLICATION TOPICALLY AS NEEDED FOR ITCHING. ON TOP OF RIGHT FOOT 04/24/20 04/24/21  [name], MD  clonazePAM (KLONOPIN) 0.5 MG tablet TAKE 1/2 TABLET BY MOUTH TWO TIMES DAILY 12/29/19 06/26/20  [name], MD  cyclobenzaprine (FLEXERIL) 10 MG tablet TAKE 1 TABLET (10 MG TOTAL) BY MOUTH 2 (TWO) TIMES DAILY AS NEEDED FOR MUSCLE SPASMS.  04/24/20 04/24/21  [name], MD  cyclobenzaprine (FLEXERIL) 10 MG tablet TAKE 1 TABLET (10 MG TOTAL) BY MOUTH 2 (TWO) TIMES DAILY AS NEEDED FOR MUSCLE SPASMS. 03/15/20 03/15/21  [name], MD  divalproex (DEPAKOTE) 250 MG DR tablet Take 3 tablets (750 mg total) by mouth at bedtime. 05/15/20 06/14/20  [name], MD  divalproex (DEPAKOTE) 500 MG DR tablet Take 1 tablet (500 mg total) by mouth daily. 05/16/20 06/15/20  [name], MD  divalproex (DEPAKOTE) 500 MG DR tablet TAKE 1 TABLET (500 MG TOTAL) BY MOUTH 2 (TWO) TIMES DAILY. 04/19/20 04/19/21  Eulis Canner E, NP  DULoxetine (CYMBALTA)  60 MG capsule Take 1 capsule (60 mg total) by mouth daily. 04/19/20   Salley Slaughter, NP  DULoxetine (CYMBALTA) 60 MG capsule TAKE 1 CAPSULE (60 MG TOTAL) BY MOUTH DAILY. 04/19/20 04/19/21  Eulis Canner E, NP  gabapentin (NEURONTIN) 300 MG capsule Take 2 capsules (600 mg total) by mouth 3 (three) times daily. 05/10/20   Amin, Jeanella Flattery, MD  gabapentin (NEURONTIN) 300 MG capsule TAKE 1 CAPSULE (300 MG TOTAL) BY MOUTH 3 (THREE) TIMES DAILY. 12/29/19 12/28/20  [name], MD  hydrOXYzine (ATARAX/VISTARIL) 10 MG tablet TAKE 1 TABLET (10 MG TOTAL) BY MOUTH 3 (THREE) TIMES DAILY AS NEEDED. 04/24/20 04/24/21  [name], MD  hydrOXYzine (ATARAX/VISTARIL) 10 MG tablet TAKE 1 TABLET (10 MG TOTAL) BY MOUTH 3 (THREE) TIMES DAILY AS NEEDED. 03/15/20 03/15/21  [name], MD  naloxone [facility]) nasal spray 4 mg/0.1 mL PLACE 1 SPRAY (4 MG TOTAL) INTO THE NOSE AS NEEDED FOR OVERDOSE SYMPTOMS FROM OPIATE. SEE ADMINISTRATION INSTRUCTIONS 03/15/20 03/15/21  [name], MD  potassium chloride (KLOR-CON) 10 MEQ tablet Take 1 tablet (10 mEq total) by mouth 2 (two) times daily. 06/09/20   Montine Circle, PA-C  potassium chloride (KLOR-CON) 10 MEQ tablet TAKE 1 TABLET (10 MEQ TOTAL) BY MOUTH 2 (TWO) TIMES DAILY. 06/09/20 06/09/21  Montine Circle, PA-C  QUEtiapine (SEROQUEL) 300 MG tablet Take 1 tablet (300 mg total) by mouth at bedtime. 05/15/20 06/14/20  [name], MD  QUEtiapine (SEROQUEL) 300 MG tablet TAKE 1 TABLET (300 MG TOTAL) BY MOUTH AT BEDTIME. 12/29/19 12/28/20  [name], MD  QUEtiapine (SEROQUEL) 400 MG tablet TAKE 1 TABLET (400 MG TOTAL) BY MOUTH AT BEDTIME. 04/19/20 04/19/21  Salley Slaughter, NP  QUEtiapine (SEROQUEL) 50 MG tablet Take 1 tablet (50 mg total) by mouth daily at 6 (six) AM. 05/16/20 06/15/20  [name], MD  QUEtiapine (SEROQUEL) 50 MG tablet TAKE 1 TABLET (50 MG TOTAL) BY MOUTH DAILY. 04/19/20 04/19/21  Eulis Canner E, NP  QUEtiapine (SEROQUEL) 50  MG tablet TAKE 1 TABLET (50 MG TOTAL) BY MOUTH 2 (TWO) TIMES DAILY. 12/29/19 12/28/20  [name], MD    Allergies    Azathioprine, Ciprofloxacin, Mesalamine, Metronidazole, Sulfa antibiotics, Barium sulfate, and Barium-containing compounds  Review of Systems   Review of Systems  Gastrointestinal:  Negative for abdominal pain and constipation.       Occasional sulfur taste in the mouth  Skin:  Positive for rash.  All other systems reviewed and are negative.  Physical Exam Updated Vital Signs BP (!) 123/93   Pulse 87   Temp 97.9 F (36.6 C) (Oral)   Resp 18   Ht 1.854 m (6' 1" )   Wt 99.8 kg   SpO2 97%   BMI 29.03 kg/m   Physical Exam Vitals and  nursing note reviewed.  Constitutional:      General: He is not in acute distress.    Appearance: He is well-developed.  HENT:     Head: Normocephalic and atraumatic.     Right Ear: External ear normal.     Left Ear: External ear normal.  Eyes:     General: No scleral icterus.       Right eye: No discharge.        Left eye: No discharge.     Conjunctiva/sclera: Conjunctivae normal.  Neck:     Trachea: No tracheal deviation.  Cardiovascular:     Rate and Rhythm: Normal rate and regular rhythm.  Pulmonary:     Effort: Pulmonary effort is normal. No respiratory distress.     Breath sounds: Normal breath sounds. No stridor. No wheezing or rales.  Abdominal:     General: Bowel sounds are normal. There is no distension.     Palpations: Abdomen is soft.     Tenderness: There is no abdominal tenderness. There is no guarding or rebound.     Comments: Colostomy noted  Musculoskeletal:        General: No tenderness or deformity.     Cervical back: Neck supple.  Skin:    General: Skin is warm and dry.     Findings: No rash.  Neurological:     General: No focal deficit present.     Mental Status: He is alert.     Cranial Nerves: No cranial nerve deficit (no facial droop, extraocular movements intact, no slurred speech).      Sensory: No sensory deficit.     Motor: No abnormal muscle tone or seizure activity.     Coordination: Coordination normal.  Psychiatric:        Mood and Affect: Mood normal.        Behavior: Behavior normal.    ED Results / Procedures / Treatments   Labs (all labs ordered are listed, but only abnormal results are displayed) Labs Reviewed  COMPREHENSIVE METABOLIC PANEL - Abnormal; Notable for the following components:      Result Value   Potassium 3.2 (*)    Chloride 96 (*)    BUN 23 (*)    Calcium 8.8 (*)    Total Protein 8.3 (*)    AST 44 (*)    All other components within normal limits  CBC WITH DIFFERENTIAL/PLATELET - Abnormal; Notable for the following components:   Neutro Abs 8.3 (*)    Monocytes Absolute 1.1 (*)    Abs Immature Granulocytes 0.10 (*)    All other components within normal limits  RESP PANEL BY RT-PCR (FLU A&B, COVID) ARPGX2  ETHANOL  RAPID URINE DRUG SCREEN, HOSP PERFORMED    EKG EKG Interpretation  Date/Time:  Wednesday January 31 2021 14:53:03 EDT Ventricular Rate:  89 PR Interval:  164 QRS Duration: 90 QT Interval:  394 QTC Calculation: 479 R Axis:   -1 Text Interpretation: Normal sinus rhythm Normal ECG Since last tracing rate slower Confirmed by Dorie Rank [phone]) on 01/31/2021 3:01:19 PM  Radiology No results found.  Procedures Procedures   Medications Ordered in ED Medications  potassium chloride SA (KLOR-CON) CR tablet 40 mEq (has no administration in time range)  clonazePAM (KLONOPIN) tablet 0.5 mg (has no administration in time range)  cyclobenzaprine (FLEXERIL) tablet 10 mg (has no administration in time range)  divalproex (DEPAKOTE) DR tablet 500 mg (has no administration in time range)    ED Course  I  have reviewed the triage vital signs and the nursing notes.  Pertinent labs & imaging results that were available during my care of the patient were reviewed by me and considered in my medical decision making (see chart for  details).  Clinical Course as of 01/31/21 1503  Wed Jan 31, 2021  1343 Labs reviewed.  CBC metabolic panel unremarkable [JK]  1343 K level slightly decreased but I doubt clinically significant.  We will give dose potassium [JK]    Clinical Course User Index [JK] Dorie Rank, MD   MDM Rules/Calculators/A&P                          The patient has been placed in psychiatric observation due to the need to provide a safe environment for the patient while obtaining psychiatric consultation and evaluation, as well as ongoing medical and medication management to treat the patient's condition.  The patient has not been placed under full IVC at this time.  Patient presented to the ED for medical clearance after being seen by psychiatry.  Patient has A history of several medical conditions but he does not appear to have any acute medical complications at this time.  He is not having any chest pain or abdominal pain.  Is not having any vomiting.  Patient has a nonspecific rash but it does not appear to be infectious in nature.  Patient is medically cleared for psychiatric evaluation Final Clinical Impression(s) / ED Diagnoses Final diagnoses:  Suicidal ideation     Dorie Rank, MD 01/31/21 [phone]

## 2021-01-31 NOTE — Progress Notes (Signed)
Juan Hester was willing to accept pt however CSW identified a barrier that pt does not have any insurance and pt has a colostomy bag but can care for it himself. Per pt from speaking with nursing; [name], RN reports that pt has been trying to obtain insurance and pt states that he was going to court soon. 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 CSW will request that [facility] review tomorrow per shift report. Pt is a resident of [place] and spoke with [name] who informed [name] is not appropriate and to follow up with [facility]. CSW will continue to assist and follow to obtain inpatient behavioral health placement.     Benjaman Kindler, MSW, LCSWA 01/31/2021 10:18 PM

## 2021-01-31 NOTE — ED Notes (Signed)
Patient given another sandwich, drink, crackers.

## 2021-01-31 NOTE — H&P (Signed)
Behavioral Health Medical Screening Exam  Juan Hester is an 40 y.o. male.who presents as a walkin to [facility]. Patient reports he had a gun last night at Aunt's house and was planning to kill himself.  Patient reports that he has an act team, as that he hardly sees them and that they have been working with him since March.  Patient reports that he has to call them for medications, they are not providing him any therapy, any support.  Patient reports that he feels overwhelmed, has thoughts of ending his life and last night had a gun at his aunt's house and thought he would be better off dead.  Patient states that his life has gone downhill since he got diagnosed with Crohn's disease, has had cardiac issues, has had MI, feels that with all his medical issues, lack of support, his depression has worsened.  Patient also reports that he is diagnosed with schizoaffective disorder, still hears voices and is unable to cope  Patient reports that he is currently taking Seroquel 50 mg in the morning and 400 at bedtime, Depakote ER to 50 mg in the morning and 500 at bedtime, Cymbalta 60 mg twice a day.  Patient states he does not feel his medications are working, cannot keep himself safe and is tired of hearing voices and so he would be better off dead  Total Time spent with patient: 30 minutes  Psychiatric Specialty Exam: Physical Exam Constitutional:      Appearance: Normal appearance.  HENT:     Head: Normocephalic and atraumatic.     Nose: Nose normal.     Mouth/Throat:     Mouth: Mucous membranes are moist.  Eyes:     Extraocular Movements: Extraocular movements intact.     Pupils: Pupils are equal, round, and reactive to light.  Cardiovascular:     Pulses: Normal pulses.     Heart sounds: Normal heart sounds.     Comments: Complains of chest pain Musculoskeletal:     Cervical back: Normal range of motion and neck supple.  Neurological:     General: No focal deficit present.     Mental Status:  He is alert and oriented to person, place, and time.   Review of Systems  Constitutional:  Positive for fatigue. Negative for activity change and appetite change.  HENT:  Negative for congestion.   Eyes:  Positive for redness. Negative for photophobia, discharge and visual disturbance.  Respiratory: Negative.    Cardiovascular:  Positive for chest pain. Negative for palpitations.       Complains of chest pain  Musculoskeletal:  Positive for back pain.  Neurological:  Negative for dizziness, tremors, syncope and weakness.  Psychiatric/Behavioral:  Positive for agitation, dysphoric mood, sleep disturbance and suicidal ideas.   Blood pressure (!) 134/91, pulse (!) 109, temperature 98.6 F (37 C), temperature source Oral, resp. rate 18, SpO2 99 %.There is no height or weight on file to calculate BMI. General Appearance: Disheveled Eye Contact:  Fair Speech:  Clear and Coherent and Normal Rate Volume:  Normal Mood:  Depressed, Dysphoric, Hopeless, Irritable, and Worthless Affect:  Congruent and Labile Thought Process:  Coherent and Linear Orientation:  Full (Time, Place, and Person) Thought Content:  Illogical, Hallucinations: Auditory, and Rumination Suicidal Thoughts:  Yes.  with intent/plan Homicidal Thoughts:  No Memory:  Immediate;   Fair Recent;   Fair Remote;   Fair Judgement:  Impaired Insight:  Lacking Psychomotor Activity:  Mannerisms and Restlessness Concentration: Concentration: Fair and Attention  Span: Fair Recall:  [place]: Fair Akathisia:  No Handed:  Right AIMS (if indicated):    Assets:  Desire for Improvement Social Support Sleep:     Musculoskeletal: Strength & Muscle Tone: within normal limits Gait & Station: normal Patient leans: N/A  Blood pressure (!) 134/91, pulse (!) 109, temperature 98.6 F (37 C), temperature source Oral, resp. rate 18, SpO2 99 %.  Recommendations: Based on my evaluation the patient appears to have  an emergency medical condition for which I recommend the patient be transferred to the emergency department for further evaluation. Discussed with the EDP at [facility], patient is excepted for medical clearance.  Patient will need inpatient psychiatric care once medically cleared.  AC at [facility] aware of patient Juan Abbot, MD 01/31/2021, 8:45 AM

## 2021-01-31 NOTE — ED Notes (Signed)
Pt heard cursing and stating "I'm about to just walk the f*ck out. The doctor hasn't even seen me" and then began walking towards exit. Pt redirected back to bed and Dr.Haviland notified. Dr.Haviland at bedside speaking to pt at this time.

## 2021-01-31 NOTE — ED Notes (Signed)
Patient given drink and sandwich.

## 2021-02-01 ENCOUNTER — Other Ambulatory Visit: Payer: Self-pay

## 2021-02-01 DIAGNOSIS — F3131 Bipolar disorder, current episode depressed, mild: Secondary | ICD-10-CM | POA: Diagnosis not present

## 2021-02-01 LAB — RESP PANEL BY RT-PCR (FLU A&B, COVID) ARPGX2
Influenza A by PCR: NEGATIVE
Influenza B by PCR: NEGATIVE
SARS Coronavirus 2 by RT PCR: NEGATIVE

## 2021-02-01 MED ORDER — LORAZEPAM 2 MG/ML IJ SOLN: 2.0000 mg | Freq: Once | INTRAMUSCULAR | Status: DC

## 2021-02-01 MED ORDER — ZIPRASIDONE MESYLATE 20 MG IM SOLR
20.0000 mg | Freq: Once | INTRAMUSCULAR | Status: AC
  Administered 2021-02-01: 20 mg via INTRAMUSCULAR
  Filled 2021-02-01: qty 20

## 2021-02-01 MED ORDER — STERILE WATER FOR INJECTION IJ SOLN
INTRAMUSCULAR | Status: AC
  Administered 2021-02-01: 1.2 mL
  Filled 2021-02-01: qty 10

## 2021-02-01 NOTE — Consult Note (Signed)
[facility] Psych ED Discharge  02/01/2021 1:13 PM Juan Hester  MRN:  [id number] Principal Problem: <principal problem not specified> Discharge Diagnoses: Active Problems:   * No active hospital problems. *  Subjective: "I need housing, clothes, phone. I have nothing what I have is what I came in with yesterday. "  HPI: Patient seen and evaluated in person by this provider.  He originally made suicidal statements yesterday during intake.    Today he is clear and coherent with no suicidal/homicidal ideations, hallucinations, withdrawal symptoms, paranoia, mania symptoms or other psychiatric concerns.  He states his mood " is ok. I mean I dont know how I feel right now. THey told me I would find a place to go and now you acting like I am going home. " Prior to discharge planning patient endorse a "good mood. Denied depressive symptoms, anxiety and mania. His affect is congruent. His primary concern is housing and lack of other psychosocial factors such as clothing, phone, food etc). Patient continues to endorse that "he has nothing and his act team does not help him. I thought they were looking for me a place to go last night. What changed why are you asking me about going home? " The behavioral health coordinator has reached out to his act team [facility], who stated they were under the impression he was in jail and would come over and visit him.  He is calmly eating his lunch, when he inquires about his disposition with again emphasis placed on "waiting list at shelters, [facility] is a resource center for showers. And I have no where to go. "    He was receiving his care at [facility] and encouraged to go to [facility] for additional resources, food and therapy etc.  Psychiatrically stable for discharge.  Dr. Dwyane Dee was called and reviewed the client and concurs with the plan.   Client reports he is trying to get housing and has not been able to get approved for medicaid despite his list of  medical problems. His rent was paid for several months through federal funding, which is about to expire. He does have history of recent suicide attempt in January 2022 by overdose, followed by 2018 overdose, 2016 when his gun misfired. There have been  His history provided on admission is not consistent with medical records as he reports 2 previous suicide attempts; one in 2016 when a gun misfired and another by overdose in 2018.  There have been no ER visits or psychiatric admissions since January 2022. Secondary gain potential for housing and psychosocial issues. Pt has community support through [facility]. Pt's UDS and BAL were negative. Pt presents with suicidal statements and threats, his mood was euthymic and affect is blunt and congruent, prior to discharge planning in which he was noted to become irritable and flat, abrasive at times and has a high degree of secondary gain in the emergency room. Pt is stable and psychiatrically clear for discharge.     Total Time spent with patient: 45 minutes  Past Psychiatric History: bipolar disorder  Past Medical History:  Past Medical History:  Diagnosis Date   Acute renal failure ([place]) 12/16/2019   Anxiety    per pt report   Colon cancer ([place])    Crohn disease ([place])    Depression    per pt report   ETOH abuse 10/16/2019   Foot drop, right    Multiple subsegmental pulmonary emboli without acute cor pulmonale ([place]) 07/21/2019  Myocardial infarction [facility])    PE (pulmonary thromboembolism) ([facility])    PTSD (post-traumatic stress disorder)    Thyroid disease     Past Surgical History:  Procedure Laterality Date   ABDOMINAL SURGERY     COLON SURGERY     IR RADIOLOGIST EVAL & MGMT  08/31/2019   OSTOMY     SMALL INTESTINE SURGERY     Family History: No family history on file. Family Psychiatric  History: none Social History:  Social History   Substance and Sexual Activity  Alcohol Use Yes   Comment: weekend     Social History   Substance and Sexual  Activity  Drug Use Not Currently    Social History   Socioeconomic History   Marital status: Divorced    Spouse name: Not on file   Number of children: Not on file   Years of education: Not on file   Highest education level: Not on file  Occupational History   Not on file  Tobacco Use   Smoking status: Every Day    Packs/day: 1.00    Types: Cigarettes   Smokeless tobacco: Never  Vaping Use   Vaping Use: Never used  Substance and Sexual Activity   Alcohol use: Yes    Comment: weekend   Drug use: Not Currently   Sexual activity: Yes  Other Topics Concern   Not on file  Social History Narrative   Not on file   Social Determinants of Health   Financial Resource Strain: Not on file  Food Insecurity: Not on file  Transportation Needs: Not on file  Physical Activity: Not on file  Stress: Not on file  Social Connections: Not on file    Has this patient used any form of tobacco in the last 30 days? (Cigarettes, Smokeless Tobacco, Cigars, and/or Pipes) A prescription for an FDA-approved tobacco cessation medication was offered at discharge and the patient refused  Current Medications: Current Facility-Administered Medications  Medication Dose Route Frequency Provider Last Rate Last Admin   clonazePAM (KLONOPIN) tablet 0.5 mg  0.5 mg Oral BID Dorie Rank, MD   0.5 mg at 02/01/21 1017   cyclobenzaprine (FLEXERIL) tablet 10 mg  10 mg Oral BID PRN Dorie Rank, MD   10 mg at 02/01/21 1017   divalproex (DEPAKOTE) DR tablet 250 mg  250 mg Oral q morning Isla Pence, MD   250 mg at 02/01/21 1016   divalproex (DEPAKOTE) DR tablet 500 mg  500 mg Oral Daily Dorie Rank, MD   500 mg at 02/01/21 1023   divalproex (DEPAKOTE) DR tablet 500 mg  500 mg Oral QHS Isla Pence, MD   500 mg at 01/31/21 2126   DULoxetine (CYMBALTA) DR capsule 60 mg  60 mg Oral BID Isla Pence, MD   60 mg at 02/01/21 1017   LORazepam (ATIVAN) injection 2 mg  2 mg Intramuscular Once Rancour, Stephen, MD        QUEtiapine (SEROQUEL) tablet 400 mg  400 mg Oral QHS Isla Pence, MD   400 mg at 01/31/21 2126   QUEtiapine (SEROQUEL) tablet 50 mg  50 mg Oral q morning Isla Pence, MD   50 mg at 02/01/21 1017   Current Outpatient Medications  Medication Sig Dispense Refill   carisoprodol (SOMA) 250 MG tablet Take 250 mg by mouth daily as needed (muscle relaxing).     clonazePAM (KLONOPIN) 0.5 MG tablet TAKE 1/2 TABLET BY MOUTH TWO TIMES DAILY (Patient taking differently: Take 0.5 mg by mouth  daily as needed for anxiety.) 60 tablet 2   diphenoxylate-atropine (LOMOTIL) 2.5-0.025 MG tablet Take 1 tablet by mouth 4 (four) times daily as needed for diarrhea or loose stools.     divalproex (DEPAKOTE) 250 MG DR tablet Take 3 tablets (750 mg total) by mouth at bedtime. (Patient taking differently: Take 250 mg by mouth every morning.) 90 tablet 0   divalproex (DEPAKOTE) 500 MG DR tablet TAKE 1 TABLET (500 MG TOTAL) BY MOUTH 2 (TWO) TIMES DAILY. (Patient taking differently: Take 500 mg by mouth at bedtime.) 120 tablet 2   DULoxetine (CYMBALTA) 60 MG capsule Take 1 capsule (60 mg total) by mouth daily. (Patient taking differently: Take 60 mg by mouth 2 (two) times daily.) 60 capsule 2   naloxone (NARCAN) nasal spray 4 mg/0.1 mL PLACE 1 SPRAY (4 MG TOTAL) INTO THE NOSE AS NEEDED FOR OVERDOSE SYMPTOMS FROM OPIATE. SEE ADMINISTRATION INSTRUCTIONS (Patient taking differently: Place 1 spray into the nose once as needed (overdose).) 2 each 1   QUEtiapine (SEROQUEL) 400 MG tablet TAKE 1 TABLET (400 MG TOTAL) BY MOUTH AT BEDTIME. (Patient taking differently: Take 400 mg by mouth at bedtime.) 60 tablet 2   QUEtiapine (SEROQUEL) 50 MG tablet TAKE 1 TABLET (50 MG TOTAL) BY MOUTH DAILY. (Patient taking differently: Take 50 mg by mouth every morning.) 60 tablet 2   Calamine-Zinc Oxide 8-8 % LOTN APPLY 1 APPLICATION TOPICALLY AS NEEDED FOR ITCHING. ON TOP OF RIGHT FOOT (Patient not taking: No sig reported) 120 mL 0    cyclobenzaprine (FLEXERIL) 10 MG tablet TAKE 1 TABLET (10 MG TOTAL) BY MOUTH 2 (TWO) TIMES DAILY AS NEEDED FOR MUSCLE SPASMS. (Patient not taking: No sig reported) 30 tablet 0   cyclobenzaprine (FLEXERIL) 10 MG tablet TAKE 1 TABLET (10 MG TOTAL) BY MOUTH 2 (TWO) TIMES DAILY AS NEEDED FOR MUSCLE SPASMS. (Patient not taking: No sig reported) 30 tablet 0   divalproex (DEPAKOTE) 500 MG DR tablet Take 1 tablet (500 mg total) by mouth daily. (Patient not taking: Reported on 01/31/2021) 30 tablet 0   DULoxetine (CYMBALTA) 60 MG capsule TAKE 1 CAPSULE (60 MG TOTAL) BY MOUTH DAILY. (Patient not taking: No sig reported) 60 capsule 2   gabapentin (NEURONTIN) 300 MG capsule Take 2 capsules (600 mg total) by mouth 3 (three) times daily. (Patient not taking: No sig reported)     gabapentin (NEURONTIN) 300 MG capsule TAKE 1 CAPSULE (300 MG TOTAL) BY MOUTH 3 (THREE) TIMES DAILY. (Patient not taking: Reported on 01/31/2021) 90 capsule 2   hydrOXYzine (ATARAX/VISTARIL) 10 MG tablet TAKE 1 TABLET (10 MG TOTAL) BY MOUTH 3 (THREE) TIMES DAILY AS NEEDED. (Patient not taking: No sig reported) 60 tablet 0   hydrOXYzine (ATARAX/VISTARIL) 10 MG tablet TAKE 1 TABLET (10 MG TOTAL) BY MOUTH 3 (THREE) TIMES DAILY AS NEEDED. (Patient not taking: No sig reported) 30 tablet 0   potassium chloride (KLOR-CON) 10 MEQ tablet Take 1 tablet (10 mEq total) by mouth 2 (two) times daily. (Patient not taking: No sig reported) 6 tablet 0   potassium chloride (KLOR-CON) 10 MEQ tablet TAKE 1 TABLET (10 MEQ TOTAL) BY MOUTH 2 (TWO) TIMES DAILY. (Patient not taking: No sig reported) 6 tablet 0   QUEtiapine (SEROQUEL) 300 MG tablet Take 1 tablet (300 mg total) by mouth at bedtime. 30 tablet 0   QUEtiapine (SEROQUEL) 300 MG tablet TAKE 1 TABLET (300 MG TOTAL) BY MOUTH AT BEDTIME. 30 tablet 2   QUEtiapine (SEROQUEL) 50 MG tablet Take 1  tablet (50 mg total) by mouth daily at 6 (six) AM. 30 tablet 0   QUEtiapine (SEROQUEL) 50 MG tablet TAKE 1 TABLET (50 MG  TOTAL) BY MOUTH 2 (TWO) TIMES DAILY. 60 tablet 2   PTA Medications: (Not in a hospital admission)   Musculoskeletal: Strength & Muscle Tone: within normal limits Gait & Station: normal Patient leans: N/A  Psychiatric Specialty Exam: Physical Exam Vitals and nursing note reviewed.  Constitutional:      Appearance: Normal appearance.  HENT:     Head: Normocephalic.     Nose: Nose normal.  Cardiovascular:     Pulses: Normal pulses.  Pulmonary:     Effort: Pulmonary effort is normal.  Musculoskeletal:        General: Normal range of motion.  Neurological:     General: No focal deficit present.     Mental Status: He is alert and oriented to person, place, and time.  Psychiatric:        Attention and Perception: Attention and perception normal.        Mood and Affect: Mood is depressed.        Speech: Speech normal.        Behavior: Behavior normal. Behavior is cooperative.        Thought Content: Thought content normal.        Cognition and Memory: Cognition and memory normal.        Judgment: Judgment normal.    Review of Systems  Musculoskeletal:  Positive for back pain.  Psychiatric/Behavioral:  Positive for dysphoric mood.   All other systems reviewed and are negative.  Blood pressure 107/74, pulse 90, temperature 98 F (36.7 C), temperature source Oral, resp. rate 16, height 6' 1"  (1.854 m), weight 99.8 kg, SpO2 97 %.Body mass index is 29.03 kg/m.  General Appearance: Casual  Eye Contact:  Good  Speech:  Normal Rate  Volume:  Normal  Mood:  Euthymic  Affect:  Congruent  Thought Process:  Coherent and Descriptions of Associations: Intact  Orientation:  Full (Time, Place, and Person)  Thought Content:  WDL and Logical  Suicidal Thoughts:  No  Homicidal Thoughts:  No  Memory:  Immediate;   Good Recent;   Good Remote;   Good  Judgement:  Fair  Insight:  Fair  Psychomotor Activity:  Normal  Concentration:  Concentration: Good and Attention Span: Good  Recall:   Good  Fund of Knowledge:  Good  Language:  Good  Akathisia:  No  Handed:  Right  AIMS (if indicated):     Assets:  Housing Leisure Time Resilience  ADL's:  Intact  Cognition:  WNL  Sleep:       Demographic Factors:  Male, Adolescent or young adult, Caucasian, Low socioeconomic status, Living alone, and Unemployed  Loss Factors: Decrease in vocational status, Legal issues, and Financial problems/change in socioeconomic status  Historical Factors: Prior suicide attempts and Impulsivity  Risk Reduction Factors:   Living with another person, especially a relative, Positive social support, Positive therapeutic relationship, and Positive coping skills or problem solving skills  Continued Clinical Symptoms:  Depression, mild  Cognitive Features That Contribute To Risk:  None    Suicide Risk:  Minimal: No identifiable suicidal ideation.  Patients presenting with no risk factors but with morbid ruminations; may be classified as minimal risk based on the severity of the depressive symptoms   Plan Of Care/Follow-up recommendations:  Bipolar affective disorder, depressed, mild -Continue current medications at this time.  -Awaiting response  from [place] team to finalize appropriate safety plan. -Patient is to ultimately discharged once resources are finalized and in place.   Activity:  as tolerated Diet:  heart healthy diet  Disposition: discharge home Suella Broad, FNP 02/01/2021, 1:13 PM

## 2021-02-01 NOTE — ED Notes (Signed)
Patient irritated again, because he believes that he needs shoes to walk. Patient was provided hospital socks with the grips on them for safety. Patient also was given the option to have a wheelchair if he needed to be moved around and also offered a urinal. "I don't give a f*ck give me my shoes right now." Patient raging on another tangent about having his shoes after being reminded that it is hospital policy to gather all of patient belongings to keep himself safe and the staff. Patient told charge RN to "Get the [facility] out of his face." Verbal order for violent restraints ordered by Dr. Wyvonnia Dusky as well as medications. Patient unable to be consoled after verbal deescalating techniques.

## 2021-02-01 NOTE — ED Notes (Signed)
Calm. Cooperative. 100% of breakfast consumed. Snack given. Ambulatory to bathroom. Gait steady. No signs of distress.

## 2021-02-01 NOTE — ED Notes (Signed)
Pt given breakfast tray

## 2021-02-01 NOTE — ED Notes (Signed)
Pt loosened ostomy bag, soiling his scrubs and sheets. Pt ambulatory to bathroom. Pt provided with new scrubs and wash cloths. Pt able to clean self and empty ostomy bag. Sheets were changed.

## 2021-02-01 NOTE — BH Assessment (Signed)
[facility] Assessment Progress Note   Per Sheran Fava, NP, this pt does not require psychiatric hospitalization at this time.  Overnight EDP Isla Pence, MD initiated IVC for pt, which has now been rescinded by [name], MD.  Pt is psychiatrically cleared.  Discharge instructions advise pt to continue treatment with the [facility] ACT Team.  A TOC consult has been ordered to address pt's psychosocial needs.  EDP Marda Stalker, MD and pt's nurse, Tiffany, have been notified.  Jalene Mullet, [place] Triage Specialist [phone]

## 2021-02-01 NOTE — ED Notes (Signed)
Patient loosened ostomy bag and it was all over his scrubs. New scrubs provided for patient and ostomy bag emptied.

## 2021-02-01 NOTE — ED Notes (Signed)
Speaking with case workers to make a plan for going home and follow up care. Alert and oriented x 4. No complaints. Calm. No signs of distress.

## 2021-02-01 NOTE — ED Provider Notes (Signed)
Emergency Medicine Observation Re-evaluation Note  Juan Hester is a 40 y.o. male, seen on rounds today.  Pt initially presented to the ED for complaints of Medical Clearance and Suicidal Currently, the patient is awaiting psychiatric evaluation.  Physical Exam  BP 122/86 (BP Location: Right Arm)   Pulse 85   Temp 98.6 F (37 C) (Oral)   Resp 15   Ht 6' 1"  (1.854 m)   Wt 99.8 kg   SpO2 99%   BMI 29.03 kg/m  Physical Exam General: Asleep and resting Psych: Snoring and asleep  ED Course / MDM  EKG:EKG Interpretation  Date/Time:  Wednesday January 31 2021 14:53:03 EDT Ventricular Rate:  89 PR Interval:  164 QRS Duration: 90 QT Interval:  394 QTC Calculation: 479 R Axis:   -1 Text Interpretation: Normal sinus rhythm Normal ECG Since last tracing rate slower Confirmed by Dorie Rank [phone]) on 01/31/2021 3:01:19 PM  I have reviewed the labs performed to date as well as medications administered while in observation.  Recent changes in the last 24 hours include none.  Plan  Current plan is for awaiting psychiatric evaluation recommendations. Zyrion Coey is under involuntary commitment.      Cleone Hulick, Gwenyth Allegra, MD 02/01/21 [phone]

## 2021-02-01 NOTE — Discharge Instructions (Signed)
For your behavioral health needs you are advised to continue treatment with the [facility] ACT Team:       [facility] ACT Team      [facility], [address]      [address]      [place], [place]  [address]      [phone]      Crisis number: [phone]

## 2021-02-01 NOTE — Discharge Planning (Signed)
Reshawn Ostlund J. Clydene Laming, [place], BSN, [place]  RNCM set up appointment with [facility] on 11/2 @2 :68.  Placed appointment date and time on AVS and advised to please arrive 15 min early and take a picture ID and your current medications.

## 2021-02-03 ENCOUNTER — Encounter (HOSPITAL_COMMUNITY): Payer: Self-pay

## 2021-02-03 ENCOUNTER — Other Ambulatory Visit: Payer: Self-pay

## 2021-02-03 ENCOUNTER — Emergency Department (HOSPITAL_COMMUNITY)
Admission: EM | Admit: 2021-02-03 | Discharge: 2021-02-03 | Disposition: A | Discharge status: Home / Self Care | Payer: Self-pay | Attending: Emergency Medicine | Admitting: Emergency Medicine

## 2021-02-03 ENCOUNTER — Emergency Department (HOSPITAL_COMMUNITY): Payer: Self-pay

## 2021-02-03 DIAGNOSIS — F17210 Nicotine dependence, cigarettes, uncomplicated: Secondary | ICD-10-CM | POA: Insufficient documentation

## 2021-02-03 DIAGNOSIS — M5126 Other intervertebral disc displacement, lumbar region: Secondary | ICD-10-CM | POA: Insufficient documentation

## 2021-02-03 DIAGNOSIS — Z85038 Personal history of other malignant neoplasm of large intestine: Secondary | ICD-10-CM | POA: Insufficient documentation

## 2021-02-03 LAB — BASIC METABOLIC PANEL
Anion gap: 11 (ref 5–15)
BUN: 13 mg/dL (ref 6–20)
CO2: 24 mmol/L (ref 22–32)
Calcium: 8.2 mg/dL — ABNORMAL LOW (ref 8.9–10.3)
Chloride: 97 mmol/L — ABNORMAL LOW (ref 98–111)
Creatinine, Ser: 1.25 mg/dL — ABNORMAL HIGH (ref 0.61–1.24)
GFR, Estimated: >60 mL/min (ref >60)
Glucose, Bld: 100 mg/dL — ABNORMAL HIGH (ref 70–99)
Potassium: 3 mmol/L — ABNORMAL LOW (ref 3.5–5.1)
Sodium: 132 mmol/L — ABNORMAL LOW (ref 135–145)

## 2021-02-03 LAB — CBC WITH DIFFERENTIAL/PLATELET
Abs Immature Granulocytes: 0.05 10*3/uL (ref 0.00–0.07)
Basophils Absolute: 0 10*3/uL (ref 0.0–0.1)
Basophils Relative: 0 %
Eosinophils Absolute: 0.1 10*3/uL (ref 0.0–0.5)
Eosinophils Relative: 1 %
HCT: 37.1 % — ABNORMAL LOW (ref 39.0–52.0)
Hemoglobin: 13 g/dL (ref 13.0–17.0)
Immature Granulocytes: 1 %
Lymphocytes Relative: 5 %
Lymphs Abs: 0.5 10*3/uL — ABNORMAL LOW (ref 0.7–4.0)
MCH: 30.7 pg (ref 26.0–34.0)
MCHC: 35 g/dL (ref 30.0–36.0)
MCV: 87.7 fL (ref 80.0–100.0)
Monocytes Absolute: 1.1 10*3/uL — ABNORMAL HIGH (ref 0.1–1.0)
Monocytes Relative: 13 %
Neutro Abs: 7.2 10*3/uL (ref 1.7–7.7)
Neutrophils Relative %: 80 %
Platelets: 144 10*3/uL — ABNORMAL LOW (ref 150–400)
RBC: 4.23 MIL/uL (ref 4.22–5.81)
RDW: 11.9 % (ref 11.5–15.5)
WBC: 8.9 10*3/uL (ref 4.0–10.5)
nRBC: 0 % (ref 0.0–0.2)

## 2021-02-03 MED ORDER — METHOCARBAMOL 500 MG PO TABS
500.0000 mg | ORAL_TABLET | Freq: Two times a day (BID) | ORAL | 0 refills | Status: DC
  Filled 2021-02-03: qty 20, 10d supply, fill #0

## 2021-02-03 MED ORDER — PREDNISONE 20 MG PO TABS
60.0000 mg | ORAL_TABLET | Freq: Once | ORAL | Status: AC
  Administered 2021-02-03: 60 mg via ORAL
  Filled 2021-02-03: qty 3

## 2021-02-03 MED ORDER — GADOBUTROL 1 MMOL/ML IV SOLN
10.0000 mL | Freq: Once | INTRAVENOUS | Status: AC | PRN
  Administered 2021-02-03: 10 mL via INTRAVENOUS

## 2021-02-03 MED ORDER — METHOCARBAMOL 500 MG PO TABS
500.0000 mg | ORAL_TABLET | Freq: Once | ORAL | Status: AC
  Administered 2021-02-03: 500 mg via ORAL
  Filled 2021-02-03: qty 1

## 2021-02-03 MED ORDER — LIDOCAINE 5 % EX PTCH
1.0000 | MEDICATED_PATCH | CUTANEOUS | Status: DC
  Administered 2021-02-03: 1 via TRANSDERMAL
  Filled 2021-02-03: qty 1

## 2021-02-03 MED ORDER — KETOROLAC TROMETHAMINE 30 MG/ML IJ SOLN
30.0000 mg | Freq: Once | INTRAMUSCULAR | Status: AC
  Administered 2021-02-03: 30 mg via INTRAMUSCULAR
  Filled 2021-02-03: qty 1

## 2021-02-03 MED ORDER — PREDNISONE 10 MG PO TABS
ORAL_TABLET | Freq: Every day | ORAL | 0 refills | Status: DC
  Filled 2021-02-03: qty 21, 6d supply, fill #0

## 2021-02-03 NOTE — ED Notes (Signed)
Patient transported to MRI 

## 2021-02-03 NOTE — ED Provider Notes (Signed)
[facility] EMERGENCY DEPARTMENT Provider Note   CSN: [id number] Arrival date & time: 02/03/21  4431     History No chief complaint on file.   Juan Hester is a 40 y.o. male with PMHx homelessness, crohn's disease s/p colon cancer and colostomy, R foot drop who presents to the ED today with complaint of gradual onset, constant, worsening, Lower back pain radiating down RLE for the past 4-5 days. Pt reports history of injury to back last year. HE states he underwent physical therapy and had been doing well since about March of this year. He mentions worsening pain over the past several days that is new compared to previous. He mentions that he is currently in the process of applying for disability however missed a court date and has to reschedule. He is aware he needs to follow up with neurosurgery however cannot afford it until he goes on disability. He mentions that he was seen at [facility] last week with complaints of a burning sensation in his right foot. He states he was given oxycodone for same. Per chart review pt seen at [facility] on 10/19 for medical clearance after going to [facility] that same day for depression and thoughts about using a gun at his aunts house to end his life. He met inpatient criteria at that time. It does appear he was treated with 5 mg oxycodone at that time. PT with history of substance abuse/overdose. It appears he was being treated for chronic back pain earlier this year with narcotics however PCP took him off after overdose. Pt denies any IV drug abuse. He does mention on his way to the ED Today he urinated on himself. He states he was able to feel that he needed to urinate however his gait is so slow due to pain that he was unable to make it to the restroom in time. He has colostomy in place. He denies numbness to his groin. Denies fevers, chills, or any other associated symptoms.   The history is provided by the patient and medical records.      Past  Medical History:  Diagnosis Date   Acute renal failure ([place]) 12/16/2019   Anxiety    per pt report   Colon cancer ([place])    Crohn disease ([place])    Depression    per pt report   ETOH abuse 10/16/2019   Foot drop, right    Multiple subsegmental pulmonary emboli without acute cor pulmonale ([place]) 07/21/2019   Myocardial infarction [facility])    PE (pulmonary thromboembolism) ([place])    PTSD (post-traumatic stress disorder)    Thyroid disease     Patient Active Problem List   Diagnosis Date Noted   Suicide attempt by drug overdose ([place]) 05/12/2020   Bipolar I disorder with depression, severe ([place]) 05/10/2020   Overdose 05/03/2020   Chronic pain syndrome 01/31/2020   Homelessness 12/27/2019   Colostomy status ([place]) 12/16/2019   History of pulmonary embolism 12/16/2019   Right foot drop 12/15/2019   GAD (generalized anxiety disorder) 10/20/2019   MDD (major depressive disorder) 10/20/2019   PTSD (post-traumatic stress disorder) 10/20/2019   Bipolar affective disorder, currently depressed, mild ([place]) 10/16/2019   Protrusion of lumbar intervertebral disc 10/05/2019   Bipolar 1 disorder ([place]) 07/21/2019   History of colon cancer 07/21/2019   Schizoaffective disorder, bipolar type ([place]) 06/03/2019   Nicotine dependence, cigarettes, uncomplicated 54/00/8676   Crohn's disease with complication ([place]) 19/50/9326    Past Surgical History:  Procedure Laterality Date  ABDOMINAL SURGERY     COLON SURGERY     IR RADIOLOGIST EVAL & MGMT  08/31/2019   OSTOMY     SMALL INTESTINE SURGERY         No family history on file.  Social History   Tobacco Use   Smoking status: Every Day    Packs/day: 1.00    Types: Cigarettes   Smokeless tobacco: Never  Vaping Use   Vaping Use: Never used  Substance Use Topics   Alcohol use: Yes    Comment: weekend   Drug use: Not Currently    Home Medications Prior to Admission medications   Medication Sig Start Date End Date Taking? Authorizing Provider   methocarbamol (ROBAXIN) 500 MG tablet Take 1 tablet (500 mg total) by mouth 2 (two) times daily. 02/03/21  Yes Saxon Crosby, PA-C  predniSONE (STERAPRED UNI-PAK 21 TAB) 10 MG (21) TBPK tablet Take by mouth daily. Follow package insert 02/03/21  Yes Shubham Thackston, PA-C  Calamine-Zinc Oxide 8-8 % LOTN APPLY 1 APPLICATION TOPICALLY AS NEEDED FOR ITCHING. ON TOP OF RIGHT FOOT Patient not taking: No sig reported 04/24/20 04/24/21  [name], MD  carisoprodol (SOMA) 250 MG tablet Take 250 mg by mouth daily as needed (muscle relaxing).    [provider]  clonazePAM (KLONOPIN) 0.5 MG tablet TAKE 1/2 TABLET BY MOUTH TWO TIMES DAILY Patient taking differently: Take 0.5 mg by mouth daily as needed for anxiety. 12/29/19 01/31/21  [name], MD  cyclobenzaprine (FLEXERIL) 10 MG tablet TAKE 1 TABLET (10 MG TOTAL) BY MOUTH 2 (TWO) TIMES DAILY AS NEEDED FOR MUSCLE SPASMS. Patient not taking: No sig reported 04/24/20 04/24/21  [name], MD  cyclobenzaprine (FLEXERIL) 10 MG tablet TAKE 1 TABLET (10 MG TOTAL) BY MOUTH 2 (TWO) TIMES DAILY AS NEEDED FOR MUSCLE SPASMS. Patient not taking: No sig reported 03/15/20 03/15/21  [name], MD  diphenoxylate-atropine (LOMOTIL) 2.5-0.025 MG tablet Take 1 tablet by mouth 4 (four) times daily as needed for diarrhea or loose stools.    [provider]  divalproex (DEPAKOTE) 250 MG DR tablet Take 3 tablets (750 mg total) by mouth at bedtime. Patient taking differently: Take 250 mg by mouth every morning. 05/15/20 01/31/21  [name], MD  divalproex (DEPAKOTE) 500 MG DR tablet Take 1 tablet (500 mg total) by mouth daily. Patient not taking: Reported on 01/31/2021 05/16/20 06/15/20  [name], MD  divalproex (DEPAKOTE) 500 MG DR tablet TAKE 1 TABLET (500 MG TOTAL) BY MOUTH 2 (TWO) TIMES DAILY. Patient taking differently: Take 500 mg by mouth at bedtime. 04/19/20 04/19/21  Salley Slaughter, NP  DULoxetine (CYMBALTA)  60 MG capsule Take 1 capsule (60 mg total) by mouth daily. Patient taking differently: Take 60 mg by mouth 2 (two) times daily. 04/19/20   Salley Slaughter, NP  DULoxetine (CYMBALTA) 60 MG capsule TAKE 1 CAPSULE (60 MG TOTAL) BY MOUTH DAILY. Patient not taking: No sig reported 04/19/20 04/19/21  Eulis Canner E, NP  gabapentin (NEURONTIN) 300 MG capsule Take 2 capsules (600 mg total) by mouth 3 (three) times daily. Patient not taking: No sig reported 05/10/20   Amin, Jeanella Flattery, MD  gabapentin (NEURONTIN) 300 MG capsule TAKE 1 CAPSULE (300 MG TOTAL) BY MOUTH 3 (THREE) TIMES DAILY. Patient not taking: Reported on 01/31/2021 12/29/19 12/28/20  [name], MD  hydrOXYzine (ATARAX/VISTARIL) 10 MG tablet TAKE 1 TABLET (10 MG TOTAL) BY MOUTH 3 (THREE) TIMES DAILY AS NEEDED.  Patient not taking: No sig reported 04/24/20 04/24/21  [name], MD  hydrOXYzine (ATARAX/VISTARIL) 10 MG tablet TAKE 1 TABLET (10 MG TOTAL) BY MOUTH 3 (THREE) TIMES DAILY AS NEEDED. Patient not taking: No sig reported 03/15/20 03/15/21  [name], MD  naloxone [facility]) nasal spray 4 mg/0.1 mL PLACE 1 SPRAY (4 MG TOTAL) INTO THE NOSE AS NEEDED FOR OVERDOSE SYMPTOMS FROM OPIATE. SEE ADMINISTRATION INSTRUCTIONS Patient taking differently: Place 1 spray into the nose once as needed (overdose). 03/15/20 03/15/21  [name], MD  potassium chloride (KLOR-CON) 10 MEQ tablet Take 1 tablet (10 mEq total) by mouth 2 (two) times daily. Patient not taking: No sig reported 06/09/20   Montine Circle, PA-C  potassium chloride (KLOR-CON) 10 MEQ tablet TAKE 1 TABLET (10 MEQ TOTAL) BY MOUTH 2 (TWO) TIMES DAILY. Patient not taking: No sig reported 06/09/20 06/09/21  Montine Circle, PA-C  QUEtiapine (SEROQUEL) 300 MG tablet Take 1 tablet (300 mg total) by mouth at bedtime. 05/15/20 06/14/20  [name], MD  QUEtiapine (SEROQUEL) 300 MG tablet TAKE 1 TABLET (300 MG TOTAL) BY MOUTH AT BEDTIME. 12/29/19 12/28/20  [name], MD  QUEtiapine (SEROQUEL) 400 MG tablet TAKE 1 TABLET (400 MG TOTAL) BY MOUTH AT BEDTIME. Patient taking differently: Take 400 mg by mouth at bedtime. 04/19/20 04/19/21  Salley Slaughter, NP  QUEtiapine (SEROQUEL) 50 MG tablet Take 1 tablet (50 mg total) by mouth daily at 6 (six) AM. 05/16/20 06/15/20  [name], MD  QUEtiapine (SEROQUEL) 50 MG tablet TAKE 1 TABLET (50 MG TOTAL) BY MOUTH DAILY. Patient taking differently: Take 50 mg by mouth every morning. 04/19/20 04/19/21  Salley Slaughter, NP  QUEtiapine (SEROQUEL) 50 MG tablet TAKE 1 TABLET (50 MG TOTAL) BY MOUTH 2 (TWO) TIMES DAILY. 12/29/19 12/28/20  [name], MD    Allergies    Azathioprine, Ciprofloxacin, Mesalamine, Metronidazole, Other, Sulfa antibiotics, Barium sulfate, and Barium-containing compounds  Review of Systems   Review of Systems  Constitutional:  Negative for chills and fever.  Musculoskeletal:  Positive for arthralgias and back pain.  Neurological:  Negative for weakness.       + paresthesias to RLE  All other systems reviewed and are negative.  Physical Exam Updated Vital Signs BP 117/84   Pulse 97   Temp 99.3 F (37.4 C) (Oral)   Resp 15   SpO2 100%   Physical Exam Vitals and nursing note reviewed.  Constitutional:      Appearance: He is not ill-appearing or diaphoretic.  HENT:     Head: Normocephalic and atraumatic.  Eyes:     Conjunctiva/sclera: Conjunctivae normal.  Cardiovascular:     Rate and Rhythm: Normal rate and regular rhythm.     Pulses: Normal pulses.  Pulmonary:     Effort: Pulmonary effort is normal.     Breath sounds: Normal breath sounds. No wheezing, rhonchi or rales.  Abdominal:     Palpations: Abdomen is soft.     Tenderness: There is no abdominal tenderness.  Musculoskeletal:     Cervical back: Neck supple.     Comments: No C or T midline spinal TTP. + midline lumbar spinal TTP with associated right paralumbar musculature spasming. Strength limited to  RLE s/2 pain. Sensation intact. Patellar reflex intact. 2+ DP pulse.   Skin:    General: Skin is warm and dry.  Neurological:     Mental Status: He is alert.    ED Results /  Procedures / Treatments   Labs (all labs ordered are listed, but only abnormal results are displayed) Labs Reviewed  BASIC METABOLIC PANEL - Abnormal; Notable for the following components:      Result Value   Sodium 132 (*)    Potassium 3.0 (*)    Chloride 97 (*)    Glucose, Bld 100 (*)    Creatinine, Ser 1.25 (*)    Calcium 8.2 (*)    All other components within normal limits  CBC WITH DIFFERENTIAL/PLATELET - Abnormal; Notable for the following components:   HCT 37.1 (*)    Platelets 144 (*)    Lymphs Abs 0.5 (*)    Monocytes Absolute 1.1 (*)    All other components within normal limits  URINALYSIS, ROUTINE W REFLEX MICROSCOPIC    EKG None  Radiology MR Lumbar Spine W Wo Contrast  Result Date: 02/03/2021 CLINICAL DATA:  Low back pain, cauda equina syndrome suspected. Infection suspected. No recent injury. No previous relevant surgery. EXAM: MRI LUMBAR SPINE WITHOUT AND WITH CONTRAST TECHNIQUE: Multiplanar and multiecho pulse sequences of the lumbar spine were obtained without and with intravenous contrast. Post-contrast imaging was obtained as ordered. CONTRAST:  38m GADAVIST GADOBUTROL 1 MMOL/ML IV SOLN COMPARISON:  Lumbar MRI 09/15/2019.  Abdominopelvic CT 12/17/2019. FINDINGS: Despite efforts by the technologist and patient, mild motion artifact is present on today's exam and could not be eliminated. This reduces exam sensitivity and specificity. Segmentation: Conventional anatomy assumed, with the last open disc space designated L5-S1.Concordant with previous imaging. Alignment:  Stable minimal retrolisthesis at L5-S1. Vertebrae: No worrisome osseous lesion, acute fracture or pars defect. No abnormal osseous enhancement. No evidence of discitis or osteomyelitis. The visualized sacroiliac joints appear  unremarkable. The spinal canal is relatively small on a congenital basis. Conus medullaris: Extends to the L1-2 level and appears normal. Possible thin enhancement of the right S1 nerve root in the canal, best seen on the sagittal postcontrast images. No nerve root enlargement or nodularity identified. Paraspinal and other soft tissues: No significant paraspinal findings. Disc levels: From T12-L1 through L4-5, the discs remain well hydrated with preserved height. There is no disc herniation or significant spinal stenosis. Mild facet hypertrophy at L4-5. L5-S1: Stable disc desiccation with bulging and a shallow central disc protrusion. No sacral nerve root encroachment. Stable mild facet hypertrophy and foraminal narrowing bilaterally without nerve root encroachment. IMPRESSION: 1. Possible thin enhancement of the right S1 nerve root in the canal, a nonspecific finding which can be seen with inflammation. No nerve root enlargement or nodularity identified. The conus medullaris and cauda quadrant otherwise appear unremarkable. 2. No evidence of discitis or osteomyelitis. 3. Stable mild spondylosis with a shallow central disc protrusion at L5-S1, not causing nerve root encroachment. Electronically Signed   By: WRichardean SaleM.D.   On: 02/03/2021 13:47    Procedures Procedures   Medications Ordered in ED Medications  lidocaine (LIDODERM) 5 % 1 patch (1 patch Transdermal Patch Applied 02/03/21 1215)  ketorolac (TORADOL) 30 MG/ML injection 30 mg (30 mg Intramuscular Given 02/03/21 1215)  predniSONE (DELTASONE) tablet 60 mg (60 mg Oral Given 02/03/21 1215)  methocarbamol (ROBAXIN) tablet 500 mg (500 mg Oral Given 02/03/21 1215)  gadobutrol (GADAVIST) 1 MMOL/ML injection 10 mL (10 mLs Intravenous Contrast Given 02/03/21 1321)    ED Course  I have reviewed the triage vital signs and the nursing notes.  Pertinent labs & imaging results that were available during my care of the patient were reviewed by me  and considered in my medical decision making (see chart for details).    MDM Rules/Calculators/A&P                           40 year old male who presents to the ED today with complaint of worsening right lower back pain radiating to right lower extremity for the past 4 to 5 days.  History of herniated disc L5-S1 seen on MRI last year.  Patient had urinary accident on himself while coming to the ED today.  He states that he was able to feel that he needed to urinate however was unable to get to the bathroom quick enough.  On arrival to the ED his temperature is slightly elevated 99.3.  He is tachycardic at 106 however this is decreased while in the room.  Suspect secondary to pain.  On my exam he has antalgic gait.  He has midline lumbar spinal tenderness palpation with associated right paralumbar musculature spasming.  He has limited range of motion and strength to the right lower extremity which I suspect is likely secondary to pain.  He has known right foot drop.  Patient with questionable track marks to arms however he denies IV drug use.  Does appear that he has history of polysubstance abuse in the past and was taken off of narcotic prescription by PCP for chronic pain earlier this year.  Do suspect his pain is likely related to his herniated disc however given urinary accident today cannot rule out cauda equina.  There is also some concern for possible spinal epidural abscess with his temperature slightly being elevated and possible drug abuse.  We will plan for MRI at this time.  If negative will discharge home.   CBC without leukocytosis. Hgb stable at 13.0 Platelets 144. Has been as low as 106 recently.  BMP with potassium 3.0. Sodium and chloride slightly decreased at 132 and 97. Stable creatinine. No vomiting or diarrhea to suggest volume depletion.   MRI: IMPRESSION:  1. Possible thin enhancement of the right S1 nerve root in the  canal, a nonspecific finding which can be seen with  inflammation. No  nerve root enlargement or nodularity identified. The conus  medullaris and cauda quadrant otherwise appear unremarkable.  2. No evidence of discitis or osteomyelitis.  3. Stable mild spondylosis with a shallow central disc protrusion at  L5-S1, not causing nerve root encroachment.   On reevaluation pt reports improvement in his symptoms after toradol, robaxin, prednisone, and lidoderm patch. Will plan to discharge home with robaxin and prednisone Rx. Will avoid narcotics at this time given pt's history. PT will need to follow up outpatient with neurosurgery. He will also follow up with his PCP. He is in agreement with plan and stable for discharge home.   This note was prepared using Dragon voice recognition software and may include unintentional dictation errors due to the inherent limitations of voice recognition software.   Final Clinical Impression(s) / ED Diagnoses Final diagnoses:  Lumbar herniated disc    Rx / DC Orders ED Discharge Orders          Ordered    methocarbamol (ROBAXIN) 500 MG tablet  2 times daily        02/03/21 1429    predniSONE (STERAPRED UNI-PAK 21 TAB) 10 MG (21) TBPK tablet  Daily        02/03/21 1429             Discharge Instructions  Please follow up with [facility] for further evaluation of your herniated disc  Pick up medication and take as prescribed. DO NOT DRIVE OR DINK ALCOHOL WHILE ON THE MUSCLE RELAXER AS IT CAN MAKE YOU DROWSY  You can buy SalonPas patches OTC that have a similar percentage lidocaine in them then prescription strength patches.   Follow up with your PCP regarding ED visit today  Return to the ED for any new/worsening symptoms       Eustaquio Maize, PA-C 02/03/21 1431    Malvin Johns, MD 02/03/21 1451

## 2021-02-03 NOTE — Discharge Instructions (Signed)
Please follow up with [facility] for further evaluation of your herniated disc  Pick up medication and take as prescribed. DO NOT DRIVE OR DINK ALCOHOL WHILE ON THE MUSCLE RELAXER AS IT CAN MAKE YOU DROWSY  You can buy SalonPas patches OTC that have a similar percentage lidocaine in them then prescription strength patches.   Follow up with your PCP regarding ED visit today  Return to the ED for any new/worsening symptoms

## 2021-02-03 NOTE — ED Notes (Signed)
Pt still in MRI 

## 2021-02-03 NOTE — ED Triage Notes (Signed)
Patient complains of lower back pain for 4-5 days, has hx of old injury 1 year ago that had resolved. Now pain radiating from lower back to right. No new injury

## 2021-02-04 ENCOUNTER — Encounter (HOSPITAL_COMMUNITY): Payer: Self-pay | Admitting: Emergency Medicine

## 2021-02-04 ENCOUNTER — Other Ambulatory Visit: Payer: Self-pay

## 2021-02-04 ENCOUNTER — Emergency Department (HOSPITAL_COMMUNITY)
Admission: EM | Admit: 2021-02-04 | Discharge: 2021-02-04 | Disposition: A | Discharge status: Home / Self Care | Payer: Self-pay | Attending: Emergency Medicine | Admitting: Emergency Medicine

## 2021-02-04 DIAGNOSIS — Z9114 Patient's other noncompliance with medication regimen: Secondary | ICD-10-CM | POA: Insufficient documentation

## 2021-02-04 DIAGNOSIS — U071 COVID-19: Secondary | ICD-10-CM | POA: Insufficient documentation

## 2021-02-04 DIAGNOSIS — M5125 Other intervertebral disc displacement, thoracolumbar region: Secondary | ICD-10-CM | POA: Insufficient documentation

## 2021-02-04 DIAGNOSIS — Z85038 Personal history of other malignant neoplasm of large intestine: Secondary | ICD-10-CM | POA: Insufficient documentation

## 2021-02-04 DIAGNOSIS — Z7983 Long term (current) use of bisphosphonates: Secondary | ICD-10-CM | POA: Insufficient documentation

## 2021-02-04 DIAGNOSIS — Z933 Colostomy status: Secondary | ICD-10-CM | POA: Insufficient documentation

## 2021-02-04 DIAGNOSIS — Y900 Blood alcohol level of less than 20 mg/100 ml: Secondary | ICD-10-CM | POA: Insufficient documentation

## 2021-02-04 DIAGNOSIS — M545 Low back pain, unspecified: Secondary | ICD-10-CM

## 2021-02-04 DIAGNOSIS — G8929 Other chronic pain: Secondary | ICD-10-CM

## 2021-02-04 DIAGNOSIS — F3131 Bipolar disorder, current episode depressed, mild: Secondary | ICD-10-CM

## 2021-02-04 DIAGNOSIS — F1910 Other psychoactive substance abuse, uncomplicated: Secondary | ICD-10-CM | POA: Insufficient documentation

## 2021-02-04 DIAGNOSIS — Z79899 Other long term (current) drug therapy: Secondary | ICD-10-CM | POA: Insufficient documentation

## 2021-02-04 DIAGNOSIS — F17210 Nicotine dependence, cigarettes, uncomplicated: Secondary | ICD-10-CM | POA: Insufficient documentation

## 2021-02-04 DIAGNOSIS — F315 Bipolar disorder, current episode depressed, severe, with psychotic features: Secondary | ICD-10-CM | POA: Insufficient documentation

## 2021-02-04 DIAGNOSIS — F14922 Cocaine use, unspecified with intoxication with perceptual disturbance: Secondary | ICD-10-CM | POA: Insufficient documentation

## 2021-02-04 DIAGNOSIS — F411 Generalized anxiety disorder: Secondary | ICD-10-CM

## 2021-02-04 DIAGNOSIS — Z86711 Personal history of pulmonary embolism: Secondary | ICD-10-CM | POA: Insufficient documentation

## 2021-02-04 LAB — CBC WITH DIFFERENTIAL/PLATELET
Abs Immature Granulocytes: 0.05 10*3/uL (ref 0.00–0.07)
Basophils Absolute: 0 10*3/uL (ref 0.0–0.1)
Basophils Relative: 0 %
Eosinophils Absolute: 0 10*3/uL (ref 0.0–0.5)
Eosinophils Relative: 1 %
HCT: 37.7 % — ABNORMAL LOW (ref 39.0–52.0)
Hemoglobin: 12.8 g/dL — ABNORMAL LOW (ref 13.0–17.0)
Immature Granulocytes: 1 %
Lymphocytes Relative: 5 %
Lymphs Abs: 0.3 10*3/uL — ABNORMAL LOW (ref 0.7–4.0)
MCH: 30.3 pg (ref 26.0–34.0)
MCHC: 34 g/dL (ref 30.0–36.0)
MCV: 89.3 fL (ref 80.0–100.0)
Monocytes Absolute: 1.5 10*3/uL — ABNORMAL HIGH (ref 0.1–1.0)
Monocytes Relative: 21 %
Neutro Abs: 5 10*3/uL (ref 1.7–7.7)
Neutrophils Relative %: 72 %
Platelets: 140 10*3/uL — ABNORMAL LOW (ref 150–400)
RBC: 4.22 MIL/uL (ref 4.22–5.81)
RDW: 12.3 % (ref 11.5–15.5)
WBC: 6.9 10*3/uL (ref 4.0–10.5)
nRBC: 0 % (ref 0.0–0.2)

## 2021-02-04 LAB — RAPID URINE DRUG SCREEN, HOSP PERFORMED
Amphetamines: NOT DETECTED
Barbiturates: NOT DETECTED
Benzodiazepines: NOT DETECTED
Cocaine: POSITIVE — AB
Opiates: NOT DETECTED
Tetrahydrocannabinol: NOT DETECTED

## 2021-02-04 LAB — COMPREHENSIVE METABOLIC PANEL
ALT: 38 U/L (ref 0–44)
AST: 48 U/L — ABNORMAL HIGH (ref 15–41)
Albumin: 3.5 g/dL (ref 3.5–5.0)
Alkaline Phosphatase: 100 U/L (ref 38–126)
Anion gap: 10 (ref 5–15)
BUN: 19 mg/dL (ref 6–20)
CO2: 25 mmol/L (ref 22–32)
Calcium: 8.4 mg/dL — ABNORMAL LOW (ref 8.9–10.3)
Chloride: 99 mmol/L (ref 98–111)
Creatinine, Ser: 1.19 mg/dL (ref 0.61–1.24)
GFR, Estimated: >60 mL/min (ref >60)
Glucose, Bld: 103 mg/dL — ABNORMAL HIGH (ref 70–99)
Potassium: 3.4 mmol/L — ABNORMAL LOW (ref 3.5–5.1)
Sodium: 134 mmol/L — ABNORMAL LOW (ref 135–145)
Total Bilirubin: 1.2 mg/dL (ref 0.3–1.2)
Total Protein: 7 g/dL (ref 6.5–8.1)

## 2021-02-04 LAB — RESP PANEL BY RT-PCR (FLU A&B, COVID) ARPGX2
Influenza A by PCR: NEGATIVE
Influenza B by PCR: NEGATIVE
SARS Coronavirus 2 by RT PCR: POSITIVE — AB

## 2021-02-04 LAB — ETHANOL: Alcohol, Ethyl (B): <10 mg/dL (ref <10)

## 2021-02-04 LAB — D-DIMER, QUANTITATIVE: D-Dimer, Quant: 0.67 ug/mL-FEU — ABNORMAL HIGH (ref 0.00–0.50)

## 2021-02-04 MED ORDER — QUETIAPINE FUMARATE 300 MG PO TABS
300.0000 mg | ORAL_TABLET | Freq: Every day | ORAL | 0 refills | Status: DC
Start: 2021-02-04 — End: 2021-02-07

## 2021-02-04 MED ORDER — PREDNISONE 10 MG (21) PO TBPK: ORAL_TABLET | Freq: Every day | ORAL | 0 refills | Status: DC

## 2021-02-04 MED ORDER — LIDOCAINE 5 % EX PTCH
1.0000 | MEDICATED_PATCH | CUTANEOUS | Status: DC
  Administered 2021-02-04: 1 via TRANSDERMAL
  Filled 2021-02-04: qty 1

## 2021-02-04 MED ORDER — METHOCARBAMOL 500 MG PO TABS
500.0000 mg | ORAL_TABLET | Freq: Once | ORAL | Status: AC
  Administered 2021-02-04: 500 mg via ORAL
  Filled 2021-02-04: qty 1

## 2021-02-04 MED ORDER — ACETAMINOPHEN 500 MG PO TABS
1000.0000 mg | ORAL_TABLET | Freq: Once | ORAL | Status: AC
  Administered 2021-02-04: 1000 mg via ORAL
  Filled 2021-02-04: qty 2

## 2021-02-04 MED ORDER — DULOXETINE HCL 60 MG PO CPEP: 60.0000 mg | ORAL_CAPSULE | Freq: Two times a day (BID) | ORAL | 0 refills | Status: DC

## 2021-02-04 MED ORDER — DIVALPROEX SODIUM 500 MG PO DR TAB: 500.0000 mg | DELAYED_RELEASE_TABLET | Freq: Every evening | ORAL | 0 refills | Status: DC

## 2021-02-04 MED ORDER — QUETIAPINE FUMARATE 50 MG PO TABS: 50.0000 mg | ORAL_TABLET | Freq: Every day | ORAL | 0 refills | Status: DC

## 2021-02-04 MED ORDER — LORAZEPAM 1 MG PO TABS
1.0000 mg | ORAL_TABLET | Freq: Once | ORAL | Status: AC
  Administered 2021-02-04: 1 mg via ORAL
  Filled 2021-02-04: qty 1

## 2021-02-04 NOTE — ED Notes (Signed)
Uncooperative wants to come out into the hallway. Explained to him that he is COVID pos and he must stay in the room.  He is impatient and wants results of all tests now.

## 2021-02-04 NOTE — Care Management (Signed)
ED CM received consult from Dr. Tamsen Snider concerning medication assistance. Patient is out of his psych meds and this is 2nd ED visit in 24 hours for related issues. Patient is unisnured and cannot afford it.  Patient is eligible for [facility] medication assistance. ED RNCM reviewed chart patient has not used [place] program in the past.  Patient was enrolled and voucher was sent to [facility] on [address] along with his prescriptions. CM called [facility] to confirm receipt.  Updated EDP Dr. Tamsen Snider. Patient has appt with [facility] 11/2 for follow up.

## 2021-02-04 NOTE — Discharge Instructions (Addendum)
Refills of your medicines were [facility] the [facility] on [address]. You will be able to pick those up free of charge as our case manager sent resources to pay for these. Follow up with your primary care doctor and your mental health provider. Stop using illicit substances such as cocaine as this will make your symptoms worse.

## 2021-02-04 NOTE — Care Management (Signed)
  [place] Medication Assistance Card   Name:  Juan Hester  ID (MRN): [id number] [place]: [id number] RX Group: [id number] Discharge Date: 02/04/2021 Expiration Date:  02/18/2021                                           (must be filled within 7 days of discharge)         Dear   : [name]   You have been approved to have the prescriptions written by your discharging physician filled through our [facility] (Medication Assistance Through [facility]) program. This program allows for a one-time (no refills) 34-day supply of selected medications for a low copay amount.  The copay is $3.00 per prescription. For instance, if you have one prescription, you will pay $3.00; for two prescriptions, you pay $6.00; for three prescriptions, you pay $9.00; and so on.  Only certain pharmacies are participating in this program with [facility]. You will need to select one of the pharmacies from the attached list and take your prescriptions, this letter, and your photo ID to one of the participating pharmacies.   We are excited that you are able to use the [facility] program to get your medications. These prescriptions must be filled within 7 days of hospital discharge or they will no longer be valid for the [facility] program. Should you have any problems with your prescriptions please contact your case management team member at [phone] for [place]/ [place] you, [place] Management

## 2021-02-04 NOTE — ED Notes (Signed)
Attempted to take pt's vitals. Pt asked to wait me to wait so he could empty his colostomy bag. Pt then ambulated to the restroom. Will reattempt when pt reenters room.

## 2021-02-04 NOTE — ED Notes (Signed)
Security required to escort patient out. Continues to want to argue with staff about his care.  Is he not satisfied with anything that is offered.  He complained that he had to pick his own meds up [facility] which are being paid for.

## 2021-02-04 NOTE — ED Triage Notes (Addendum)
Pt reports sleep deprivation and auditory/visual hallucinations x 3 days.  States he was seen in ED yesterday for a herniated disc.  Denies SI/HI.

## 2021-02-04 NOTE — ED Provider Notes (Signed)
Emergency Medicine Provider Triage Evaluation Note  Juan Hester , a 40 y.o. male  was evaluated in triage.  Pt complains of "hallucinations due to sleep deprivation" for the past 3 days.  Patient reports he was seen here yesterday and told he had a herniated disc in his lumbar.  He mentions he has been unable to pick up prescriptions due to financial reasons.  He reports his hallucinations are both visual and auditory.  He keeps seeing shadows and silhouettes of people that are not there.  He reports he keeps hearing clicking and noises and taps.  Denies any SI or HI.  Patient is schizoaffective bipolar disorder has not been on medication in a few weeks.  Denies any IV drug use.  Reports occasional marijuana.  Denies any other illicit drug use.  Review of Systems  Positive: hallucinations, insomnia Negative: SI, HI  Physical Exam  BP (!) 140/101   Pulse (!) 109   Temp 99.5 F (37.5 C)   Resp 16   SpO2 100%  Gen:   Awake, no distress   Resp:  Normal effort  MSK:   Moves extremities without difficulty  Other:  Does not appear to be responding to internal stimuli  Medical Decision Making  Medically screening exam initiated at 12:12 PM.  Appropriate orders placed.  Juan Hester was informed that the remainder of the evaluation will be completed by another provider, this initial triage assessment does not replace that evaluation, and the importance of remaining in the ED until their evaluation is complete.     Juan Puller, PA-C 02/04/21 1217    Isla Pence, MD 02/04/21 [phone]

## 2021-02-04 NOTE — ED Notes (Signed)
Patient upset that I've advised him that it is required that he stay in his room due to being COVID positive.  He has asked that I leave his room.  However, he will take his Ativan when asked which is the reason for the nurse going in the room.

## 2021-02-04 NOTE — ED Provider Notes (Signed)
[facility] EMERGENCY DEPARTMENT Provider Note   CSN: [id number] Arrival date & time: 02/04/21  1137     History Chief Complaint  Patient presents with   Hallucinations    Juan Hester is a 40 y.o. male.  HPI This is a 40 year old male with extensive past medical history including Crohn's disease, colon cancer, partial colectomy with ostomy, depression, schizoaffective disorder, prior suicide attempts, polysubstance abuse, and PE who presents with medication concerns.  Patient was seen in this ED yesterday for back pain and was found to have a herniated disc without signs of cauda equina.  Diagnosed with MRI.  He was discharged with pain medicines including Flexeril as well as prednisone.  He has been unable to fill these prescriptions and also unable to fill prescriptions for his psych medicines including Depakote, Seroquel, and Cymbalta.  He reports that he is now having hallucinations for the past several days.  Describes auditory and visual hallucinations, no command hallucinations.  Just hearing faint whispers without discernable words. Denies SI and HI.  Denies recent ingestions.    Past Medical History:  Diagnosis Date   Acute renal failure ([place]) 12/16/2019   Anxiety    per pt report   Colon cancer ([place])    Crohn disease ([place])    Depression    per pt report   ETOH abuse 10/16/2019   Foot drop, right    Multiple subsegmental pulmonary emboli without acute cor pulmonale ([place]) 07/21/2019   Myocardial infarction [facility])    PE (pulmonary thromboembolism) (HCC)    PTSD (post-traumatic stress disorder)    Thyroid disease     Patient Active Problem List   Diagnosis Date Noted   Suicide attempt by drug overdose ([place]) 05/12/2020   Bipolar I disorder with depression, severe ([place]) 05/10/2020   Overdose 05/03/2020   Chronic pain syndrome 01/31/2020   Homelessness 12/27/2019   Colostomy status ([place]) 12/16/2019   History of pulmonary embolism 12/16/2019   Right  foot drop 12/15/2019   GAD (generalized anxiety disorder) 10/20/2019   MDD (major depressive disorder) 10/20/2019   PTSD (post-traumatic stress disorder) 10/20/2019   Bipolar affective disorder, currently depressed, mild ([place]) 10/16/2019   Protrusion of lumbar intervertebral disc 10/05/2019   Bipolar 1 disorder ([place]) 07/21/2019   History of colon cancer 07/21/2019   Schizoaffective disorder, bipolar type ([place]) 06/03/2019   Nicotine dependence, cigarettes, uncomplicated 61/95/0932   Crohn's disease with complication ([place]) 67/03/4579    Past Surgical History:  Procedure Laterality Date   ABDOMINAL SURGERY     COLON SURGERY     IR RADIOLOGIST EVAL & MGMT  08/31/2019   OSTOMY     SMALL INTESTINE SURGERY         No family history on file.  Social History   Tobacco Use   Smoking status: Every Day    Packs/day: 1.00    Types: Cigarettes   Smokeless tobacco: Never  Vaping Use   Vaping Use: Never used  Substance Use Topics   Alcohol use: Yes    Comment: weekend   Drug use: Not Currently    Home Medications Prior to Admission medications   Medication Sig Start Date End Date Taking? Authorizing Provider  divalproex (DEPAKOTE) 500 MG DR tablet Take 1 tablet (500 mg total) by mouth at bedtime. 02/04/21  Yes Coralee Pesa, MD  DULoxetine (CYMBALTA) 60 MG capsule Take 1 capsule (60 mg total) by mouth 2 (two) times daily. 02/04/21 03/06/21 Yes Coralee Pesa, MD  predniSONE (  STERAPRED UNI-PAK 21 TAB) 10 MG (21) TBPK tablet Take by mouth daily. Take 6 tabs by mouth daily  for 2 days, then 5 tabs for 2 days, then 4 tabs for 2 days, then 3 tabs for 2 days, 2 tabs for 2 days, then 1 tab by mouth daily for 2 days 02/04/21  Yes Coralee Pesa, MD  QUEtiapine (SEROQUEL) 300 MG tablet Take 1 tablet (300 mg total) by mouth at bedtime. 02/04/21  Yes Coralee Pesa, MD  QUEtiapine (SEROQUEL) 50 MG tablet Take 1 tablet (50 mg total) by mouth daily. 02/04/21 03/06/21 Yes Coralee Pesa,  MD  Calamine-Zinc Oxide 8-8 % LOTN APPLY 1 APPLICATION TOPICALLY AS NEEDED FOR ITCHING. ON TOP OF RIGHT FOOT Patient not taking: No sig reported 04/24/20 04/24/21  [name], MD  carisoprodol (SOMA) 250 MG tablet Take 250 mg by mouth daily as needed (muscle relaxing).    [provider]  clonazePAM (KLONOPIN) 0.5 MG tablet TAKE 1/2 TABLET BY MOUTH TWO TIMES DAILY Patient taking differently: Take 0.5 mg by mouth daily as needed for anxiety. 12/29/19 01/31/21  [name], MD  cyclobenzaprine (FLEXERIL) 10 MG tablet TAKE 1 TABLET (10 MG TOTAL) BY MOUTH 2 (TWO) TIMES DAILY AS NEEDED FOR MUSCLE SPASMS. Patient not taking: No sig reported 04/24/20 04/24/21  [name], MD  cyclobenzaprine (FLEXERIL) 10 MG tablet TAKE 1 TABLET (10 MG TOTAL) BY MOUTH 2 (TWO) TIMES DAILY AS NEEDED FOR MUSCLE SPASMS. Patient not taking: No sig reported 03/15/20 03/15/21  [name], MD  diphenoxylate-atropine (LOMOTIL) 2.5-0.025 MG tablet Take 1 tablet by mouth 4 (four) times daily as needed for diarrhea or loose stools.    [provider]  divalproex (DEPAKOTE) 250 MG DR tablet Take 3 tablets (750 mg total) by mouth at bedtime. Patient taking differently: Take 250 mg by mouth every morning. 05/15/20 01/31/21  [name], MD  divalproex (DEPAKOTE) 500 MG DR tablet Take 1 tablet (500 mg total) by mouth daily. Patient not taking: Reported on 01/31/2021 05/16/20 06/15/20  [name], MD  divalproex (DEPAKOTE) 500 MG DR tablet TAKE 1 TABLET (500 MG TOTAL) BY MOUTH 2 (TWO) TIMES DAILY. Patient taking differently: Take 500 mg by mouth at bedtime. 04/19/20 04/19/21  Salley Slaughter, NP  DULoxetine (CYMBALTA) 60 MG capsule Take 1 capsule (60 mg total) by mouth daily. Patient taking differently: Take 60 mg by mouth 2 (two) times daily. 04/19/20   Salley Slaughter, NP  DULoxetine (CYMBALTA) 60 MG capsule TAKE 1 CAPSULE (60 MG TOTAL) BY MOUTH DAILY. Patient not taking: No sig  reported 04/19/20 04/19/21  Eulis Canner E, NP  gabapentin (NEURONTIN) 300 MG capsule Take 2 capsules (600 mg total) by mouth 3 (three) times daily. Patient not taking: No sig reported 05/10/20   Amin, Jeanella Flattery, MD  gabapentin (NEURONTIN) 300 MG capsule TAKE 1 CAPSULE (300 MG TOTAL) BY MOUTH 3 (THREE) TIMES DAILY. Patient not taking: Reported on 01/31/2021 12/29/19 12/28/20  [name], MD  hydrOXYzine (ATARAX/VISTARIL) 10 MG tablet TAKE 1 TABLET (10 MG TOTAL) BY MOUTH 3 (THREE) TIMES DAILY AS NEEDED. Patient not taking: No sig reported 04/24/20 04/24/21  [name], MD  hydrOXYzine (ATARAX/VISTARIL) 10 MG tablet TAKE 1 TABLET (10 MG TOTAL) BY MOUTH 3 (THREE) TIMES DAILY AS NEEDED. Patient not taking: No sig reported 03/15/20 03/15/21  [name], MD  methocarbamol (ROBAXIN) 500 MG tablet Take 1 tablet (500 mg total) by mouth  2 (two) times daily. 02/03/21   Alroy Bailiff, Margaux, PA-C  naloxone [facility]) nasal spray 4 mg/0.1 mL PLACE 1 SPRAY (4 MG TOTAL) INTO THE NOSE AS NEEDED FOR OVERDOSE SYMPTOMS FROM OPIATE. SEE ADMINISTRATION INSTRUCTIONS Patient taking differently: Place 1 spray into the nose once as needed (overdose). 03/15/20 03/15/21  [name], MD  potassium chloride (KLOR-CON) 10 MEQ tablet Take 1 tablet (10 mEq total) by mouth 2 (two) times daily. Patient not taking: No sig reported 06/09/20   Montine Circle, PA-C  potassium chloride (KLOR-CON) 10 MEQ tablet TAKE 1 TABLET (10 MEQ TOTAL) BY MOUTH 2 (TWO) TIMES DAILY. Patient not taking: No sig reported 06/09/20 06/09/21  Montine Circle, PA-C  predniSONE (DELTASONE) 10 MG tablet Take 6 tablets by mouth on day 1, 5 tablets on day 2, 4 tablets on day 3, 3 tabets on day 4, 2 tablets on day 5, take 1 tablet on day 6 02/03/21   Alroy Bailiff, Margaux, PA-C  QUEtiapine (SEROQUEL) 300 MG tablet Take 1 tablet (300 mg total) by mouth at bedtime. 05/15/20 06/14/20  [name], MD  QUEtiapine (SEROQUEL) 300 MG tablet TAKE 1  TABLET (300 MG TOTAL) BY MOUTH AT BEDTIME. 12/29/19 12/28/20  [name], MD  QUEtiapine (SEROQUEL) 400 MG tablet TAKE 1 TABLET (400 MG TOTAL) BY MOUTH AT BEDTIME. Patient taking differently: Take 400 mg by mouth at bedtime. 04/19/20 04/19/21  Salley Slaughter, NP  QUEtiapine (SEROQUEL) 50 MG tablet Take 1 tablet (50 mg total) by mouth daily at 6 (six) AM. 05/16/20 06/15/20  [name], MD  QUEtiapine (SEROQUEL) 50 MG tablet TAKE 1 TABLET (50 MG TOTAL) BY MOUTH DAILY. Patient taking differently: Take 50 mg by mouth every morning. 04/19/20 04/19/21  Salley Slaughter, NP  QUEtiapine (SEROQUEL) 50 MG tablet TAKE 1 TABLET (50 MG TOTAL) BY MOUTH 2 (TWO) TIMES DAILY. 12/29/19 12/28/20  [name], MD    Allergies    Azathioprine, Ciprofloxacin, Mesalamine, Metronidazole, Other, Sulfa antibiotics, Barium sulfate, and Barium-containing compounds  Review of Systems   Review of Systems  Constitutional:  Negative for chills and fever.  HENT:  Negative for ear pain and sore throat.   Eyes:  Negative for pain and visual disturbance.  Respiratory:  Positive for shortness of breath. Negative for cough.   Cardiovascular:  Negative for chest pain and palpitations.  Gastrointestinal:  Negative for abdominal pain and vomiting.  Genitourinary:  Negative for dysuria and hematuria.  Musculoskeletal:  Positive for back pain. Negative for arthralgias.  Skin:  Negative for color change and rash.  Neurological:  Negative for seizures and syncope.  Psychiatric/Behavioral:  Positive for hallucinations and sleep disturbance. Negative for self-injury and suicidal ideas.   All other systems reviewed and are negative.  Physical Exam Updated Vital Signs BP (!) 136/108 (BP Location: Right Arm)   Pulse (!) 117   Temp 98.6 F (37 C) (Oral)   Resp 18   SpO2 100%   Physical Exam Vitals and nursing note reviewed.  Constitutional:      Appearance: He is well-developed.  HENT:     Head: Normocephalic  and atraumatic.  Eyes:     Conjunctiva/sclera: Conjunctivae normal.  Neck:     Comments: TTP of L spine in midline and laterally to the L. Full ROM and strength of LE. Ambulates without difficulty.  Cardiovascular:     Rate and Rhythm: Normal rate and regular rhythm.     Heart sounds: No murmur heard. Pulmonary:  Effort: Pulmonary effort is normal. No respiratory distress.     Breath sounds: Normal breath sounds.  Abdominal:     Palpations: Abdomen is soft.     Tenderness: There is no abdominal tenderness.  Musculoskeletal:     Cervical back: Neck supple.  Skin:    General: Skin is warm and dry.  Neurological:     Mental Status: He is alert.  Psychiatric:        Attention and Perception: Attention and perception normal.        Mood and Affect: Mood and affect normal.        Speech: Speech normal.        Thought Content: Thought content normal. Thought content does not include homicidal or suicidal ideation.     Comments: Does not respond to internal stimuli on exam. Speech is appropriate.     ED Results / Procedures / Treatments   Labs (all labs ordered are listed, but only abnormal results are displayed) Labs Reviewed  RESP PANEL BY RT-PCR (FLU A&B, COVID) ARPGX2 - Abnormal; Notable for the following components:      Result Value   SARS Coronavirus 2 by RT PCR POSITIVE (*)    All other components within normal limits  COMPREHENSIVE METABOLIC PANEL - Abnormal; Notable for the following components:   Sodium 134 (*)    Potassium 3.4 (*)    Glucose, Bld 103 (*)    Calcium 8.4 (*)    AST 48 (*)    All other components within normal limits  RAPID URINE DRUG SCREEN, HOSP PERFORMED - Abnormal; Notable for the following components:   Cocaine POSITIVE (*)    All other components within normal limits  CBC WITH DIFFERENTIAL/PLATELET - Abnormal; Notable for the following components:   Hemoglobin 12.8 (*)    HCT 37.7 (*)    Platelets 140 (*)    Lymphs Abs 0.3 (*)    Monocytes  Absolute 1.5 (*)    All other components within normal limits  D-DIMER, QUANTITATIVE - Abnormal; Notable for the following components:   D-Dimer, Quant 0.67 (*)    All other components within normal limits  ETHANOL    EKG EKG Interpretation  Date/Time:  Sunday February 04 2021 12:28:34 EDT Ventricular Rate:  106 PR Interval:  154 QRS Duration: 88 QT Interval:  356 QTC Calculation: 472 R Axis:   -11 Text Interpretation: Sinus tachycardia Otherwise normal ECG Confirmed by Noemi Chapel [phone]) on 02/04/2021 8:11:51 PM  Radiology MR Lumbar Spine W Wo Contrast  Result Date: 02/03/2021 CLINICAL DATA:  Low back pain, cauda equina syndrome suspected. Infection suspected. No recent injury. No previous relevant surgery. EXAM: MRI LUMBAR SPINE WITHOUT AND WITH CONTRAST TECHNIQUE: Multiplanar and multiecho pulse sequences of the lumbar spine were obtained without and with intravenous contrast. Post-contrast imaging was obtained as ordered. CONTRAST:  34m GADAVIST GADOBUTROL 1 MMOL/ML IV SOLN COMPARISON:  Lumbar MRI 09/15/2019.  Abdominopelvic CT 12/17/2019. FINDINGS: Despite efforts by the technologist and patient, mild motion artifact is present on today's exam and could not be eliminated. This reduces exam sensitivity and specificity. Segmentation: Conventional anatomy assumed, with the last open disc space designated L5-S1.Concordant with previous imaging. Alignment:  Stable minimal retrolisthesis at L5-S1. Vertebrae: No worrisome osseous lesion, acute fracture or pars defect. No abnormal osseous enhancement. No evidence of discitis or osteomyelitis. The visualized sacroiliac joints appear unremarkable. The spinal canal is relatively small on a congenital basis. Conus medullaris: Extends to the L1-2 level and appears normal.  Possible thin enhancement of the right S1 nerve root in the canal, best seen on the sagittal postcontrast images. No nerve root enlargement or nodularity identified. Paraspinal  and other soft tissues: No significant paraspinal findings. Disc levels: From T12-L1 through L4-5, the discs remain well hydrated with preserved height. There is no disc herniation or significant spinal stenosis. Mild facet hypertrophy at L4-5. L5-S1: Stable disc desiccation with bulging and a shallow central disc protrusion. No sacral nerve root encroachment. Stable mild facet hypertrophy and foraminal narrowing bilaterally without nerve root encroachment. IMPRESSION: 1. Possible thin enhancement of the right S1 nerve root in the canal, a nonspecific finding which can be seen with inflammation. No nerve root enlargement or nodularity identified. The conus medullaris and cauda quadrant otherwise appear unremarkable. 2. No evidence of discitis or osteomyelitis. 3. Stable mild spondylosis with a shallow central disc protrusion at L5-S1, not causing nerve root encroachment. Electronically Signed   By: Richardean Sale M.D.   On: 02/03/2021 13:47    Procedures Procedures   Medications Ordered in ED Medications  acetaminophen (TYLENOL) tablet 1,000 mg (1,000 mg Oral Given 02/04/21 1818)  methocarbamol (ROBAXIN) tablet 500 mg (500 mg Oral Given 02/04/21 1818)  LORazepam (ATIVAN) tablet 1 mg (1 mg Oral Given 02/04/21 2103)    ED Course  I have reviewed the triage vital signs and the nursing notes.  Pertinent labs & imaging results that were available during my care of the patient were reviewed by me and considered in my medical decision making (see chart for details).    MDM Rules/Calculators/A&P                          Patient stable and well appearing on exam. Ostomy is c/d/I. Abdomen is soft and non-tender. TTP of lumbar spine in the midline and on the right. Workup yesterday with MRI of the lumbar spine that revealed herniated disc. NO evidence of infection or cauda equina. Pain poorly controlled as patient is unable to fill his medications given at discharge. No indication for further workup.  Needs ortho or neurosurgery follow up on outpatient basis. Pain treated with Tylenol and Robaxin and lidocaine patch.   Has a history of schizoaffective disorder and UDS positive for cocaine, both likely contributing to hallucinations. Has not taken psychiatric medications for several days as he is out. Has no SI or HI. Does not appear to be a danger to himself or others. Not responding to internal stimuli on exam. Thoughts are organized. Intermittently becomes agitated likely 2/2 cocaine use but is not acutely manic. Given 1 mg oral ativan for agitation.   Does complain of some SOB and is tachycardic so d-dimer sent along with covid test. Sinus tachycardic on EKG. No acute ischemic changes. Lungs are clear with good air movement. No evidence of pneumonia. Covid positive. D-dimer mildly positive, but likely in the setting of covid infection. No chest pain. No hypoxia with ambulation. Tachycardia likely 2/2 cocaine ingestion. Low suspicion for PE.   On reassessment patient continues to have linear thought. Continues to deny SI and HI. Pain is improved and he ambulates around the room without difficulty. Sats are 100% on RA with ambulation. He is stable for discharge. Medications refilled including robaxin, prednisone, and psychiatric meds. Case management sent payment to pharmacy to help with medication costs.   Given return precautions. Discharged in stable condition.   Final Clinical Impression(s) / ED Diagnoses Final diagnoses:  Cocaine intoxication with perceptual  disturbance (HCC)  Medication nonadherence due to psychosocial problem  Chronic midline low back pain without sciatica    Rx / DC Orders ED Discharge Orders          Ordered    QUEtiapine (SEROQUEL) 300 MG tablet  Daily at bedtime        02/04/21 2022    divalproex (DEPAKOTE) 500 MG DR tablet  Nightly        02/04/21 2022    QUEtiapine (SEROQUEL) 50 MG tablet  Daily        02/04/21 2022    DULoxetine (CYMBALTA) 60 MG capsule   2 times daily        02/04/21 2022    predniSONE (STERAPRED UNI-PAK 21 TAB) 10 MG (21) TBPK tablet  Daily        02/04/21 2022             Coralee Pesa, MD 02/05/21 1224    Noemi Chapel, MD 02/05/21 2025

## 2021-02-05 ENCOUNTER — Other Ambulatory Visit: Payer: Self-pay

## 2021-02-05 ENCOUNTER — Emergency Department
Admission: EM | Admit: 2021-02-05 | Discharge: 2021-02-07 | Disposition: A | Discharge status: Home / Self Care | Payer: No Payment, Other | Attending: Emergency Medicine | Admitting: Emergency Medicine

## 2021-02-05 ENCOUNTER — Ambulatory Visit (HOSPITAL_COMMUNITY)
Admission: EM | Admit: 2021-02-05 | Discharge: 2021-02-05 | Disposition: A | Discharge status: Home / Self Care | Payer: No Payment, Other | Attending: Psychiatry | Admitting: Psychiatry

## 2021-02-05 DIAGNOSIS — F319 Bipolar disorder, unspecified: Secondary | ICD-10-CM | POA: Diagnosis not present

## 2021-02-05 DIAGNOSIS — R45851 Suicidal ideations: Secondary | ICD-10-CM | POA: Insufficient documentation

## 2021-02-05 DIAGNOSIS — F3131 Bipolar disorder, current episode depressed, mild: Secondary | ICD-10-CM | POA: Diagnosis present

## 2021-02-05 DIAGNOSIS — K50919 Crohn's disease, unspecified, with unspecified complications: Secondary | ICD-10-CM | POA: Diagnosis present

## 2021-02-05 DIAGNOSIS — T50902A Poisoning by unspecified drugs, medicaments and biological substances, intentional self-harm, initial encounter: Secondary | ICD-10-CM | POA: Diagnosis present

## 2021-02-05 DIAGNOSIS — F329 Major depressive disorder, single episode, unspecified: Secondary | ICD-10-CM | POA: Diagnosis present

## 2021-02-05 DIAGNOSIS — F209 Schizophrenia, unspecified: Secondary | ICD-10-CM

## 2021-02-05 DIAGNOSIS — Z933 Colostomy status: Secondary | ICD-10-CM | POA: Insufficient documentation

## 2021-02-05 DIAGNOSIS — T50901A Poisoning by unspecified drugs, medicaments and biological substances, accidental (unintentional), initial encounter: Secondary | ICD-10-CM | POA: Diagnosis present

## 2021-02-05 DIAGNOSIS — F3132 Bipolar disorder, current episode depressed, moderate: Secondary | ICD-10-CM | POA: Insufficient documentation

## 2021-02-05 DIAGNOSIS — Z85038 Personal history of other malignant neoplasm of large intestine: Secondary | ICD-10-CM | POA: Diagnosis present

## 2021-02-05 DIAGNOSIS — F250 Schizoaffective disorder, bipolar type: Secondary | ICD-10-CM | POA: Insufficient documentation

## 2021-02-05 DIAGNOSIS — Y900 Blood alcohol level of less than 20 mg/100 ml: Secondary | ICD-10-CM | POA: Insufficient documentation

## 2021-02-05 DIAGNOSIS — F4310 Post-traumatic stress disorder, unspecified: Secondary | ICD-10-CM | POA: Diagnosis present

## 2021-02-05 DIAGNOSIS — F411 Generalized anxiety disorder: Secondary | ICD-10-CM | POA: Diagnosis present

## 2021-02-05 DIAGNOSIS — Z79899 Other long term (current) drug therapy: Secondary | ICD-10-CM | POA: Insufficient documentation

## 2021-02-05 DIAGNOSIS — Z5900 Homelessness unspecified: Secondary | ICD-10-CM

## 2021-02-05 DIAGNOSIS — F314 Bipolar disorder, current episode depressed, severe, without psychotic features: Secondary | ICD-10-CM | POA: Diagnosis present

## 2021-02-05 DIAGNOSIS — U071 COVID-19: Secondary | ICD-10-CM

## 2021-02-05 DIAGNOSIS — F17210 Nicotine dependence, cigarettes, uncomplicated: Secondary | ICD-10-CM | POA: Diagnosis present

## 2021-02-05 LAB — CBC
HCT: 36.3 % — ABNORMAL LOW (ref 39.0–52.0)
Hemoglobin: 12.8 g/dL — ABNORMAL LOW (ref 13.0–17.0)
MCH: 31.4 pg (ref 26.0–34.0)
MCHC: 35.3 g/dL (ref 30.0–36.0)
MCV: 89.2 fL (ref 80.0–100.0)
Platelets: 136 10*3/uL — ABNORMAL LOW (ref 150–400)
RBC: 4.07 MIL/uL — ABNORMAL LOW (ref 4.22–5.81)
RDW: 12.3 % (ref 11.5–15.5)
WBC: 3.5 10*3/uL — ABNORMAL LOW (ref 4.0–10.5)
nRBC: 0 % (ref 0.0–0.2)

## 2021-02-05 LAB — COMPREHENSIVE METABOLIC PANEL
ALT: 40 U/L (ref 0–44)
AST: 54 U/L — ABNORMAL HIGH (ref 15–41)
Albumin: 4.1 g/dL (ref 3.5–5.0)
Alkaline Phosphatase: 98 U/L (ref 38–126)
Anion gap: 9 (ref 5–15)
BUN: 20 mg/dL (ref 6–20)
CO2: 29 mmol/L (ref 22–32)
Calcium: 8.5 mg/dL — ABNORMAL LOW (ref 8.9–10.3)
Chloride: 98 mmol/L (ref 98–111)
Creatinine, Ser: 1.47 mg/dL — ABNORMAL HIGH (ref 0.61–1.24)
GFR, Estimated: >60 mL/min (ref >60)
Glucose, Bld: 105 mg/dL — ABNORMAL HIGH (ref 70–99)
Potassium: 3 mmol/L — ABNORMAL LOW (ref 3.5–5.1)
Sodium: 136 mmol/L (ref 135–145)
Total Bilirubin: 0.9 mg/dL (ref 0.3–1.2)
Total Protein: 8.2 g/dL — ABNORMAL HIGH (ref 6.5–8.1)

## 2021-02-05 LAB — ETHANOL: Alcohol, Ethyl (B): <10 mg/dL (ref <10)

## 2021-02-05 MED ORDER — LORAZEPAM 2 MG/ML IJ SOLN
2.0000 mg | Freq: Once | INTRAMUSCULAR | Status: DC
  Filled 2021-02-05: qty 1

## 2021-02-05 MED ORDER — LORAZEPAM 2 MG/ML IJ SOLN: 2.0000 mg | Freq: Once | INTRAMUSCULAR | Status: DC

## 2021-02-05 MED ORDER — ZIPRASIDONE MESYLATE 20 MG IM SOLR
20.0000 mg | Freq: Once | INTRAMUSCULAR | Status: DC
  Filled 2021-02-05: qty 20

## 2021-02-05 MED ORDER — ZIPRASIDONE MESYLATE 20 MG IM SOLR
20.0000 mg | Freq: Once | INTRAMUSCULAR | Status: AC
  Administered 2021-02-05: 20 mg via INTRAMUSCULAR

## 2021-02-05 NOTE — ED Provider Notes (Signed)
[facility] Emergency Department Provider Note  ____________________________________________  Time seen: Approximately 11:19 PM  I have reviewed the triage vital signs and the nursing notes.   HISTORY  Chief Complaint Psychiatric Evaluation    HPI Juan Hester is a 40 y.o. male with a history of Crohn's disease and colostomy, bipolar disorder, homelessness who comes ED complaining of inability to sleep for the last 6 days and insufficient access to food.  Denies HI or SI.  Reports some vague minor auditory hallucinations which are chronic and at baseline for him.  Complains of chronic low back pain from herniated disc which has previously had MRI.  He was seen at [name] ED yesterday, seen by psychiatry who cleared him for discharge at that time.  He was assisted in obtaining his medications which he has with him.  He reports taking them as prescribed today.  He reports that he has come to the ED because he needs help getting some sleep, having some food that is suitable for his Crohn's disease, and assistance with obtaining housing.  He has an act team.    Past Medical History:  Diagnosis Date   Acute renal failure ([place]) 12/16/2019   Anxiety    per pt report   Colon cancer ([place])    Crohn disease ([place])    Depression    per pt report   ETOH abuse 10/16/2019   Foot drop, right    Multiple subsegmental pulmonary emboli without acute cor pulmonale ([place]) 07/21/2019   Myocardial infarction ([place])    PE (pulmonary thromboembolism) ([place])    PTSD (post-traumatic stress disorder)    Thyroid disease      Patient Active Problem List   Diagnosis Date Noted   Suicide attempt by drug overdose ([place]) 05/12/2020   Bipolar I disorder with depression, severe ([place]) 05/10/2020   Overdose 05/03/2020   Chronic pain syndrome 01/31/2020   Homelessness 12/27/2019   Colostomy status ([place]) 12/16/2019   History of pulmonary embolism 12/16/2019   Right foot drop  12/15/2019   GAD (generalized anxiety disorder) 10/20/2019   MDD (major depressive disorder) 10/20/2019   PTSD (post-traumatic stress disorder) 10/20/2019   Bipolar affective disorder, currently depressed, mild ([place]) 10/16/2019   Protrusion of lumbar intervertebral disc 10/05/2019   Bipolar 1 disorder ([place]) 07/21/2019   History of colon cancer 07/21/2019   Schizoaffective disorder, bipolar type ([place]) 06/03/2019   Nicotine dependence, cigarettes, uncomplicated 41/28/7867   Crohn's disease with complication ([place]) 67/20/9470     Past Surgical History:  Procedure Laterality Date   ABDOMINAL SURGERY     COLON SURGERY     IR RADIOLOGIST EVAL & MGMT  08/31/2019   OSTOMY     SMALL INTESTINE SURGERY       Prior to Admission medications   Medication Sig Start Date End Date Taking? Authorizing Provider  Calamine-Zinc Oxide 8-8 % LOTN APPLY 1 APPLICATION TOPICALLY AS NEEDED FOR ITCHING. ON TOP OF RIGHT FOOT Patient not taking: No sig reported 04/24/20 04/24/21  [name], MD  carisoprodol (SOMA) 250 MG tablet Take 250 mg by mouth daily as needed (muscle relaxing).    [provider]  clonazePAM (KLONOPIN) 0.5 MG tablet TAKE 1/2 TABLET BY MOUTH TWO TIMES DAILY Patient taking differently: Take 0.5 mg by mouth daily as needed for anxiety. 12/29/19 01/31/21  [name], MD  cyclobenzaprine (FLEXERIL) 10 MG tablet TAKE 1 TABLET (10 MG TOTAL) BY MOUTH 2 (TWO) TIMES DAILY AS NEEDED FOR MUSCLE SPASMS.  Patient not taking: No sig reported 04/24/20 04/24/21  [name], MD  cyclobenzaprine (FLEXERIL) 10 MG tablet TAKE 1 TABLET (10 MG TOTAL) BY MOUTH 2 (TWO) TIMES DAILY AS NEEDED FOR MUSCLE SPASMS. Patient not taking: No sig reported 03/15/20 03/15/21  [name], MD  diphenoxylate-atropine (LOMOTIL) 2.5-0.025 MG tablet Take 1 tablet by mouth 4 (four) times daily as needed for diarrhea or loose stools.    [provider]  divalproex (DEPAKOTE) 250 MG DR tablet Take  3 tablets (750 mg total) by mouth at bedtime. Patient taking differently: Take 250 mg by mouth every morning. 05/15/20 01/31/21  [name], MD  divalproex (DEPAKOTE) 500 MG DR tablet Take 1 tablet (500 mg total) by mouth daily. Patient not taking: Reported on 01/31/2021 05/16/20 06/15/20  [name], MD  divalproex (DEPAKOTE) 500 MG DR tablet TAKE 1 TABLET (500 MG TOTAL) BY MOUTH 2 (TWO) TIMES DAILY. Patient taking differently: Take 500 mg by mouth at bedtime. 04/19/20 04/19/21  Salley Slaughter, NP  divalproex (DEPAKOTE) 500 MG DR tablet Take 1 tablet (500 mg total) by mouth at bedtime. 02/04/21   Coralee Pesa, MD  DULoxetine (CYMBALTA) 60 MG capsule Take 1 capsule (60 mg total) by mouth daily. Patient taking differently: Take 60 mg by mouth 2 (two) times daily. 04/19/20   Salley Slaughter, NP  DULoxetine (CYMBALTA) 60 MG capsule TAKE 1 CAPSULE (60 MG TOTAL) BY MOUTH DAILY. Patient not taking: No sig reported 04/19/20 04/19/21  Salley Slaughter, NP  DULoxetine (CYMBALTA) 60 MG capsule Take 1 capsule (60 mg total) by mouth 2 (two) times daily. 02/04/21 03/06/21  Coralee Pesa, MD  gabapentin (NEURONTIN) 300 MG capsule Take 2 capsules (600 mg total) by mouth 3 (three) times daily. Patient not taking: No sig reported 05/10/20   Amin, Jeanella Flattery, MD  gabapentin (NEURONTIN) 300 MG capsule TAKE 1 CAPSULE (300 MG TOTAL) BY MOUTH 3 (THREE) TIMES DAILY. Patient not taking: Reported on 01/31/2021 12/29/19 12/28/20  [name], MD  hydrOXYzine (ATARAX/VISTARIL) 10 MG tablet TAKE 1 TABLET (10 MG TOTAL) BY MOUTH 3 (THREE) TIMES DAILY AS NEEDED. Patient not taking: No sig reported 04/24/20 04/24/21  [name], MD  hydrOXYzine (ATARAX/VISTARIL) 10 MG tablet TAKE 1 TABLET (10 MG TOTAL) BY MOUTH 3 (THREE) TIMES DAILY AS NEEDED. Patient not taking: No sig reported 03/15/20 03/15/21  [name], MD  methocarbamol (ROBAXIN) 500 MG tablet Take 1 tablet (500 mg total) by mouth  2 (two) times daily. 02/03/21   Alroy Bailiff, Margaux, PA-C  naloxone [facility]) nasal spray 4 mg/0.1 mL PLACE 1 SPRAY (4 MG TOTAL) INTO THE NOSE AS NEEDED FOR OVERDOSE SYMPTOMS FROM OPIATE. SEE ADMINISTRATION INSTRUCTIONS Patient taking differently: Place 1 spray into the nose once as needed (overdose). 03/15/20 03/15/21  [name], MD  potassium chloride (KLOR-CON) 10 MEQ tablet Take 1 tablet (10 mEq total) by mouth 2 (two) times daily. Patient not taking: No sig reported 06/09/20   Montine Circle, PA-C  potassium chloride (KLOR-CON) 10 MEQ tablet TAKE 1 TABLET (10 MEQ TOTAL) BY MOUTH 2 (TWO) TIMES DAILY. Patient not taking: No sig reported 06/09/20 06/09/21  Montine Circle, PA-C  predniSONE (DELTASONE) 10 MG tablet Take 6 tablets by mouth on day 1, 5 tablets on day 2, 4 tablets on day 3, 3 tabets on day 4, 2 tablets on day 5, take 1 tablet on day 6 02/03/21   Alroy Bailiff, Margaux, PA-C  predniSONE (STERAPRED Eleanora Neighbor  21 TAB) 10 MG (21) TBPK tablet Take by mouth daily. Take 6 tabs by mouth daily  for 2 days, then 5 tabs for 2 days, then 4 tabs for 2 days, then 3 tabs for 2 days, 2 tabs for 2 days, then 1 tab by mouth daily for 2 days 02/04/21   Coralee Pesa, MD  QUEtiapine (SEROQUEL) 300 MG tablet Take 1 tablet (300 mg total) by mouth at bedtime. 05/15/20 06/14/20  [name], MD  QUEtiapine (SEROQUEL) 300 MG tablet TAKE 1 TABLET (300 MG TOTAL) BY MOUTH AT BEDTIME. 12/29/19 12/28/20  [name], MD  QUEtiapine (SEROQUEL) 300 MG tablet Take 1 tablet (300 mg total) by mouth at bedtime. 02/04/21   Coralee Pesa, MD  QUEtiapine (SEROQUEL) 400 MG tablet TAKE 1 TABLET (400 MG TOTAL) BY MOUTH AT BEDTIME. Patient taking differently: Take 400 mg by mouth at bedtime. 04/19/20 04/19/21  Salley Slaughter, NP  QUEtiapine (SEROQUEL) 50 MG tablet Take 1 tablet (50 mg total) by mouth daily at 6 (six) AM. 05/16/20 06/15/20  [name], MD  QUEtiapine (SEROQUEL) 50 MG tablet TAKE 1 TABLET (50 MG  TOTAL) BY MOUTH DAILY. Patient taking differently: Take 50 mg by mouth every morning. 04/19/20 04/19/21  Salley Slaughter, NP  QUEtiapine (SEROQUEL) 50 MG tablet TAKE 1 TABLET (50 MG TOTAL) BY MOUTH 2 (TWO) TIMES DAILY. 12/29/19 12/28/20  [name], MD  QUEtiapine (SEROQUEL) 50 MG tablet Take 1 tablet (50 mg total) by mouth daily. 02/04/21 03/06/21  Coralee Pesa, MD     Allergies Azathioprine, Ciprofloxacin, Mesalamine, Metronidazole, Other, Sulfa antibiotics, Barium sulfate, and Barium-containing compounds   No family history on file.  Social History Social History   Tobacco Use   Smoking status: Every Day    Packs/day: 1.00    Types: Cigarettes   Smokeless tobacco: Never  Vaping Use   Vaping Use: Never used  Substance Use Topics   Alcohol use: Yes    Comment: weekend   Drug use: Not Currently    Review of Systems  Constitutional:   No fever or chills.  ENT:   No sore throat. No rhinorrhea. Cardiovascular:   No chest pain or syncope. Respiratory:   No dyspnea or cough. Gastrointestinal:   Negative for abdominal pain, vomiting and diarrhea.  Musculoskeletal:   Chronic back pain. All other systems reviewed and are negative except as documented above in ROS and HPI.  ____________________________________________   PHYSICAL EXAM:  VITAL SIGNS: ED Triage Vitals  Enc Vitals Group     BP 02/05/21 2126 138/76     Pulse Rate 02/05/21 2126 96     Resp 02/05/21 2126 20     Temp 02/05/21 2126 98.4 F (36.9 C)     Temp Source 02/05/21 2126 Oral     SpO2 02/05/21 2126 100 %     Weight 02/05/21 2127 218 lb 4.1 oz (99 kg)     Height 02/05/21 2127 6' 1"  (1.854 m)     Head Circumference --      Peak Flow --      Pain Score --      Pain Loc --      Pain Edu? --      Excl. in [place]? --     Vital signs reviewed, nursing assessments reviewed.   Constitutional:   Alert and oriented. Non-toxic appearance. Eyes:   Conjunctivae are normal. EOMI. ENT      Head:  Normocephalic and atraumatic.      Mouth/Throat:   MMM      Neck:   No meningismus. Full ROM. Hematological/Lymphatic/Immunilogical:   No cervical lymphadenopathy. Cardiovascular:   RRR.  Respiratory:   Normal respiratory effort without tachypnea/retractions. Musculoskeletal:   Normal range of motion in all extremities.  No midline spinal tenderness Neurologic:   Normal speech and language.  Motor grossly intact. No acute focal neurologic deficits are appreciated.  Skin:    Skin is warm, dry and intact. No rash noted.  No wounds.  ____________________________________________    LABS (pertinent positives/negatives) (all labs ordered are listed, but only abnormal results are displayed) Labs Reviewed  CBC - Abnormal; Notable for the following components:      Result Value   WBC 3.5 (*)    RBC 4.07 (*)    Hemoglobin 12.8 (*)    HCT 36.3 (*)    Platelets 136 (*)    All other components within normal limits  COMPREHENSIVE METABOLIC PANEL - Abnormal; Notable for the following components:   Potassium 3.0 (*)    Glucose, Bld 105 (*)    Creatinine, Ser 1.47 (*)    Calcium 8.5 (*)    Total Protein 8.2 (*)    AST 54 (*)    All other components within normal limits  ETHANOL  URINALYSIS, COMPLETE (UACMP) WITH MICROSCOPIC  URINE DRUG SCREEN, QUALITATIVE ([facility] ONLY)   ____________________________________________   EKG  ____________________________________________    RADIOLOGY  No results found.  ____________________________________________   PROCEDURES Procedures  ____________________________________________  CLINICAL IMPRESSION / ASSESSMENT AND PLAN / ED COURSE  Pertinent labs & imaging results that were available during my care of the patient were reviewed by me and considered in my medical decision making (see chart for details).  Guerino Caporale was evaluated in Emergency Department on 02/05/2021 for the symptoms described in the history of present illness. He was  evaluated in the context of the global COVID-19 pandemic, which necessitated consideration that the patient might be at risk for infection with the SARS-CoV-2 virus that causes COVID-19. Institutional protocols and algorithms that pertain to the evaluation of patients at risk for COVID-19 are in a state of rapid change based on information released by regulatory bodies including the CDC and federal and state organizations. These policies and algorithms were followed during the patient's care in the ED.   Patient presents to the ED with social concerns.  He appears to be at his baseline state of health medically and psychiatrically.  Seen by psych team confines no acute issues.  They have discussed with his act team who will obtain temporary housing for him tomorrow morning.  We will give the patient some additional medications tonight and provide supportive care due to his lack of resources and plan for discharge in the morning.      ____________________________________________   FINAL CLINICAL IMPRESSION(S) / ED DIAGNOSES    Final diagnoses:  Schizophrenia, unspecified type [facility])     ED Discharge Orders     None       Portions of this note were generated with dragon dictation software. Dictation errors may occur despite best attempts at proofreading.   Carrie Mew, MD 02/05/21 [phone]

## 2021-02-05 NOTE — ED Notes (Signed)
EDP [place] at bedside. This RN and John NT will dress pt out when EDP finished evaluating pt.

## 2021-02-05 NOTE — ED Notes (Signed)
[employer] dispatch called for transport home.

## 2021-02-05 NOTE — ED Notes (Signed)
Blood-work collected by [facility] NT.

## 2021-02-05 NOTE — BH Assessment (Signed)
Comprehensive Clinical Assessment (CCA) Note  02/05/2021 Juan Hester [id number]  Chief Complaint: Patient is a 40 year old male presenting to [facility] ED voluntarily. Per triage note Pt here to see psychiatrist, pt unable to describe symptoms or concerns. Pt denies any HI or SI. During assessment patient appeared alert and oriented x4, calm and cooperative. Patient reports "I keep saying I'm suicidal and I keep getting turned away, since the 19th I've been trying to communicate to this to see the doctors, I'm at my whits end." Patient reports that he tried to commit suicide in January "I made me a cocktail and I tried to die." Patient reports that he is currently homeless and has been since 01/30/21 "I'm almost 6 days with no sleep." Patient continues to report SI, denies HI/AH/VH. Patient does currently have a ACT team with [facility] in [place].This Probation officer contacted patient's ACT team and spoke with Barnabas Lister who reports that they will be locating temporary housing for the patient tomorrow. ACT team contact is [phone]  Per Psyc NP Ysidro Evert patient to be discharged in the morning Chief Complaint  Patient presents with   Psychiatric Evaluation   Visit Diagnosis: Bipolar disorder    CCA Screening, Triage and Referral (STR)  Patient Reported Information How did you hear about [place]? Self  Referral name: No data recorded Referral phone number: No data recorded  Whom do you see for routine medical problems? No data recorded Practice/Facility Name: No data recorded Practice/Facility Phone Number: No data recorded Name of Contact: No data recorded Contact Number: No data recorded Contact Fax Number: No data recorded Prescriber Name: No data recorded Prescriber Address (if known): No data recorded  What Is the Reason for Your Visit/Call Today? Patient presents voluntarily due to depression and homelessness  How Long Has This Been Causing You Problems? > than 6  months  What Do You Feel Would Help You the Most Today? Housing Assistance; Treatment for Depression or other mood problem   Have You Recently Been in Any Inpatient Treatment (Hospital/Detox/Crisis Center/28-Day Program)? No data recorded Name/Location of Program/Hospital:No data recorded How Long Were You There? No data recorded When Were You Discharged? No data recorded  Have You Ever Received Services From [facility] Before? No data recorded Who Do You See at [facility]? No data recorded  Have You Recently Had Any Thoughts About Hurting Yourself? Yes  Are You Planning to Commit Suicide/Harm Yourself At This time? No   Have you Recently Had Thoughts About [place]? No  Explanation: No data recorded  Have You Used Any Alcohol or Drugs in the Past 24 Hours? No  How Long Ago Did You Use Drugs or Alcohol? No data recorded What Did You Use and How Much? Pt denies   Do You Currently Have a Therapist/Psychiatrist? Yes  Name of Therapist/Psychiatrist: [facility] team   Have You Been Recently Discharged From Any Office Practice or Programs? No  Explanation of Discharge From Practice/Program: No data recorded    CCA Screening Triage Referral Assessment Type of Contact: Face-to-Face  Is this Initial or Reassessment? No data recorded Date Telepsych consult ordered in [facility]:  No data recorded Time Telepsych consult ordered in [facility]:  No data recorded  Patient Reported Information Reviewed? No data recorded Patient Left Without Being Seen? No data recorded Reason for Not Completing Assessment: No data recorded  Collateral Involvement: NA   Does Patient Have a [place]? No data recorded Name and Contact of Legal  Guardian: No data recorded If Minor and Not Living with Parent(s), Who has Custody? No data recorded Is CPS involved or ever been involved? Never  Is APS involved or ever been involved? Never   Patient  Determined To Be At Risk for Harm To Self or Others Based on Review of Patient Reported Information or Presenting Complaint? No  Method: No data recorded Availability of Means: No data recorded Intent: No data recorded Notification Required: No data recorded Additional Information for Danger to Others Potential: No data recorded Additional Comments for Danger to Others Potential: No data recorded Are There Guns or Other Weapons in Your Home? No data recorded Types of Guns/Weapons: No data recorded Are These Weapons Safely Secured?                            No data recorded Who Could Verify You Are Able To Have These Secured: No data recorded Do You Have any Outstanding Charges, Pending Court Dates, Parole/Probation? No data recorded Contacted To Inform of Risk of Harm To Self or Others: No data recorded  Location of Assessment: [facility] ED   Does Patient Present under Involuntary Commitment? No  IVC Papers Initial File Date: No data recorded  [place] of Residence: [place]   Patient Currently Receiving the Following Services: ACTT Architect)   Determination of Need: Emergent (2 hours)   Options For Referral: Medication Management; Outpatient Therapy     CCA Biopsychosocial Intake/Chief Complaint:  No data recorded Current Symptoms/Problems: No data recorded  Patient Reported Schizophrenia/Schizoaffective Diagnosis in Past: No   Strengths: Patient is able to communicate  Preferences: No data recorded Abilities: No data recorded  Type of Services Patient Feels are Needed: No data recorded  Initial Clinical Notes/Concerns: No data recorded  Mental Health Symptoms Depression:   Change in energy/activity; Hopelessness; Worthlessness   Duration of Depressive symptoms:  Greater than two weeks   Mania:   None   Anxiety:    Difficulty concentrating; Irritability; Restlessness   Psychosis:   None   Duration of Psychotic symptoms: No data  recorded  Trauma:   None   Obsessions:   None   Compulsions:   None   Inattention:   None   Hyperactivity/Impulsivity:   None   Oppositional/Defiant Behaviors:   None   Emotional Irregularity:   None   Other Mood/Personality Symptoms:   Pressured speech    Mental Status Exam Appearance and self-care  Stature:   Tall   Weight:   Overweight   Clothing:   Disheveled   Grooming:   Neglected   Cosmetic use:   None   Posture/gait:   Normal   Motor activity:   Not Remarkable   Sensorium  Attention:   Normal   Concentration:   Normal   Orientation:   X5   Recall/memory:   Normal   Affect and Mood  Affect:   Anxious; Depressed   Mood:   Depressed; Anxious   Relating  Eye contact:   Normal   Facial expression:   Anxious   Attitude toward examiner:   Cooperative   Thought and Language  Speech flow:  Clear and Coherent   Thought content:   Appropriate to Mood and Circumstances   Preoccupation:   None   Hallucinations:   None   Organization:  No data recorded  [employer] of Knowledge:   Fair   Intelligence:   Average   Abstraction:  Normal   Judgement:   Fair   Art therapist:   Realistic   Insight:   Fair   Decision Making:   Normal   Social Functioning  Social Maturity:   Responsible   Social Judgement:   Normal   Stress  Stressors:   Housing; Teacher, music Ability:   Normal   Skill Deficits:   None   Supports:   Friends/Service system     Religion: Religion/Spirituality Are You A Religious Person?:  Radiation protection practitioner)  Leisure/Recreation: Leisure / Recreation Do You Have Hobbies?: No  Exercise/Diet: Exercise/Diet Do You Exercise?: No Have You Gained or Lost A Significant Amount of Weight in the Past Six Months?: No Do You Follow a Special Diet?: No Do You Have Any Trouble Sleeping?: Yes Explanation of Sleeping Difficulties: Patient reports that he hasn't slept in 6  days   CCA Employment/Education Employment/Work Situation: Employment / Work Situation Employment Situation: Unemployed Has Patient ever Been in Passenger transport manager?: No  Education: Education Is Patient Currently Attending School?: No Did Physicist, medical?: No Did You Have An Individualized Education Program (IIEP): No Did You Have Any Difficulty At [employer]?: No Patient's Education Has Been Impacted by Current Illness: No   CCA Family/Childhood History Family and Relationship History: Family history Marital status: Single Does patient have children?: No  Childhood History:  Childhood History Did patient suffer any verbal/emotional/physical/sexual abuse as a child?: No Did patient suffer from severe childhood neglect?: No Has patient ever been sexually abused/assaulted/raped as an adolescent or adult?: No Was the patient ever a victim of a crime or a disaster?: No Witnessed domestic violence?: No Has patient been affected by domestic violence as an adult?: No  Child/Adolescent Assessment:     CCA Substance Use Alcohol/Drug Use: Alcohol / Drug Use Pain Medications: See MAR Prescriptions: See MAR Over the Counter: See MAR History of alcohol / drug use?: No history of alcohol / drug abuse                         ASAM's:  Six Dimensions of Multidimensional Assessment  Dimension 1:  Acute Intoxication and/or Withdrawal Potential:      Dimension 2:  Biomedical Conditions and Complications:      Dimension 3:  Emotional, Behavioral, or Cognitive Conditions and Complications:     Dimension 4:  Readiness to Change:     Dimension 5:  Relapse, Continued use, or Continued Problem Potential:     Dimension 6:  Recovery/Living Environment:     ASAM Severity Score:    ASAM Recommended Level of Treatment:     Substance use Disorder (SUD)    Recommendations for Services/Supports/Treatments:  Patient to discharge in the AM  DSM5 Diagnoses: Patient Active Problem List    Diagnosis Date Noted   Suicide attempt by drug overdose ([place]) 05/12/2020   Bipolar I disorder with depression, severe ([place]) 05/10/2020   Overdose 05/03/2020   Chronic pain syndrome 01/31/2020   Homelessness 12/27/2019   Colostomy status ([place]) 12/16/2019   History of pulmonary embolism 12/16/2019   Right foot drop 12/15/2019   GAD (generalized anxiety disorder) 10/20/2019   MDD (major depressive disorder) 10/20/2019   PTSD (post-traumatic stress disorder) 10/20/2019   Bipolar affective disorder, currently depressed, mild ([place]) 10/16/2019   Protrusion of lumbar intervertebral disc 10/05/2019   Bipolar 1 disorder ([place]) 07/21/2019   History of colon cancer 07/21/2019   Schizoaffective disorder, bipolar type ([place]) 06/03/2019   Nicotine dependence, cigarettes,  uncomplicated 81/59/4707   Crohn's disease with complication ([place]) 61/51/8343    Patient Centered Plan: Patient is on the following Treatment Plan(s):  Anxiety, Depression, and Impulse Control   Referrals to Alternative Service(s): Referred to Alternative Service(s):   Place:   Date:   Time:    Referred to Alternative Service(s):   Place:   Date:   Time:    Referred to Alternative Service(s):   Place:   Date:   Time:    Referred to Alternative Service(s):   Place:   Date:   Time:     Keyonta Barradas A Brewster Wolters, LCAS-A

## 2021-02-05 NOTE — ED Notes (Signed)
Pt given xxx-L scrub top as requested; given x-L socks as requested. Psych staff at bedside. Once psych done with eval will dress pt out as he is currently calm and cooperative. Reports hasn't slept in 6 days; pt visibly fatigued; resp reg/unlabored; skin dry.

## 2021-02-05 NOTE — ED Notes (Signed)
Per EDP Stafford verbal hold on meds to give pt one more chance to dress out; states pt currently more agreeable.

## 2021-02-05 NOTE — ED Notes (Signed)
Pt. Was dressed out by this tech and officer ray, pt. Was provided with hospital attire (top/bottom scrubs) belongings were placed in bag with pt personal book bag. Both bags were labeled with pt sticker and green sticker.   Belongings collected were:  Jeans  White long sleeve t-shirt Tan Occupational psychologist of socks [employer] Cell phone [employer] pt still has in room and slippers

## 2021-02-05 NOTE — ED Notes (Signed)
Pt dress-out but refusing to give Juan Hester NT his shoes and cane. Pt stated to NT that he is hearing voices.

## 2021-02-05 NOTE — ED Provider Notes (Signed)
Behavioral Health Urgent Care Medical Screening Exam  Patient Name: Juan Hester MRN: [id number] Date of Evaluation: 02/05/21 Chief Complaint:  I want to something to eat and I have nowhere to go Diagnosis:  Final diagnoses:  Bipolar affective disorder, currently depressed, moderate ([place])    History of Present illness: Juan Hester is a 40 y.o. male with a history of Bipolar Disorder presenting voluntarily to [facility] for passive suicidal ideations. Patient was brought in by [facility] and has had 5 ED visits in the last six months. Patient most recently presented to [name] on 02[facility]04[facility]21 per ED provider note for complains of "hallucinations due to sleep deprivation" and was discharged with community resources and f/u appointment with [facility] on 11/2 @2 :30 for his colostomy supplies. Patient stated that he was recently released from jail on October 18th, 2022 and is currently on probation. Patient reports that he needed something to eat, has not slept and he has no where to go. Patient stated that he has an ACT team[facility], but they have been helpful with linking him to resources for housing or helping him with filing for his disability. Patient reports that he has not taken his medications since he was discharged from jail on October 18th. Patient expressed some vague suicidal ideation without a plan or intent. Patient is very frustrated that he is not receiving the services from  ACT team members to assist him with his mental health and medical needs. Patient is alert oriented x3, calm and cooperative with a flat, blunted affect. Pt is stable and psychiatrically cleared for discharge.     Psychiatric Specialty Exam  Presentation  General Appearance:Appropriate for Environment; Casual  Eye Contact:No data recorded Speech:Clear and Coherent  Speech Volume:Normal  Handedness:Right   Mood and Affect   Mood:Depressed  Affect:Flat   Thought Process  Thought Processes:Coherent  Descriptions of Associations:Intact  Orientation:Full (Time, Place and Person)  Thought Content:No data recorded Diagnosis of Schizophrenia or Schizoaffective disorder in past: No   Hallucinations:None  Ideas of Reference:None  Suicidal Thoughts:Yes, Passive  Homicidal Thoughts:No   Sensorium  Memory:Immediate Fair; Recent Fair; Remote Fair  Judgment:Fair  Insight:Fair   Executive Functions  Concentration:Fair  Attention Span:Fair  [place]   Psychomotor Activity  Psychomotor Activity:Normal   Assets  Assets:Communication Skills; Desire for Improvement; Resilience   Sleep  Sleep:Poor  Number of hours: -1   No data recorded  Physical Exam: Physical Exam HENT:     Head: Normocephalic and atraumatic.     Nose: Nose normal.  Eyes:     Pupils: Pupils are equal, round, and reactive to light.  Cardiovascular:     Rate and Rhythm: Tachycardia present.  Pulmonary:     Effort: Pulmonary effort is normal.  Abdominal:     General: Abdomen is flat.  Musculoskeletal:     Cervical back: Normal range of motion.  Skin:    General: Skin is warm.  Neurological:     Mental Status: He is alert and oriented to person, place, and time.  Psychiatric:        Attention and Perception: Attention normal.        Mood and Affect: Mood is depressed.        Speech: Speech normal.        Behavior: Behavior normal.        Thought Content: Thought content includes suicidal ideation.        Cognition and  Memory: Cognition normal.        Judgment: Judgment normal.   Review of Systems  Constitutional: Negative.   HENT: Negative.    Eyes: Negative.   Respiratory: Negative.    Cardiovascular: Negative.   Gastrointestinal: Negative.   Genitourinary: Negative.   Musculoskeletal: Negative.   Skin: Negative.   Neurological: Negative.    Endo/Heme/Allergies: Negative.   Psychiatric/Behavioral:  Positive for depression and suicidal ideas.   Blood pressure (!) 120/106, pulse (!) 124, temperature 98.8 F (37.1 C), resp. rate 18, SpO2 100 %. There is no height or weight on file to calculate BMI.  Musculoskeletal: Strength & Muscle Tone: within normal limits Gait & Station: normal Patient leans: N/A   [place] MSE Discharge Disposition for Follow up and Recommendations: Based on my evaluation the patient does not appear to have an emergency medical condition and can be discharged with resources and follow up care in outpatient services for Medication Management and Individual Therapy  Based on my assessment and TTS assessment patient can be discharged with community resources. Recommended patient f/u with his ACT Team [facility]. Patient has an appointment with [facility] on 11/2 @2 :30.     Lucia Bitter, NP 02/05/2021, 6:36 AM

## 2021-02-05 NOTE — ED Notes (Signed)
Pt willing to give cane to staff after education though not without voicing frustration and irritation. Pt unwilling to give up sandals. Charge RN Lorriane Shire notified via phone as security might need to be involved if meds have to be given to obtain sandals. Pt reports that sandals are only shoes that allow him decent comfort when he is walking around as reports history of back issues. Pt notified cane with security officer who is right outside door; pt notified anytime he needs it he can call out to security personnel who will give it to him as needed.

## 2021-02-05 NOTE — ED Notes (Addendum)
Pt refused to be dressed out by this RN and Jenny Reichmann NT. Pt requesting 2nd male to complete dress-out process. Male security officer to room to switch out with this RN. Pt's colostomy currently empty; offered new bag in case pt wants to change it as states he normally takes care of it himself; pt declined. Pt's abdomen distended; denies that it is taut or tender.

## 2021-02-05 NOTE — Progress Notes (Signed)
TRIAGE: ROUTINE  Juan Najjar, NP, reviewed pt's chart and information and met with pt face-to-face and determined pt can be psych cleared.   02/05/21 0404  [place] Triage Screening (Walk-ins at [facility] only)  How Did You Hear About [place]? Legal System  What Is the Reason for Your Visit/Call Today? Pt states he called the police for assistance because he has a plan to kill himself by o/d on fentanyl. Pt states he is suffering from sleep deprivation and that he has had insomnia for 15 years. Pt endorses current SI, a hx of SI, a hx of attempting to kill himself (the last incident of which was in January 2022 by hanging (though his chart states by o/d)),and prior hospitalizations for mental health concerns (the most recent of which was at [facility] from 05/10/2020 - 05/15/2020). Pt denies HI, NSSSIB, access to guns/weapons ("I'm a felon, I can't."). He states he has been experiencing AVH. He has a court hearing on March 01, 2021 to drop the charges that he is on probation for (he states that in 2016 he held a gun up to his head to kill himself and it misfired, thus scaring his wife and children). Pt denies SA, though he states he used to have a medical marijuana card; he also shares that yesterday he took a sip of someone's water and he later tested positive for cocaine. Of note, pt was seen and d/c on 02/04/2021 for cocaine intoxification, on 02/03/2021 for lumbar herniated disk, and on 01/31/2021 for SI. Pt tested positive for COVID in the ED last night (02/04/2021). Pt shares he has a medical marijuana cart but that it was taken by his probation officer b/c it is not recognized in [place].  How Long Has This Been Causing You Problems? 1 wk - 1 month  Have You Recently Had Any Thoughts About Hurting Yourself? Yes  How long ago did you have thoughts about hurting yourself? Currently  Are You Planning to Commit Suicide/Harm Yourself At This time? Yes  Have you Recently Had Thoughts About Hurting Someone Gu[place]upe Dawn? No  Are You  Planning To Harm Someone At This Time? No  Are you currently experiencing any auditory, visual or other hallucinations? Yes  Please explain the hallucinations you are currently experiencing: Pt shares he has been experiencing both AH and VH; he does not describe the AH, though he states it appears as if everything is in a deep fog.  Have You Used Any Alcohol or Drugs in the Past 24 Hours? No  What Did You Use and How Much? Pt denies  Do you have any current medical co-morbidities that require immediate attention? Yes  Please describe current medical co-morbidities that require immediate attention: Pt has a colostomy bag that he says is leaking; he has no supplies in which to change it.  Clinician description of patient physical appearance/behavior: Pt is cooperative and is able to follow requests to demonstrate his ability to walk (slow, labored, and one step at a time). Without request to do so, pt shows clinician and security his colostomy bag multiple times. Pt answers the questions posed and is able to express his thoughts, feelings, and concerns. Pt notes multiple times that no one is helping him despite his needs.  What Do You Feel Would Help You the Most Today? Treatment for Depression or other mood problem;Medication(s);Food Assistance;Housing Assistance  If access to [facility] was not available, would you have sought care in the Emergency Department? Yes  Determination of Need Routine (  7 days)  Options For Referral Medication Management;Outpatient Therapy

## 2021-02-05 NOTE — Discharge Instructions (Addendum)
  Discharge recommendations:  Patient is to take medications as prescribed. Please see information for follow-up appointment with psychiatry and therapy. Please follow up with your primary care provider for all medical related needs.  Please attend follow up appointment with [facility] on 11/2 @2 :30 Why: Time 2:30, ER Follow-up, PCP establishment Help gettin colostomy supplies Contact: [address] [place] [phone]  [facility] ACT Team [facility], [address] [place], [place] [address] [phone] Crisis number: [phone]   Therapy: We recommend that patient participate in individual therapy to address mental health concerns.  Medications: The parent/guardian is to contact a medical professional and/or outpatient provider to address any new side effects that develop. Parent/guardian should update outpatient providers of any new medications and/or medication changes.   Atypical antipsychotics: If you are prescribed an atypical antipsychotic, it is recommended that your height, weight, BMI, blood pressure, fasting lipid panel, and fasting blood sugar be monitored by your outpatient providers.  Safety:  The patient should abstain from use of illicit substances/drugs and abuse of any medications. If symptoms worsen or do not continue to improve or if the patient becomes actively suicidal or homicidal then it is recommended that the patient return to the closest hospital emergency department, the [facility], or call 911 for further evaluation and treatment. National Suicide Prevention Lifeline 1-800-SUICIDE or (647) 555-6900.  About 988 988 offers 24/7 access to trained crisis counselors who can help people experiencing mental health-related distress. People can call or text 988 or chat 988lifeline.org for themselves or if they are worried about a loved  one who may need crisis support.

## 2021-02-05 NOTE — ED Notes (Signed)
Patient has a colostomy - provider made aware of this facility's inability to care for a patient with a colostomy

## 2021-02-05 NOTE — ED Triage Notes (Signed)
Pt here to see psychiatrist, pt unable to describe symptoms or concerns. Pt denies any HI or SI.

## 2021-02-05 NOTE — ED Notes (Signed)
Writer to room by request of Metta Clines, RN due to pt not cooperating. Pt calm and cooperative with Probation officer while explaining plan of care and update on pts care. Pt informed that ACT team was contacted by TTS and the plan is that ACT team will set up temporary housing for pt on 10/25. Tonight pt to rest and be ready for discharge with ACT tomorrow. Pt provides verbal feedback of understanding and request food, drink and warm blanket as well as medication to help with insomnia. Per pt, he has been awake x6 days.   Meal tray provided with [place] sandwich, chips and graham crackers with orange juice. Blankets provided and lights turned down for comfort.

## 2021-02-05 NOTE — ED Notes (Signed)
Raquel RN in triage states was unable to dress pt out in triage as pt physically refusing; may need meds to help calm pt enough to dress out. Olga notified.

## 2021-02-06 ENCOUNTER — Telehealth (HOSPITAL_COMMUNITY): Payer: Self-pay

## 2021-02-06 DIAGNOSIS — F319 Bipolar disorder, unspecified: Secondary | ICD-10-CM

## 2021-02-06 MED ORDER — QUETIAPINE FUMARATE 300 MG PO TABS
300.0000 mg | ORAL_TABLET | Freq: Every day | ORAL | Status: DC
  Administered 2021-02-06: 300 mg via ORAL
  Filled 2021-02-06: qty 1

## 2021-02-06 MED ORDER — ACETAMINOPHEN 325 MG PO TABS: 650.0000 mg | ORAL_TABLET | Freq: Once | ORAL | Status: DC

## 2021-02-06 MED ORDER — DIVALPROEX SODIUM 500 MG PO DR TAB
500.0000 mg | DELAYED_RELEASE_TABLET | Freq: Every day | ORAL | Status: DC
  Administered 2021-02-06: 500 mg via ORAL
  Filled 2021-02-06: qty 1

## 2021-02-06 MED ORDER — QUETIAPINE FUMARATE 25 MG PO TABS
50.0000 mg | ORAL_TABLET | Freq: Every day | ORAL | Status: DC
  Filled 2021-02-06 (×2): qty 2

## 2021-02-06 MED ORDER — DULOXETINE HCL 60 MG PO CPEP
60.0000 mg | ORAL_CAPSULE | Freq: Two times a day (BID) | ORAL | Status: DC
  Administered 2021-02-06 – 2021-02-07 (×2): 60 mg via ORAL
  Filled 2021-02-06 (×2): qty 1

## 2021-02-06 MED ORDER — DIVALPROEX SODIUM 250 MG PO DR TAB
250.0000 mg | DELAYED_RELEASE_TABLET | Freq: Every morning | ORAL | Status: DC
  Administered 2021-02-07: 250 mg via ORAL
  Filled 2021-02-06: qty 1

## 2021-02-06 NOTE — ED Notes (Signed)
RN attempting to contact [facility] since pt has confirmed space there tonight. Unable to reach them. RN attempting to arrange transport.

## 2021-02-06 NOTE — BH Assessment (Signed)
Writer left message on voicemail for [phone] (ACT Team) to return call.

## 2021-02-06 NOTE — ED Notes (Addendum)
Pt requesting regular medication to be ordered. Pt stating he is starting to feel anxious and angry because he has not taken his meds. NP aware. Pt updated that we are still waiting for ACT to arrange housing.

## 2021-02-06 NOTE — ED Notes (Signed)
TTS called for update on ACT finding placement

## 2021-02-06 NOTE — ED Notes (Signed)
[facility] [place] they stated that they would not be able to transport or receive this pt tonight- this nurse received this number [phone] to be in contact with someone named [facility] who would be able to coordinate the arrival of this pt. [facility] stated that they would be available in the morning still to transport this pt.

## 2021-02-06 NOTE — ED Notes (Signed)
Pt given meal tray.

## 2021-02-06 NOTE — Consult Note (Addendum)
[facility] Face-to-Face Psychiatry Consult   Reason for Consult:Psychiatric Evaluation Referring Physician: Dr. Joni Fears  Patient Identification: Juan Hester MRN:  [id number] Principal Diagnosis: <principal problem not specified> Diagnosis:  Active Problems:   Crohn's disease with complication ([place])   Bipolar 1 disorder ([place])   History of colon cancer   Bipolar affective disorder, currently depressed, mild (HCC)   Nicotine dependence, cigarettes, uncomplicated   GAD (generalized anxiety disorder)   MDD (major depressive disorder)   PTSD (post-traumatic stress disorder)   Colostomy status ([place])   Homelessness   Overdose   Bipolar I disorder with depression, severe ([place])   Suicide attempt by drug overdose ([place])   Total Time spent with patient: 45 minutes  Subjective: "I am tired. Do I need to keep repeating the same things?" Juan Hester is a 40 y.o. male patient presented to [facility] ED voluntarily. It was reported that the patient is here to see a psychiatrist but cannot describe symptoms or concerns. The patient reports, "I keep saying I'm suicidal, and I keep getting turned away; since the 19th, I've been trying to communicate to this to see the doctors; I'm at my wit's end." The patient reports that he tried to commit suicide in January "I made me a cocktail, and I tried to die." The patient says he is homeless and has been since 01/30/21. "I'm almost six days with no sleep."  The patient was seen face-to-face by this provider; the chart was reviewed and consulted with Dr.Stafford on 02/06/2021 due to the patient's care. It was discussed with the EDP that the patient does not meet the criteria to be admitted to the psychiatric inpatient unit.  On evaluation, the patient is alert and oriented x4, angry, uncooperative, and mood-congruent with affect. The patient does not appear to be responding to internal or external stimuli. Neither is the patient presenting with any delusional thinking. The  patient denies auditory or visual hallucinations. The patient admits to suicidal ideation without a plan; he denies homicidal or self-harm ideations. The patient is not presenting with any psychotic or paranoid behaviors. During an encounter with the patient, he could answer questions appropriately.   Per TTS counselor Ms. Handy, The patient does currently have a ACT team with [facility] in [place].This Probation officer contacted patient's ACT team and spoke with Barnabas Lister who reports that they will be locating temporary housing for the patient tomorrow. ACT team contact is [phone]  HPI: Per Dr. Joni Fears, Juan Hester is a 40 y.o. male with a history of Crohn's disease and colostomy, bipolar disorder, homelessness who comes ED complaining of inability to sleep for the last 6 days and insufficient access to food.  Denies HI or SI.  Reports some vague minor auditory hallucinations which are chronic and at baseline for him.   Complains of chronic low back pain from herniated disc which has previously had MRI.   He was seen at [name] ED yesterday, seen by psychiatry who cleared him for discharge at that time.  He was assisted in obtaining his medications which he has with him.  He reports taking them as prescribed today.   He reports that he has come to the ED because he needs help getting some sleep, having some food that is suitable for his Crohn's disease, and assistance with obtaining housing.  He has an act team.  Past Psychiatric History:  PTSD (post-traumatic stress disorder) ETOH abuse Depression Anxiety  Risk to Self:   Risk to Others:   Prior Inpatient Therapy:  Prior Outpatient Therapy:    Past Medical History:  Past Medical History:  Diagnosis Date   Acute renal failure ([place]) 12/16/2019   Anxiety    per pt report   Colon cancer ([place])    Crohn disease ([place])    Depression    per pt report   ETOH abuse 10/16/2019   Foot drop, right    Multiple subsegmental  pulmonary emboli without acute cor pulmonale (HCC) 07/21/2019   Myocardial infarction (HCC)    PE (pulmonary thromboembolism) (HCC)    PTSD (post-traumatic stress disorder)    Thyroid disease     Past Surgical History:  Procedure Laterality Date   ABDOMINAL SURGERY     COLON SURGERY     IR RADIOLOGIST EVAL & MGMT  08/31/2019   OSTOMY     SMALL INTESTINE SURGERY     Family History: No family history on file. Family Psychiatric  History:  Social History:  Social History   Substance and Sexual Activity  Alcohol Use Yes   Comment: weekend     Social History   Substance and Sexual Activity  Drug Use Not Currently    Social History   Socioeconomic History   Marital status: Divorced    Spouse name: Not on file   Number of children: Not on file   Years of education: Not on file   Highest education level: Not on file  Occupational History   Not on file  Tobacco Use   Smoking status: Every Day    Packs/day: 1.00    Types: Cigarettes   Smokeless tobacco: Never  Vaping Use   Vaping Use: Never used  Substance and Sexual Activity   Alcohol use: Yes    Comment: weekend   Drug use: Not Currently   Sexual activity: Yes  Other Topics Concern   Not on file  Social History Narrative   Not on file   Social Determinants of Health   Financial Resource Strain: Not on file  Food Insecurity: Not on file  Transportation Needs: Not on file  Physical Activity: Not on file  Stress: Not on file  Social Connections: Not on file   Additional Social History:    Allergies:   Allergies  Allergen Reactions   Azathioprine Other (See Comments)    Pt does not recall reaction     Ciprofloxacin Other (See Comments)    Pt does not recall reaction     Mesalamine Other (See Comments)    Pt does not recall reaction   Metronidazole Other (See Comments)    Pt does not recall reaction    Other Other (See Comments)    All kind of vegetables & fruits, nuts - will not digest   Sulfa  Antibiotics Other (See Comments)    Pt does not recall reaction    Barium Sulfate Rash   Barium-Containing Compounds Rash    Labs:  Results for orders placed or performed during the hospital encounter of 02/05/21 (from the past 48 hour(s))  CBC     Status: Abnormal   Collection Time: 02/05/21  9:36 PM  Result Value Ref Range   WBC 3.5 (L) 4.0 - 10.5 K/uL   RBC 4.07 (L) 4.22 - 5.81 MIL/uL   Hemoglobin 12.8 (L) 13.0 - 17.0 g/dL   HCT 36.3 (L) 39.0 - 52.0 %   MCV 89.2 80.0 - 100.0 fL   MCH 31.4 26.0 - 34.0 pg   MCHC 35.3 30.0 - 36.0 g/dL   RDW 12.3 11.5 -  15.5 %   Platelets 136 (L) 150 - 400 K/uL   nRBC 0.0 0.0 - 0.2 %    Comment: Performed at [facility], [address] [place], [place] [address]  Comprehensive metabolic panel     Status: Abnormal   Collection Time: 02/05/21  9:36 PM  Result Value Ref Range   Sodium 136 135 - 145 mmol/L   Potassium 3.0 (L) 3.5 - 5.1 mmol/L   Chloride 98 98 - 111 mmol/L   CO2 29 22 - 32 mmol/L   Glucose, Bld 105 (H) 70 - 99 mg/dL    Comment: Glucose reference range applies only to samples taken after fasting for at least 8 hours.   BUN 20 6 - 20 mg/dL   Creatinine, Ser 1.47 (H) 0.61 - 1.24 mg/dL   Calcium 8.5 (L) 8.9 - 10.3 mg/dL   Total Protein 8.2 (H) 6.5 - 8.1 g/dL   Albumin 4.1 3.5 - 5.0 g/dL   AST 54 (H) 15 - 41 U/L   ALT 40 0 - 44 U/L   Alkaline Phosphatase 98 38 - 126 U/L   Total Bilirubin 0.9 0.3 - 1.2 mg/dL   GFR, Estimated >60 >60 mL/min    Comment: (NOTE) Calculated using the CKD-EPI Creatinine Equation (2021)    Anion gap 9 5 - 15    Comment: Performed at [facility], [address] [place], [place] [address]  Ethanol     Status: None   Collection Time: 02/05/21  9:36 PM  Result Value Ref Range   Alcohol, Ethyl (B) <10 <10 mg/dL    Comment: (NOTE) Lowest detectable limit for serum alcohol is 10 mg/dL.  For medical purposes only. Performed at [facility], [address]  [place], [place] [address]     Current Facility-Administered Medications  Medication Dose Route Frequency Provider Last Rate Last Admin   LORazepam (ATIVAN) injection 2 mg  2 mg Intramuscular Once Carrie Mew, MD       Current Outpatient Medications  Medication Sig Dispense Refill   Calamine-Zinc Oxide 8-8 % LOTN APPLY 1 APPLICATION TOPICALLY AS NEEDED FOR ITCHING. ON TOP OF RIGHT FOOT (Patient not taking: No sig reported) 120 mL 0   carisoprodol (SOMA) 250 MG tablet Take 250 mg by mouth daily as needed (muscle relaxing).     clonazePAM (KLONOPIN) 0.5 MG tablet TAKE 1/2 TABLET BY MOUTH TWO TIMES DAILY (Patient taking differently: Take 0.5 mg by mouth daily as needed for anxiety.) 60 tablet 2   cyclobenzaprine (FLEXERIL) 10 MG tablet TAKE 1 TABLET (10 MG TOTAL) BY MOUTH 2 (TWO) TIMES DAILY AS NEEDED FOR MUSCLE SPASMS. (Patient not taking: No sig reported) 30 tablet 0   cyclobenzaprine (FLEXERIL) 10 MG tablet TAKE 1 TABLET (10 MG TOTAL) BY MOUTH 2 (TWO) TIMES DAILY AS NEEDED FOR MUSCLE SPASMS. (Patient not taking: No sig reported) 30 tablet 0   diphenoxylate-atropine (LOMOTIL) 2.5-0.025 MG tablet Take 1 tablet by mouth 4 (four) times daily as needed for diarrhea or loose stools.     divalproex (DEPAKOTE) 250 MG DR tablet Take 3 tablets (750 mg total) by mouth at bedtime. (Patient taking differently: Take 250 mg by mouth every morning.) 90 tablet 0   divalproex (DEPAKOTE) 500 MG DR tablet Take 1 tablet (500 mg total) by mouth daily. (Patient not taking: Reported on 01/31/2021) 30 tablet 0   divalproex (DEPAKOTE) 500 MG DR tablet TAKE 1 TABLET (500 MG TOTAL) BY MOUTH 2 (TWO) TIMES DAILY. (Patient taking  differently: Take 500 mg by mouth at bedtime.) 120 tablet 2   divalproex (DEPAKOTE) 500 MG DR tablet Take 1 tablet (500 mg total) by mouth at bedtime. 30 tablet 0   DULoxetine (CYMBALTA) 60 MG capsule Take 1 capsule (60 mg total) by mouth daily. (Patient taking differently: Take 60 mg by mouth 2  (two) times daily.) 60 capsule 2   DULoxetine (CYMBALTA) 60 MG capsule TAKE 1 CAPSULE (60 MG TOTAL) BY MOUTH DAILY. (Patient not taking: No sig reported) 60 capsule 2   DULoxetine (CYMBALTA) 60 MG capsule Take 1 capsule (60 mg total) by mouth 2 (two) times daily. 60 capsule 0   gabapentin (NEURONTIN) 300 MG capsule Take 2 capsules (600 mg total) by mouth 3 (three) times daily. (Patient not taking: No sig reported)     gabapentin (NEURONTIN) 300 MG capsule TAKE 1 CAPSULE (300 MG TOTAL) BY MOUTH 3 (THREE) TIMES DAILY. (Patient not taking: Reported on 01/31/2021) 90 capsule 2   hydrOXYzine (ATARAX/VISTARIL) 10 MG tablet TAKE 1 TABLET (10 MG TOTAL) BY MOUTH 3 (THREE) TIMES DAILY AS NEEDED. (Patient not taking: No sig reported) 60 tablet 0   hydrOXYzine (ATARAX/VISTARIL) 10 MG tablet TAKE 1 TABLET (10 MG TOTAL) BY MOUTH 3 (THREE) TIMES DAILY AS NEEDED. (Patient not taking: No sig reported) 30 tablet 0   methocarbamol (ROBAXIN) 500 MG tablet Take 1 tablet (500 mg total) by mouth 2 (two) times daily. 20 tablet 0   naloxone (NARCAN) nasal spray 4 mg/0.1 mL PLACE 1 SPRAY (4 MG TOTAL) INTO THE NOSE AS NEEDED FOR OVERDOSE SYMPTOMS FROM OPIATE. SEE ADMINISTRATION INSTRUCTIONS (Patient taking differently: Place 1 spray into the nose once as needed (overdose).) 2 each 1   potassium chloride (KLOR-CON) 10 MEQ tablet Take 1 tablet (10 mEq total) by mouth 2 (two) times daily. (Patient not taking: No sig reported) 6 tablet 0   potassium chloride (KLOR-CON) 10 MEQ tablet TAKE 1 TABLET (10 MEQ TOTAL) BY MOUTH 2 (TWO) TIMES DAILY. (Patient not taking: No sig reported) 6 tablet 0   predniSONE (DELTASONE) 10 MG tablet Take 6 tablets by mouth on day 1, 5 tablets on day 2, 4 tablets on day 3, 3 tabets on day 4, 2 tablets on day 5, take 1 tablet on day 6 21 tablet 0   predniSONE (STERAPRED UNI-PAK 21 TAB) 10 MG (21) TBPK tablet Take by mouth daily. Take 6 tabs by mouth daily  for 2 days, then 5 tabs for 2 days, then 4 tabs for 2  days, then 3 tabs for 2 days, 2 tabs for 2 days, then 1 tab by mouth daily for 2 days 42 tablet 0   QUEtiapine (SEROQUEL) 300 MG tablet Take 1 tablet (300 mg total) by mouth at bedtime. 30 tablet 0   QUEtiapine (SEROQUEL) 300 MG tablet TAKE 1 TABLET (300 MG TOTAL) BY MOUTH AT BEDTIME. 30 tablet 2   QUEtiapine (SEROQUEL) 300 MG tablet Take 1 tablet (300 mg total) by mouth at bedtime. 30 tablet 0   QUEtiapine (SEROQUEL) 400 MG tablet TAKE 1 TABLET (400 MG TOTAL) BY MOUTH AT BEDTIME. (Patient taking differently: Take 400 mg by mouth at bedtime.) 60 tablet 2   QUEtiapine (SEROQUEL) 50 MG tablet Take 1 tablet (50 mg total) by mouth daily at 6 (six) AM. 30 tablet 0   QUEtiapine (SEROQUEL) 50 MG tablet TAKE 1 TABLET (50 MG TOTAL) BY MOUTH DAILY. (Patient taking differently: Take 50 mg by mouth every morning.) 60 tablet 2  QUEtiapine (SEROQUEL) 50 MG tablet TAKE 1 TABLET (50 MG TOTAL) BY MOUTH 2 (TWO) TIMES DAILY. 60 tablet 2   QUEtiapine (SEROQUEL) 50 MG tablet Take 1 tablet (50 mg total) by mouth daily. 30 tablet 0    Musculoskeletal: Strength & Muscle Tone: decreased Gait & Station: unsteady Patient leans: Backward            Psychiatric Specialty Exam:  Presentation  General Appearance: Disheveled  Eye Contact:Fair  Speech:Clear and Coherent  Speech Volume:Normal  Handedness:Right   Mood and Affect  Mood:Depressed; Angry  Affect:Blunt; Flat   Thought Process  Thought Processes:Coherent  Descriptions of Associations:Intact  Orientation:Full (Time, Place and Person)  Thought Content:Logical  History of Schizophrenia/Schizoaffective disorder:No  Duration of Psychotic Symptoms:No data recorded Hallucinations:Hallucinations: None  Ideas of Reference:None  Suicidal Thoughts:Suicidal Thoughts: Yes, Passive SI Passive Intent and/or Plan: Without Intent; Without Plan  Homicidal Thoughts:Homicidal Thoughts: No   Sensorium  Memory:Immediate Good; Recent Good;  Remote Good  Judgment:Fair  Insight:Fair   Executive Functions  Concentration:Fair  Attention Span:Fair  [place]   Psychomotor Activity  Psychomotor Activity:Psychomotor Activity: Normal   Assets  Assets:Communication Skills; Desire for Improvement; Resilience; Social Support   Sleep  Sleep:Sleep: Poor Number of Hours of Sleep: -1   Physical Exam: Physical Exam Vitals and nursing note reviewed.  Constitutional:      Appearance: Normal appearance. He is normal weight.  HENT:     Head: Normocephalic and atraumatic.     Right Ear: External ear normal.     Left Ear: External ear normal.     Nose: Nose normal.     Mouth/Throat:     Mouth: Mucous membranes are moist.  Cardiovascular:     Rate and Rhythm: Normal rate.     Pulses: Normal pulses.  Pulmonary:     Effort: Pulmonary effort is normal.  Musculoskeletal:        General: Normal range of motion.     Cervical back: Normal range of motion and neck supple.  Neurological:     Mental Status: He is alert and oriented to person, place, and time. Mental status is at baseline.     Gait: Gait abnormal.  Psychiatric:        Attention and Perception: Attention and perception normal.        Mood and Affect: Mood is anxious and depressed. Affect is angry and inappropriate.        Speech: Speech is tangential.        Behavior: Behavior is uncooperative, agitated and withdrawn.        Thought Content: Thought content includes suicidal ideation.        Cognition and Memory: Cognition and memory normal.        Judgment: Judgment is impulsive and inappropriate.   ROS Blood pressure 138/76, pulse 96, temperature 98.4 F (36.9 C), temperature source Oral, resp. rate 20, height 6' 1"  (1.854 m), weight 99 kg, SpO2 100 %. Body mass index is 28.8 kg/m.  Treatment Plan Summary: Plan Patient does not meet criteria for psychiatric inpatient admission  Disposition: No evidence of  imminent risk to self or others at present.   Patient does not meet criteria for psychiatric inpatient admission. Supportive therapy provided about ongoing stressors. Refer to IOP. Discussed crisis plan, support from social network, calling 911, coming to the Emergency Department, and calling Suicide Hotline.  Caroline Sauger, NP 02/06/2021 1:25 AM

## 2021-02-06 NOTE — BH Assessment (Signed)
TTS received call from Stephanie([facility]) [phone] stating they arranged for patient to temporarily stay at local shelter in [facility]). [place] states patient is currently not authorized for services at this time due to recent incarceration and patient is now out of their catchment area. Barnabas Lister from [facility] Team will assist in linking patient with [facility] in [place].

## 2021-02-06 NOTE — ED Notes (Signed)
Safe transport contacted

## 2021-02-06 NOTE — ED Notes (Signed)
During round---pt was using the restroom

## 2021-02-06 NOTE — ED Notes (Signed)
Safe Transport called to transport pt to [facility]

## 2021-02-06 NOTE — BH Assessment (Signed)
Care Management - Follow Up [facility] Discharges   Writer attempted to make contact with patient today and was unsuccessful.  Writer left a HIPPA compliant voice message.   Per chart review, patient will f/u with his ACT Team [facility]. Pt has an appt with [facility] on 11/2 @2 :30.

## 2021-02-07 ENCOUNTER — Other Ambulatory Visit: Payer: Self-pay

## 2021-02-07 MED ORDER — QUETIAPINE FUMARATE 400 MG PO TABS
400.0000 mg | ORAL_TABLET | Freq: Every day | ORAL | 0 refills | Status: DC
  Filled 2021-02-07: qty 30, 30d supply, fill #0

## 2021-02-07 MED ORDER — DIVALPROEX SODIUM 250 MG PO DR TAB
250.0000 mg | DELAYED_RELEASE_TABLET | Freq: Every morning | ORAL | 0 refills | Status: DC
  Filled 2021-02-07: qty 30, 30d supply, fill #0

## 2021-02-07 MED ORDER — QUETIAPINE FUMARATE 50 MG PO TABS
50.0000 mg | ORAL_TABLET | Freq: Every morning | ORAL | 0 refills | Status: DC
  Filled 2021-02-07: qty 30, 30d supply, fill #0

## 2021-02-07 MED ORDER — DIVALPROEX SODIUM 500 MG PO DR TAB
500.0000 mg | DELAYED_RELEASE_TABLET | Freq: Every day | ORAL | 0 refills | Status: DC
  Filled 2021-02-07: qty 30, 30d supply, fill #0

## 2021-02-07 MED ORDER — DULOXETINE HCL 30 MG PO CPEP
60.0000 mg | ORAL_CAPSULE | Freq: Two times a day (BID) | ORAL | 0 refills | Status: DC
  Filled 2021-02-07: qty 120, 30d supply, fill #0

## 2021-02-07 NOTE — ED Notes (Addendum)
Social worker recommended giving patient the resource packet. Pt informed he tested positive 10/23 for COVID. Encouraged patient to call brother.

## 2021-02-07 NOTE — ED Provider Notes (Signed)
Procedures     ----------------------------------------- 10:51 AM on 02/07/2021 -----------------------------------------   Patient mains medically and psychiatrically stable in the ED.  Social work has attempted to coordinate resources for the patient.  Refills of his medication sent to medication management clinic.  Written resources provided.  Stable for discharge   Carrie Mew, MD 02/07/21 1051

## 2021-02-07 NOTE — ED Notes (Signed)
DeeDee from [facility] called RN back and informed that there is no space for the patient.

## 2021-02-07 NOTE — ED Provider Notes (Signed)
-----------------------------------------   8:39 AM on 02/07/2021 -----------------------------------------   Blood pressure 99/65, pulse 89, temperature 98.4 F (36.9 C), temperature source Oral, resp. rate 15, height 1.854 m (6' 1" ), weight 99 kg, SpO2 92 %.  The patient is calm and cooperative at this time.  There have been no acute events since the last update.  Plan is for transportation to a shelter at some point this morning.  He has been medically and psychiatrically cleared.   Hinda Kehr, MD 02/07/21 [phone]

## 2021-02-07 NOTE — ED Notes (Addendum)
Verified correct patient and correct discharge papers given. Pt alert and oriented X 4, stable for discharge. RR even and unlabored, color WNL. Discussed discharge instructions and follow-up as directed. Discharge medications discussed, when prescribed. Pt had opportunity to ask questions, and RN available to provide patient and/or family education. Pt given resource packet and encouraged to call. Informed about phone in lobby. Pt given all of belongings,. 2 bags and cane prior to discharge.

## 2021-02-07 NOTE — ED Notes (Signed)
[facility] [place] to facilitate safe pt discharge. No answer, left message to return phone call.

## 2021-02-07 NOTE — ED Notes (Signed)
VOL/ Pending disposition 

## 2021-02-07 NOTE — ED Notes (Signed)
Pt refusing to call brother. Given packet of resources and phone to call.

## 2021-02-07 NOTE — ED Notes (Signed)
Pt given breakfast tray

## 2021-02-07 NOTE — ED Notes (Signed)
Pt ate all of breakfast tray.

## 2021-02-08 ENCOUNTER — Other Ambulatory Visit: Payer: Self-pay

## 2021-02-10 ENCOUNTER — Other Ambulatory Visit: Payer: Self-pay | Admitting: Critical Care Medicine

## 2021-02-10 NOTE — Telephone Encounter (Signed)
Requested medication (s) are due for refill today: yes  Requested medication (s) are on the active medication list: yes  Last refill: 11/28/19  #60  2 refills  Future visit scheduled  yes 02/15/20  Notes to clinic: not delegated  Requested Prescriptions  Pending Prescriptions Disp Refills   clonazePAM (KLONOPIN) 0.5 MG tablet 60 tablet 2    Sig: TAKE 1/2 TABLET BY MOUTH TWO TIMES DAILY     Not Delegated - Psychiatry:  Anxiolytics/Hypnotics Failed - 02/10/2021  5:39 AM      Failed - This refill cannot be delegated      Failed - Valid encounter within last 6 months    Recent Outpatient Visits           9 months ago Chronic pain syndrome   [place] [name], MD   1 year ago Lumbar radiculopathy   [place], Patrick E, MD   1 year ago Acute renal failure, unspecified acute renal failure type [facility])   [place] [name], MD   1 year ago Lumbar radiculopathy   [place] [name], MD   1 year ago Lumbar radiculopathy   [facility], [place]       Future Appointments             In 4 days Thereasa Solo Casimer Bilis [place] completed in last 360 days

## 2021-02-12 ENCOUNTER — Other Ambulatory Visit: Payer: Self-pay

## 2021-02-14 ENCOUNTER — Inpatient Hospital Stay: Payer: Self-pay | Admitting: Physician Assistant

## 2021-02-17 ENCOUNTER — Other Ambulatory Visit: Payer: Self-pay

## 2021-02-17 ENCOUNTER — Emergency Department (HOSPITAL_COMMUNITY)
Admission: EM | Admit: 2021-02-17 | Discharge: 2021-02-17 | Disposition: A | Discharge status: Home / Self Care | Payer: Self-pay | Attending: Emergency Medicine | Admitting: Emergency Medicine

## 2021-02-17 ENCOUNTER — Encounter (HOSPITAL_COMMUNITY): Payer: Self-pay | Admitting: Emergency Medicine

## 2021-02-17 DIAGNOSIS — Z79899 Other long term (current) drug therapy: Secondary | ICD-10-CM | POA: Insufficient documentation

## 2021-02-17 DIAGNOSIS — K9400 Colostomy complication, unspecified: Secondary | ICD-10-CM | POA: Insufficient documentation

## 2021-02-17 DIAGNOSIS — Z85038 Personal history of other malignant neoplasm of large intestine: Secondary | ICD-10-CM | POA: Insufficient documentation

## 2021-02-17 DIAGNOSIS — F17210 Nicotine dependence, cigarettes, uncomplicated: Secondary | ICD-10-CM | POA: Insufficient documentation

## 2021-02-17 NOTE — ED Triage Notes (Signed)
Patient states his colostomy bag fell off, no trauma, some pain to site due to leaking.

## 2021-02-17 NOTE — ED Provider Notes (Signed)
[employer] [facility] DEPT Provider Note   CSN: [id number] Arrival date & time: 02/17/21  2542     History Chief Complaint  Patient presents with   Colostomy Bag Replacement    Juan Hester is a 40 y.o. male.  HPI  Patient presents ED with complaints of leaking from his colostomy bag.  Patient denies any specific complaints.  He did not have any trauma.  He is not having any vomiting or diarrhea.  No abdominal pain.  No fevers or chills.  Past Medical History:  Diagnosis Date   Acute renal failure ([place]) 12/16/2019   Anxiety    per pt report   Colon cancer ([place])    Crohn disease ([place])    Depression    per pt report   ETOH abuse 10/16/2019   Foot drop, right    Multiple subsegmental pulmonary emboli without acute cor pulmonale ([place]) 07/21/2019   Myocardial infarction [facility])    PE (pulmonary thromboembolism) (HCC)    PTSD (post-traumatic stress disorder)    Thyroid disease     Patient Active Problem List   Diagnosis Date Noted   Suicide attempt by drug overdose ([place]) 05/12/2020   Bipolar I disorder with depression, severe ([place]) 05/10/2020   Overdose 05/03/2020   Chronic pain syndrome 01/31/2020   Homelessness 12/27/2019   Colostomy status ([place]) 12/16/2019   History of pulmonary embolism 12/16/2019   Right foot drop 12/15/2019   GAD (generalized anxiety disorder) 10/20/2019   MDD (major depressive disorder) 10/20/2019   PTSD (post-traumatic stress disorder) 10/20/2019   Bipolar affective disorder, currently depressed, mild ([place]) 10/16/2019   Protrusion of lumbar intervertebral disc 10/05/2019   Bipolar 1 disorder ([place]) 07/21/2019   History of colon cancer 07/21/2019   Schizoaffective disorder, bipolar type ([place]) 06/03/2019   Nicotine dependence, cigarettes, uncomplicated 70/62/3762   Crohn's disease with complication ([place]) 83/15/1761    Past Surgical History:  Procedure Laterality Date   ABDOMINAL SURGERY     COLON SURGERY     IR RADIOLOGIST  EVAL & MGMT  08/31/2019   OSTOMY     SMALL INTESTINE SURGERY         No family history on file.  Social History   Tobacco Use   Smoking status: Every Day    Packs/day: 1.00    Types: Cigarettes   Smokeless tobacco: Never  Vaping Use   Vaping Use: Never used  Substance Use Topics   Alcohol use: Yes    Comment: weekend   Drug use: Not Currently    Home Medications Prior to Admission medications   Medication Sig Start Date End Date Taking? Authorizing Provider  carisoprodol (SOMA) 250 MG tablet Take 250 mg by mouth daily as needed (muscle relaxing).    [provider]  clonazePAM (KLONOPIN) 0.5 MG tablet TAKE 1/2 TABLET BY MOUTH TWO TIMES DAILY Patient taking differently: No sig reported 12/29/19 01/31/21  [name], MD  diphenoxylate-atropine (LOMOTIL) 2.5-0.025 MG tablet Take 1 tablet by mouth 4 (four) times daily as needed for diarrhea or loose stools.    [provider]  divalproex (DEPAKOTE) 250 MG DR tablet Take 1 tablet (250 mg total) by mouth once every morning. Patient taking differently: Take 250 mg by mouth 3 (three) times daily. 02/07/21 03/09/21  Carrie Mew, MD  divalproex (DEPAKOTE) 500 MG DR tablet Take 1 tablet (500 mg total) by mouth at bedtime. 02/04/21   Coralee Pesa, MD  divalproex (DEPAKOTE) 500 MG DR tablet Take 1 tablet (  500 mg total) by mouth once daily at bedtime. 02/07/21 02/07/22  Carrie Mew, MD  DULoxetine (CYMBALTA) 60 MG capsule Take 1 capsule (60 mg total) by mouth daily. 04/19/20   Salley Slaughter, NP  DULoxetine (CYMBALTA) 30 MG capsule Take 2 capsules (60 mg total) by mouth 2 (two) times daily. Patient not taking: Reported on 02/17/2021 02/07/21 03/09/21  Carrie Mew, MD  gabapentin (NEURONTIN) 300 MG capsule Take 2 capsules (600 mg total) by mouth 3 (three) times daily. Patient not taking: No sig reported 05/10/20   Amin, Jeanella Flattery, MD  hydrOXYzine (ATARAX/VISTARIL) 10 MG tablet TAKE 1 TABLET  (10 MG TOTAL) BY MOUTH 3 (THREE) TIMES DAILY AS NEEDED. Patient not taking: No sig reported 04/24/20 04/24/21  [name], MD  methocarbamol (ROBAXIN) 500 MG tablet Take 1 tablet (500 mg total) by mouth 2 (two) times daily. 02/03/21   Alroy Bailiff, Margaux, PA-C  naloxone [facility]) nasal spray 4 mg/0.1 mL PLACE 1 SPRAY (4 MG TOTAL) INTO THE NOSE AS NEEDED FOR OVERDOSE SYMPTOMS FROM OPIATE. SEE ADMINISTRATION INSTRUCTIONS Patient taking differently: Place 1 spray into the nose once as needed (overdose). 03/15/20 03/15/21  [name], MD  potassium chloride (KLOR-CON) 10 MEQ tablet Take 1 tablet (10 mEq total) by mouth 2 (two) times daily. Patient not taking: No sig reported 06/09/20   Montine Circle, PA-C  predniSONE (DELTASONE) 10 MG tablet Take 6 tablets by mouth on day 1, 5 tablets on day 2, 4 tablets on day 3, 3 tabets on day 4, 2 tablets on day 5, take 1 tablet on day 6 02/03/21   Alroy Bailiff, Margaux, PA-C  QUEtiapine (SEROQUEL) 400 MG tablet Take 1 tablet (400 mg total) by mouth once daily at bedtime. 02/07/21 02/07/22  Carrie Mew, MD  QUEtiapine (SEROQUEL) 50 MG tablet Take 1 tablet (50 mg total) by mouth once every morning. Patient taking differently: Take 50 mg by mouth at bedtime. 02/07/21 03/09/21  Carrie Mew, MD    Allergies    Azathioprine, Ciprofloxacin, Mesalamine, Metronidazole, Other, Sulfa antibiotics, Barium sulfate, and Barium-containing compounds  Review of Systems   Review of Systems  All other systems reviewed and are negative.  Physical Exam Updated Vital Signs BP 103/75   Pulse 82   Temp 97.7 F (36.5 C) (Oral)   Resp 18   SpO2 100%   Physical Exam Vitals and nursing note reviewed.  Constitutional:      General: He is not in acute distress.    Appearance: He is well-developed.  HENT:     Head: Normocephalic and atraumatic.     Right Ear: External ear normal.     Left Ear: External ear normal.  Eyes:     General: No scleral icterus.        Right eye: No discharge.        Left eye: No discharge.     Conjunctiva/sclera: Conjunctivae normal.  Neck:     Trachea: No tracheal deviation.  Cardiovascular:     Rate and Rhythm: Normal rate.  Pulmonary:     Effort: Pulmonary effort is normal. No respiratory distress.     Breath sounds: No stridor.  Abdominal:     General: There is no distension.     Tenderness: There is no abdominal tenderness.     Comments: Colostomy bag in place, there is some leaking of stool contents due to the seal being broken around the skin, no surrounding erythema  Musculoskeletal:        General:  No swelling or deformity.     Cervical back: Neck supple.  Skin:    General: Skin is warm and dry.     Findings: No rash.  Neurological:     Mental Status: He is alert.     Cranial Nerves: Cranial nerve deficit: no gross deficits.    ED Results / Procedures / Treatments   Labs (all labs ordered are listed, but only abnormal results are displayed) Labs Reviewed - No data to display  EKG None  Radiology No results found.  Procedures Procedures   Medications Ordered in ED Medications - No data to display  ED Course  I have reviewed the triage vital signs and the nursing notes.  Pertinent labs & imaging results that were available during my care of the patient were reviewed by me and considered in my medical decision making (see chart for details).    MDM Rules/Calculators/A&P                           Patient presents with complaints of his colostomy bag leaking.  There is no evidence of herniation.  No evidence of infection.  Will have a new colostomy bag placed Final Clinical Impression(s) / ED Diagnoses Final diagnoses:  Colostomy complication [facility])    Rx / DC Orders ED Discharge Orders     None        Dorie Rank, MD 02/17/21 [phone]

## 2021-02-20 ENCOUNTER — Emergency Department (HOSPITAL_COMMUNITY)
Admission: EM | Admit: 2021-02-20 | Discharge: 2021-02-20 | Disposition: A | Discharge status: Home / Self Care | Payer: Self-pay | Attending: Emergency Medicine | Admitting: Emergency Medicine

## 2021-02-20 ENCOUNTER — Other Ambulatory Visit: Payer: Self-pay

## 2021-02-20 ENCOUNTER — Encounter (HOSPITAL_COMMUNITY): Payer: Self-pay

## 2021-02-20 DIAGNOSIS — Z433 Encounter for attention to colostomy: Secondary | ICD-10-CM | POA: Insufficient documentation

## 2021-02-20 NOTE — Discharge Instructions (Addendum)
You were provided with colostomy bags on todays visit.   Keep your skin clean and dry.

## 2021-02-20 NOTE — ED Provider Notes (Signed)
[place] DEPT Provider Note   CSN: [id number] Arrival date & time: 02/20/21  2002     History Chief Complaint  Patient presents with   colostomy bag problem     Juan Hester is a 40 y.o. male.  40 y.o male with a PMH of ARF, Crohns, color cancer s/p colostomy placement presents to the ED via EMS with a chief complaint of colostomy bag not sticking. Patient was evaluate in the ED 3 days ago for the same complaint, also given supplies for his colostomy bag. On todays visit, his bag is duct taped onto the skin.   The history is provided by the patient and medical records.      Past Medical History:  Diagnosis Date   Acute renal failure ([place]) 12/16/2019   Anxiety    per pt report   Colon cancer ([place])    Crohn disease ([place])    Depression    per pt report   ETOH abuse 10/16/2019   Foot drop, right    Multiple subsegmental pulmonary emboli without acute cor pulmonale ([place]) 07/21/2019   Myocardial infarction [facility])    PE (pulmonary thromboembolism) (HCC)    PTSD (post-traumatic stress disorder)    Thyroid disease     Patient Active Problem List   Diagnosis Date Noted   Suicide attempt by drug overdose ([place]) 05/12/2020   Bipolar I disorder with depression, severe ([place]) 05/10/2020   Overdose 05/03/2020   Chronic pain syndrome 01/31/2020   Homelessness 12/27/2019   Colostomy status ([place]) 12/16/2019   History of pulmonary embolism 12/16/2019   Right foot drop 12/15/2019   GAD (generalized anxiety disorder) 10/20/2019   MDD (major depressive disorder) 10/20/2019   PTSD (post-traumatic stress disorder) 10/20/2019   Bipolar affective disorder, currently depressed, mild ([place]) 10/16/2019   Protrusion of lumbar intervertebral disc 10/05/2019   Bipolar 1 disorder ([place]) 07/21/2019   History of colon cancer 07/21/2019   Schizoaffective disorder, bipolar type ([place]) 06/03/2019   Nicotine dependence, cigarettes, uncomplicated 66/09/3014   Crohn's disease  with complication ([place]) 04/23/3233    Past Surgical History:  Procedure Laterality Date   ABDOMINAL SURGERY     COLON SURGERY     IR RADIOLOGIST EVAL & MGMT  08/31/2019   OSTOMY     SMALL INTESTINE SURGERY         History reviewed. No pertinent family history.  Social History   Tobacco Use   Smoking status: Every Day    Packs/day: 1.00    Types: Cigarettes   Smokeless tobacco: Never  Vaping Use   Vaping Use: Never used  Substance Use Topics   Alcohol use: Yes    Comment: weekend   Drug use: Not Currently    Home Medications Prior to Admission medications   Medication Sig Start Date End Date Taking? Authorizing Provider  carisoprodol (SOMA) 250 MG tablet Take 250 mg by mouth daily as needed (muscle relaxing).    [provider]  clonazePAM (KLONOPIN) 0.5 MG tablet TAKE 1/2 TABLET BY MOUTH TWO TIMES DAILY Patient taking differently: No sig reported 12/29/19 01/31/21  [name], MD  diphenoxylate-atropine (LOMOTIL) 2.5-0.025 MG tablet Take 1 tablet by mouth 4 (four) times daily as needed for diarrhea or loose stools.    [provider]  divalproex (DEPAKOTE) 250 MG DR tablet Take 1 tablet (250 mg total) by mouth once every morning. Patient taking differently: Take 250 mg by mouth 3 (three) times daily. 02/07/21 03/09/21  Carrie Mew, MD  divalproex (DEPAKOTE) 500 MG DR tablet Take 1 tablet (500 mg total) by mouth at bedtime. 02/04/21   Coralee Pesa, MD  divalproex (DEPAKOTE) 500 MG DR tablet Take 1 tablet (500 mg total) by mouth once daily at bedtime. 02/07/21 02/07/22  Carrie Mew, MD  DULoxetine (CYMBALTA) 60 MG capsule Take 1 capsule (60 mg total) by mouth daily. 04/19/20   Salley Slaughter, NP  DULoxetine (CYMBALTA) 30 MG capsule Take 2 capsules (60 mg total) by mouth 2 (two) times daily. Patient not taking: Reported on 02/17/2021 02/07/21 03/09/21  Carrie Mew, MD  gabapentin (NEURONTIN) 300 MG capsule Take 2 capsules (600  mg total) by mouth 3 (three) times daily. Patient not taking: No sig reported 05/10/20   Amin, Jeanella Flattery, MD  hydrOXYzine (ATARAX/VISTARIL) 10 MG tablet TAKE 1 TABLET (10 MG TOTAL) BY MOUTH 3 (THREE) TIMES DAILY AS NEEDED. Patient not taking: No sig reported 04/24/20 04/24/21  [name], MD  methocarbamol (ROBAXIN) 500 MG tablet Take 1 tablet (500 mg total) by mouth 2 (two) times daily. 02/03/21   Alroy Bailiff, Margaux, PA-C  naloxone [facility]) nasal spray 4 mg/0.1 mL PLACE 1 SPRAY (4 MG TOTAL) INTO THE NOSE AS NEEDED FOR OVERDOSE SYMPTOMS FROM OPIATE. SEE ADMINISTRATION INSTRUCTIONS Patient taking differently: Place 1 spray into the nose once as needed (overdose). 03/15/20 03/15/21  [name], MD  potassium chloride (KLOR-CON) 10 MEQ tablet Take 1 tablet (10 mEq total) by mouth 2 (two) times daily. Patient not taking: No sig reported 06/09/20   Montine Circle, PA-C  predniSONE (DELTASONE) 10 MG tablet Take 6 tablets by mouth on day 1, 5 tablets on day 2, 4 tablets on day 3, 3 tabets on day 4, 2 tablets on day 5, take 1 tablet on day 6 02/03/21   Alroy Bailiff, Margaux, PA-C  QUEtiapine (SEROQUEL) 400 MG tablet Take 1 tablet (400 mg total) by mouth once daily at bedtime. 02/07/21 02/07/22  Carrie Mew, MD  QUEtiapine (SEROQUEL) 50 MG tablet Take 1 tablet (50 mg total) by mouth once every morning. Patient taking differently: Take 50 mg by mouth at bedtime. 02/07/21 03/09/21  Carrie Mew, MD    Allergies    Azathioprine, Ciprofloxacin, Mesalamine, Metronidazole, Other, Sulfa antibiotics, Barium sulfate, and Barium-containing compounds  Review of Systems   Review of Systems  Constitutional:  Negative for fever.  Gastrointestinal:  Negative for abdominal pain.   Physical Exam Updated Vital Signs BP (!) 135/97   Pulse (!) 112   Temp 98.2 F (36.8 C) (Oral)   Resp 18   Ht 6' 1"  (1.854 m)   Wt 99.8 kg   SpO2 99%   BMI 29.03 kg/m   Physical Exam Vitals and nursing note  reviewed.  Constitutional:      Comments: Shirtless ambulatory in the ED   HENT:     Head: Normocephalic and atraumatic.     Mouth/Throat:     Mouth: Mucous membranes are moist.  Cardiovascular:     Rate and Rhythm: Normal rate.  Pulmonary:     Effort: Pulmonary effort is normal.  Abdominal:     General: Abdomen is flat.     Comments: Colostomy bag in place sealed with duct tape.   Musculoskeletal:     Cervical back: Normal range of motion and neck supple.  Skin:    General: Skin is warm and dry.  Neurological:     Mental Status: He is alert and oriented to person, place, and time.  ED Results / Procedures / Treatments   Labs (all labs ordered are listed, but only abnormal results are displayed) Labs Reviewed - No data to display  EKG None  Radiology No results found.  Procedures Procedures   Medications Ordered in ED Medications - No data to display  ED Course  I have reviewed the triage vital signs and the nursing notes.  Pertinent labs & imaging results that were available during my care of the patient were reviewed by me and considered in my medical decision making (see chart for details).    MDM Rules/Calculators/A&P   Patient with a PMH of colon CA requesting supplies in order to help keep his colostomy in place. Vitals are within normal limits.   Supplies provided by nursing staff. Patient is stable for discharge.     Portions of this note were generated with [employer]. Dictation errors may occur despite best attempts at proofreading.  Final Clinical Impression(s) / ED Diagnoses Final diagnoses:  Colostomy care [facility])    Rx / [place] Orders ED Discharge Orders     None        Corinna Capra 02/20/21 2033    Drenda Freeze, MD 02/20/21 2248

## 2021-02-20 NOTE — ED Triage Notes (Signed)
Pt BIB EMS states that his colostomy bag is not sticking.

## 2021-02-21 ENCOUNTER — Emergency Department (HOSPITAL_COMMUNITY)
Admission: EM | Admit: 2021-02-21 | Discharge: 2021-02-21 | Disposition: A | Discharge status: Home / Self Care | Payer: Self-pay | Attending: Emergency Medicine | Admitting: Emergency Medicine

## 2021-02-21 ENCOUNTER — Encounter (HOSPITAL_COMMUNITY): Payer: Self-pay | Admitting: Emergency Medicine

## 2021-02-21 DIAGNOSIS — Z5321 Procedure and treatment not carried out due to patient leaving prior to being seen by health care provider: Secondary | ICD-10-CM | POA: Insufficient documentation

## 2021-02-21 DIAGNOSIS — K9400 Colostomy complication, unspecified: Secondary | ICD-10-CM | POA: Insufficient documentation

## 2021-02-21 NOTE — ED Triage Notes (Signed)
Per EMS-states he was here last night for the same-states who ever changed bag did not include a "part" so bag is leaking

## 2021-02-22 ENCOUNTER — Encounter (HOSPITAL_COMMUNITY): Payer: Self-pay | Admitting: Emergency Medicine

## 2021-02-22 ENCOUNTER — Emergency Department (HOSPITAL_COMMUNITY)
Admission: EM | Admit: 2021-02-22 | Discharge: 2021-02-23 | Disposition: A | Discharge status: Home / Self Care | Payer: Self-pay | Attending: Emergency Medicine | Admitting: Emergency Medicine

## 2021-02-22 ENCOUNTER — Other Ambulatory Visit: Payer: Self-pay

## 2021-02-22 DIAGNOSIS — Z433 Encounter for attention to colostomy: Secondary | ICD-10-CM | POA: Insufficient documentation

## 2021-02-22 DIAGNOSIS — Z85038 Personal history of other malignant neoplasm of large intestine: Secondary | ICD-10-CM | POA: Insufficient documentation

## 2021-02-22 DIAGNOSIS — F17210 Nicotine dependence, cigarettes, uncomplicated: Secondary | ICD-10-CM | POA: Insufficient documentation

## 2021-02-22 NOTE — ED Triage Notes (Signed)
Patient states he can not afford his ostomy bags and that he will continue to come to the ED to get them. Patient is rude.

## 2021-02-22 NOTE — ED Triage Notes (Signed)
Pt BIB EMS, pt needs a colostomy bag.

## 2021-02-22 NOTE — ED Notes (Signed)
Pt has a straight blade knife and is cutting paint off of his jeans. Security notified, pt refuses to give up knife, states that he will just wait in the lobby for his supplies.

## 2021-02-23 NOTE — Discharge Instructions (Signed)
Your colostomy bag was changed today.

## 2021-02-23 NOTE — ED Notes (Signed)
[place] is where the patient said he lives. Behind the [employer].

## 2021-02-23 NOTE — ED Provider Notes (Signed)
[place] DEPT Provider Note   CSN: [id number] Arrival date & time: 02/22/21  2241     History Chief Complaint  Patient presents with   Needs Colostomy Bag     Juan Hester is a 40 y.o. male.  HPI 40 y.o male with a PMH of ARF, Crohns, color cancer s/p colostomy placement presents to the ED via EMS with a chief complaint of colostomy bag not sticking  Patient has long history of using ER for colostomy supplies.  He was last seen in the ER 2 days ago.  He denies any pain denies any fevers chills states that he is having normal output.  Denies any symptoms today.    Past Medical History:  Diagnosis Date   Acute renal failure ([place]) 12/16/2019   Anxiety    per pt report   Colon cancer ([place])    Crohn disease ([place])    Depression    per pt report   ETOH abuse 10/16/2019   Foot drop, right    Multiple subsegmental pulmonary emboli without acute cor pulmonale ([place]) 07/21/2019   My[facility] infarction [facility])    PE (pulmonary thromboembolism) (HCC)    PTSD (post-traumatic stress disorder)    Thyroid disease     Patient Active Problem List   Diagnosis Date Noted   Suicide attempt by drug overdose ([place]) 05/12/2020   Bipolar I disorder with depression, severe ([place]) 05/10/2020   Overdose 05/03/2020   Chronic pain syndrome 01/31/2020   Homelessness 12/27/2019   Colostomy status ([place]) 12/16/2019   History of pulmonary embolism 12/16/2019   Right foot drop 12/15/2019   GAD (generalized anxiety disorder) 10/20/2019   MDD (major depressive disorder) 10/20/2019   PTSD (post-traumatic stress disorder) 10/20/2019   Bipolar affective disorder, currently depressed, mild ([place]) 10/16/2019   Protrusion of lumbar intervertebral disc 10/05/2019   Bipolar 1 disorder ([place]) 07/21/2019   History of colon cancer 07/21/2019   Schizoaffective disorder, bipolar type ([place]) 06/03/2019   Nicotine dependence, cigarettes, uncomplicated 94/70/7615   Crohn's disease with  complication ([place]) 18/34/3735    Past Surgical History:  Procedure Laterality Date   ABDOMINAL SURGERY     COLON SURGERY     IR RADIOLOGIST EVAL & MGMT  08/31/2019   OSTOMY     SMALL INTESTINE SURGERY         History reviewed. No pertinent family history.  Social History   Tobacco Use   Smoking status: Every Day    Packs/day: 1.00    Types: Cigarettes   Smokeless tobacco: Never  Vaping Use   Vaping Use: Never used  Substance Use Topics   Alcohol use: Yes    Comment: weekend   Drug use: Not Currently    Home Medications Prior to Admission medications   Medication Sig Start Date End Date Taking? Authorizing Provider  carisoprodol (SOMA) 250 MG tablet Take 250 mg by mouth daily as needed (muscle relaxing).    [provider]  clonazePAM (KLONOPIN) 0.5 MG tablet TAKE 1/2 TABLET BY MOUTH TWO TIMES DAILY Patient taking differently: No sig reported 12/29/19 01/31/21  [name], MD  diphenoxylate-atropine (LOMOTIL) 2.5-0.025 MG tablet Take 1 tablet by mouth 4 (four) times daily as needed for diarrhea or loose stools.    [provider]  divalproex (DEPAKOTE) 250 MG DR tablet Take 1 tablet (250 mg total) by mouth once every morning. Patient taking differently: Take 250 mg by mouth 3 (three) times daily. 02/07/21 03/09/21  Carrie Mew, MD  divalproex (DEPAKOTE) 500 MG DR tablet Take 1 tablet (500 mg total) by mouth at bedtime. 02/04/21   Coralee Pesa, MD  divalproex (DEPAKOTE) 500 MG DR tablet Take 1 tablet (500 mg total) by mouth once daily at bedtime. 02/07/21 02/07/22  Carrie Mew, MD  DULoxetine (CYMBALTA) 60 MG capsule Take 1 capsule (60 mg total) by mouth daily. 04/19/20   Salley Slaughter, NP  DULoxetine (CYMBALTA) 30 MG capsule Take 2 capsules (60 mg total) by mouth 2 (two) times daily. Patient not taking: Reported on 02/17/2021 02/07/21 03/09/21  Carrie Mew, MD  gabapentin (NEURONTIN) 300 MG capsule Take 2 capsules (600 mg  total) by mouth 3 (three) times daily. Patient not taking: No sig reported 05/10/20   Amin, Jeanella Flattery, MD  hydrOXYzine (ATARAX/VISTARIL) 10 MG tablet TAKE 1 TABLET (10 MG TOTAL) BY MOUTH 3 (THREE) TIMES DAILY AS NEEDED. Patient not taking: No sig reported 04/24/20 04/24/21  [name], MD  methocarbamol (ROBAXIN) 500 MG tablet Take 1 tablet (500 mg total) by mouth 2 (two) times daily. 02/03/21   Alroy Bailiff, Margaux, PA-C  naloxone [facility]) nasal spray 4 mg/0.1 mL PLACE 1 SPRAY (4 MG TOTAL) INTO THE NOSE AS NEEDED FOR OVERDOSE SYMPTOMS FROM OPIATE. SEE ADMINISTRATION INSTRUCTIONS Patient taking differently: Place 1 spray into the nose once as needed (overdose). 03/15/20 03/15/21  [name], MD  potassium chloride (KLOR-CON) 10 MEQ tablet Take 1 tablet (10 mEq total) by mouth 2 (two) times daily. Patient not taking: No sig reported 06/09/20   Montine Circle, PA-C  predniSONE (DELTASONE) 10 MG tablet Take 6 tablets by mouth on day 1, 5 tablets on day 2, 4 tablets on day 3, 3 tabets on day 4, 2 tablets on day 5, take 1 tablet on day 6 02/03/21   Alroy Bailiff, Margaux, PA-C  QUEtiapine (SEROQUEL) 400 MG tablet Take 1 tablet (400 mg total) by mouth once daily at bedtime. 02/07/21 02/07/22  Carrie Mew, MD  QUEtiapine (SEROQUEL) 50 MG tablet Take 1 tablet (50 mg total) by mouth once every morning. Patient taking differently: Take 50 mg by mouth at bedtime. 02/07/21 03/09/21  Carrie Mew, MD    Allergies    Azathioprine, Ciprofloxacin, Mesalamine, Metronidazole, Other, Sulfa antibiotics, Barium sulfate, and Barium-containing compounds  Review of Systems   Review of Systems  Constitutional:  Negative for fever.  HENT:  Negative for congestion.   Respiratory:  Negative for shortness of breath.   Cardiovascular:  Negative for chest pain.  Gastrointestinal:  Negative for abdominal distention.  Genitourinary:        Ostomy bag issues  Neurological:  Negative for dizziness and headaches.    Physical Exam Updated Vital Signs BP 133/90 (BP Location: Left Arm)   Pulse 100   Temp 99 F (37.2 C) (Oral)   Resp 18   Ht 6' 1"  (1.854 m)   Wt 99.8 kg   SpO2 95%   BMI 29.03 kg/m   Physical Exam Vitals and nursing note reviewed.  Constitutional:      General: He is not in acute distress.    Appearance: Normal appearance. He is not ill-appearing.  HENT:     Head: Normocephalic and atraumatic.  Eyes:     General: No scleral icterus.       Right eye: No discharge.        Left eye: No discharge.     Conjunctiva/sclera: Conjunctivae normal.  Pulmonary:     Effort: Pulmonary effort is normal.  Breath sounds: No stridor.  Skin:    Comments: Ostomy bag in place on abdomen.  I do not appreciate any surrounding cellulitis.  Neurological:     Mental Status: He is alert and oriented to person, place, and time. Mental status is at baseline.    ED Results / Procedures / Treatments   Labs (all labs ordered are listed, but only abnormal results are displayed) Labs Reviewed - No data to display  EKG None  Radiology No results found.  Procedures Procedures   Medications Ordered in ED Medications - No data to display  ED Course  I have reviewed the triage vital signs and the nursing notes.  Pertinent labs & imaging results that were available during my care of the patient were reviewed by me and considered in my medical decision making (see chart for details).    MDM Rules/Calculators/A&P                           Patient presents with complaints of his colostomy bag leaking.  There is no evidence of herniation.  No evidence of infection.  Will have a new colostomy bag placed.  TOC consult order placed by RN.  I think this is reasonable.  Hopefully will be able to follow-up with him and aid him in receiving his ostomy supplies elsewhere.  Final Clinical Impression(s) / ED Diagnoses Final diagnoses:  Colostomy care [facility])    Rx / [place] Orders ED Discharge  Orders     None        Tedd Sias, [place] 02/23/21 0228    Shanon Rosser, MD 02/23/21 [phone]

## 2021-02-23 NOTE — ED Notes (Signed)
Pt is refusing to come back to a room because he does not want to give up his knife.

## 2021-03-08 ENCOUNTER — Encounter (HOSPITAL_COMMUNITY): Payer: Self-pay

## 2021-03-08 ENCOUNTER — Emergency Department (HOSPITAL_COMMUNITY)
Admission: EM | Admit: 2021-03-08 | Discharge: 2021-03-08 | Disposition: A | Discharge status: Home / Self Care | Payer: Self-pay | Attending: Emergency Medicine | Admitting: Emergency Medicine

## 2021-03-08 DIAGNOSIS — Z433 Encounter for attention to colostomy: Secondary | ICD-10-CM | POA: Insufficient documentation

## 2021-03-08 DIAGNOSIS — Z7189 Other specified counseling: Secondary | ICD-10-CM

## 2021-03-08 DIAGNOSIS — R053 Chronic cough: Secondary | ICD-10-CM | POA: Insufficient documentation

## 2021-03-08 DIAGNOSIS — Z85038 Personal history of other malignant neoplasm of large intestine: Secondary | ICD-10-CM | POA: Insufficient documentation

## 2021-03-08 DIAGNOSIS — F17210 Nicotine dependence, cigarettes, uncomplicated: Secondary | ICD-10-CM | POA: Insufficient documentation

## 2021-03-08 NOTE — ED Notes (Signed)
Pt provided supplied for new colostomy bag; pt requesting to change bag himself.

## 2021-03-08 NOTE — ED Provider Notes (Signed)
[facility] DEPT Provider Note   CSN: [id number] Arrival date & time: 03/08/21  5427     History Chief Complaint  Patient presents with   URI   Needs colostomy bag    Juan Hester is a 40 y.o. male with a history of Crohn's disease, chronic ostomy, presenting to the ED with request for ostomy bag change.  Patient reports that his bag "busted open" last night.  He reports that his plaster was closed today.  He came to the ER for bag change.  He reports he has had chronic smoker's cough, some congestion, does not want to be tested for any types of infection including viral illness.  He says he feels fine otherwise and wants to leave  HPI     Past Medical History:  Diagnosis Date   Acute renal failure ([place]) 12/16/2019   Anxiety    per pt report   Colon cancer ([place])    Crohn disease ([place])    Depression    per pt report   ETOH abuse 10/16/2019   Foot drop, right    Multiple subsegmental pulmonary emboli without acute cor pulmonale ([place]) 07/21/2019   Myocardial infarction ([place])    PE (pulmonary thromboembolism) ([place])    PTSD (post-traumatic stress disorder)    Thyroid disease     Patient Active Problem List   Diagnosis Date Noted   Suicide attempt by drug overdose ([place]) 05/12/2020   Bipolar I disorder with depression, severe ([place]) 05/10/2020   Overdose 05/03/2020   Chronic pain syndrome 01/31/2020   Homelessness 12/27/2019   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/16/2019   History of pulmonary embolism 12/16/2019   Right foot drop 12/15/2019   GAD (generalized anxiety disorder) 10/20/2019   MDD (major depressive disorder) 10/20/2019   PTSD (post-traumatic stress disorder) 10/20/2019   Bipolar affective disorder, currently depressed, mild ([place]) 10/16/2019   Protrusion of lumbar intervertebral disc 10/05/2019   Bipolar 1 disorder ([place]) 07/21/2019   History of colon cancer 07/21/2019   Schizoaffective disorder, bipolar type ([place]) 06/03/2019   Nicotine  dependence, cigarettes, uncomplicated 10/06/7626   Crohn's disease with complication ([place]) 31/51/7616    Past Surgical History:  Procedure Laterality Date   ABDOMINAL SURGERY     COLON SURGERY     IR RADIOLOGIST EVAL & MGMT  08/31/2019   OSTOMY     SMALL INTESTINE SURGERY         No family history on file.  Social History   Tobacco Use   Smoking status: Every Day    Packs/day: 1.00    Types: Cigarettes   Smokeless tobacco: Never  Vaping Use   Vaping Use: Never used  Substance Use Topics   Alcohol use: Yes    Comment: weekend   Drug use: Not Currently    Home Medications Prior to Admission medications   Medication Sig Start Date End Date Taking? Authorizing Provider  carisoprodol (SOMA) 250 MG tablet Take 250 mg by mouth daily as needed (muscle relaxing).    [provider]  clonazePAM (KLONOPIN) 0.5 MG tablet TAKE 1/2 TABLET BY MOUTH TWO TIMES DAILY Patient taking differently: No sig reported 12/29/19 01/31/21  [name], MD  diphenoxylate-atropine (LOMOTIL) 2.5-0.025 MG tablet Take 1 tablet by mouth 4 (four) times daily as needed for diarrhea or loose stools.    [provider]  divalproex (DEPAKOTE) 250 MG DR tablet Take 1 tablet (250 mg total) by mouth once every morning. Patient taking differently: Take 250 mg by  mouth 3 (three) times daily. 02/07/21 03/09/21  Carrie Mew, MD  divalproex (DEPAKOTE) 500 MG DR tablet Take 1 tablet (500 mg total) by mouth at bedtime. 02/04/21   Coralee Pesa, MD  divalproex (DEPAKOTE) 500 MG DR tablet Take 1 tablet (500 mg total) by mouth once daily at bedtime. 02/07/21 02/07/22  Carrie Mew, MD  DULoxetine (CYMBALTA) 60 MG capsule Take 1 capsule (60 mg total) by mouth daily. 04/19/20   Salley Slaughter, NP  DULoxetine (CYMBALTA) 30 MG capsule Take 2 capsules (60 mg total) by mouth 2 (two) times daily. Patient not taking: Reported on 02/17/2021 02/07/21 03/09/21  Carrie Mew, MD  gabapentin  (NEURONTIN) 300 MG capsule Take 2 capsules (600 mg total) by mouth 3 (three) times daily. Patient not taking: No sig reported 05/10/20   Amin, Jeanella Flattery, MD  hydrOXYzine (ATARAX/VISTARIL) 10 MG tablet TAKE 1 TABLET (10 MG TOTAL) BY MOUTH 3 (THREE) TIMES DAILY AS NEEDED. Patient not taking: No sig reported 04/24/20 04/24/21  [name], MD  methocarbamol (ROBAXIN) 500 MG tablet Take 1 tablet (500 mg total) by mouth 2 (two) times daily. 02/03/21   Alroy Bailiff, Margaux, PA-C  naloxone [facility]) nasal spray 4 mg/0.1 mL PLACE 1 SPRAY (4 MG TOTAL) INTO THE NOSE AS NEEDED FOR OVERDOSE SYMPTOMS FROM OPIATE. SEE ADMINISTRATION INSTRUCTIONS Patient taking differently: Place 1 spray into the nose once as needed (overdose). 03/15/20 03/15/21  [name], MD  potassium chloride (KLOR-CON) 10 MEQ tablet Take 1 tablet (10 mEq total) by mouth 2 (two) times daily. Patient not taking: No sig reported 06/09/20   Montine Circle, PA-C  predniSONE (DELTASONE) 10 MG tablet Take 6 tablets by mouth on day 1, 5 tablets on day 2, 4 tablets on day 3, 3 tabets on day 4, 2 tablets on day 5, take 1 tablet on day 6 02/03/21   Alroy Bailiff, Margaux, PA-C  QUEtiapine (SEROQUEL) 400 MG tablet Take 1 tablet (400 mg total) by mouth once daily at bedtime. 02/07/21 02/07/22  Carrie Mew, MD  QUEtiapine (SEROQUEL) 50 MG tablet Take 1 tablet (50 mg total) by mouth once every morning. Patient taking differently: Take 50 mg by mouth at bedtime. 02/07/21 03/09/21  Carrie Mew, MD    Allergies    Azathioprine, Ciprofloxacin, Mesalamine, Metronidazole, Other, Sulfa antibiotics, Barium sulfate, and Barium-containing compounds  Review of Systems   Review of Systems  Constitutional:  Negative for chills and fever.  HENT:  Positive for congestion. Negative for sore throat.   Eyes:  Negative for pain and visual disturbance.  Respiratory:  Positive for cough. Negative for shortness of breath.   Cardiovascular:  Negative for chest  pain and palpitations.  Gastrointestinal:  Negative for abdominal pain and vomiting.  Genitourinary:  Negative for dysuria and hematuria.  Musculoskeletal:  Negative for arthralgias and back pain.  Skin:  Negative for color change and rash.  Neurological:  Negative for seizures and syncope.  All other systems reviewed and are negative.  Physical Exam Updated Vital Signs BP (!) 131/97 (BP Location: Right Arm)   Pulse 96   Temp 99 F (37.2 C) (Oral)   Resp 18   SpO2 96%   Physical Exam Constitutional:      General: He is not in acute distress. HENT:     Head: Normocephalic and atraumatic.  Eyes:     Conjunctiva/sclera: Conjunctivae normal.     Pupils: Pupils are equal, round, and reactive to light.  Cardiovascular:     Rate and  Rhythm: Normal rate and regular rhythm.  Pulmonary:     Effort: Pulmonary effort is normal. No respiratory distress.  Abdominal:     Comments: Patient refuses to allow me to examine ostomy site or take off jacket  Skin:    General: Skin is warm and dry.  Neurological:     General: No focal deficit present.     Mental Status: He is alert. Mental status is at baseline.    ED Results / Procedures / Treatments   Labs (all labs ordered are listed, but only abnormal results are displayed) Labs Reviewed - No data to display  EKG None  Radiology No results found.  Procedures Procedures   Medications Ordered in ED Medications - No data to display  ED Course  I have reviewed the triage vital signs and the nursing notes.  Pertinent labs & imaging results that were available during my care of the patient were reviewed by me and considered in my medical decision making (see chart for details).  Patient is here for ostomy supplies.  His bag was exchanged and an extra bag was provided to him.  He does not want me to examine his abdomen when I let me check the ostomy site.  He has no other acute complaints.  He likely has a viral illness or chronic  smoker's cough, afebrile, doubt sepsis or pneumonia.  He does not want a COVID or flu test.  He is asking to leave.  Okay for discharge   Final Clinical Impression(s) / ED Diagnoses Final diagnoses:  Encounter for ostomy care education  Chronic cough    Rx / DC Orders ED Discharge Orders     None        Anaiya Wisinski, Carola Rhine, MD 03/08/21 1008

## 2021-03-08 NOTE — ED Triage Notes (Signed)
Pt arrives via ems. States he has a cold and is also here to get his colostomy bag change.

## 2021-03-09 ENCOUNTER — Other Ambulatory Visit: Payer: Self-pay

## 2021-03-09 ENCOUNTER — Emergency Department (HOSPITAL_COMMUNITY)
Admission: EM | Admit: 2021-03-09 | Discharge: 2021-03-10 | Disposition: A | Discharge status: Home / Self Care | Payer: Self-pay | Attending: Emergency Medicine | Admitting: Emergency Medicine

## 2021-03-09 ENCOUNTER — Encounter (HOSPITAL_COMMUNITY): Payer: Self-pay | Admitting: Emergency Medicine

## 2021-03-09 DIAGNOSIS — Z5321 Procedure and treatment not carried out due to patient leaving prior to being seen by health care provider: Secondary | ICD-10-CM | POA: Insufficient documentation

## 2021-03-09 DIAGNOSIS — K9409 Other complications of colostomy: Secondary | ICD-10-CM | POA: Insufficient documentation

## 2021-03-09 DIAGNOSIS — Y833 Surgical operation with formation of external stoma as the cause of abnormal reaction of the patient, or of later complication, without mention of misadventure at the time of the procedure: Secondary | ICD-10-CM | POA: Insufficient documentation

## 2021-03-09 NOTE — ED Triage Notes (Signed)
Patient BIBA d/t issues with colostomy bag. He reports he has not been able to get the bag to stick. VS WDL.

## 2021-03-15 ENCOUNTER — Encounter: Payer: Self-pay | Admitting: Physician Assistant

## 2021-03-15 ENCOUNTER — Other Ambulatory Visit (HOSPITAL_COMMUNITY): Payer: Self-pay

## 2021-03-15 ENCOUNTER — Other Ambulatory Visit: Payer: Self-pay | Admitting: Critical Care Medicine

## 2021-03-15 ENCOUNTER — Telehealth: Payer: Self-pay

## 2021-03-15 DIAGNOSIS — F411 Generalized anxiety disorder: Secondary | ICD-10-CM

## 2021-03-15 DIAGNOSIS — F3131 Bipolar disorder, current episode depressed, mild: Secondary | ICD-10-CM

## 2021-03-15 MED ORDER — GABAPENTIN 300 MG PO CAPS: 600.0000 mg | ORAL_CAPSULE | Freq: Three times a day (TID) | ORAL | 0 refills | Status: DC

## 2021-03-15 MED ORDER — DIVALPROEX SODIUM 250 MG PO DR TAB
500.0000 mg | DELAYED_RELEASE_TABLET | Freq: Every day | ORAL | 4 refills | Status: AC
  Filled 2021-03-15: qty 60, 30d supply, fill #0
  Filled 2021-04-18: qty 60, 30d supply, fill #1

## 2021-03-15 MED ORDER — QUETIAPINE FUMARATE 400 MG PO TABS
400.0000 mg | ORAL_TABLET | Freq: Every day | ORAL | 0 refills | Status: AC
  Filled 2021-03-15: qty 30, 30d supply, fill #0

## 2021-03-15 MED ORDER — CARISOPRODOL 250 MG PO TABS
250.0000 mg | ORAL_TABLET | Freq: Every day | ORAL | 0 refills | Status: AC | PRN
  Filled 2021-03-15: qty 30, 30d supply, fill #0

## 2021-03-15 MED ORDER — DULOXETINE HCL 60 MG PO CPEP
60.0000 mg | ORAL_CAPSULE | Freq: Every day | ORAL | 2 refills | Status: AC
  Filled 2021-03-15: qty 60, 60d supply, fill #0
  Filled 2021-04-18: qty 60, 60d supply, fill #1

## 2021-03-15 MED ORDER — QUETIAPINE FUMARATE 50 MG PO TABS
50.0000 mg | ORAL_TABLET | Freq: Every morning | ORAL | 0 refills | Status: AC
  Filled 2021-03-15: qty 30, 30d supply, fill #0

## 2021-03-15 NOTE — Telephone Encounter (Signed)
Provided Dr Joya Gaskins with ostomy bags and skin barrier/flanges for patient. Referral sent to [facility] requesting assistance with ostomy supply selection/ordering

## 2021-03-15 NOTE — Progress Notes (Addendum)
Pt seen by Dr Joya Gaskins.  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 The man accused him of stealing, he was in jail because he could not post bail.  The man died and the charges were dropped.   He is out of ostomy supplies, that is his most urgent problem.   He has a few things, but not everything. It has been 2-1/2 days, the maximum is 3. He may have to go to the ER to get the bag changed.   There is an ostomy clinic in town, they may be able to see him.   He is being evaluated for disability, if he gets approved, he may get a partial back payment (15 yrs) about the middle of December.   He has been to the ER 6 times in the month of November for colostomy care.   He has back pain, chronic, but does not think ibuprofen or Tylenol will help.   He is out of all medications. He got good benefit from [facility]. He was taking the Depakote at 500 mg qd. He prefers 2- 250 mg pills, the 500 mg pills are hard to swallow, duloxitine, Seroquel 400 and 50 mg  He got depressed at one point, from things his son and daughter were saying. They are mid-teens.  He took a deliberate overdose and was in the hospital, but does not feel he will do this again.   He hopes to get Disability and then go out [place].   He does not absorb food very well, oral vitamins no help. He was given some protein drinks.   Rosaria Ferries, PA-C 03/15/2021 3:25 PM

## 2021-03-19 ENCOUNTER — Telehealth: Payer: Self-pay

## 2021-03-19 NOTE — Telephone Encounter (Signed)
Call placed to patient and informed him that the ostomy supplies have been delivered to [facility]. He was very pleased because he said he would have to go to ED tonight because the bag he has on is coming off. He is planning to pick up the supplies at [facility] this afternoon

## 2021-03-19 NOTE — Telephone Encounter (Signed)
Copied from [place] [id number]. Topic: General - Other >> Mar 19, 2021  1:28 PM Pawlus, Brayton Layman A wrote: Reason for CRM: Pt stated [employer] will be delivering his ostomy supplies tomorrow 12/6 between 11am-1pm, pt requested a call when they arrive so he can come pick them up.

## 2021-03-19 NOTE — Telephone Encounter (Signed)
Patient picked up his box of ostomy supplies that were delivered today. He said he has an upcoming disability hearing next week and hopes to be approved and start receiving monthly disability income 04/2021. He is also hoping to be approved for Medicaid at that time. He confirmed that he has an appointment with Domenic Moras, FNP/[facility] 03/26/2021.

## 2021-03-22 ENCOUNTER — Other Ambulatory Visit: Payer: Self-pay

## 2021-03-26 ENCOUNTER — Other Ambulatory Visit: Payer: Self-pay

## 2021-03-26 ENCOUNTER — Ambulatory Visit (HOSPITAL_COMMUNITY)
Admission: RE | Admit: 2021-03-26 | Discharge: 2021-03-26 | Disposition: A | Discharge status: Home / Self Care | Payer: Self-pay | Source: Ambulatory Visit | Attending: Critical Care Medicine | Admitting: Critical Care Medicine

## 2021-03-26 DIAGNOSIS — K9400 Colostomy complication, unspecified: Secondary | ICD-10-CM

## 2021-03-26 DIAGNOSIS — F319 Bipolar disorder, unspecified: Secondary | ICD-10-CM | POA: Insufficient documentation

## 2021-03-26 DIAGNOSIS — Z933 Colostomy status: Secondary | ICD-10-CM | POA: Insufficient documentation

## 2021-03-26 DIAGNOSIS — K5090 Crohn's disease, unspecified, without complications: Secondary | ICD-10-CM | POA: Insufficient documentation

## 2021-03-26 DIAGNOSIS — K435 Parastomal hernia without obstruction or  gangrene: Secondary | ICD-10-CM | POA: Insufficient documentation

## 2021-03-26 NOTE — Progress Notes (Signed)
[facility]   Reason for visit:  LLQ colostomy  Parastomal hernia.  Refuses belt or any type of binding to peristomal skin.   States he can smell the pouch all of the time.  HPI:  Bipolar disorder, Crohn's disease and LLQ colostomy ROS  Review of Systems  Gastrointestinal:        Granulomas to stoma present.  Unable to apply silver due to sulfa allergy with rash.   Psychiatric/Behavioral:  The patient is nervous/anxious and is hyperactive.   All other systems reviewed and are negative. Vital signs:  There were no vitals taken for this visit. Exam:  Physical Exam Abdominal:     Comments: Parastomal hernia  Skin:    General: Skin is warm and dry.     Findings: Lesion present.  Neurological:     Mental Status: He is alert.  Psychiatric:     Comments: Bipolar disorder     Stoma type/location:  LLQ colostomy Stomal assessment/size:  1 1/4" with granulomas present to peristomal skin from 4 to 7 o'clock.  Noted allergy to silver and sulfa drugs so unable to treat with silver nitrate or aquacel Ag today.  Peristomal assessment:  Large bulging parastomal hernia.  Refuses all interventions that could improve his wear time and decrease odor.  Treatment options for stomal/peristomal skin: barrier ring  Skin adhesive to promote seal and 2piece pouch.  Not agreeable to stoma belt or abdominal binder.  Will allow barrier strips.  Output: soft brown stool Ostomy pouching: 2pc. [place]ister pouches with clip.  These are provided by patient assistance program. Hopes to be insured soon and we can improve pouching system.   Education provided:  explained that barrier strips can provide some support around hernia and hopefully improve odor.  After insurance is available, will try a filtered pouch with velcro secure closure.     Impression/dx  LLQ colostomy with parastomal hernia Discussion  See above.  We perform pouch change. Patient is anxious, up and pacing and minimally receptive to  new ideas.  Plan  Call office after disability/insurance hearing and we can discuss alternate options     Visit time: 45 minutes.   Domenic Moras FNP-BC

## 2021-03-28 ENCOUNTER — Encounter: Payer: Self-pay | Admitting: Physician Assistant

## 2021-03-28 NOTE — Progress Notes (Signed)
Pt seen by Dr Joya Gaskins  He did not get any drinks, they were given away to other people.  His foot is killing him.   He struggles w/ eating vegetables because of the colitis and the ostomy.  He can eat any meat, white bread, pasta, peanut butter, cheese, potatoes w/out skin, applesauce, eggs, pancakes.   Cannot eat gristle, otherwise any meat is ok.  The assembly line concept was explained to him.    He is asking for a note to get extra servings of the foods you can eat. It was written and given to Darryl, the head of the food service.  He was supposed to go to the ostomy clinic, says that was not helpful.  He has a box that is supposed to last 3 months, he feels it might last 2 months.   A lady from [facility] Patient Assistance program is going to help him.   A hearing is tomorrow, he is not sure how long it will take to get the money after that.   Rosaria Ferries, PA-C 03/28/2021 3:21 PM

## 2021-04-04 ENCOUNTER — Encounter: Payer: Self-pay | Admitting: Physician Assistant

## 2021-04-04 ENCOUNTER — Other Ambulatory Visit (HOSPITAL_COMMUNITY): Payer: Self-pay

## 2021-04-04 ENCOUNTER — Other Ambulatory Visit: Payer: Self-pay | Admitting: *Deleted

## 2021-04-04 DIAGNOSIS — K047 Periapical abscess without sinus: Secondary | ICD-10-CM

## 2021-04-04 MED ORDER — AMOXICILLIN-POT CLAVULANATE 875-125 MG PO TABS: 1.0000 | ORAL_TABLET | Freq: Two times a day (BID) | ORAL | Status: DC

## 2021-04-04 NOTE — Progress Notes (Addendum)
Pt seen by Dr Florene Glen.  He saw the ACT team and discussed his medications.  He wonders if we can prescribe a medication that starts with a T. He is not sure if it is trazodone or tramadol, but thinks it is trazodone.   He has both in his prior med list.  Dr Florene Glen advised that we could not prescribe either one.   He was prescribed Soma 250 mg, 1 tab daily, 30 pills.   Robaxin did not do anything, flexeril made him sleepy.   He has pain in his R foot. It is a cramping, burning pain. It comes from his back. It is helped by the [facility]. Sometimes it is a stabbing pain.   He says he never got the neurontin, it was prescribed, he just never got it. He says he has not been to the pharmacy.  He has a painful area in his gums. His L canine is also tender. The tooth beside it fell out during chemo. He has had problems since then, but the area has become more painful. He has not seen a dentist in a long time.   He was given protein drinks, Tylenol. The gabapentin was accidentally marked no print >> Bonnita Nasuti fixed that, he will get it tomorrow.   123/86, HR 120, 99%  He was given the augmentin for his tooth, Tylenol for pain, 7 protein drinks.    Rosaria Ferries, PA-C 04/04/2021 4:53 PM   40 yo with hx colon cancer, crohn's, PTSD and multipe other medical issues following up, asking for ensure and about meds for sleep - recommending he speak to act team given his possible med interactions.  Dental pain, no abscess noted, though tenderness, appears infected.  Prescribed augmentin.  Sounds like he hasn't gotten neurontin yet, Bonnita Nasuti looking into this.  VS 123/86, HR 120, 99% RA  Poor dentition, TTP along upper left teeth, poor dentition - erythematous gums. Tachycardic, regular (he notes this is chronic - denies CP, SOB - given precautions) NAD, unlabored  Augmentin for tooth, will work on gabapentin.  Given ensure.  Encouraged dental follow up.  Tachy, he says this is chronic, follow -  precautions given.  Fayrene Helper, MD

## 2021-04-05 ENCOUNTER — Other Ambulatory Visit: Payer: Self-pay | Admitting: Critical Care Medicine

## 2021-04-05 ENCOUNTER — Other Ambulatory Visit (HOSPITAL_COMMUNITY): Payer: Self-pay

## 2021-04-05 ENCOUNTER — Other Ambulatory Visit: Payer: Self-pay | Admitting: Family Medicine

## 2021-04-05 DIAGNOSIS — K047 Periapical abscess without sinus: Secondary | ICD-10-CM

## 2021-04-05 MED ORDER — AMOXICILLIN-POT CLAVULANATE 875-125 MG PO TABS
1.0000 | ORAL_TABLET | Freq: Two times a day (BID) | ORAL | 0 refills | Status: AC
  Filled 2021-04-05: qty 28, 14d supply, fill #0

## 2021-04-05 MED ORDER — GABAPENTIN 300 MG PO CAPS
600.0000 mg | ORAL_CAPSULE | Freq: Three times a day (TID) | ORAL | 0 refills | Status: DC
  Filled 2021-04-05: qty 90, 15d supply, fill #0

## 2021-04-05 NOTE — Progress Notes (Signed)
Refill on gabapentin

## 2021-04-11 ENCOUNTER — Encounter: Payer: Self-pay | Admitting: Physician Assistant

## 2021-04-11 NOTE — Progress Notes (Signed)
Pt seen by Dr Joya Gaskins  Pt tooth is much better after ABX and Tylenol  He needs protein shakes, want to know when he should take them. He was given 10 shakes, told to drink 2/day.   He can eat meat, white bread, pasta, potatoes w/out skin.  Breakfast is ok, lunch and dinner are harder.   His colostomy bags are doing ok, but there is an odor.  Lomotil helps the diarrhea,  but does not consistently take this.   Wonders if we have Zyrtec, he is having scalp itch. He was given cold cream, loratadine x 10 tabs.   150/101, HR 103, 100% RA  He has 16.5 oz of Red Bull today.  He was also given Dermasil for his body.   Rosaria Ferries, PA-C 04/11/2021 3:57 PM

## 2021-04-18 ENCOUNTER — Other Ambulatory Visit: Payer: Self-pay | Admitting: Critical Care Medicine

## 2021-04-18 ENCOUNTER — Other Ambulatory Visit (HOSPITAL_COMMUNITY): Payer: Self-pay

## 2021-04-19 ENCOUNTER — Other Ambulatory Visit: Payer: Self-pay | Admitting: Critical Care Medicine

## 2021-04-19 ENCOUNTER — Other Ambulatory Visit (HOSPITAL_COMMUNITY): Payer: Self-pay

## 2021-04-19 MED ORDER — GABAPENTIN 300 MG PO CAPS
600.0000 mg | ORAL_CAPSULE | Freq: Three times a day (TID) | ORAL | 0 refills | Status: AC
  Filled 2021-04-19: qty 90, 15d supply, fill #0

## 2021-04-19 NOTE — Telephone Encounter (Signed)
Requested medications are due for refill today.  yes  Requested medications are on the active medications list.  yes  Last refill. All 3 refilled on 03/15/2021  Future visit scheduled.   no  Notes to clinic.  Medication are not delegated.    Requested Prescriptions  Pending Prescriptions Disp Refills   carisoprodol (SOMA) 250 MG tablet 30 tablet 0    Sig: Take 1 tablet (250 mg total) by mouth daily as needed (muscle relaxing).     Not Delegated - Analgesics:  Muscle Relaxants Failed - 04/18/2021 11:04 AM      Failed - This refill cannot be delegated      Failed - Valid encounter within last 6 months    Recent Outpatient Visits           12 months ago Chronic pain syndrome   [place] [name], MD   1 year ago Lumbar radiculopathy   [place], Patrick E, MD   1 year ago Acute renal failure, unspecified acute renal failure type [facility])   [place] [name], MD   1 year ago Lumbar radiculopathy   [place], Patrick E, MD   1 year ago Lumbar radiculopathy   [place], MD               QUEtiapine (SEROQUEL) 400 MG tablet 30 tablet 0    Sig: Take 1 tablet (400 mg total) by mouth once daily at bedtime.     Not Delegated - Psychiatry:  Antipsychotics - Second Generation (Atypical) - quetiapine Failed - 04/18/2021 11:04 AM      Failed - This refill cannot be delegated      Failed - AST in normal range and within 180 days    AST  Date Value Ref Range Status  02/05/2021 54 (H) 15 - 41 U/L Final          Failed - Last BP in normal range    BP Readings from Last 1 Encounters:  03/26/21 (!) 150/102          Failed - Valid encounter within last 6 months    Recent Outpatient Visits           12 months ago Chronic pain syndrome   [place] [name], MD   1 year ago Lumbar radiculopathy   [place] [name], MD   1 year ago Acute renal failure, unspecified acute renal failure type [facility])   [place] [name], MD   1 year ago Lumbar radiculopathy   [place] [name], MD   1 year ago Lumbar radiculopathy   [place] [name], MD              Passed - ALT in normal range and within 180 days    ALT  Date Value Ref Range Status  02/05/2021 40 0 - 44 U/L Final          Passed - Completed PHQ-2 or PHQ-9 in the last 360 days       QUEtiapine (SEROQUEL) 50 MG tablet 30 tablet 0    Sig: Take 1 tablet (50 mg total) by mouth once every morning.  Not Delegated - Psychiatry:  Antipsychotics - Second Generation (Atypical) - quetiapine Failed - 04/18/2021 11:04 AM      Failed - This refill cannot be delegated      Failed - AST in normal range and within 180 days    AST  Date Value Ref Range Status  02/05/2021 54 (H) 15 - 41 U/L Final          Failed - Last BP in normal range    BP Readings from Last 1 Encounters:  03/26/21 (!) 150/102          Failed - Valid encounter within last 6 months    Recent Outpatient Visits           12 months ago Chronic pain syndrome   [place] [name], MD   1 year ago Lumbar radiculopathy   [place] [name], MD   1 year ago Acute renal failure, unspecified acute renal failure type [facility])   [place] [name], MD   1 year ago Lumbar radiculopathy   [place] [name], MD   1 year ago Lumbar radiculopathy   [place] [name], MD              Passed - ALT in normal range and within 180 days     ALT  Date Value Ref Range Status  02/05/2021 40 0 - 44 U/L Final          Passed - Completed PHQ-2 or PHQ-9 in the last 360 days

## 2021-04-19 NOTE — Telephone Encounter (Signed)
Sending to different pharmacy  Requested Prescriptions  Pending Prescriptions Disp Refills   gabapentin (NEURONTIN) 300 MG capsule 90 capsule 0    Sig: Take 2 capsules (600 mg total) by mouth 3 (three) times daily.     Neurology: Anticonvulsants - gabapentin Passed - 04/19/2021  1:25 AM      Passed - Valid encounter within last 12 months    Recent Outpatient Visits          12 months ago Chronic pain syndrome   [place] [name], MD   1 year ago Lumbar radiculopathy   [place], Patrick E, MD   1 year ago Acute renal failure, unspecified acute renal failure type [facility])    [facility] [name], MD   1 year ago Lumbar radiculopathy   [place], Patrick E, MD   1 year ago Lumbar radiculopathy   [place], Patrick E, MD

## 2021-10-22 IMAGING — DX DG LUMBAR SPINE COMPLETE 4+V
5 series · 5 of 5 positions shown · non-contrast
Comparison: CT lumbar spine, 3 6,021

CLINICAL DATA: Fall, acute back pain

EXAM:
LUMBAR SPINE - COMPLETE 4+ VIEW

[l-spine ap]
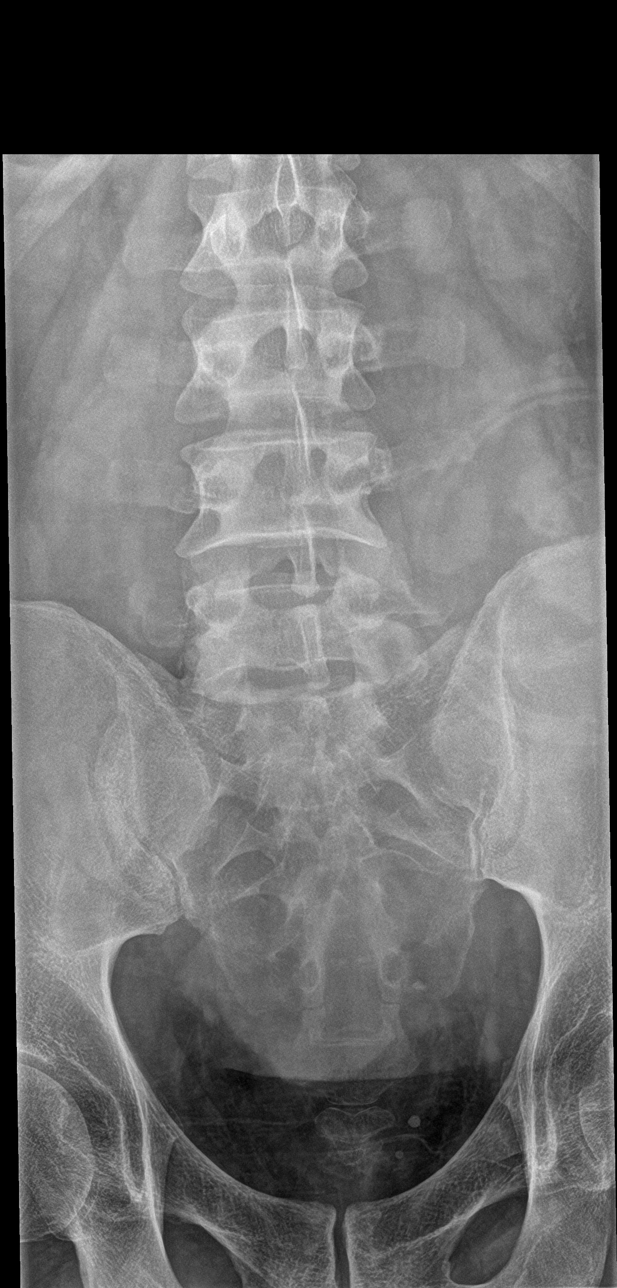

[l-spine obl (1 of 2)]
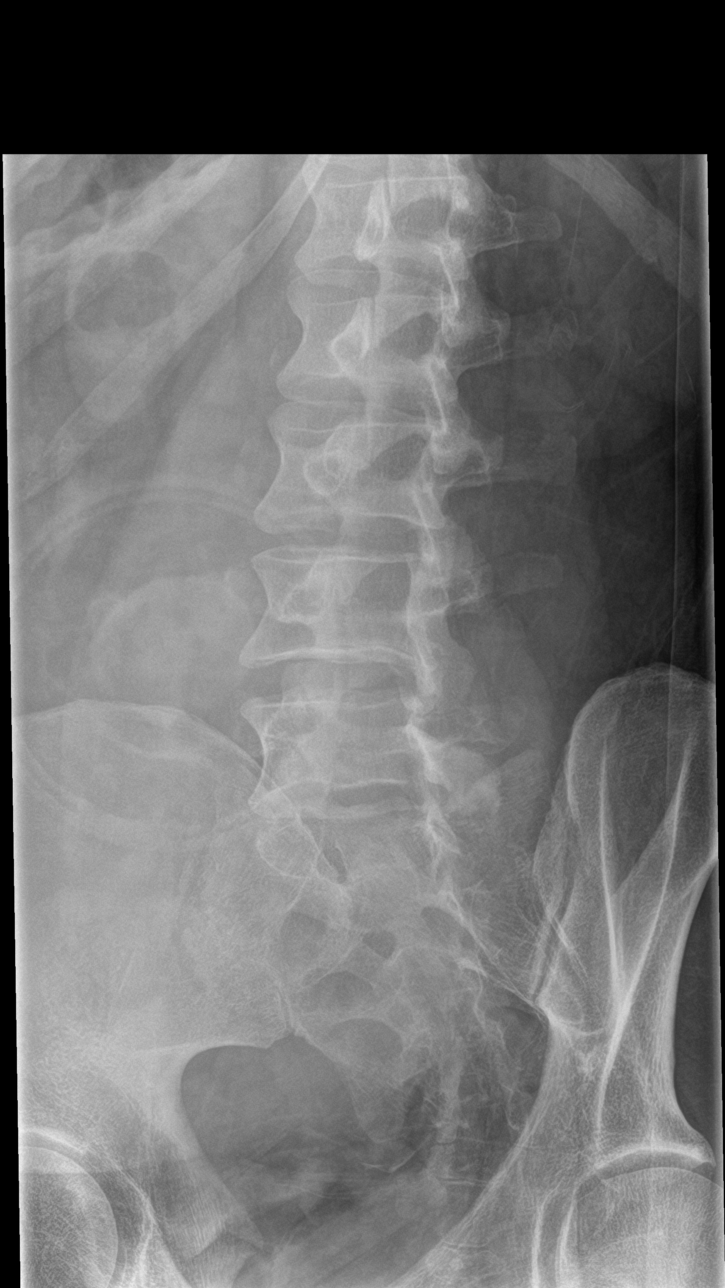

[l-spine obl (2 of 2)]
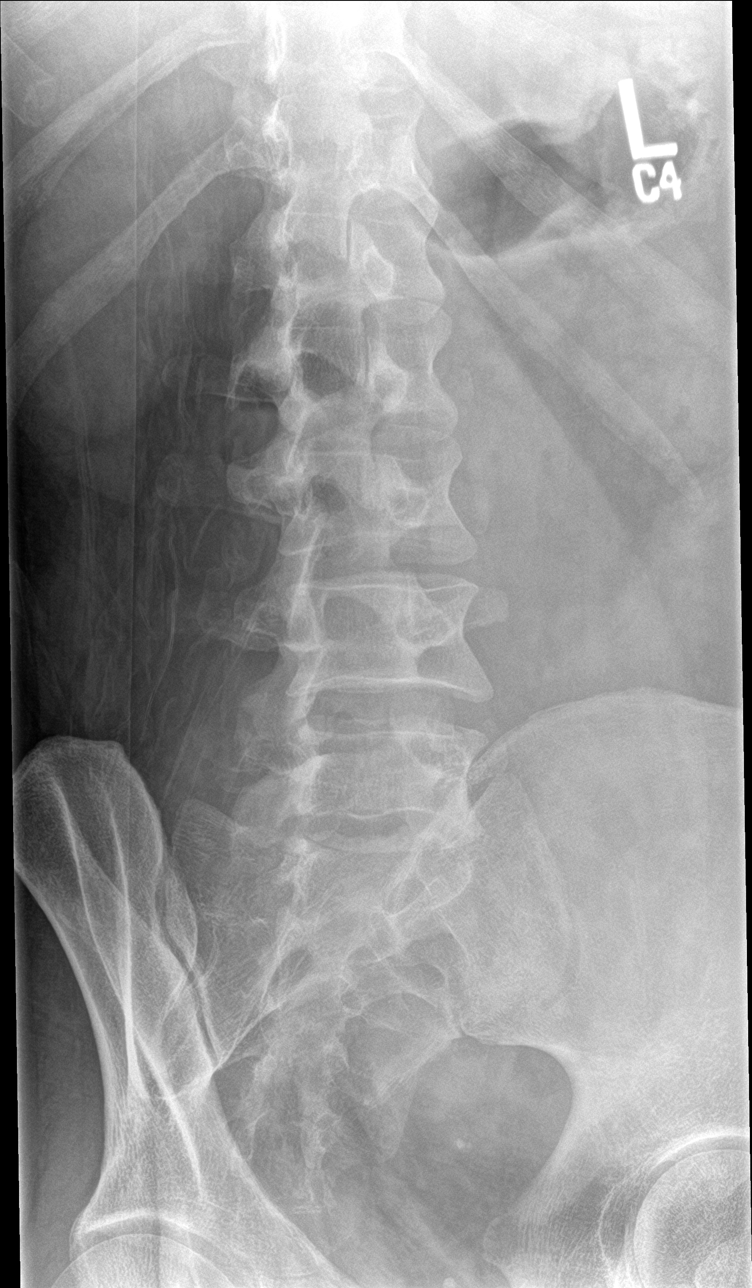

[l-spine spot]
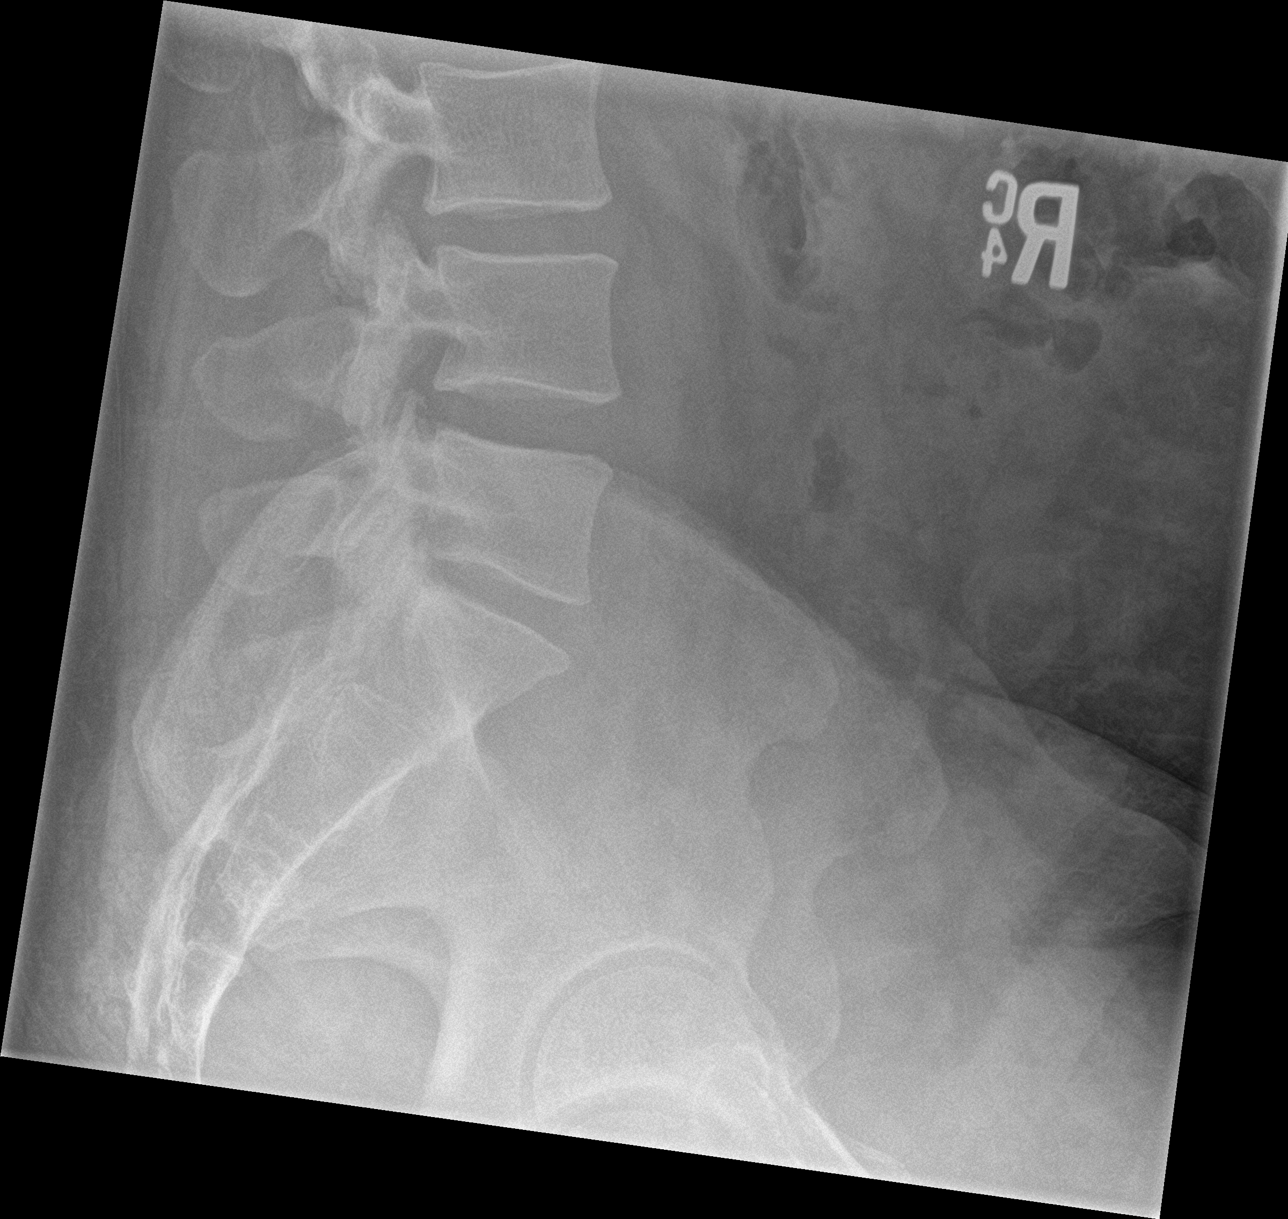

[l-spine lat]
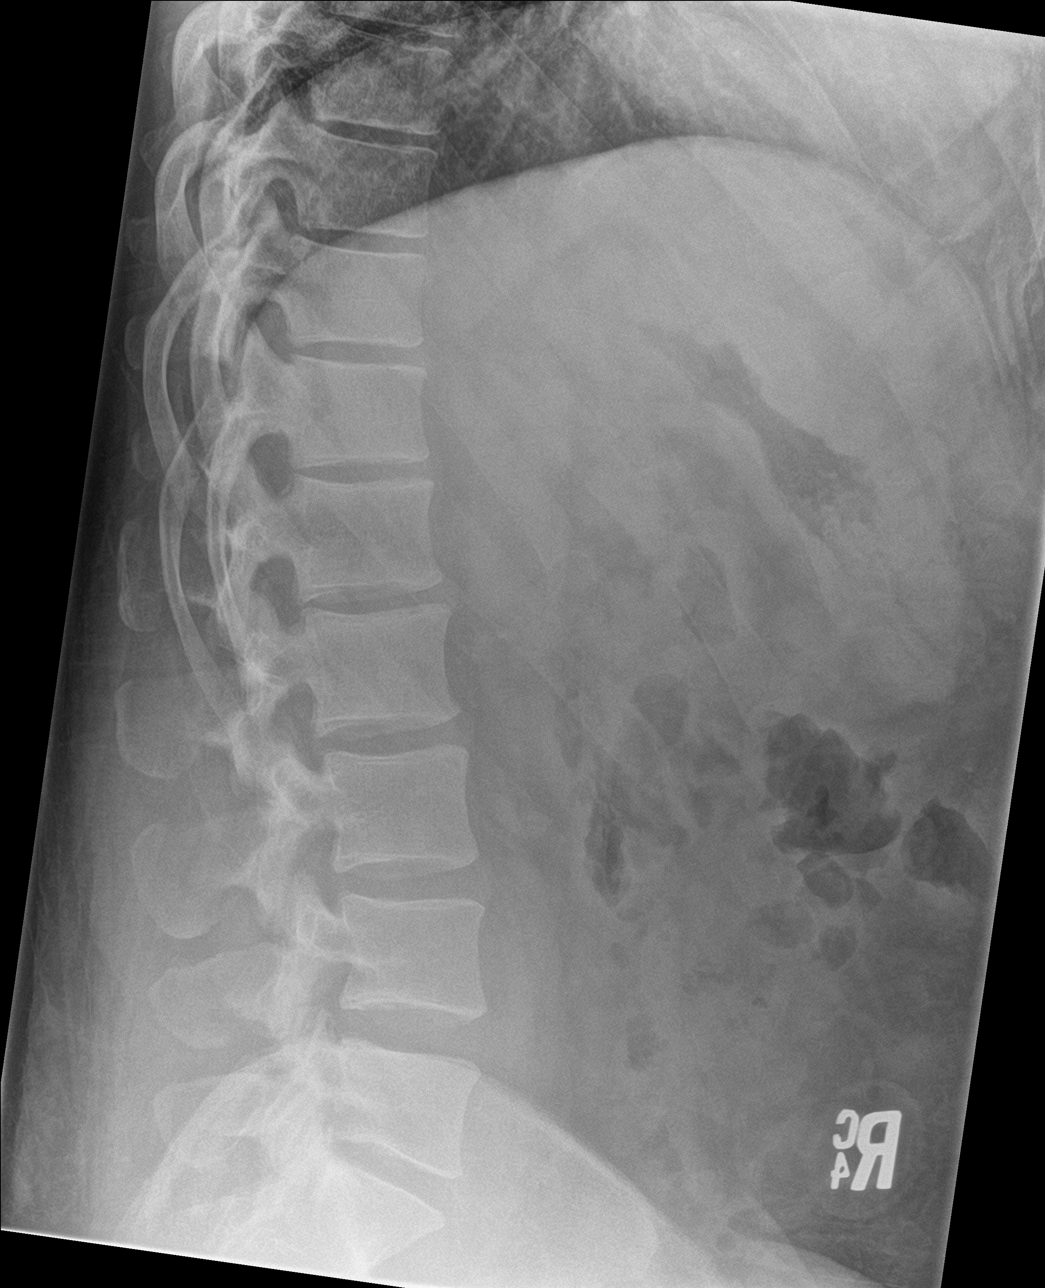

[5 of 5 positions shown; findings below may reference images not displayed]

FINDINGS: There is no evidence of lumbar spine fracture. Alignment is normal.
Intervertebral disc spaces are maintained.
IMPRESSION: No fracture or dislocation of the lumbar spine. Disc spaces and
vertebral body heights are preserved.

## 2021-10-29 IMAGING — CT CT ABD-PELV W/ CM
2 of 4 series · 14 of 46 positions shown, 16 images · IV contrast (Omni 300)
Comparison: 05/22/2019.

CLINICAL DATA: 39-year-old with acute onset of rectal bleeding that
began earlier this morning. Personal history of colon cancer for
which the patient underwent colectomy and descending colostomy
approximately 2 years ago. Current history of Crohn's disease.

EXAM:
CT ABDOMEN AND PELVIS WITH CONTRAST
TECHNIQUE: Multidetector CT imaging of the abdomen and pelvis was performed
using the standard protocol following bolus administration of
intravenous contrast.
CONTRAST:  100mL OMNIPAQUE IOHEXOL 300 MG/ML IV.

[Series 3: a/p w/ 5mm · axial · 0.89mm/px · z∈[+328,+828]mm · 11 of 115 slices shown, 13 images]
[im 10/115  soft-tissue]
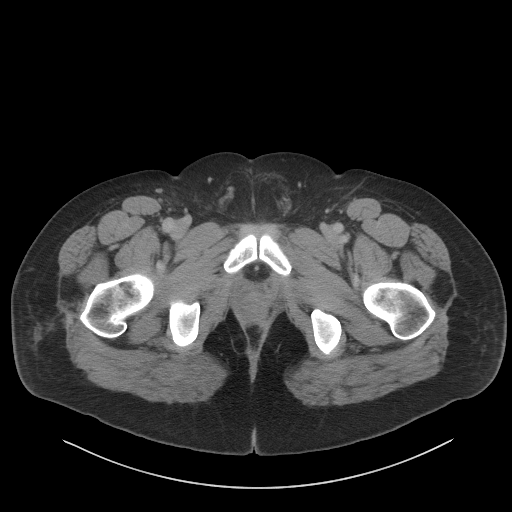
[im 10/115  bone]
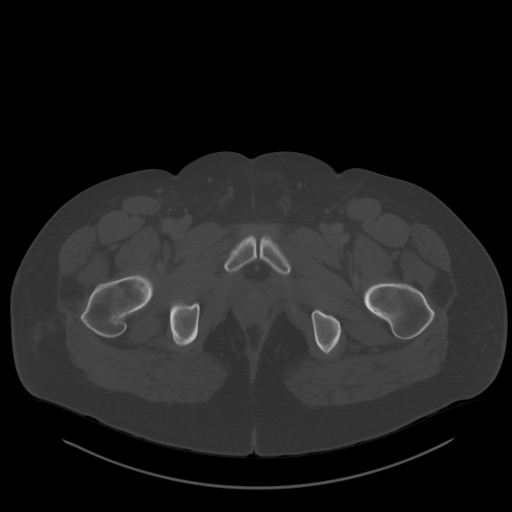
[im 20/115  soft-tissue]
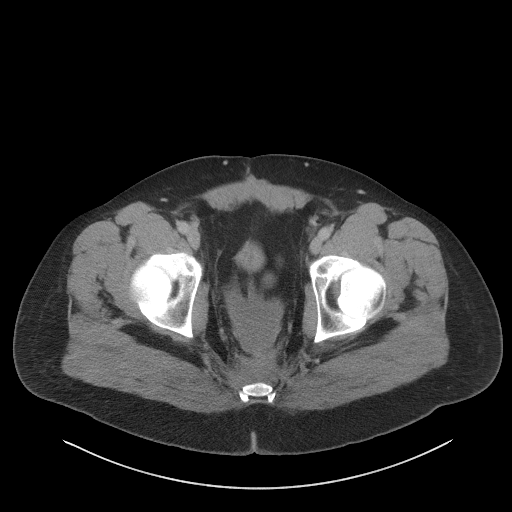
[im 30/115  soft-tissue]
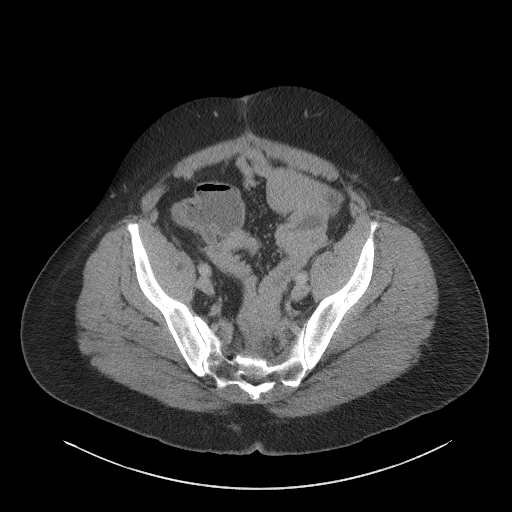
[im 40/115  soft-tissue]
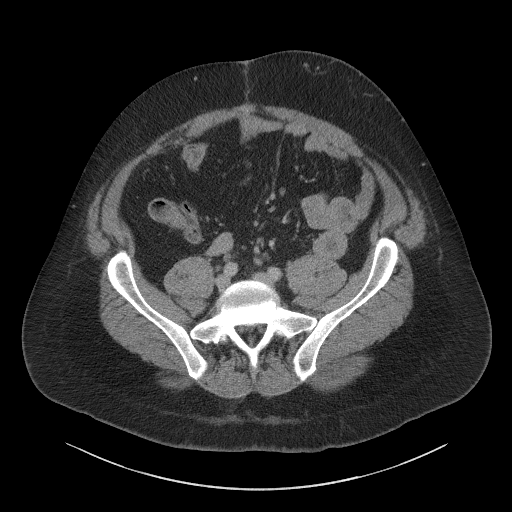
[im 50/115  soft-tissue]
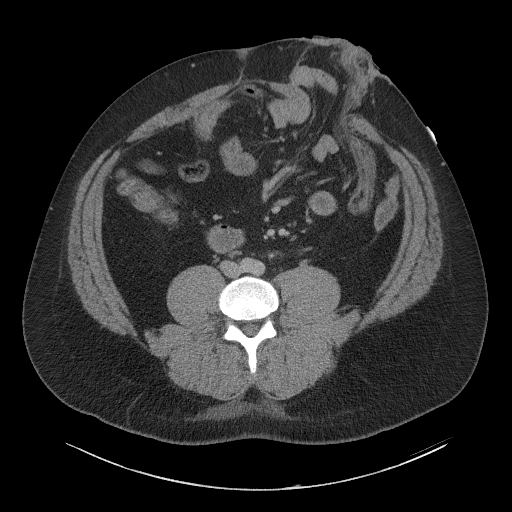
[im 60/115  soft-tissue]
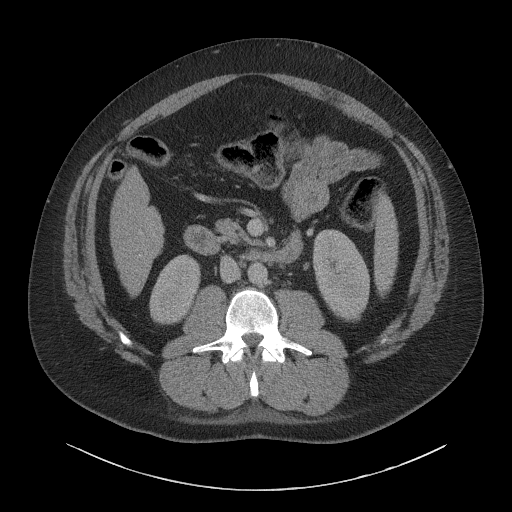
[im 70/115  soft-tissue]
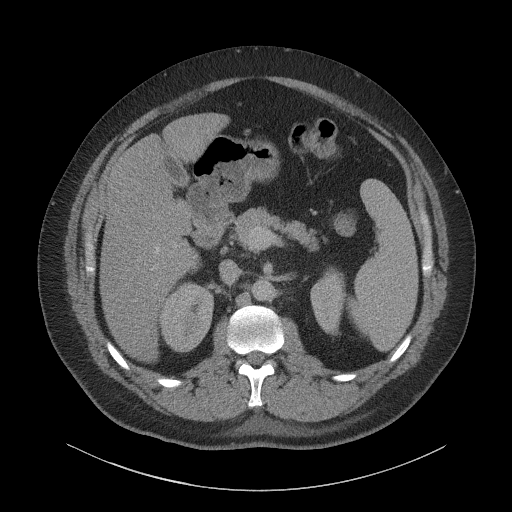
[im 80/115  soft-tissue]
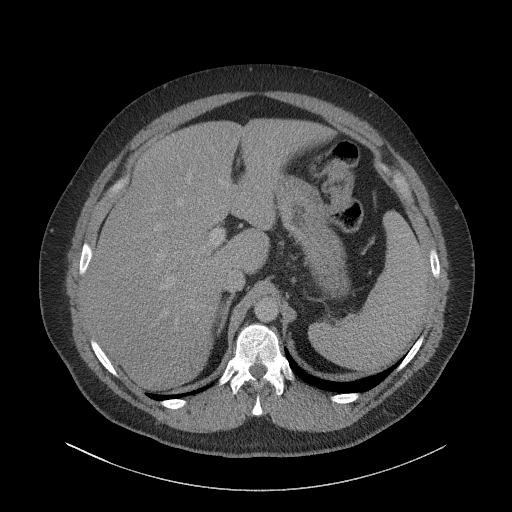
[im 90/115  soft-tissue]
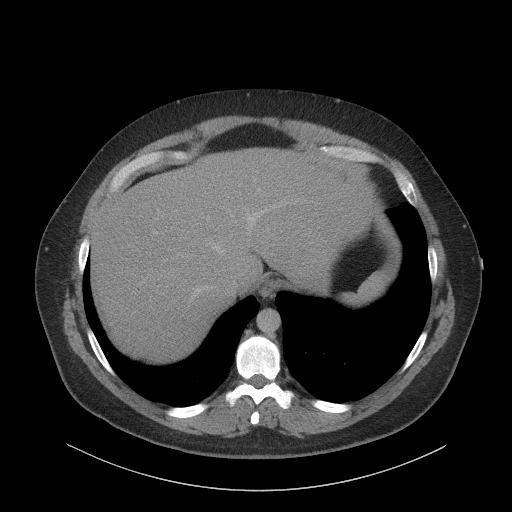
[im 90/115  bone]
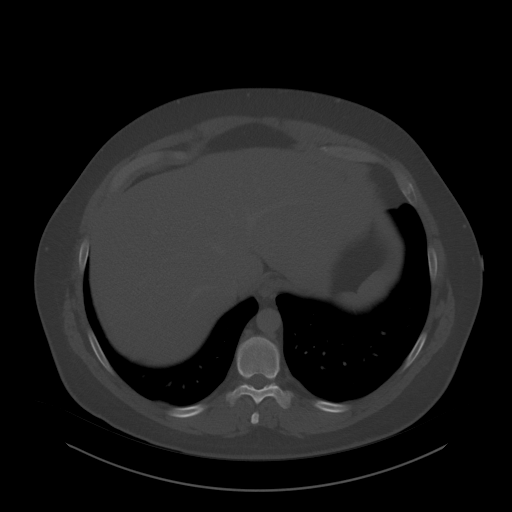
[im 100/115  soft-tissue]
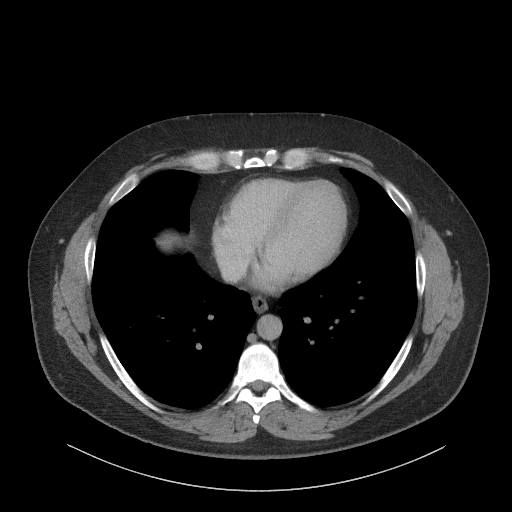
[im 110/115  soft-tissue]
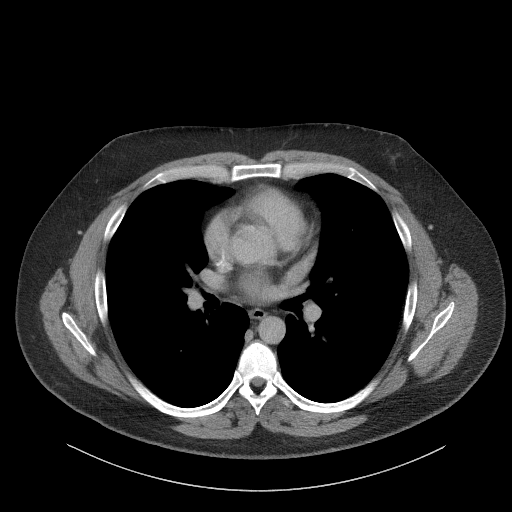

[Series 6: a/p w/ cor · coronal · 0.87mm/px · 3 of 190 slices shown]
[im 64/190  soft-tissue]
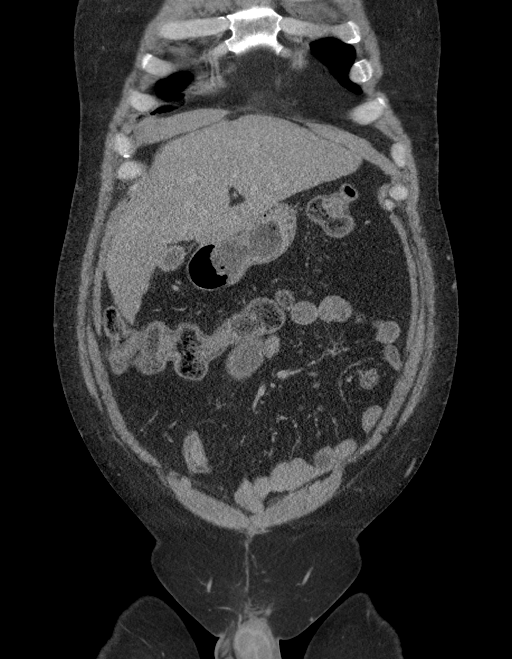
[im 85/190  soft-tissue]
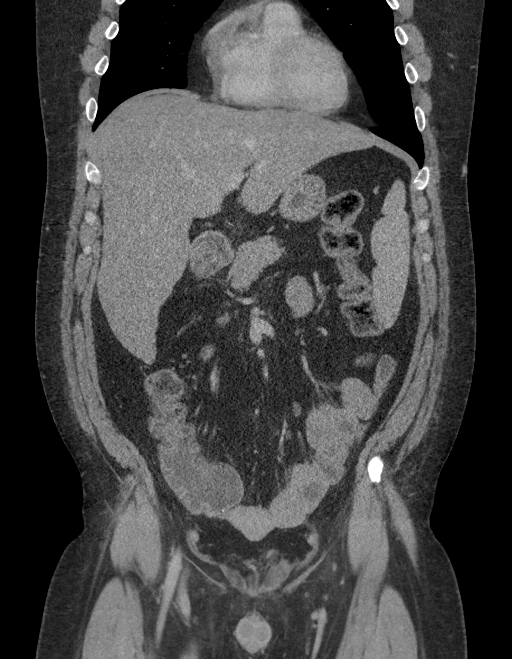
[im 106/190  soft-tissue]
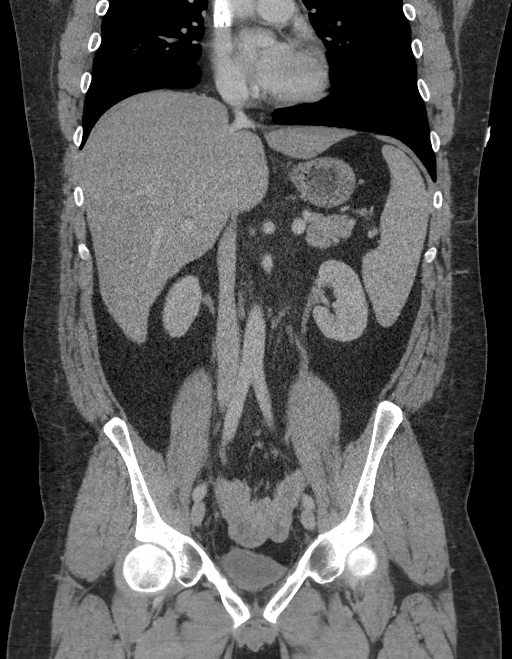

[14 of 46 positions shown; findings below may reference images not displayed]

FINDINGS: Lower chest: Respiratory motion blurred images of the lung bases.
Small bleb or air cyst in the LEFT LOWER LOBE. Visualized lung bases
clear. Heart size normal.

Hepatobiliary: Liver upper normal in size to mildly enlarged. Mildly
irregular contour of the inferior RIGHT hepatic lobe. No focal
hepatic parenchymal abnormality. Contracted gallbladder containing
gallstones. No pericholecystic edema or inflammation. No biliary
ductal dilation.

Pancreas: While difficult to visualize in its entirety, visualized
portions normal in appearance.

Spleen: Borderline to mild splenomegaly, the spleen measuring
approximately 14.8 x 5.1 x 16.1 cm, yielding a volume of
approximately 420 mL. No focal splenic parenchymal abnormality.

Adrenals/Urinary Tract: Normal appearing adrenal glands. Kidneys
normal in size and appearance without focal parenchymal abnormality.
No hydronephrosis. No evidence of urinary tract calculi. Urinary
bladder decompressed and tethered to anterior abdominal wall due to
scar from the prior colectomy.

Stomach/Bowel: Stomach normal in appearance for the degree of
distention. Mild wall thickening of several loops of jejunum and
possibly proximal ileum in the LEFT mid abdomen and LEFT side of the
pelvis. No significant adjacent edema/inflammation in the mesenteric
fat. Prior LEFT hemicolectomy with a proximal descending colostomy
in the LEFT mid abdomen. Expected stool burden in the remaining
colon. Appendix not conspicuous.

Vascular/Lymphatic: No visible aortoiliofemoral atherosclerosis.
Widely patent visceral arteries. Normal-appearing portal venous and
systemic venous systems. Note is made of a circumaortic LEFT renal
vein.

No pathologic lymphadenopathy.

Reproductive: Prostate gland and seminal vesicles normal in size and
appearance for age.

Other: Presacral soft tissue thickening and stranding, presumably
post-surgical scarring and prior radiation therapy (if performed),
unchanged in appearance since the 05/22/2019 CT. No discernible
enhancing mass.

Parastomal hernia containing a normal-appearing loop of small bowel.

Incidental note of minimal BILATERAL gynecomastia.

Musculoskeletal: Regional skeleton unremarkable without acute or
significant osseous abnormality.
IMPRESSION: 1. Mild wall thickening of several loops of jejunum and possibly
proximal ileum in the LEFT mid abdomen and LEFT side of the pelvis,
query mild enteritis/active Crohn's disease. No significant adjacent
edema/inflammation in the mesenteric fat.
2. Presacral soft tissue thickening and stranding, presumably
related to post-surgical scarring and post radiation fibrosis (if
the patient received prior radiation therapy), unchanged since the
05/22/2019 CT.
3. Descending colostomy in the LEFT mid abdomen. Parastomal hernia
containing a normal-appearing loop of small bowel.
4. Borderline to mild hepatosplenomegaly. Mildly irregular contour
of the inferior RIGHT hepatic lobe, query early changes of
cirrhosis. No focal hepatic parenchymal abnormality.
5. Cholelithiasis without evidence of acute cholecystitis.
